# Patient Record
Sex: Male | Born: 1940 | Race: White | Hispanic: No | Marital: Married | Smoking: Former smoker
Health system: Southern US, Community
[De-identification: ages and names within clinical notes are randomized; demographics above are authoritative.]

## PROBLEM LIST (undated history)

## (undated) DIAGNOSIS — I499 Cardiac arrhythmia, unspecified: Secondary | ICD-10-CM

## (undated) DIAGNOSIS — M779 Enthesopathy, unspecified: Secondary | ICD-10-CM

## (undated) DIAGNOSIS — E785 Hyperlipidemia, unspecified: Secondary | ICD-10-CM

## (undated) DIAGNOSIS — Z87442 Personal history of urinary calculi: Secondary | ICD-10-CM

## (undated) DIAGNOSIS — I498 Other specified cardiac arrhythmias: Secondary | ICD-10-CM

## (undated) DIAGNOSIS — N500 Atrophy of testis: Secondary | ICD-10-CM

## (undated) DIAGNOSIS — T8859XA Other complications of anesthesia, initial encounter: Secondary | ICD-10-CM

## (undated) DIAGNOSIS — N529 Male erectile dysfunction, unspecified: Secondary | ICD-10-CM

## (undated) DIAGNOSIS — Z860100 Personal history of colon polyps, unspecified: Secondary | ICD-10-CM

## (undated) DIAGNOSIS — U071 COVID-19: Secondary | ICD-10-CM

## (undated) DIAGNOSIS — T4145XA Adverse effect of unspecified anesthetic, initial encounter: Secondary | ICD-10-CM

## (undated) DIAGNOSIS — K635 Polyp of colon: Secondary | ICD-10-CM

## (undated) DIAGNOSIS — M1990 Unspecified osteoarthritis, unspecified site: Secondary | ICD-10-CM

## (undated) DIAGNOSIS — K5792 Diverticulitis of intestine, part unspecified, without perforation or abscess without bleeding: Secondary | ICD-10-CM

## (undated) DIAGNOSIS — D126 Benign neoplasm of colon, unspecified: Secondary | ICD-10-CM

## (undated) DIAGNOSIS — K37 Unspecified appendicitis: Secondary | ICD-10-CM

## (undated) DIAGNOSIS — K5720 Diverticulitis of large intestine with perforation and abscess without bleeding: Secondary | ICD-10-CM

## (undated) DIAGNOSIS — G7000 Myasthenia gravis without (acute) exacerbation: Secondary | ICD-10-CM

## (undated) DIAGNOSIS — R011 Cardiac murmur, unspecified: Secondary | ICD-10-CM

## (undated) DIAGNOSIS — K5790 Diverticulosis of intestine, part unspecified, without perforation or abscess without bleeding: Secondary | ICD-10-CM

## (undated) DIAGNOSIS — G709 Myoneural disorder, unspecified: Secondary | ICD-10-CM

## (undated) DIAGNOSIS — R002 Palpitations: Secondary | ICD-10-CM

## (undated) HISTORY — PX: HEMORRHOID SURGERY: SHX153

## (undated) HISTORY — DX: Unspecified osteoarthritis, unspecified site: M19.90

## (undated) HISTORY — DX: Myasthenia gravis without (acute) exacerbation: G70.00

## (undated) HISTORY — DX: Benign neoplasm of colon, unspecified: D12.6

## (undated) HISTORY — DX: Male erectile dysfunction, unspecified: N52.9

## (undated) HISTORY — DX: Diverticulosis of intestine, part unspecified, without perforation or abscess without bleeding: K57.90

## (undated) HISTORY — DX: Enthesopathy, unspecified: M77.9

## (undated) HISTORY — PX: FLEXIBLE SIGMOIDOSCOPY: SHX1649

## (undated) HISTORY — DX: COVID-19: U07.1

## (undated) HISTORY — DX: Palpitations: R00.2

## (undated) HISTORY — DX: Personal history of colonic polyps: Z86.010

## (undated) HISTORY — DX: Personal history of colon polyps, unspecified: Z86.0100

## (undated) HISTORY — DX: Polyp of colon: K63.5

## (undated) HISTORY — DX: Atrophy of testis: N50.0

---

## 2001-01-30 ENCOUNTER — Encounter (INDEPENDENT_AMBULATORY_CARE_PROVIDER_SITE_OTHER): Payer: Self-pay | Admitting: *Deleted

## 2001-01-30 ENCOUNTER — Ambulatory Visit (HOSPITAL_COMMUNITY): Admission: RE | Admit: 2001-01-30 | Discharge: 2001-01-30 | Payer: Self-pay | Admitting: Gastroenterology

## 2003-10-30 HISTORY — PX: INGUINAL HERNIA REPAIR: SHX194

## 2004-09-29 ENCOUNTER — Ambulatory Visit (HOSPITAL_COMMUNITY): Admission: RE | Admit: 2004-09-29 | Discharge: 2004-09-29 | Payer: Self-pay | Admitting: General Surgery

## 2006-10-29 HISTORY — PX: KNEE ARTHROSCOPY: SUR90

## 2008-10-29 HISTORY — PX: CARDIAC ELECTROPHYSIOLOGY MAPPING AND ABLATION: SHX1292

## 2010-08-17 ENCOUNTER — Ambulatory Visit (HOSPITAL_COMMUNITY): Admission: RE | Admit: 2010-08-17 | Discharge: 2010-08-17 | Payer: Self-pay | Admitting: Pediatrics

## 2010-12-06 ENCOUNTER — Encounter: Payer: Self-pay | Admitting: Internal Medicine

## 2010-12-19 ENCOUNTER — Encounter: Payer: Self-pay | Admitting: Internal Medicine

## 2011-01-11 ENCOUNTER — Encounter: Payer: Self-pay | Admitting: Internal Medicine

## 2011-01-11 ENCOUNTER — Ambulatory Visit (INDEPENDENT_AMBULATORY_CARE_PROVIDER_SITE_OTHER): Payer: Medicare Other | Admitting: Internal Medicine

## 2011-01-11 ENCOUNTER — Other Ambulatory Visit: Payer: Self-pay | Admitting: Internal Medicine

## 2011-01-11 DIAGNOSIS — I498 Other specified cardiac arrhythmias: Secondary | ICD-10-CM

## 2011-01-11 DIAGNOSIS — I471 Supraventricular tachycardia: Secondary | ICD-10-CM

## 2011-01-11 DIAGNOSIS — Z01810 Encounter for preprocedural cardiovascular examination: Secondary | ICD-10-CM

## 2011-01-11 HISTORY — DX: Other specified cardiac arrhythmias: I49.8

## 2011-01-11 LAB — BASIC METABOLIC PANEL
BUN: 15 mg/dL (ref 6–23)
CO2: 27 mEq/L (ref 19–32)
Calcium: 9.2 mg/dL (ref 8.4–10.5)
Chloride: 108 mEq/L (ref 96–112)
Creatinine, Ser: 1 mg/dL (ref 0.4–1.5)
GFR: 81.01 mL/min (ref >60.00)
Glucose, Bld: 81 mg/dL (ref 70–99)
Potassium: 4 mEq/L (ref 3.5–5.1)
Sodium: 142 mEq/L (ref 135–145)

## 2011-01-11 LAB — CBC WITH DIFFERENTIAL/PLATELET
Basophils Absolute: 0 10*3/uL (ref 0.0–0.1)
Basophils Relative: 0.2 % (ref 0.0–3.0)
Eosinophils Absolute: 0.2 10*3/uL (ref 0.0–0.7)
Eosinophils Relative: 2.9 % (ref 0.0–5.0)
HCT: 43.6 % (ref 39.0–52.0)
Hemoglobin: 14.9 g/dL (ref 13.0–17.0)
Lymphocytes Relative: 22.2 % (ref 12.0–46.0)
Lymphs Abs: 1.8 10*3/uL (ref 0.7–4.0)
MCHC: 34.2 g/dL (ref 30.0–36.0)
MCV: 89 fl (ref 78.0–100.0)
Monocytes Absolute: 0.6 10*3/uL (ref 0.1–1.0)
Monocytes Relative: 8.1 % (ref 3.0–12.0)
Neutro Abs: 5.4 10*3/uL (ref 1.4–7.7)
Neutrophils Relative %: 66.6 % (ref 43.0–77.0)
Platelets: 251 10*3/uL (ref 150.0–400.0)
RBC: 4.9 Mil/uL (ref 4.22–5.81)
RDW: 14.3 % (ref 11.5–14.6)
WBC: 8 10*3/uL (ref 4.5–10.5)

## 2011-01-11 LAB — PROTIME-INR
INR: 1.1 ratio — ABNORMAL HIGH (ref 0.8–1.0)
Prothrombin Time: 12.7 s — ABNORMAL HIGH (ref 10.2–12.4)

## 2011-01-11 LAB — APTT: aPTT: 29.2 s — ABNORMAL HIGH (ref 21.7–28.8)

## 2011-01-15 ENCOUNTER — Observation Stay (HOSPITAL_COMMUNITY)
Admission: RE | Admit: 2011-01-15 | Discharge: 2011-01-15 | Disposition: A | Discharge status: Home / Self Care | Payer: Medicare Other | Source: Ambulatory Visit | Attending: Internal Medicine | Admitting: Internal Medicine

## 2011-01-15 DIAGNOSIS — M1710 Unilateral primary osteoarthritis, unspecified knee: Secondary | ICD-10-CM | POA: Insufficient documentation

## 2011-01-15 DIAGNOSIS — Z7982 Long term (current) use of aspirin: Secondary | ICD-10-CM | POA: Insufficient documentation

## 2011-01-15 DIAGNOSIS — K5730 Diverticulosis of large intestine without perforation or abscess without bleeding: Secondary | ICD-10-CM | POA: Insufficient documentation

## 2011-01-15 DIAGNOSIS — N529 Male erectile dysfunction, unspecified: Secondary | ICD-10-CM | POA: Insufficient documentation

## 2011-01-15 DIAGNOSIS — I471 Supraventricular tachycardia: Secondary | ICD-10-CM

## 2011-01-15 DIAGNOSIS — E785 Hyperlipidemia, unspecified: Secondary | ICD-10-CM | POA: Insufficient documentation

## 2011-01-15 DIAGNOSIS — Z79899 Other long term (current) drug therapy: Secondary | ICD-10-CM | POA: Insufficient documentation

## 2011-01-15 DIAGNOSIS — I498 Other specified cardiac arrhythmias: Principal | ICD-10-CM | POA: Insufficient documentation

## 2011-01-16 ENCOUNTER — Telehealth: Payer: Self-pay | Admitting: Internal Medicine

## 2011-01-16 NOTE — Assessment & Plan Note (Signed)
Summary: np6/paroxysmal svt/medicare/dr.varanasi per linda [id number]/mj   Visit Type:  Initial Consult  CC:  Parosymal svt.  History of Present Illness: [name] is referred today by Dr. Eldridge Dace for evaluation and treatment of SVT. He has had a several year h/o palpitations and has had document episodes of SVT at nearly 200 beats a minute. The patient has never had frank syncope but does not dizziness just before his episodes occur. He has minimal chest pressure and sob. No edema. He has been treated with metoprolol with no improvement and he feels fatigue on metoprolol. No other complaints today.    Current Medications (verified): 1)  Metoprolol Succinate 50 Mg Xr24h-Tab (Metoprolol Succinate) .... Take One Tablet By Mouth Daily 2)  Omeprazole 20 Mg Tbec (Omeprazole) .... Take 1 Tablet By Mouth Once A Day 3)  Aspirin 81 Mg Tbec (Aspirin) .... Take One Tablet By Mouth Daily 4)  Multivitamins  Tabs (Multiple Vitamin) .... Take 1 Tablet By Mouth Once A Day 5)  Fish Oil 1000 Mg Caps (Omega-3 Fatty Acids) .... Take 1 Capsule By Mouth Once A Day 6)  Calcium 600+d Plus Minerals 600-400 Mg-Unit Tabs (Calcium Carbonate-Vit D-Min) .... Take 1 Tablet By Mouth Once A Day 7)  Glucosamine Chondroitin Complx  Caps (Glucosamine-Chondroit-Vit C-Mn) .... 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 Sikora Right testicular atrophy erectile dysfunction colon polyp history extensive diverticulosis 9/11 monitor for palpitation with SVT rate up to 150  Family History: Last updated: 01/11/2011 No premature CAD  Social History: Last updated: 01/10/2011 former smoker-quit 9 years ago alcohol yes caffeine yes no drugs excercise- walks occupation -Airline pilot retired  Marital  status- marrried Radiographer, therapeutic  Family History: No premature CAD  Review of Systems       All systems reviewed and negative except as noted in the HPI.  Vital Signs:  Patient profile:   70 year old male Height:      70 inches Weight:      205.50 pounds BMI:     29.59 Pulse rate:   69 / minute Pulse rhythm:   regular Resp:     18 per minute BP sitting:   110 / 72  (right arm) Cuff size:   large  Vitals Entered By: Vikki Ports (January 11, 2011 2:18 PM)  Physical Exam  General:  Well developed, well nourished man, in no acute distress.  HEENT: normal Neck: supple. No JVD. Carotids 2+ bilaterally no bruits Cor: RRR no rubs, gallops or murmur Lungs: CTA Ab: soft, nontender. nondistended. No HSM. Good bowel sounds Ext: warm. no cyanosis, clubbing or edema Neuro: alert and oriented. Grossly nonfocal. affect pleasant    EKG  Procedure date:  01/11/2011  Findings:      Normal sinus rhythm with rate of:  69.  Impression & Recommendations:  Problem # 1:  SUPRAVENTRICULAR TACHYCARDIA (ICD-427.89) I have discussed the treatment options and the risks/benefits/goals/expectations of EPS/RFA of SVT have been discussed with the patient and he wishes to proceed. His updated medication list for this problem includes:    Metoprolol Succinate 50 Mg Xr24h-tab (Metoprolol succinate) .Marland Kitchen... Take one tablet by mouth daily    Aspirin 81 Mg Tbec (Aspirin) .Marland Kitchen... Take one tablet by mouth daily  Orders: EKG w/ Interpretation (93000) TLB-BMP (Basic Metabolic Panel-BMET) (80048-METABOL) TLB-CBC  Platelet - w/Differential (85025-CBCD) TLB-PT (Protime) (85610-PTP) TLB-PTT (85730-PTTL)  Patient Instructions: 1)  Your physician recommends that you return for lab work in: TODAY BMET CBC PT PTT V72.81 2)  Your physician recommends that you continue on your current medications as directed. Please refer to the Current Medication list given to you today. 3)  Your physician has recommended that you  have an ablation.  Catheter ablation is a medical procedure used to treat some cardiac arrhythmias (irregular heartbeats). During catheter ablation, a long, thin, flexible tube is put into a blood vessel in your groin (upper thigh), or neck. This tube is called an ablation catheter. It is then guided to your heart through the blood vessel. Radiofrequency waves destroy small areas of heart tissue where abnormal heartbeats may cause an arrhythmia to start.  Please see the instruction sheet given to you today. 4)  You have been diagnosed with SVT (supraventricular tachycardia).  SVT is a rapid heartbeat that begins in the upper chambers of the heart. This is usually not a life threatening condition. Please see the handout/brochure given to you today for more information.

## 2011-01-16 NOTE — Telephone Encounter (Signed)
Called and spoke with patient.  He is having some rapid heart rates at night.  I explained to him that this is normal after an ablation to have some episodes during healing.  He has a follow up appointment 02/15/11.  He will call me back if it starts to happen during the day and we can restart the Metoprolol during the healing phase

## 2011-01-16 NOTE — Consult Note (Signed)
Summary: Lendon Colonel   Imported By: Marylou Mccoy 01/10/2011 16:30:00  _____________________________________________________________________  External Attachment:    Type:   Image     Comment:   External Document

## 2011-01-16 NOTE — Letter (Signed)
Summary: ELectrophysiology/Ablation Procedure Instructions  [facility], Main Office  [address]   [place], [place] [address]   Phone: [phone]  Fax: [phone]     Electrophysiology/Ablation Procedure Instructions    You are scheduled for a(n) ______SVT ABLATION _________ on ________3/19/12___ at ______7:30 AM______ with Dr. ___TAYLOR___________.  1.  Please come to the Short Stay Center at [facility] at ___5:30 AM______ on the day of your procedure.  2.  Come prepared to stay overnight.   Please bring your insurance cards and a list of your medications.  3.  Come to the [place] office on ______TODAY______ for lab work.  The lab at [facility] is open from 8:30 AM to 1:30 PM and 2:30 PM to 5:00 PM.  The lab at [facility] is open from 7:30 AM to 5:30 PM.  You do not have to be fasting.  4.  Do not have anything to eat or drink after midnight the night before your procedure.  5.  Do NOT take METOPROLOL 50 MG 2  days before your procedure unless otherwise instructed. All of your remaining medications may be taken with a small amount of water.  6.  Educational material received:  _____ EP   ____X_ Ablation   * Occasionally, EP studies and ablations can become lengthy.  Please make your family aware of this before your procedure starts.  Average time ranges from 2-8 hours for EP studies/ablations.  Your physician will locate your family after the procedure with the results.  * If you have any questions after you get home, please call the office at [phone].

## 2011-01-22 ENCOUNTER — Encounter: Payer: Self-pay | Admitting: Internal Medicine

## 2011-02-02 NOTE — Op Note (Signed)
[name], [name]              ACCOUNT NO.:  [id number]  MEDICAL RECORD NO.:  [id number]           PATIENT TYPE:  I  LOCATION:  3742                         FACILITY:  [name]  PHYSICIAN:  Doylene Canning. Ladona Ridgel, MD    DATE OF BIRTH:  January 11, 1941  DATE OF PROCEDURE:  01/15/2011 DATE OF DISCHARGE:  01/15/2011                              OPERATIVE REPORT   PROCEDURE PERFORMED:  Electrophysiologic study and radiofrequency catheter ablation of atrioventricular node reentrant tachycardia.  INTRODUCTION:  The patient is a 70 year old man who has a history of longstanding tachy palpitations refractory to medical therapy with beta- blockers.  The patient has had ventricular rates of over 150 beats per minute.  He is now referred for catheter ablation.  PROCEDURE:  After informed consent was obtained, the patient was taken to the diagnostic EP lab in fasting state.  After usual preparation and draping, intravenous fentanyl and midazolam were given for sedation.  A 6-French hexapolar catheter was inserted percutaneously into the right jugular vein and advanced to the coronary sinus.  A 6-French quadripolar catheter was inserted percutaneously into the right femoral vein and advanced to the right ventricle.  A 6-French quadripolar catheter was inserted percutaneously in the right femoral vein and advanced to His bundle region.  After measurement of basic intervals, rapid ventricular pacing was carried out from the right ventricle at a pacing cycle length of 600 milliseconds and stepwise decreased down to 350 milliseconds where VA Wenckebach was observed.  During rapid ventricular pacing, the atrial activation was midline and decremental.  Next, programmed ventricular stimulation was carried out from the right ventricle at base drive cycle length of 284 milliseconds.  The S1-S2 interval was stepwise decreased down to 330 milliseconds where ventricular refractoriness was observed.  During  programmed ventricular dilation, the activation sequence in the atrium was midline and decremental.  There were multiple VA jumps and VA echo beats.  Next, programmed atrial stimulation was carried out from the coronary sinus at a base drive cycle length of 132 milliseconds and stepwise decreased down to 330 milliseconds resulting in the initiation of SVT.  During SVT, the VA interval was very short and the atrial activation was midline.  PVCs placed at the time of His bundle refractoriness did not preexcite the atrium and during ventricular pacing, there was VAAV conduction sequence.  Next, rapid atrial pacing was carried out from the coronary sinus and stepwise decreased down to 330 milliseconds resulting in the initiation of SVT. During rapid atrial pacing, the PR interval was initially greater than the RR interval.  The patient's SVT could be readily terminated by rapid atrial pacing at 300-320 milliseconds.  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  The 7-French quadripolar ablation catheter was then inserted percutaneously into the right femoral vein and advanced under fluoroscopic guidance into the right atrium.  Mapping of [name] triangle was carried out.  [name] triangle was unusually small.  In addition, the coronary sinus was superiorly displaced.  This made Koch triangle even smaller.  A total of 5 RF energy applications were delivered to [name] triangle sites 7  and 8 and during RF energy application, there was prolonged accelerated junctional rhythm.  After the fifth RF energy application, rapid atrial pacing failed to demonstrate any residual SVT and there were no residual slow pathway conduction.  After an observation of 1 hour where there was no recurrent SVT and no inducible SVT, the catheters were removed, hemostasis was assured, and the patient was returned to his room in satisfactory condition.  COMPLICATIONS:  There were no immediate  procedure complications.  RESULTS:  a.  Baseline ECG:  The baseline ECG demonstrates sinus rhythm with normal axis and intervals. b.  Baseline intervals:  The sinus node cycle length was 945 milliseconds, the QRS duration was 90 milliseconds, the HV interval was 55 milliseconds, and the AH interval was 81 milliseconds. c.  Rapid ventricular pacing:  Rapid ventricular pacing was carried out from the right ventricle and stepwise decreased down to 350 milliseconds where VA Wenckebach was observed.  During rapid ventricular pacing, the atrial activation sequence was midline and decremental. d.  Deep programmed ventricular stimulation:  Programmed ventricular stimulation was carried out from the right ventricle at base drive cycle length of 563 milliseconds.  The S1 and S2 interval stepwise decreased down to 260 milliseconds where ventricular refractoriness was observed. During programmed ventricular stimulation, the atrial activation sequence was midline and decremental. e.  Rapid atrial pacing:  Rapid atrial pacing was carried out from the coronary sinus and high right atrium pace at base drive cycle length of 875 milliseconds and stepwise decreased down to 360 milliseconds resulting in the initiation of SVT.  Following ablation, rapid atrial pacing demonstrated an AV Wenckebach cycle length varying between 390 and 420 milliseconds.  Prior to ablation, the PR interval was greater than the RR interval.  After ablation, the PR interval was less than the RR interval. f.  Programmed atrial stimulation:  Programmed atrial stimulation was carried out from the coronary sinus and high right atrium at base drive cycle length of 643 milliseconds and 500 milliseconds.  The S1 and S2 interval stepwise decreased down to 330 milliseconds where SVT was initiated.  Following ablation, the S1 and S2 interval was stepwise decreased down to 330 milliseconds where the AV node ERP was observed. Prior to  ablation, there were multiple AH jumps and echo beats along with inducible SVT.  During programmed atrial stimulation and following ablation, there were no residual echo beats and the PR interval was less than the RR interval and there was no inducible SVT. g.  Arrhythmias observed:  AV node reentry tachycardia initiation was with programmed atrial stimulation and rapid atrial pacing.  The duration was sustained.  Cycle length was 380 milliseconds.  Method of initiation with rapid atrial pacing and programmed atrial stimulation, duration was sustained.  Termination was with rapid atrial pacing. h.  Mapping.  Mapping of [name] triangle demonstrated an usually small and anteriorly displaced [employer]. 1. RF energy application:  A total of 5 RF energy applications were     delivered to sites 7 and 8 in [place] triangle resulting in     accelerated junctional rhythm and rendering the tachycardia     noninducible.  There was no residual slow pathway conduction.  CONCLUSION:  The study demonstrates successful electrophysiologic study and RF catheter ablation of AV node reentrant tachycardia with a total of 5 RF energy applications delivered to sites 7 and 8 in [place] triangle. Following catheter ablation, there was no inducible SVT nor was there any residual evidence of any  slow pathway conduction.     Doylene Canning. Ladona Ridgel, MD     GWT/MEDQ  D:  01/15/2011  T:  01/16/2011  Job:  [id number]  cc:   Corky Crafts, MD  Electronically Signed by Lewayne Bunting MD on 02/02/2011 06:47:17 AM

## 2011-02-14 ENCOUNTER — Encounter: Payer: Self-pay | Admitting: Internal Medicine

## 2011-02-14 ENCOUNTER — Encounter: Payer: Self-pay | Admitting: *Deleted

## 2011-02-15 ENCOUNTER — Encounter: Payer: Self-pay | Admitting: Internal Medicine

## 2011-02-15 ENCOUNTER — Ambulatory Visit (INDEPENDENT_AMBULATORY_CARE_PROVIDER_SITE_OTHER): Payer: Medicare Other | Admitting: Internal Medicine

## 2011-02-15 DIAGNOSIS — I498 Other specified cardiac arrhythmias: Secondary | ICD-10-CM

## 2011-02-15 DIAGNOSIS — R002 Palpitations: Secondary | ICD-10-CM

## 2011-02-15 NOTE — Progress Notes (Signed)
HPI [name] today for followup. He is a very pleasant 70 year old man with a history of SVT who underwent catheter ablation several weeks ago. Today he complains that every night he'll wake up and feel like his heart is racing. It stops within a few deep breaths and has not returned. He has had no daytime symptoms. He denies chest pain or shortness of breath. No syncope. He states that during the day it will feel like his heart is about to start racing but never does. Allergies not on file   Current Outpatient Prescriptions  Medication Sig Dispense Refill  . aspirin 81 MG EC tablet Take 81 mg by mouth daily.        . Calcium Carbonate-Vitamin D (CALCIUM 600+D) 600-400 MG-UNIT per tablet Take 1 tablet by mouth daily.        . fish oil-omega-3 fatty acids 1000 MG capsule Take 1 g by mouth daily.        Marland Kitchen GLUCOSAMINE-CHONDROITIN-VIT C PO Take 1 tablet by mouth daily.        . Multiple Vitamin (MULTIVITAMIN) tablet Take 1 tablet by mouth daily.        Marland Kitchen omeprazole (PRILOSEC) 20 MG capsule Take 20 mg by mouth daily.        Marland Kitchen DISCONTD: metoprolol (TOPROL-XL) 50 MG 24 hr tablet Take 50 mg by mouth daily.           Past Medical History  Diagnosis Date  . Osteoarthritis   . Tendinitis     achiles heel spur  . Testicular atrophy   . ED (erectile dysfunction)   . History of colon polyps   . Diverticulosis     extensive  . Palpitation     with svt rate 150    ROS:   All systems reviewed and negative except as noted in the HPI.   No past surgical history on file.   No family history on file.   History   Social History  . Marital Status: Married    Spouse Name: N/A    Number of Children: N/A  . 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 Smoking status: Former Smoker    Quit date: 02/13/2002  . Smokeless tobacco: Former Neurosurgeon  . Alcohol Use: Yes  . Drug Use: No  . Sexually Active: Not on file   Other Topics  Concern  . Not on file   Social History Narrative  . No narrative on file     BP 116/80  Pulse 83  Ht 6' (1.829 m)  Wt 203 lb (92.08 kg)  BMI 27.53 kg/m2  Physical Exam:  Well appearing NAD HEENT: Unremarkable Neck:  No JVD, no thyromegally Lymphatics:  No adenopathy Back:  No CVA tenderness Lungs:  Clear. HEART:  Regular rate rhythm, no murmurs, no rubs, no clicks Abd:  Flat, positive bowel sounds, no organomegally, no rebound, no guarding Ext:  2 plus pulses, no edema, no cyanosis, no clubbing Skin:  No rashes no nodules Neuro:  CN II through XII intact, motor grossly intact  EKG Normal sinus rhythm with incomplete right bundle branch block. Nonspecific T wave abnormality.  Assess/Plan:

## 2011-02-15 NOTE — Assessment & Plan Note (Signed)
No demonstrated recurrent SVT. I recommended a period of watchful waiting. He is no longer on a beta blocker. We'll follow

## 2011-02-15 NOTE — Patient Instructions (Signed)
Your physician recommends that you schedule a follow-up appointment as needed  

## 2011-02-15 NOTE — Assessment & Plan Note (Signed)
The patient continues to have nocturnal palpitations. It is unclear to me whether this represents recurrent tachycardia or something else. I discussed the possibility of wearing a cardiac monitor at the present time he feels like this would not be necessary. If he has any daytime palpitations of significance I have asked him to call us and we would recommend him wearing a monitor.

## 2011-02-26 NOTE — Discharge Summary (Signed)
  NAMEZAMEER, BORMAN              ACCOUNT NO.:  [id number]  MEDICAL RECORD NO.:  [id number]           PATIENT TYPE:  I  LOCATION:  3742                         FACILITY:  [facility]  PHYSICIAN:  Doylene Canning. Ladona Ridgel, MD    DATE OF BIRTH:  1941-05-25  DATE OF ADMISSION:  01/15/2011 DATE OF DISCHARGE:  01/15/2011                              DISCHARGE SUMMARY   PRIMARY CARDIOLOGIST:  Corky Crafts, MD  ELECTROPHYSIOLOGIST:  Doylene Canning. 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  SECONDARY DIAGNOSES: 1. Hyperlipidemia. 2. Erectile dysfunction. 3. Osteoarthritis of the knees. 4. History of Achilles tendonitis. 5. Right testicular atrophy. 6. Diverticulosis. 7. Remote tobacco abuse.  ALLERGIES:  No known drug allergies.  PROCEDURES:  Electrophysiologic study notable for inducible supraventricular tachycardia/AV nodal reentrant tachycardia and subsequent successful slow pathway ablation.  HISTORY OF PRESENT ILLNESS:  A 70 year old male with history of palpitations followed by Dr. Eldridge Dace who has recently undergone monitoring showing SVT.  Subsequently referred to see Dr. Ladona Ridgel on January 11, 2011, and decision was made to pursue SVT ablation.  HOSPITAL COURSE:  The patient presented to the [facility] EP lab on January 15, 2011, where he underwent electrophysiologic study with successful identification of supraventricular tachycardia/AV nodal reentrant tachycardia.  The patient then underwent successful slow pathway ablation with 5 radiofrequency delivered energies.  The patient has maintained sinus rhythm and overall has tolerated procedure well. We are discontinuing his home beta-blocker and he will be discharged home this evening in good condition.  DISCHARGE LABS:  Hemoglobin 14.9, hematocrit 43.6, WBC 8.0, platelets 251, INR 1.1.  Sodium 142, potassium 4.0, chloride 108, CO2 of 27, BUN 15, creatinine 1.0,  glucose 81, calcium 9.2.  DISPOSITION:  The patient will be discharged home today in good condition.  FOLLOWUP PLANS AND APPOINTMENTS:  We will arrange followup with Dr. Ladona Ridgel in approximately 4 weeks.  He will follow up with Dr. Eldridge Dace as scheduled.  DISCHARGE MEDICATIONS: 1. Aspirin 81 mg daily. 2. Calcium plus D 1 tablet daily. 3. Fish oil over the counter 1 capsule daily. 4. Glucosamine/chondroitin 1 capsule daily. 5. Multivitamin daily. 6. Omeprazole 20 mg daily.  OUTSTANDING LABS AND STUDIES:  None.  Duration of discharge encounter 35 minutes including physician time.     Nicolasa Ducking, ANP   ______________________________ Doylene Canning. Ladona Ridgel, MD    CB/MEDQ  D:  01/15/2011  T:  01/16/2011  Job:  [id number]  cc:   Corky Crafts, MD  Electronically Signed by Nicolasa Ducking ANP on 02/06/2011 04:03:38 PM Electronically Signed by Lewayne Bunting MD on 02/26/2011 07:57:29 AM

## 2011-03-16 NOTE — Op Note (Signed)
[name], [name] NO.:  [id number]   MEDICAL RECORD NO.:  [id number]          PATIENT TYPE:  OIB   LOCATION:  2899                         FACILITY:  [facility]   PHYSICIAN:  Leonie Man, M.D.   DATE OF BIRTH:  [birth year]   DATE OF PROCEDURE:  09/29/2004  DATE OF DISCHARGE:  09/29/2004                                 OPERATIVE REPORT   PREOPERATIVE DIAGNOSIS:  Bilateral inguinal hernias.   POSTOPERATIVE DIAGNOSIS:  Bilateral inguinal hernias.   PROCEDURES:  Repair of bilateral inguinal hernias with mesh.   SURGEON:  Leonie Man, M.D.   ASSISTANT:  Nurse.   ANESTHESIA:  General.   INDICATIONS:  [name] is a 70 year old gentleman presenting with a right-  sided inguinal hernia and on evaluation noted to have bilateral inguinal  hernias.  He comes to the operating room after the risks and potential  benefits of surgery have been fully discussed.  All questions have been  answered and consent for surgery obtained.   DESCRIPTION OF PROCEDURE:  Following the induction of satisfactory general  anesthesia with the patient positioned supinely, the abdomen was prepped and  draped to be included in the sterile operative field.  I outlined incisions  in both the right and left groin and infiltrated the right groin with 0.5%  Marcaine with epinephrine.  A transverse incision in the lower abdominal  crease was carried down through the skin and subcutaneous tissues,  dissecting down to the external oblique aponeurosis.  This was opened up  through the external inguinal ring with protection of the ilioinguinal  nerve.  The spermatic cord was elevated and held with a Penrose drain.  A  large direct hernia sac and cord lipoma was dissected free from the cord and  carried up to the internal ring.  The sac, as well as the lipoma are clamped  and suture ligated with 2-0 silk sutures.  The floor of the inguinal canal  was then repaired with an onlay patch of  polypropylene mesh sewn in at the  pubic tubercle and carried up along the conjoined tendon with a 2-0 Novofil  and again from the pubic tubercle along the shelving edge of Poupart's  ligament up to the internal ring.  The mesh was split so as to allow normal  protrusion of the cord through the newly formed internal ring.  The mesh was  then sutured down in behind the cord and the internal oblique muscles.  The  area of dissection was checked for hemostasis and noted to be dry.  Sponge  and instrument counts were verified.  The external oblique aponeurosis was  closed over the cord so as to reapproximate the external ring.  This was  accomplished with a running 2-0 Vicryl suture.  Scarpa's fascia was closed  with a running 3-0 Vicryl suture and the skin was closed with a running 4-0  Monocryl suture.   Attention was then turned to the left side where a symmetrically placed  incision was made and deepened through the skin and subcutaneous tissue,  dissecting down to the external oblique aponeurosis.  Again this was opened  up through the external inguinal ring with protection of the ilioinguinal  nerve.  The spermatic cord was elevated.  A similar hernia was noted on the  left side which was dissected away from the cord and carried up to the  internal ring.  The lipoma and the sac were dissected free, cross clamped  and suture ligated with 2-0 silk sutures.  The floor of the inguinal canal  was then repaired with a patch of polypropylene mesh which was sewn in at  the pubic tubercle with 2-0 Novofil carrying the running suture up along the  conjoined tendon to the internal ring and again from the pubic tubercle up  along the shelving edge of Poupart's ligament to the internal ring.  The  mesh was split, therefore allowing the cord to protrude through the newly  formed internal ring.  Tails of the mesh were sutured down behind the cord.  The hernia repair was noted to be intact.  Sponge and  instrument counts were  verified.  The external oblique aponeurosis was closed over the cord with a  running 2-0 Vicryl suture.  The Scarpa's fascia was closed with a running 3-  0 Vicryl suture.  The skin was closed with a running 4-0 Monocryl suture.  Both wounds were then reinforced with Steri-Strips.  Sterile dressings were  applied.  The anesthetic was reversed.  The patient was removed from the  operating room to the recovery room in stable condition.  He tolerated the  procedure well.      Patr   PB/MEDQ  D:  09/29/2004  T:  10/01/2004  Job:  [id number]   cc:   Maryla Morrow. Modesto Charon, M.D.  [address]  [place]  [place] [address]  Fax: [phone]

## 2011-03-16 NOTE — Procedures (Signed)
[place]  Patient:    [name], [name]                       MRN: [id number] Proc. Date: 01/30/01 Attending:  Verlin Grills, M.D. CC:         Redmond Baseman, M.D.   Procedure Report  DATE OF BIRTH:  05/05/1941  REFERRING PHYSICIAN:  Redmond Baseman, M.D.  PROCEDURE PERFORMED:  Colonoscopy.  ENDOSCOPIST:  Verlin Grills, M.D.  INDICATIONS FOR PROCEDURE:  The patient is a 70 year old male.  On July 02, 1996, [name] underwent a diagnostic colonoscopy at [facility] to evaluate painless hematochezia.  From the midrectum, a 4 mm tubular adenomatous polyp was removed.  From the sigmoid colon, three 1 mm hyperplastic polyps were removed.  [name] is due for surveillance colonoscopy and polypectomy to prevent colon cancer.  I discussed with the patient the complications associated with colonoscopy and polypectomy including intestinal bleeding and intestinal perforation.  The patient has signed the operative permit.  PREMEDICATION:  Fentanyl 50 mcg, Versed 6 mg.  ENDOSCOPE:  Olympus pediatric video colonoscope.  DESCRIPTION OF PROCEDURE:  After obtaining informed consent, the patient was placed in the left lateral decubitus position.  I administered intravenous Demerol and intravenous Versed to achieve conscious sedation for the procedure.  The patients blood pressure, oxygen saturation and cardiac rhythm were monitored throughout the procedure and documented in the medical record.  Anal inspection was normal.  Digital rectal exam revealed an enlarged but non-nodular prostate.  The Olympus pediatric video colonoscope was then introduced into the rectum and under direct vision, easily advanced to the cecum as identified by a normal-appearing ileocecal valve.  Colonic preparation for the exam today was excellent.  Rectum and sigmoid colon:  From the midrectum, a 0.5 mm sessile  polyp was removed with the hot biopsy forceps.  From the distal sigmoid colon, at 25 cm from the anal verge, a 1 mm sessile polyp was removed with the hot biopsy forceps.  Both polyps were submitted in one bottle for pathological evaluation.  Sigmoid colonic diverticulosis present.  Descending colon:  Colonic diverticulosis.  Splenic flexure:  Normal.  Transverse colon:  Normal.  Hepatic flexure:  Normal.  Ascending colon:  Normal.  Cecum and ileocecal valve:  Normal.  ASSESSMENT: 1. Extensive left colonic diverticulosis. 2. From the distal sigmoid colon, at 25 cm from the anal verge, a 1 mm    sessile polyp was removed with the hot biopsy forceps; from the midrectum    a 0.5 mm sessile polyp was removed with the hot biopsy forceps.  RECOMMENDATIONS:  Repeat colonoscopy in April 2007. DD:  01/30/01 TD:  01/30/01 Job: [id number]

## 2011-03-23 ENCOUNTER — Encounter: Payer: Self-pay | Admitting: Internal Medicine

## 2011-10-18 ENCOUNTER — Other Ambulatory Visit: Payer: Self-pay | Admitting: Family Medicine

## 2011-10-18 DIAGNOSIS — R222 Localized swelling, mass and lump, trunk: Secondary | ICD-10-CM

## 2011-10-19 ENCOUNTER — Ambulatory Visit
Admission: RE | Admit: 2011-10-19 | Discharge: 2011-10-19 | Disposition: A | Discharge status: Home / Self Care | Payer: Medicare Other | Source: Ambulatory Visit | Attending: Family Medicine | Admitting: Family Medicine

## 2011-10-19 DIAGNOSIS — R222 Localized swelling, mass and lump, trunk: Secondary | ICD-10-CM

## 2011-10-24 ENCOUNTER — Other Ambulatory Visit: Payer: Self-pay | Admitting: Family Medicine

## 2011-10-24 DIAGNOSIS — N6310 Unspecified lump in the right breast, unspecified quadrant: Secondary | ICD-10-CM

## 2011-11-06 ENCOUNTER — Ambulatory Visit
Admission: RE | Admit: 2011-11-06 | Discharge: 2011-11-06 | Disposition: A | Discharge status: Home / Self Care | Payer: Medicare Other | Source: Ambulatory Visit | Attending: Family Medicine | Admitting: Family Medicine

## 2011-11-06 DIAGNOSIS — N6310 Unspecified lump in the right breast, unspecified quadrant: Secondary | ICD-10-CM

## 2011-11-06 DIAGNOSIS — N63 Unspecified lump in unspecified breast: Secondary | ICD-10-CM | POA: Diagnosis not present

## 2012-06-02 DIAGNOSIS — R002 Palpitations: Secondary | ICD-10-CM | POA: Diagnosis not present

## 2012-06-02 DIAGNOSIS — E782 Mixed hyperlipidemia: Secondary | ICD-10-CM | POA: Diagnosis not present

## 2012-06-10 DIAGNOSIS — L821 Other seborrheic keratosis: Secondary | ICD-10-CM | POA: Diagnosis not present

## 2012-06-10 DIAGNOSIS — L570 Actinic keratosis: Secondary | ICD-10-CM | POA: Diagnosis not present

## 2012-06-15 DIAGNOSIS — R350 Frequency of micturition: Secondary | ICD-10-CM | POA: Diagnosis not present

## 2012-06-15 DIAGNOSIS — N400 Enlarged prostate without lower urinary tract symptoms: Secondary | ICD-10-CM | POA: Diagnosis not present

## 2012-07-07 DIAGNOSIS — Z86010 Personal history of colonic polyps: Secondary | ICD-10-CM | POA: Diagnosis not present

## 2012-07-07 DIAGNOSIS — Z125 Encounter for screening for malignant neoplasm of prostate: Secondary | ICD-10-CM | POA: Diagnosis not present

## 2012-07-07 DIAGNOSIS — I498 Other specified cardiac arrhythmias: Secondary | ICD-10-CM | POA: Diagnosis not present

## 2012-07-07 DIAGNOSIS — N400 Enlarged prostate without lower urinary tract symptoms: Secondary | ICD-10-CM | POA: Diagnosis not present

## 2012-07-07 DIAGNOSIS — E785 Hyperlipidemia, unspecified: Secondary | ICD-10-CM | POA: Diagnosis not present

## 2012-07-07 DIAGNOSIS — Z23 Encounter for immunization: Secondary | ICD-10-CM | POA: Diagnosis not present

## 2012-07-07 DIAGNOSIS — Z79899 Other long term (current) drug therapy: Secondary | ICD-10-CM | POA: Diagnosis not present

## 2012-09-26 DIAGNOSIS — J069 Acute upper respiratory infection, unspecified: Secondary | ICD-10-CM | POA: Diagnosis not present

## 2012-09-29 DIAGNOSIS — L299 Pruritus, unspecified: Secondary | ICD-10-CM | POA: Diagnosis not present

## 2012-10-09 DIAGNOSIS — H04129 Dry eye syndrome of unspecified lacrimal gland: Secondary | ICD-10-CM | POA: Diagnosis not present

## 2012-10-09 DIAGNOSIS — H2510 Age-related nuclear cataract, unspecified eye: Secondary | ICD-10-CM | POA: Diagnosis not present

## 2012-11-03 DIAGNOSIS — Z125 Encounter for screening for malignant neoplasm of prostate: Secondary | ICD-10-CM | POA: Diagnosis not present

## 2012-11-03 DIAGNOSIS — Z1331 Encounter for screening for depression: Secondary | ICD-10-CM | POA: Diagnosis not present

## 2012-11-03 DIAGNOSIS — E785 Hyperlipidemia, unspecified: Secondary | ICD-10-CM | POA: Diagnosis not present

## 2012-11-03 DIAGNOSIS — R030 Elevated blood-pressure reading, without diagnosis of hypertension: Secondary | ICD-10-CM | POA: Diagnosis not present

## 2012-11-03 DIAGNOSIS — Z23 Encounter for immunization: Secondary | ICD-10-CM | POA: Diagnosis not present

## 2013-02-09 DIAGNOSIS — E785 Hyperlipidemia, unspecified: Secondary | ICD-10-CM | POA: Diagnosis not present

## 2013-02-09 DIAGNOSIS — Z79899 Other long term (current) drug therapy: Secondary | ICD-10-CM | POA: Diagnosis not present

## 2013-07-02 DIAGNOSIS — Z13820 Encounter for screening for osteoporosis: Secondary | ICD-10-CM | POA: Diagnosis not present

## 2013-07-02 DIAGNOSIS — Z86010 Personal history of colonic polyps: Secondary | ICD-10-CM | POA: Diagnosis not present

## 2013-07-02 DIAGNOSIS — E785 Hyperlipidemia, unspecified: Secondary | ICD-10-CM | POA: Diagnosis not present

## 2013-07-02 DIAGNOSIS — Z79899 Other long term (current) drug therapy: Secondary | ICD-10-CM | POA: Diagnosis not present

## 2013-07-02 DIAGNOSIS — K219 Gastro-esophageal reflux disease without esophagitis: Secondary | ICD-10-CM | POA: Diagnosis not present

## 2013-07-02 DIAGNOSIS — I498 Other specified cardiac arrhythmias: Secondary | ICD-10-CM | POA: Diagnosis not present

## 2013-07-02 DIAGNOSIS — N400 Enlarged prostate without lower urinary tract symptoms: Secondary | ICD-10-CM | POA: Diagnosis not present

## 2013-07-07 DIAGNOSIS — L821 Other seborrheic keratosis: Secondary | ICD-10-CM | POA: Diagnosis not present

## 2013-07-07 DIAGNOSIS — L570 Actinic keratosis: Secondary | ICD-10-CM | POA: Diagnosis not present

## 2013-07-07 DIAGNOSIS — D239 Other benign neoplasm of skin, unspecified: Secondary | ICD-10-CM | POA: Diagnosis not present

## 2013-08-03 DIAGNOSIS — Z23 Encounter for immunization: Secondary | ICD-10-CM | POA: Diagnosis not present

## 2013-10-06 DIAGNOSIS — M25569 Pain in unspecified knee: Secondary | ICD-10-CM | POA: Diagnosis not present

## 2013-10-06 DIAGNOSIS — M23359 Other meniscus derangements, posterior horn of lateral meniscus, unspecified knee: Secondary | ICD-10-CM | POA: Diagnosis not present

## 2013-10-15 DIAGNOSIS — H04129 Dry eye syndrome of unspecified lacrimal gland: Secondary | ICD-10-CM | POA: Diagnosis not present

## 2013-10-15 DIAGNOSIS — H0230 Blepharochalasis unspecified eye, unspecified eyelid: Secondary | ICD-10-CM | POA: Diagnosis not present

## 2013-10-15 DIAGNOSIS — H2510 Age-related nuclear cataract, unspecified eye: Secondary | ICD-10-CM | POA: Diagnosis not present

## 2013-10-15 DIAGNOSIS — H1045 Other chronic allergic conjunctivitis: Secondary | ICD-10-CM | POA: Diagnosis not present

## 2013-10-20 DIAGNOSIS — M1710 Unilateral primary osteoarthritis, unspecified knee: Secondary | ICD-10-CM | POA: Diagnosis not present

## 2013-10-20 DIAGNOSIS — M7750 Other enthesopathy of unspecified foot: Secondary | ICD-10-CM | POA: Diagnosis not present

## 2013-12-29 DIAGNOSIS — R1032 Left lower quadrant pain: Secondary | ICD-10-CM | POA: Diagnosis not present

## 2013-12-29 DIAGNOSIS — L0390 Cellulitis, unspecified: Secondary | ICD-10-CM | POA: Diagnosis not present

## 2013-12-29 DIAGNOSIS — E785 Hyperlipidemia, unspecified: Secondary | ICD-10-CM | POA: Diagnosis not present

## 2013-12-29 DIAGNOSIS — N200 Calculus of kidney: Secondary | ICD-10-CM | POA: Diagnosis not present

## 2013-12-29 DIAGNOSIS — K5732 Diverticulitis of large intestine without perforation or abscess without bleeding: Secondary | ICD-10-CM | POA: Diagnosis not present

## 2013-12-29 DIAGNOSIS — L0291 Cutaneous abscess, unspecified: Secondary | ICD-10-CM | POA: Diagnosis not present

## 2013-12-29 DIAGNOSIS — Z87891 Personal history of nicotine dependence: Secondary | ICD-10-CM | POA: Diagnosis not present

## 2013-12-29 DIAGNOSIS — Z7982 Long term (current) use of aspirin: Secondary | ICD-10-CM | POA: Diagnosis not present

## 2013-12-29 DIAGNOSIS — K630 Abscess of intestine: Secondary | ICD-10-CM | POA: Diagnosis not present

## 2013-12-29 DIAGNOSIS — N400 Enlarged prostate without lower urinary tract symptoms: Secondary | ICD-10-CM | POA: Diagnosis not present

## 2013-12-29 DIAGNOSIS — R52 Pain, unspecified: Secondary | ICD-10-CM | POA: Diagnosis not present

## 2014-01-14 DIAGNOSIS — Z23 Encounter for immunization: Secondary | ICD-10-CM | POA: Diagnosis not present

## 2014-01-14 DIAGNOSIS — K5732 Diverticulitis of large intestine without perforation or abscess without bleeding: Secondary | ICD-10-CM | POA: Diagnosis not present

## 2014-01-15 ENCOUNTER — Other Ambulatory Visit: Payer: Self-pay | Admitting: Family Medicine

## 2014-01-15 DIAGNOSIS — K5732 Diverticulitis of large intestine without perforation or abscess without bleeding: Secondary | ICD-10-CM

## 2014-01-24 ENCOUNTER — Encounter: Payer: Self-pay | Admitting: *Deleted

## 2014-02-02 ENCOUNTER — Ambulatory Visit
Admission: RE | Admit: 2014-02-02 | Discharge: 2014-02-02 | Disposition: A | Discharge status: Home / Self Care | Payer: Medicare Other | Source: Ambulatory Visit | Attending: Family Medicine | Admitting: Family Medicine

## 2014-02-02 DIAGNOSIS — K5732 Diverticulitis of large intestine without perforation or abscess without bleeding: Secondary | ICD-10-CM

## 2014-02-02 DIAGNOSIS — N281 Cyst of kidney, acquired: Secondary | ICD-10-CM | POA: Diagnosis not present

## 2014-02-02 DIAGNOSIS — N200 Calculus of kidney: Secondary | ICD-10-CM | POA: Diagnosis not present

## 2014-02-02 DIAGNOSIS — K5730 Diverticulosis of large intestine without perforation or abscess without bleeding: Secondary | ICD-10-CM | POA: Diagnosis not present

## 2014-02-02 MED ORDER — IOHEXOL 300 MG/ML  SOLN
100.0000 mL | Freq: Once | INTRAMUSCULAR | Status: AC | PRN
  Administered 2014-02-02: 100 mL via INTRAVENOUS

## 2014-02-04 DIAGNOSIS — K5732 Diverticulitis of large intestine without perforation or abscess without bleeding: Secondary | ICD-10-CM | POA: Diagnosis not present

## 2014-02-19 DIAGNOSIS — K625 Hemorrhage of anus and rectum: Secondary | ICD-10-CM | POA: Diagnosis not present

## 2014-02-24 DIAGNOSIS — G619 Inflammatory polyneuropathy, unspecified: Secondary | ICD-10-CM | POA: Diagnosis not present

## 2014-02-24 DIAGNOSIS — G622 Polyneuropathy due to other toxic agents: Secondary | ICD-10-CM | POA: Diagnosis not present

## 2014-04-05 DIAGNOSIS — G622 Polyneuropathy due to other toxic agents: Secondary | ICD-10-CM | POA: Diagnosis not present

## 2014-04-05 DIAGNOSIS — G619 Inflammatory polyneuropathy, unspecified: Secondary | ICD-10-CM | POA: Diagnosis not present

## 2014-04-22 DIAGNOSIS — N401 Enlarged prostate with lower urinary tract symptoms: Secondary | ICD-10-CM | POA: Diagnosis not present

## 2014-04-22 DIAGNOSIS — N139 Obstructive and reflux uropathy, unspecified: Secondary | ICD-10-CM | POA: Diagnosis not present

## 2014-04-22 DIAGNOSIS — R350 Frequency of micturition: Secondary | ICD-10-CM | POA: Diagnosis not present

## 2014-04-22 DIAGNOSIS — N138 Other obstructive and reflux uropathy: Secondary | ICD-10-CM | POA: Diagnosis not present

## 2014-06-10 DIAGNOSIS — R972 Elevated prostate specific antigen [PSA]: Secondary | ICD-10-CM | POA: Diagnosis not present

## 2014-06-10 DIAGNOSIS — R350 Frequency of micturition: Secondary | ICD-10-CM | POA: Diagnosis not present

## 2014-06-10 DIAGNOSIS — N401 Enlarged prostate with lower urinary tract symptoms: Secondary | ICD-10-CM | POA: Diagnosis not present

## 2014-06-10 DIAGNOSIS — N139 Obstructive and reflux uropathy, unspecified: Secondary | ICD-10-CM | POA: Diagnosis not present

## 2014-07-01 DIAGNOSIS — G609 Hereditary and idiopathic neuropathy, unspecified: Secondary | ICD-10-CM | POA: Diagnosis not present

## 2014-07-01 DIAGNOSIS — K649 Unspecified hemorrhoids: Secondary | ICD-10-CM | POA: Diagnosis not present

## 2014-07-01 DIAGNOSIS — Z23 Encounter for immunization: Secondary | ICD-10-CM | POA: Diagnosis not present

## 2014-07-01 DIAGNOSIS — E782 Mixed hyperlipidemia: Secondary | ICD-10-CM | POA: Diagnosis not present

## 2014-07-01 DIAGNOSIS — Z79899 Other long term (current) drug therapy: Secondary | ICD-10-CM | POA: Diagnosis not present

## 2014-07-01 DIAGNOSIS — I498 Other specified cardiac arrhythmias: Secondary | ICD-10-CM | POA: Diagnosis not present

## 2014-07-01 DIAGNOSIS — D126 Benign neoplasm of colon, unspecified: Secondary | ICD-10-CM | POA: Diagnosis not present

## 2014-07-02 DIAGNOSIS — G619 Inflammatory polyneuropathy, unspecified: Secondary | ICD-10-CM | POA: Diagnosis not present

## 2014-07-25 DIAGNOSIS — M542 Cervicalgia: Secondary | ICD-10-CM | POA: Diagnosis not present

## 2014-07-26 DIAGNOSIS — N139 Obstructive and reflux uropathy, unspecified: Secondary | ICD-10-CM | POA: Diagnosis not present

## 2014-07-26 DIAGNOSIS — R972 Elevated prostate specific antigen [PSA]: Secondary | ICD-10-CM | POA: Diagnosis not present

## 2014-07-26 DIAGNOSIS — N401 Enlarged prostate with lower urinary tract symptoms: Secondary | ICD-10-CM | POA: Diagnosis not present

## 2014-07-26 DIAGNOSIS — N138 Other obstructive and reflux uropathy: Secondary | ICD-10-CM | POA: Diagnosis not present

## 2014-08-02 DIAGNOSIS — M542 Cervicalgia: Secondary | ICD-10-CM | POA: Diagnosis not present

## 2014-08-10 ENCOUNTER — Ambulatory Visit: Payer: Medicare Other | Attending: Family Medicine

## 2014-08-10 DIAGNOSIS — Z5189 Encounter for other specified aftercare: Secondary | ICD-10-CM | POA: Insufficient documentation

## 2014-08-10 DIAGNOSIS — M542 Cervicalgia: Secondary | ICD-10-CM | POA: Diagnosis not present

## 2014-08-10 DIAGNOSIS — R293 Abnormal posture: Secondary | ICD-10-CM | POA: Diagnosis not present

## 2014-08-10 DIAGNOSIS — M2560 Stiffness of unspecified joint, not elsewhere classified: Secondary | ICD-10-CM | POA: Insufficient documentation

## 2014-08-10 DIAGNOSIS — M25519 Pain in unspecified shoulder: Secondary | ICD-10-CM | POA: Insufficient documentation

## 2014-08-11 ENCOUNTER — Ambulatory Visit: Payer: Medicare Other | Admitting: Physical Therapy

## 2014-08-11 DIAGNOSIS — Z5189 Encounter for other specified aftercare: Secondary | ICD-10-CM | POA: Diagnosis not present

## 2014-08-30 DIAGNOSIS — K648 Other hemorrhoids: Secondary | ICD-10-CM | POA: Diagnosis not present

## 2014-08-31 ENCOUNTER — Ambulatory Visit: Payer: Medicare Other | Attending: Family Medicine | Admitting: Physical Therapy

## 2014-08-31 DIAGNOSIS — M542 Cervicalgia: Secondary | ICD-10-CM | POA: Diagnosis not present

## 2014-08-31 DIAGNOSIS — M2560 Stiffness of unspecified joint, not elsewhere classified: Secondary | ICD-10-CM | POA: Diagnosis not present

## 2014-08-31 DIAGNOSIS — M25519 Pain in unspecified shoulder: Secondary | ICD-10-CM | POA: Diagnosis not present

## 2014-08-31 DIAGNOSIS — Z5189 Encounter for other specified aftercare: Secondary | ICD-10-CM | POA: Diagnosis not present

## 2014-08-31 DIAGNOSIS — R293 Abnormal posture: Secondary | ICD-10-CM | POA: Diagnosis not present

## 2014-09-02 ENCOUNTER — Ambulatory Visit: Payer: Medicare Other | Admitting: Physical Therapy

## 2014-09-02 DIAGNOSIS — Z5189 Encounter for other specified aftercare: Secondary | ICD-10-CM | POA: Diagnosis not present

## 2014-09-05 ENCOUNTER — Observation Stay (HOSPITAL_COMMUNITY): Payer: Medicare Other | Admitting: Anesthesiology

## 2014-09-05 ENCOUNTER — Encounter (HOSPITAL_COMMUNITY): Payer: Self-pay | Admitting: Emergency Medicine

## 2014-09-05 ENCOUNTER — Observation Stay (HOSPITAL_COMMUNITY)
Admission: EM | Admit: 2014-09-05 | Discharge: 2014-09-06 | Disposition: A | Discharge status: Home / Self Care | Payer: Medicare Other | Attending: General Surgery | Admitting: General Surgery

## 2014-09-05 ENCOUNTER — Emergency Department (HOSPITAL_COMMUNITY): Payer: Medicare Other

## 2014-09-05 ENCOUNTER — Encounter (HOSPITAL_COMMUNITY)
Admission: EM | Disposition: A | Discharge status: Home / Self Care | Payer: Self-pay | Source: Home / Self Care | Attending: Emergency Medicine

## 2014-09-05 DIAGNOSIS — Z87891 Personal history of nicotine dependence: Secondary | ICD-10-CM | POA: Diagnosis not present

## 2014-09-05 DIAGNOSIS — Z7982 Long term (current) use of aspirin: Secondary | ICD-10-CM | POA: Insufficient documentation

## 2014-09-05 DIAGNOSIS — K353 Acute appendicitis with localized peritonitis, without perforation or gangrene: Secondary | ICD-10-CM

## 2014-09-05 DIAGNOSIS — E278 Other specified disorders of adrenal gland: Secondary | ICD-10-CM | POA: Diagnosis not present

## 2014-09-05 DIAGNOSIS — K3580 Unspecified acute appendicitis: Principal | ICD-10-CM | POA: Insufficient documentation

## 2014-09-05 DIAGNOSIS — E785 Hyperlipidemia, unspecified: Secondary | ICD-10-CM | POA: Insufficient documentation

## 2014-09-05 DIAGNOSIS — Z79899 Other long term (current) drug therapy: Secondary | ICD-10-CM | POA: Insufficient documentation

## 2014-09-05 DIAGNOSIS — K37 Unspecified appendicitis: Secondary | ICD-10-CM

## 2014-09-05 DIAGNOSIS — R1031 Right lower quadrant pain: Secondary | ICD-10-CM | POA: Diagnosis not present

## 2014-09-05 HISTORY — DX: Unspecified appendicitis: K37

## 2014-09-05 HISTORY — DX: Hyperlipidemia, unspecified: E78.5

## 2014-09-05 HISTORY — PX: LAPAROSCOPIC APPENDECTOMY: SHX408

## 2014-09-05 LAB — COMPREHENSIVE METABOLIC PANEL
ALT: 24 U/L (ref 0–53)
AST: 20 U/L (ref 0–37)
Albumin: 3.6 g/dL (ref 3.5–5.2)
Alkaline Phosphatase: 81 U/L (ref 39–117)
Anion gap: 9 (ref 5–15)
BUN: 11 mg/dL (ref 6–23)
CO2: 29 mEq/L (ref 19–32)
Calcium: 8.9 mg/dL (ref 8.4–10.5)
Chloride: 104 mEq/L (ref 96–112)
Creatinine, Ser: 0.85 mg/dL (ref 0.50–1.35)
GFR calc Af Amer: >90 mL/min (ref >90)
GFR calc non Af Amer: 84 mL/min — ABNORMAL LOW (ref >90)
Glucose, Bld: 114 mg/dL — ABNORMAL HIGH (ref 70–99)
Potassium: 4.2 mEq/L (ref 3.7–5.3)
Sodium: 142 mEq/L (ref 137–147)
Total Bilirubin: 0.6 mg/dL (ref 0.3–1.2)
Total Protein: 6.8 g/dL (ref 6.0–8.3)

## 2014-09-05 LAB — CBC WITH DIFFERENTIAL/PLATELET
Basophils Absolute: 0 10*3/uL (ref 0.0–0.1)
Basophils Relative: 0 % (ref 0–1)
Eosinophils Absolute: 0.2 10*3/uL (ref 0.0–0.7)
Eosinophils Relative: 3 % (ref 0–5)
HCT: 43.6 % (ref 39.0–52.0)
Hemoglobin: 14.7 g/dL (ref 13.0–17.0)
Lymphocytes Relative: 17 % (ref 12–46)
Lymphs Abs: 1.3 10*3/uL (ref 0.7–4.0)
MCH: 29.8 pg (ref 26.0–34.0)
MCHC: 33.7 g/dL (ref 30.0–36.0)
MCV: 88.3 fL (ref 78.0–100.0)
Monocytes Absolute: 0.4 10*3/uL (ref 0.1–1.0)
Monocytes Relative: 5 % (ref 3–12)
Neutro Abs: 5.8 10*3/uL (ref 1.7–7.7)
Neutrophils Relative %: 75 % (ref 43–77)
Platelets: 203 10*3/uL (ref 150–400)
RBC: 4.94 MIL/uL (ref 4.22–5.81)
RDW: 13.9 % (ref 11.5–15.5)
WBC: 7.7 10*3/uL (ref 4.0–10.5)

## 2014-09-05 LAB — URINALYSIS, ROUTINE W REFLEX MICROSCOPIC
Bilirubin Urine: NEGATIVE
Glucose, UA: NEGATIVE mg/dL
Hgb urine dipstick: NEGATIVE
Ketones, ur: NEGATIVE mg/dL
Leukocytes, UA: NEGATIVE
Nitrite: NEGATIVE
Protein, ur: NEGATIVE mg/dL
Specific Gravity, Urine: 1.007 (ref 1.005–1.030)
Urobilinogen, UA: 0.2 mg/dL (ref 0.0–1.0)
pH: 7.5 (ref 5.0–8.0)

## 2014-09-05 LAB — LIPASE, BLOOD: Lipase: 22 U/L (ref 11–59)

## 2014-09-05 SURGERY — APPENDECTOMY, LAPAROSCOPIC
Anesthesia: General

## 2014-09-05 MED ORDER — IBUPROFEN 600 MG PO TABS: 600.0000 mg | ORAL_TABLET | Freq: Four times a day (QID) | ORAL | Status: DC | PRN

## 2014-09-05 MED ORDER — MIDAZOLAM HCL 2 MG/2ML IJ SOLN
INTRAMUSCULAR | Status: DC | PRN
  Administered 2014-09-05: 2 mg via INTRAVENOUS

## 2014-09-05 MED ORDER — PROPOFOL 10 MG/ML IV BOLUS
INTRAVENOUS | Status: DC | PRN
  Administered 2014-09-05: 20 mg via INTRAVENOUS
  Administered 2014-09-05: 180 mg via INTRAVENOUS

## 2014-09-05 MED ORDER — SUCCINYLCHOLINE CHLORIDE 20 MG/ML IJ SOLN
INTRAMUSCULAR | Status: AC
  Filled 2014-09-05: qty 1

## 2014-09-05 MED ORDER — MIDAZOLAM HCL 2 MG/2ML IJ SOLN
INTRAMUSCULAR | Status: AC
  Filled 2014-09-05: qty 2

## 2014-09-05 MED ORDER — LIDOCAINE HCL (CARDIAC) 20 MG/ML IV SOLN
INTRAVENOUS | Status: AC
  Filled 2014-09-05: qty 5

## 2014-09-05 MED ORDER — GABAPENTIN 400 MG PO CAPS
400.0000 mg | ORAL_CAPSULE | Freq: Three times a day (TID) | ORAL | Status: DC
  Administered 2014-09-05 – 2014-09-06 (×2): 400 mg via ORAL
  Filled 2014-09-05 (×4): qty 1

## 2014-09-05 MED ORDER — PROPOFOL 10 MG/ML IV BOLUS
INTRAVENOUS | Status: AC
  Filled 2014-09-05: qty 20

## 2014-09-05 MED ORDER — GLYCOPYRROLATE 0.2 MG/ML IJ SOLN
INTRAMUSCULAR | Status: AC
  Filled 2014-09-05: qty 2

## 2014-09-05 MED ORDER — HYDROCODONE-ACETAMINOPHEN 5-325 MG PO TABS
1.0000 | ORAL_TABLET | ORAL | Status: DC | PRN
  Administered 2014-09-06: 2 via ORAL
  Filled 2014-09-05: qty 2

## 2014-09-05 MED ORDER — MORPHINE SULFATE 2 MG/ML IJ SOLN: 1.0000 mg | INTRAMUSCULAR | Status: DC | PRN

## 2014-09-05 MED ORDER — NEOSTIGMINE METHYLSULFATE 10 MG/10ML IV SOLN
INTRAVENOUS | Status: AC
  Filled 2014-09-05: qty 1

## 2014-09-05 MED ORDER — SCOPOLAMINE 1 MG/3DAYS TD PT72
MEDICATED_PATCH | TRANSDERMAL | Status: DC | PRN
  Administered 2014-09-05: 1 via TRANSDERMAL

## 2014-09-05 MED ORDER — 0.9 % SODIUM CHLORIDE (POUR BTL) OPTIME
TOPICAL | Status: DC | PRN
  Administered 2014-09-05: 1000 mL

## 2014-09-05 MED ORDER — BUPIVACAINE-EPINEPHRINE (PF) 0.5% -1:200000 IJ SOLN
INTRAMUSCULAR | Status: AC
  Filled 2014-09-05: qty 30

## 2014-09-05 MED ORDER — ENOXAPARIN SODIUM 40 MG/0.4ML ~~LOC~~ SOLN
40.0000 mg | SUBCUTANEOUS | Status: DC
Start: 2014-09-06 — End: 2014-09-06
  Administered 2014-09-06: 40 mg via SUBCUTANEOUS
  Filled 2014-09-05 (×2): qty 0.4

## 2014-09-05 MED ORDER — NEOSTIGMINE METHYLSULFATE 10 MG/10ML IV SOLN
INTRAVENOUS | Status: DC | PRN
  Administered 2014-09-05: 2 mg via INTRAVENOUS

## 2014-09-05 MED ORDER — FENTANYL CITRATE 0.05 MG/ML IJ SOLN
INTRAMUSCULAR | Status: DC | PRN
  Administered 2014-09-05: 150 ug via INTRAVENOUS
  Administered 2014-09-05: 100 ug via INTRAVENOUS

## 2014-09-05 MED ORDER — FENTANYL CITRATE 0.05 MG/ML IJ SOLN
INTRAMUSCULAR | Status: AC
  Filled 2014-09-05: qty 5

## 2014-09-05 MED ORDER — ONDANSETRON HCL 4 MG PO TABS: 4.0000 mg | ORAL_TABLET | Freq: Four times a day (QID) | ORAL | Status: DC | PRN

## 2014-09-05 MED ORDER — ONDANSETRON HCL 4 MG/2ML IJ SOLN: 4.0000 mg | Freq: Four times a day (QID) | INTRAMUSCULAR | Status: DC | PRN

## 2014-09-05 MED ORDER — BUPIVACAINE-EPINEPHRINE 0.5% -1:200000 IJ SOLN
INTRAMUSCULAR | Status: DC | PRN
  Administered 2014-09-05: 10 mL

## 2014-09-05 MED ORDER — SUCCINYLCHOLINE CHLORIDE 20 MG/ML IJ SOLN
INTRAMUSCULAR | Status: DC | PRN
  Administered 2014-09-05: 100 mg via INTRAVENOUS

## 2014-09-05 MED ORDER — HYDROCODONE-ACETAMINOPHEN 5-325 MG PO TABS: 1.0000 | ORAL_TABLET | ORAL | Status: DC | PRN

## 2014-09-05 MED ORDER — DIPHENHYDRAMINE HCL 12.5 MG/5ML PO ELIX: 12.5000 mg | ORAL_SOLUTION | Freq: Four times a day (QID) | ORAL | Status: DC | PRN

## 2014-09-05 MED ORDER — GLYCOPYRROLATE 0.2 MG/ML IJ SOLN
INTRAMUSCULAR | Status: DC | PRN
  Administered 2014-09-05: 0.4 mg via INTRAVENOUS

## 2014-09-05 MED ORDER — FENTANYL CITRATE 0.05 MG/ML IJ SOLN: 25.0000 ug | INTRAMUSCULAR | Status: DC | PRN

## 2014-09-05 MED ORDER — DIPHENHYDRAMINE HCL 50 MG/ML IJ SOLN: 12.5000 mg | Freq: Four times a day (QID) | INTRAMUSCULAR | Status: DC | PRN

## 2014-09-05 MED ORDER — DEXAMETHASONE SODIUM PHOSPHATE 4 MG/ML IJ SOLN
INTRAMUSCULAR | Status: AC
  Filled 2014-09-05: qty 1

## 2014-09-05 MED ORDER — DEXTROSE 5 % IV SOLN
2.0000 g | INTRAVENOUS | Status: DC
  Filled 2014-09-05: qty 2

## 2014-09-05 MED ORDER — LIDOCAINE HCL (CARDIAC) 20 MG/ML IV SOLN
INTRAVENOUS | Status: DC | PRN
  Administered 2014-09-05: 50 mg via INTRAVENOUS

## 2014-09-05 MED ORDER — FINASTERIDE 5 MG PO TABS
5.0000 mg | ORAL_TABLET | Freq: Every day | ORAL | Status: DC
  Administered 2014-09-06: 5 mg via ORAL
  Filled 2014-09-05: qty 1

## 2014-09-05 MED ORDER — LACTATED RINGERS IV SOLN
INTRAVENOUS | Status: DC | PRN
  Administered 2014-09-05 (×2): via INTRAVENOUS

## 2014-09-05 MED ORDER — POTASSIUM CHLORIDE IN NACL 20-0.9 MEQ/L-% IV SOLN
INTRAVENOUS | Status: DC
  Filled 2014-09-05 (×2): qty 1000

## 2014-09-05 MED ORDER — IOHEXOL 300 MG/ML  SOLN
25.0000 mL | Freq: Once | INTRAMUSCULAR | Status: AC | PRN
  Administered 2014-09-05: 25 mL via ORAL

## 2014-09-05 MED ORDER — ONDANSETRON HCL 4 MG/2ML IJ SOLN
INTRAMUSCULAR | Status: DC | PRN
  Administered 2014-09-05: 4 mg via INTRAVENOUS

## 2014-09-05 MED ORDER — VECURONIUM BROMIDE 10 MG IV SOLR
INTRAVENOUS | Status: AC
  Filled 2014-09-05: qty 10

## 2014-09-05 MED ORDER — DEXTROSE 5 % IV SOLN
1.0000 g | INTRAVENOUS | Status: DC | PRN
  Administered 2014-09-05 (×2): 1 g via INTRAVENOUS

## 2014-09-05 MED ORDER — DEXAMETHASONE SODIUM PHOSPHATE 4 MG/ML IJ SOLN
INTRAMUSCULAR | Status: DC | PRN
  Administered 2014-09-05: 4 mg via INTRAVENOUS

## 2014-09-05 MED ORDER — SODIUM CHLORIDE 0.9 % IJ SOLN
INTRAMUSCULAR | Status: AC
  Filled 2014-09-05: qty 10

## 2014-09-05 MED ORDER — IOHEXOL 300 MG/ML  SOLN
100.0000 mL | Freq: Once | INTRAMUSCULAR | Status: AC | PRN
  Administered 2014-09-05: 100 mL via INTRAVENOUS

## 2014-09-05 MED ORDER — SCOPOLAMINE 1 MG/3DAYS TD PT72
MEDICATED_PATCH | TRANSDERMAL | Status: AC
  Filled 2014-09-05: qty 1

## 2014-09-05 MED ORDER — VECURONIUM BROMIDE 10 MG IV SOLR
INTRAVENOUS | Status: DC | PRN
  Administered 2014-09-05: 3 mg via INTRAVENOUS

## 2014-09-05 MED ORDER — PHENYLEPHRINE HCL 10 MG/ML IJ SOLN
INTRAMUSCULAR | Status: DC | PRN
  Administered 2014-09-05: 80 ug via INTRAVENOUS

## 2014-09-05 SURGICAL SUPPLY — 44 items
APL SKNCLS STERI-STRIP NONHPOA (GAUZE/BANDAGES/DRESSINGS) ×1
APPLIER CLIP 5 13 M/L LIGAMAX5 (MISCELLANEOUS)
APPLIER CLIP ROT 10 11.4 M/L (STAPLE)
APR CLP MED LRG 11.4X10 (STAPLE)
APR CLP MED LRG 5 ANG JAW (MISCELLANEOUS)
BAG SPEC RTRVL LRG 6X4 10 (ENDOMECHANICALS) ×1
BANDAGE ADH SHEER 1  50/CT (GAUZE/BANDAGES/DRESSINGS) ×6 IMPLANT
BENZOIN TINCTURE PRP APPL 2/3 (GAUZE/BANDAGES/DRESSINGS) ×2 IMPLANT
CANISTER SUCTION 2500CC (MISCELLANEOUS) ×2 IMPLANT
CHLORAPREP W/TINT 26ML (MISCELLANEOUS) ×2 IMPLANT
CLIP APPLIE 5 13 M/L LIGAMAX5 (MISCELLANEOUS) IMPLANT
CLIP APPLIE ROT 10 11.4 M/L (STAPLE) IMPLANT
COVER SURGICAL LIGHT HANDLE (MISCELLANEOUS) ×2 IMPLANT
CUTTER LINEAR ENDO 35 ETS (STAPLE) ×1 IMPLANT
CUTTER LINEAR ENDO 35 ETS TH (STAPLE) IMPLANT
DRAPE LAPAROSCOPIC ABDOMINAL (DRAPES) ×2 IMPLANT
DRAPE UTILITY 15X26 W/TAPE STR (DRAPE) ×4 IMPLANT
ELECT REM PT RETURN 9FT ADLT (ELECTROSURGICAL) ×2
ELECTRODE REM PT RTRN 9FT ADLT (ELECTROSURGICAL) ×1 IMPLANT
ENDOLOOP SUT PDS II  0 18 (SUTURE)
ENDOLOOP SUT PDS II 0 18 (SUTURE) IMPLANT
GLOVE SURG SIGNA 7.5 PF LTX (GLOVE) ×2 IMPLANT
GOWN STRL REUS W/ TWL LRG LVL3 (GOWN DISPOSABLE) ×2 IMPLANT
GOWN STRL REUS W/ TWL XL LVL3 (GOWN DISPOSABLE) ×1 IMPLANT
GOWN STRL REUS W/TWL LRG LVL3 (GOWN DISPOSABLE) ×4
GOWN STRL REUS W/TWL XL LVL3 (GOWN DISPOSABLE) ×2
KIT BASIN OR (CUSTOM PROCEDURE TRAY) ×2 IMPLANT
KIT ROOM TURNOVER OR (KITS) ×2 IMPLANT
NS IRRIG 1000ML POUR BTL (IV SOLUTION) ×2 IMPLANT
PAD ARMBOARD 7.5X6 YLW CONV (MISCELLANEOUS) ×4 IMPLANT
POUCH SPECIMEN RETRIEVAL 10MM (ENDOMECHANICALS) ×2 IMPLANT
RELOAD /EVU35 (ENDOMECHANICALS) IMPLANT
RELOAD CUTTER ETS 35MM STAND (ENDOMECHANICALS) IMPLANT
SCALPEL HARMONIC ACE (MISCELLANEOUS) ×2 IMPLANT
SET IRRIG TUBING LAPAROSCOPIC (IRRIGATION / IRRIGATOR) ×2 IMPLANT
SLEEVE ENDOPATH XCEL 5M (ENDOMECHANICALS) ×2 IMPLANT
SPECIMEN JAR SMALL (MISCELLANEOUS) ×2 IMPLANT
SUT MON AB 4-0 PC3 18 (SUTURE) ×2 IMPLANT
TOWEL OR 17X24 6PK STRL BLUE (TOWEL DISPOSABLE) ×2 IMPLANT
TOWEL OR 17X26 10 PK STRL BLUE (TOWEL DISPOSABLE) ×2 IMPLANT
TRAY LAPAROSCOPIC (CUSTOM PROCEDURE TRAY) ×2 IMPLANT
TROCAR XCEL BLUNT TIP 100MML (ENDOMECHANICALS) ×2 IMPLANT
TROCAR XCEL NON-BLD 5MMX100MML (ENDOMECHANICALS) ×2 IMPLANT
TUBING INSUFFLATION (TUBING) ×2 IMPLANT

## 2014-09-05 NOTE — Transfer of Care (Signed)
Immediate Anesthesia Transfer of Care Note  Patient: [name]  Procedure(s) Performed: Procedure(s): APPENDECTOMY LAPAROSCOPIC (N/A)  Patient Location: PACU  Anesthesia Type:General  Level of Consciousness: awake  Airway & Oxygen Therapy: Patient Spontanous Breathing and Patient connected to nasal cannula oxygen  Post-op Assessment: Report given to PACU RN and Post -op Vital signs reviewed and stable  Post vital signs: Reviewed and stable  Complications: No apparent anesthesia complications

## 2014-09-05 NOTE — Anesthesia Preprocedure Evaluation (Addendum)
Anesthesia Evaluation  Patient identified by MRN, date of birth, ID band Patient awake    Reviewed: Allergy & Precautions, H&P , NPO status , Patient's Chart, lab work & pertinent test results  Airway Mallampati: II  TM Distance: >3 FB     Dental  (+) Dental Advisory Given, Teeth Intact, Missing   Pulmonary former smoker,          Cardiovascular + dysrhythmias     Neuro/Psych    GI/Hepatic Neg liver ROS, GI history noted. CE   Endo/Other  negative endocrine ROS  Renal/GU negative Renal ROS     Musculoskeletal   Abdominal   Peds  Hematology   Anesthesia Other Findings S/p ablation  Reproductive/Obstetrics                            Anesthesia Physical Anesthesia Plan  ASA: III and emergent  Anesthesia Plan:    Post-op Pain Management:    Induction: Intravenous, Rapid sequence and Cricoid pressure planned  Airway Management Planned: Oral ETT  Additional Equipment:   Intra-op Plan:   Post-operative Plan: Extubation in OR  Informed Consent: I have reviewed the patients History and Physical, chart, labs and discussed the procedure including the risks, benefits and alternatives for the proposed anesthesia with the patient or authorized representative who has indicated his/her understanding and acceptance.   Dental advisory given  Plan Discussed with: CRNA and Anesthesiologist  Anesthesia Plan Comments:         Anesthesia Quick Evaluation

## 2014-09-05 NOTE — Progress Notes (Signed)
Patient ID: [name], male   DOB: 02/28/1941, 73 y.o.   MRN: [id number]  Agree with PA's note  Acute appendicitis  Plan: Will proceed to the OR for a laparoscopic appendectomy.  I discussed the risks which include but are not limited to bleeding, infection, injury to surrounding structures, need for further surgery, need to convert to an open procedure, cardiopulmonary issues, DVT, etc.  He agrees to proceed.

## 2014-09-05 NOTE — ED Notes (Signed)
Pt c/o right lower abdomina pain onset 1 hour PTA. Pt reports in March he had left lower abdominal pain and was seen in hospital in [place]. At that time pt was told he had an abscess on his colon.

## 2014-09-05 NOTE — Op Note (Signed)
Appendectomy, Lap, Procedure Note  Indications: The patient presented with a history of right-sided abdominal pain. A CT revealed findings consistent with acute appendicitis.  Pre-operative Diagnosis: Acute appendicitis without mention of peritonitis  Post-operative Diagnosis: Same  Surgeon: [name] A   Assistants: 0  Anesthesia: General endotracheal anesthesia  ASA Class: 2  Procedure Details  The patient was seen again in the Holding Room. The risks, benefits, complications, treatment options, and expected outcomes were discussed with the patient and/or family. The possibilities of reaction to medication, perforation of viscus, bleeding, recurrent infection, finding a normal appendix, the need for additional procedures, failure to diagnose a condition, and creating a complication requiring transfusion or operation were discussed. There was concurrence with the proposed plan and informed consent was obtained. The site of surgery was properly noted. The patient was taken to Operating Room, identified as [name] and the procedure verified as Appendectomy. A Time Out was held and the above information confirmed.  The patient was placed in the supine position and general anesthesia was induced, along with placement of orogastric tube, Venodyne boots, and a Foley catheter. The abdomen was prepped and draped in a sterile fashion. A one centimeter infraumbilical incision was made.  The umbilical stalk was elevated, and the midline fascia was incised with a #11 blade.  A Kelly clamp was used to confirm entrance into the peritoneal cavity.  A pursestring suture was passed around the incision with a 0 Vicryl.  The Hasson was introduced into the abdomen and the tails of the suture were used to hold the Hasson in place.   The pneumoperitoneum was then established to steady pressure of 15 mmHg.  Additional 5 mm cannulas then placed in the left lower quadrant of the abdomen and the suprapubic  region under direct visualization. A careful evaluation of the entire abdomen was carried out. The patient was placed in Trendelenburg and left lateral decubitus position. The small intestines were retracted in the cephalad and left lateral direction away from the pelvis and right lower quadrant. The patient was found to have an enlarged and inflamed appendix that was extending into the pelvis. There was no evidence of perforation.  The appendix was carefully dissected. The appendix was was skeletonized with the harmonic scalpel.   The appendix was divided at its base using an endo-GIA stapler. Minimal appendiceal stump was left in place. There was no evidence of bleeding, leakage, or complication after division of the appendix. Irrigation was also performed and irrigate suctioned from the abdomen as well.  The umbilical port site was closed with the purse string suture. There was no residual palpable fascial defect.  The trocar site skin wounds were closed with 4-0 Monocryl.  Instrument, sponge, and needle counts were correct at the conclusion of the case.   Findings: The appendix was found to be inflamed. There were not signs of necrosis.  There was not perforation. There was not abscess formation.  Estimated Blood Loss:  Minimal         Drains:none         Complications:  None; patient tolerated the procedure well.         Disposition: PACU - hemodynamically stable.         Condition: stable

## 2014-09-05 NOTE — ED Provider Notes (Signed)
CSN: [id number]     Arrival date & time 09/05/14  1143 History   First MD Initiated Contact with Patient 09/05/14 1219     Chief Complaint  Patient presents with  . Abdominal Pain     (Consider location/radiation/quality/duration/timing/severity/associated sxs/prior Treatment) Patient is a 73 y.o. male presenting with abdominal pain.  Abdominal Pain Pain location:  RLQ Pain quality: cramping   Pain radiates to:  Does not radiate Pain severity:  Severe Onset quality:  Sudden Duration:  1 hour Timing:  Constant Progression:  Unchanged Chronicity:  New Context comment:  Spontaneous Relieved by:  Nothing Worsened by:  Palpation Ineffective treatments:  None tried Associated symptoms: anorexia   Associated symptoms: no chest pain, no chills, no constipation, no cough, no diarrhea, no dysuria, no fever, no hematuria, no nausea, no shortness of breath, no sore throat and no vomiting     Past Medical History  Diagnosis Date  . Osteoarthritis   . Tendinitis     achiles heel spur  . Testicular atrophy   . ED (erectile dysfunction)   . History of colon polyps   . Diverticulosis     extensive  . Palpitation     with svt rate 150  . Hyperlipidemia    History reviewed. No pertinent past surgical history. Family History  Problem Relation Age of Onset  . Family history unknown: Yes   History  Substance Use Topics  . Smoking status: Former Smoker    Quit date: 02/13/2002  . Smokeless tobacco: Former Systems developer  . Alcohol Use: Yes    Review of Systems  Constitutional: Negative for fever and chills.  HENT: Negative for congestion, rhinorrhea and sore throat.   Eyes: Negative for photophobia and visual disturbance.  Respiratory: Negative for cough and shortness of breath.   Cardiovascular: Negative for chest pain and leg swelling.  Gastrointestinal: Positive for abdominal pain and anorexia. Negative for nausea, vomiting, diarrhea and constipation.  Endocrine: Negative for  polydipsia and polyuria.  Genitourinary: Negative for dysuria and hematuria.  Musculoskeletal: Negative for back pain and arthralgias.  Skin: Negative for color change and rash.  Neurological: Negative for dizziness, syncope, light-headedness and headaches.  Hematological: Negative for adenopathy. Does not bruise/bleed easily.  All other systems reviewed and are negative.     Allergies  Review of patient's allergies indicates no known allergies.  Home Medications   Prior to Admission medications   Medication Sig Start Date End Date Taking? Authorizing Provider  Ascorbic Acid (VITAMIN C) 1000 MG tablet Take 1,000 mg by mouth daily.   Yes Historical Provider, MD  aspirin 81 MG EC tablet Take 81 mg by mouth daily.     Yes Historical Provider, MD  atorvastatin (LIPITOR) 10 MG tablet Take 10 mg by mouth daily.  09/03/14  Yes Historical Provider, MD  b complex vitamins tablet Take 1 tablet by mouth daily.   Yes Historical Provider, MD  Calcium Carbonate-Vitamin D (CALCIUM 600+D) 600-400 MG-UNIT per tablet Take 1 tablet by mouth daily.     Yes Historical Provider, MD  finasteride (PROSCAR) 5 MG tablet Take 5 mg by mouth daily.  08/20/14  Yes Historical Provider, MD  gabapentin (NEURONTIN) 400 MG capsule Take 400 mg by mouth 3 (three) times daily.   Yes Historical Provider, MD  GLUCOSAMINE-CHONDROITIN-VIT C PO Take 1 tablet by mouth daily.     Yes Historical Provider, MD  Multiple Vitamin (MULTIVITAMIN) tablet Take 1 tablet by mouth daily.     Yes Historical Provider, MD  psyllium (METAMUCIL SMOOTH TEXTURE) 28 % packet Take 1 packet by mouth 2 (two) times daily.   Yes Historical Provider, MD  RAPAFLO 8 MG CAPS capsule Take 8 mg by mouth daily with breakfast.  09/05/14  Yes Historical Provider, MD   BP 142/86 mmHg  Pulse 79  Temp(Src) 97.6 F (36.4 C) (Oral)  Resp 16  Ht 6' (1.829 m)  Wt 200 lb (90.719 kg)  BMI 27.12 kg/m2  SpO2 100% Physical Exam  Constitutional: He is oriented to  person, place, and time. He appears well-developed and well-nourished.  HENT:  Head: Normocephalic and atraumatic.  Eyes: Conjunctivae and EOM are normal.  Neck: Normal range of motion. Neck supple.  Cardiovascular: Normal rate, regular rhythm and normal heart sounds.   Pulmonary/Chest: Effort normal and breath sounds normal. No respiratory distress.  Abdominal: He exhibits no distension. There is tenderness in the right lower quadrant. There is no rebound and no guarding.  Musculoskeletal: Normal range of motion.  Neurological: He is alert and oriented to person, place, and time.  Skin: Skin is warm and dry.  Vitals reviewed.   ED Course  Procedures (including critical care time) Labs Review Labs Reviewed  COMPREHENSIVE METABOLIC PANEL - Abnormal; Notable for the following:    Glucose, Bld 114 (*)    GFR calc non Af Amer 84 (*)    All other components within normal limits  CBC WITH DIFFERENTIAL  LIPASE, BLOOD  URINALYSIS, ROUTINE W REFLEX MICROSCOPIC    Imaging Review Ct Abdomen Pelvis W Contrast  09/05/2014   CLINICAL DATA:  Right lower quadrant abdominal pain  EXAM: CT ABDOMEN AND PELVIS WITH CONTRAST  TECHNIQUE: Multidetector CT imaging of the abdomen and pelvis was performed using the standard protocol following bolus administration of intravenous contrast.  CONTRAST:  172mL OMNIPAQUE IOHEXOL 300 MG/ML SOLN, 63mL OMNIPAQUE IOHEXOL 300 MG/ML SOLN  COMPARISON:  02/02/2014  FINDINGS: Dependent bibasilar atelectasis and right lower lobe scarring noted.  Numerous low-density hepatic lesions are reidentified, most of which are compatible with cysts or potentially biliary hamartomas. A wedge-shaped hypodense lesion measuring 1.6 cm in the posterior segment right hepatic lobe image 31 is reidentified which does not appear typical for a cyst but is not further characterized and appear stable. Gallbladder, right adrenal gland, spleen, pancreas appear unremarkable. 1.3 cm left adrenal nodule  is larger, image 30. Numerous low-density bilateral renal cortical hypodense lesions are reidentified with large 7.6 cm left upper pole parapelvic cyst noted. The ureter is seen inferior to this parapelvic cyst image 51. There is mild prominence of the left intrarenal collecting system which could reflect mass effect upon the ureter from the left upper pole parapelvic cyst. Nonobstructing 1 mm right upper renal pole calculus image 35.  No lymphadenopathy.  No free air.  No ascites.  Colonic diverticuli noted without evidence for diverticulitis. The appendix is dilated with mural hyper enhancement and surrounding stranding, maximal diameter 1.3 cm image 66. No surrounding fluid collection.  Prostatomegaly measuring 5.7 cm image 86. Bladder is normal. Moderate atheromatous aortic calcification without aneurysm. Stranding within the anterior abdominal wall subcutaneous fat may indicate injection sites.  Degenerative change noted in the spine.  IMPRESSION: Findings compatible with acute appendicitis without surrounding fluid collection or free air.  Left adrenal nodule is increased in size. Further outpatient nonemergent characterization with abdominal MRI with contrast is recommended. This would also be helpful to definitively characterize the right hepatic lobe mass which is not definitively a cyst based on the current  or prior exam.  These results were called by telephone at the time of interpretation on 09/05/2014 at 2:08 pm to Dr. Debby Freiberg , who verbally acknowledged these results.   Electronically Signed   By: Conchita Paris M.D.   On: 09/05/2014 14:10     EKG Interpretation None      MDM   Final diagnoses:  RLQ abdominal pain    73 y.o. male with pertinent PMH of prior diverticulitis with abscess (responded to abx) presents with right lower quadrant pain as described above.  Physical exam concerning for appendicitis versus other emergent pathology. CT scan of abdomen ordered  This was  evident for appendicitis.  Admitted to surgery.    1. RLQ abdominal pain    2.  Appendicitis, acute    Debby Freiberg, MD 09/05/14 1529

## 2014-09-05 NOTE — ED Notes (Signed)
Patient returned from CT

## 2014-09-05 NOTE — ED Notes (Signed)
CT notified patient finished with contrast

## 2014-09-05 NOTE — Anesthesia Postprocedure Evaluation (Signed)
  Anesthesia Post-op Note  Patient: [name]  Procedure(s) Performed: Procedure(s): APPENDECTOMY LAPAROSCOPIC (N/A)  Patient Location: PACU  Anesthesia Type:General  Level of Consciousness: awake  Airway and Oxygen Therapy: Patient Spontanous Breathing  Post-op Pain: mild  Post-op Assessment: Post-op Vital signs reviewed  Post-op Vital Signs: Reviewed  Last Vitals:  Filed Vitals:   09/05/14 1655  BP: 146/80  Pulse: 78  Temp:   Resp: 18    Complications: No apparent anesthesia complications

## 2014-09-05 NOTE — Anesthesia Procedure Notes (Signed)
Procedure Name: Intubation Date/Time: 09/05/2014 3:36 PM Performed by: Marinda Elk A Pre-anesthesia Checklist: Patient identified, Timeout performed, Emergency Drugs available, Suction available and Patient being monitored Patient Re-evaluated:Patient Re-evaluated prior to inductionOxygen Delivery Method: Circle system utilized Preoxygenation: Pre-oxygenation with 100% oxygen Intubation Type: IV induction, Rapid sequence and Cricoid Pressure applied Laryngoscope Size: Mac and 3 Grade View: Grade I Tube type: Oral Tube size: 7.5 mm Number of attempts: 1 Airway Equipment and Method: Stylet Placement Confirmation: ETT inserted through vocal cords under direct vision,  breath sounds checked- equal and bilateral and positive ETCO2 Secured at: 22 cm Tube secured with: Tape Dental Injury: Teeth and Oropharynx as per pre-operative assessment

## 2014-09-05 NOTE — H&P (Signed)
[facility] Admission Note  [name] Oct 14, 1941  [id number].    Requesting MD: Dr. Colin Rhein Chief Complaint/Reason for Consult:   HPI:  73 y/o white male with h/o b/l inguinal hernia repairs, HLD, palpitations treated with an ablation, hyperh/o diverticulitis treated conservatively presents to [facility] with acute onset of severe sharp RLQ abdominal pain.  He denies N/V/D, no CP/SOB, fever/chills.  Last time he felt this pain was during a diverticulitis episode.  No precipitating or alleviating factors.  Pain was 10/10, but much less now.  No radiation pain.     ROS: All systems reviewed and otherwise negative except for as above  Family History  Problem Relation Age of Onset  . Family history unknown: Yes    Past Medical History  Diagnosis Date  . Osteoarthritis   . Tendinitis     achiles heel spur  . Testicular atrophy   . ED (erectile dysfunction)   . History of colon polyps   . Diverticulosis     extensive  . Palpitation     with svt rate 150    History reviewed. No pertinent past surgical history.  Social History:  reports that he quit smoking about 12 years ago. He has quit using smokeless tobacco. He reports that he drinks alcohol. He reports that he does not use illicit drugs.  Allergies: No Known Allergies   (Not in a hospital admission)  Blood pressure 122/71, pulse 78, temperature 97.6 F (36.4 C), temperature source Oral, resp. rate 20, height 6' (1.829 m), weight 200 lb (90.719 kg), SpO2 99 %. Physical Exam: General: pleasant, WD/WN white male who is laying in bed in NAD HEENT: head is normocephalic, atraumatic.  Sclera are noninjected.  PERRL.  Ears and nose without any masses or lesions.  Mouth is pink and moist Heart: regular, rate, and rhythm.  No obvious murmurs, gallops, or rubs noted.  Palpable pedal pulses bilaterally Lungs: CTAB, no wheezes, rhonchi, or rales noted.  Respiratory effort nonlabored Abd: soft, exquisitely tender in the  RLQ at [facility] point, ND, +BS, no masses, hernias, or organomegaly, well healed groin scars MS: all 4 extremities are symmetrical with no cyanosis, clubbing, or edema. Skin: warm and dry with no masses, lesions, or rashes Psych: A&Ox3 with an appropriate affect.   Results for orders placed or performed during the hospital encounter of 09/05/14 (from the past 48 hour(s))  CBC with Differential     Status: None   Collection Time: 09/05/14 11:54 AM  Result Value Ref Range   WBC 7.7 4.0 - 10.5 K/uL   RBC 4.94 4.22 - 5.81 MIL/uL   Hemoglobin 14.7 13.0 - 17.0 g/dL   HCT 43.6 39.0 - 52.0 %   MCV 88.3 78.0 - 100.0 fL   MCH 29.8 26.0 - 34.0 pg   MCHC 33.7 30.0 - 36.0 g/dL   RDW 13.9 11.5 - 15.5 %   Platelets 203 150 - 400 K/uL   Neutrophils Relative % 75 43 - 77 %   Neutro Abs 5.8 1.7 - 7.7 K/uL   Lymphocytes Relative 17 12 - 46 %   Lymphs Abs 1.3 0.7 - 4.0 K/uL   Monocytes Relative 5 3 - 12 %   Monocytes Absolute 0.4 0.1 - 1.0 K/uL   Eosinophils Relative 3 0 - 5 %   Eosinophils Absolute 0.2 0.0 - 0.7 K/uL   Basophils Relative 0 0 - 1 %   Basophils Absolute 0.0 0.0 - 0.1 K/uL  Comprehensive metabolic panel  Status: Abnormal   Collection Time: 09/05/14 11:54 AM  Result Value Ref Range   Sodium 142 137 - 147 mEq/L   Potassium 4.2 3.7 - 5.3 mEq/L   Chloride 104 96 - 112 mEq/L   CO2 29 19 - 32 mEq/L   Glucose, Bld 114 (H) 70 - 99 mg/dL   BUN 11 6 - 23 mg/dL   Creatinine, Ser 0.85 0.50 - 1.35 mg/dL   Calcium 8.9 8.4 - 10.5 mg/dL   Total Protein 6.8 6.0 - 8.3 g/dL   Albumin 3.6 3.5 - 5.2 g/dL   AST 20 0 - 37 U/L   ALT 24 0 - 53 U/L   Alkaline Phosphatase 81 39 - 117 U/L   Total Bilirubin 0.6 0.3 - 1.2 mg/dL   GFR calc non Af Amer 84 (L) >90 mL/min   GFR calc Af Amer >90 >90 mL/min    Comment: (NOTE) The eGFR has been calculated using the CKD EPI equation. This calculation has not been validated in all clinical situations. eGFR's persistently <90 mL/min signify possible Chronic  Kidney Disease.    Anion gap 9 5 - 15  Lipase, blood     Status: None   Collection Time: 09/05/14 11:54 AM  Result Value Ref Range   Lipase 22 11 - 59 U/L  Urinalysis, Routine w reflex microscopic     Status: None   Collection Time: 09/05/14 12:18 PM  Result Value Ref Range   Color, Urine YELLOW YELLOW   APPearance CLEAR CLEAR   Specific Gravity, Urine 1.007 1.005 - 1.030   pH 7.5 5.0 - 8.0   Glucose, UA NEGATIVE NEGATIVE mg/dL   Hgb urine dipstick NEGATIVE NEGATIVE   Bilirubin Urine NEGATIVE NEGATIVE   Ketones, ur NEGATIVE NEGATIVE mg/dL   Protein, ur NEGATIVE NEGATIVE mg/dL   Urobilinogen, UA 0.2 0.0 - 1.0 mg/dL   Nitrite NEGATIVE NEGATIVE   Leukocytes, UA NEGATIVE NEGATIVE    Comment: MICROSCOPIC NOT DONE ON URINES WITH NEGATIVE PROTEIN, BLOOD, LEUKOCYTES, NITRITE, OR GLUCOSE <1000 mg/dL.   Ct Abdomen Pelvis W Contrast  09/05/2014   CLINICAL DATA:  Right lower quadrant abdominal pain  EXAM: CT ABDOMEN AND PELVIS WITH CONTRAST  TECHNIQUE: Multidetector CT imaging of the abdomen and pelvis was performed using the standard protocol following bolus administration of intravenous contrast.  CONTRAST:  136m OMNIPAQUE IOHEXOL 300 MG/ML SOLN, 260mOMNIPAQUE IOHEXOL 300 MG/ML SOLN  COMPARISON:  02/02/2014  FINDINGS: Dependent bibasilar atelectasis and right lower lobe scarring noted.  Numerous low-density hepatic lesions are reidentified, most of which are compatible with cysts or potentially biliary hamartomas. A wedge-shaped hypodense lesion measuring 1.6 cm in the posterior segment right hepatic lobe image 31 is reidentified which does not appear typical for a cyst but is not further characterized and appear stable. Gallbladder, right adrenal gland, spleen, pancreas appear unremarkable. 1.3 cm left adrenal nodule is larger, image 30. Numerous low-density bilateral renal cortical hypodense lesions are reidentified with large 7.6 cm left upper pole parapelvic cyst noted. The ureter is seen  inferior to this parapelvic cyst image 51. There is mild prominence of the left intrarenal collecting system which could reflect mass effect upon the ureter from the left upper pole parapelvic cyst. Nonobstructing 1 mm right upper renal pole calculus image 35.  No lymphadenopathy.  No free air.  No ascites.  Colonic diverticuli noted without evidence for diverticulitis. The appendix is dilated with mural hyper enhancement and surrounding stranding, maximal diameter 1.3 cm image 66. No surrounding  fluid collection.  Prostatomegaly measuring 5.7 cm image 86. Bladder is normal. Moderate atheromatous aortic calcification without aneurysm. Stranding within the anterior abdominal wall subcutaneous fat may indicate injection sites.  Degenerative change noted in the spine.  IMPRESSION: Findings compatible with acute appendicitis without surrounding fluid collection or free air.  Left adrenal nodule is increased in size. Further outpatient nonemergent characterization with abdominal MRI with contrast is recommended. This would also be helpful to definitively characterize the right hepatic lobe mass which is not definitively a cyst based on the current or prior exam.  These results were called by telephone at the time of interpretation on 09/05/2014 at 2:08 pm to Dr. Debby Freiberg , who verbally acknowledged these results.   Electronically Signed   By: Conchita Paris M.D.   On: 09/05/2014 14:10      Assessment/Plan Acute Appendicitis  Plan: 1.  Admit to [facility], urgent lap appy 2.  NPO, bowel rest, IVF, pain control, antiemetics, antibiotics (Cefoxitin) 3.  Pt will go directly to OR today, discussed risks/benefits of surgical interventions.  The patient wishes to proceed.   [name], [facility] 09/05/2014, 2:40 PM Pager: [phone]

## 2014-09-06 ENCOUNTER — Encounter (HOSPITAL_COMMUNITY): Payer: Self-pay | Admitting: Student

## 2014-09-06 DIAGNOSIS — K3580 Unspecified acute appendicitis: Secondary | ICD-10-CM | POA: Diagnosis not present

## 2014-09-06 MED ORDER — HYDROCODONE-ACETAMINOPHEN 5-325 MG PO TABS: 1.0000 | ORAL_TABLET | ORAL | Status: DC | PRN

## 2014-09-06 NOTE — Progress Notes (Signed)
Discharge instructions and prescription for vicodin explained and given to pt and pt's wife.  Both verbalize understanding of all orders/instructions and deny any questions at this time.  IV removed by nursing student and sight is CDI.  VSS, pt in no s/s of distress. Pt discharged to home with wife with all belongings via volunteer w/c services. Syliva Overman

## 2014-09-06 NOTE — Discharge Summary (Signed)
Physician Discharge Summary  [name] XWR:[id number] DOB: [birth year] DOA: 09/05/2014  PCP: Tawanna Solo, MD  Admit date: 09/05/2014 Discharge date: 09/06/2014  Recommendations for Outpatient Follow-up:  1.   Follow-up Information    Follow up with [facility] On 09/28/2014.   Why:  2:30pm, arrive no later than 2:00pm for paperwork   Contact information:   [address] [place]   [place] [address] [phone]      Discharge Diagnoses:  1. Acute appendicitis  Surgical Procedure: Lap Appendectomy Dr Ninfa Linden 09/05/14  Discharge Condition: good Disposition: home  Diet recommendation: regular  Filed Weights   09/05/14 1151 09/05/14 1738  Weight: 200 lb (90.719 kg) 200 lb (90.719 kg)    History of present illness:  73 y/o white male with h/o b/l inguinal hernia repairs, HLD, palpitations treated with an ablation, hyperh/o diverticulitis treated conservatively presents to [facility] with acute onset of severe sharp RLQ abdominal pain. He denies N/V/D, no CP/SOB, fever/chills. Last time he felt this pain was during a diverticulitis episode. No precipitating or alleviating factors. Pain was 10/10, but much less now. No radiation pain.  Hospital Course:  Pt was admitted, started on IV abx and taken to OR for lap appy by Dr Ninfa Linden on 11/8. He did well. Please see op note for further details. On pod 1 he was ambulating without difficulty. He was tolerating a diet - ate entire meal. His pain was controlled. He had no nausea/vomiting/lightheadedness/dizziness. He had voided. He just had some abdominal soreness.   BP 90/50 mmHg  Pulse 66  Temp(Src) 97.9 F (36.6 C) (Oral)  Resp 16  Ht 6' (1.829 m)  Wt 200 lb (90.719 kg)  BMI 27.12 kg/m2  SpO2 95%  Gen: alert, NAD, non-toxic appearing Pupils: equal, no scleral icterus Pulm: Lungs clear to auscultation, symmetric chest rise CV: regular rate and rhythm Abd: soft,  Mild tenderness, nondistended.  No  cellulitis. No incisional hernia Ext: no edema, no calf tenderness Skin: no rash, no jaundice     Discharge Instructions      Discharge Instructions    Diet general    Complete by:  As directed      Discharge instructions    Complete by:  As directed   See [facility] discharge instructions     Increase activity slowly    Complete by:  As directed             Medication List    TAKE these medications        aspirin 81 MG EC tablet  Take 81 mg by mouth daily.     atorvastatin 10 MG tablet  Commonly known as:  LIPITOR  Take 10 mg by mouth daily.     b complex vitamins tablet  Take 1 tablet by mouth daily.     CALCIUM 600+D 600-400 MG-UNIT per tablet  Generic drug:  Calcium Carbonate-Vitamin D  Take 1 tablet by mouth daily.     finasteride 5 MG tablet  Commonly known as:  PROSCAR  Take 5 mg by mouth daily.     gabapentin 400 MG capsule  Commonly known as:  NEURONTIN  Take 400 mg by mouth 3 (three) times daily.     GLUCOSAMINE-CHONDROITIN-VIT C PO  Take 1 tablet by mouth daily.     HYDROcodone-acetaminophen 5-325 MG per tablet  Commonly known as:  NORCO/VICODIN  Take 1-2 tablets by mouth every 4 (four) hours as needed for moderate pain.  multivitamin tablet  Take 1 tablet by mouth daily.     psyllium 28 % packet  Commonly known as:  METAMUCIL SMOOTH TEXTURE  Take 1 packet by mouth 2 (two) times daily.     RAPAFLO 8 MG Caps capsule  Generic drug:  silodosin  Take 8 mg by mouth daily with breakfast.     vitamin C 1000 MG tablet  Take 1,000 mg by mouth daily.       Follow-up Information    Follow up with [facility] On 09/28/2014.   Why:  2:30pm, arrive no later than 2:00pm for paperwork   Contact information:   [address] [place]   [place] [address] [phone]        The results of significant diagnostics from this hospitalization (including imaging, microbiology, ancillary and laboratory) are listed below for reference.     Significant Diagnostic Studies: Ct Abdomen Pelvis W Contrast  09/05/2014   CLINICAL DATA:  Right lower quadrant abdominal pain  EXAM: CT ABDOMEN AND PELVIS WITH CONTRAST  TECHNIQUE: Multidetector CT imaging of the abdomen and pelvis was performed using the standard protocol following bolus administration of intravenous contrast.  CONTRAST:  124mL OMNIPAQUE IOHEXOL 300 MG/ML SOLN, 8mL OMNIPAQUE IOHEXOL 300 MG/ML SOLN  COMPARISON:  02/02/2014  FINDINGS: Dependent bibasilar atelectasis and right lower lobe scarring noted.  Numerous low-density hepatic lesions are reidentified, most of which are compatible with cysts or potentially biliary hamartomas. A wedge-shaped hypodense lesion measuring 1.6 cm in the posterior segment right hepatic lobe image 31 is reidentified which does not appear typical for a cyst but is not further characterized and appear stable. Gallbladder, right adrenal gland, spleen, pancreas appear unremarkable. 1.3 cm left adrenal nodule is larger, image 30. Numerous low-density bilateral renal cortical hypodense lesions are reidentified with large 7.6 cm left upper pole parapelvic cyst noted. The ureter is seen inferior to this parapelvic cyst image 51. There is mild prominence of the left intrarenal collecting system which could reflect mass effect upon the ureter from the left upper pole parapelvic cyst. Nonobstructing 1 mm right upper renal pole calculus image 35.  No lymphadenopathy.  No free air.  No ascites.  Colonic diverticuli noted without evidence for diverticulitis. The appendix is dilated with mural hyper enhancement and surrounding stranding, maximal diameter 1.3 cm image 66. No surrounding fluid collection.  Prostatomegaly measuring 5.7 cm image 86. Bladder is normal. Moderate atheromatous aortic calcification without aneurysm. Stranding within the anterior abdominal wall subcutaneous fat may indicate injection sites.  Degenerative change noted in the spine.  IMPRESSION: Findings  compatible with acute appendicitis without surrounding fluid collection or free air.  Left adrenal nodule is increased in size. Further outpatient nonemergent characterization with abdominal MRI with contrast is recommended. This would also be helpful to definitively characterize the right hepatic lobe mass which is not definitively a cyst based on the current or prior exam.  These results were called by telephone at the time of interpretation on 09/05/2014 at 2:08 pm to Dr. Debby Freiberg , who verbally acknowledged these results.   Electronically Signed   By: Conchita Paris M.D.   On: 09/05/2014 14:10    Microbiology: No results found for this or any previous visit (from the past 240 hour(s)).   Labs: Basic Metabolic Panel:  Recent Labs Lab 09/05/14 1154  NA 142  K 4.2  CL 104  CO2 29  GLUCOSE 114*  BUN 11  CREATININE 0.85  CALCIUM  8.9   Liver Function Tests:  Recent Labs Lab 09/05/14 1154  AST 20  ALT 24  ALKPHOS 81  BILITOT 0.6  PROT 6.8  ALBUMIN 3.6    Recent Labs Lab 09/05/14 1154  LIPASE 22   No results for input(s): AMMONIA in the last 168 hours. CBC:  Recent Labs Lab 09/05/14 1154  WBC 7.7  NEUTROABS 5.8  HGB 14.7  HCT 43.6  MCV 88.3  PLT 203   Cardiac Enzymes: No results for input(s): CKTOTAL, CKMB, CKMBINDEX, TROPONINI in the last 168 hours. BNP: BNP (last 3 results) No results for input(s): PROBNP in the last 8760 hours. CBG: No results for input(s): GLUCAP in the last 168 hours.  Active Problems:   Appendicitis   Time coordinating discharge: 15 minutes  Signed:  Gayland Curry, MD [facility], [place] 09/06/2014, 11:27 PM

## 2014-09-06 NOTE — Discharge Instructions (Signed)
[facility]: POST OP INSTRUCTIONS Always review your discharge instruction sheet given to you by the facility where your surgery was performed. IF YOU HAVE DISABILITY OR FAMILY LEAVE FORMS, YOU MUST BRING THEM TO THE OFFICE FOR PROCESSING.   DO NOT GIVE THEM TO YOUR DOCTOR.  1. A prescription for pain medication may be given to you upon discharge.  Take your pain medication as prescribed, if needed.  If narcotic pain medicine is not needed, then you may take acetaminophen (Tylenol) or ibuprofen (Advil) as needed. 2. Take your usually prescribed medications unless otherwise directed. 3. If you need a refill on your pain medication, please contact your pharmacy.  They will contact our office to request authorization. Prescriptions will not be filled after 5pm or on week-ends. 4. You should follow a light diet the first few days after arrival home, such as soup and crackers, etc.  Be sure to include lots of fluids daily. 5. Most patients will experience some swelling and bruising in the area of the incisions.  Ice packs will help.  Swelling and bruising can take several days to resolve.  6. It is common to experience some constipation if taking pain medication after surgery.  Increasing fluid intake and taking a stool softener (such as Colace) will usually help or prevent this problem from occurring.  A mild laxative (Milk of Magnesia or Miralax) should be taken according to package instructions if there are no bowel movements after 48 hours. 7. Unless discharge instructions indicate otherwise, you may remove your bandages 24-48 hours after surgery, and you may shower at that time.  You may have steri-strips (small skin tapes) in place directly over the incision.  These strips should be left on the skin for 7-10 days.  If your surgeon used skin glue on the incision, you may shower in 24 hours.  The glue will flake off over the next 2-3 weeks.  Any sutures or  staples will be removed at the office during your follow-up visit. 8. ACTIVITIES:  You may resume regular (light) daily activities beginning the next day--such as daily self-care, walking, climbing stairs--gradually increasing activities as tolerated.  You may have sexual intercourse when it is comfortable.  Refrain from any heavy lifting or straining until approved by your doctor. a. You may drive when you are no longer taking prescription pain medication, you can comfortably wear a seatbelt, and you can safely maneuver your car and apply brakes. 9. You should see your doctor in the office for a follow-up appointment approximately 2-3 weeks after your surgery.  Make sure that you call for this appointment within a day or two after you arrive home to insure a convenient appointment time. 10. OTHER INSTRUCTIONS:  WHEN TO CALL YOUR DOCTOR: 1. Fever over 101.0 2. Inability to urinate 3. Continued bleeding from incision. 4. Increased pain, redness, or drainage from the incision. 5. Increasing abdominal pain  The clinic staff is available to answer your questions during regular business hours.  Please dont hesitate to call and ask to speak to one of the nurses for clinical concerns.  If you have a medical emergency, go to the nearest emergency room or call 911.  A surgeon from [facility] is always on call at the hospital. [address], [place], [place], [place]  [address] ? [address] [place], [place], [place]   [address] [phone] ? [phone] ? FAX [phone] Web site: [email or web address]

## 2014-09-07 ENCOUNTER — Encounter (HOSPITAL_COMMUNITY): Payer: Self-pay | Admitting: Surgery

## 2014-09-07 ENCOUNTER — Encounter: Payer: Medicare Other | Admitting: Physical Therapy

## 2014-09-09 ENCOUNTER — Encounter: Payer: Medicare Other | Admitting: Physical Therapy

## 2014-09-13 ENCOUNTER — Other Ambulatory Visit: Payer: Self-pay | Admitting: Dermatology

## 2014-09-13 DIAGNOSIS — B079 Viral wart, unspecified: Secondary | ICD-10-CM | POA: Diagnosis not present

## 2014-09-13 DIAGNOSIS — L570 Actinic keratosis: Secondary | ICD-10-CM | POA: Diagnosis not present

## 2014-09-13 DIAGNOSIS — D485 Neoplasm of uncertain behavior of skin: Secondary | ICD-10-CM | POA: Diagnosis not present

## 2014-09-14 DIAGNOSIS — Z9049 Acquired absence of other specified parts of digestive tract: Secondary | ICD-10-CM | POA: Diagnosis not present

## 2014-09-22 NOTE — ED Provider Notes (Signed)
I was notified of change of CT read involving LUQ mass.  I called the pt and informed him.  He has fu 12/1.  He will fu.    Debby Freiberg, MD 09/22/14 [phone]

## 2014-09-27 DIAGNOSIS — M2042 Other hammer toe(s) (acquired), left foot: Secondary | ICD-10-CM | POA: Diagnosis not present

## 2014-09-27 DIAGNOSIS — M2041 Other hammer toe(s) (acquired), right foot: Secondary | ICD-10-CM | POA: Diagnosis not present

## 2014-09-27 DIAGNOSIS — G629 Polyneuropathy, unspecified: Secondary | ICD-10-CM | POA: Diagnosis not present

## 2014-09-28 ENCOUNTER — Other Ambulatory Visit (HOSPITAL_COMMUNITY): Payer: Self-pay | Admitting: Family Medicine

## 2014-09-28 DIAGNOSIS — R1902 Left upper quadrant abdominal swelling, mass and lump: Secondary | ICD-10-CM

## 2014-10-04 DIAGNOSIS — M2042 Other hammer toe(s) (acquired), left foot: Secondary | ICD-10-CM | POA: Diagnosis not present

## 2014-10-04 DIAGNOSIS — M2041 Other hammer toe(s) (acquired), right foot: Secondary | ICD-10-CM | POA: Diagnosis not present

## 2014-10-06 ENCOUNTER — Ambulatory Visit (HOSPITAL_COMMUNITY)
Admission: RE | Admit: 2014-10-06 | Discharge: 2014-10-06 | Disposition: A | Discharge status: Home / Self Care | Payer: Medicare Other | Source: Ambulatory Visit | Attending: Family Medicine | Admitting: Family Medicine

## 2014-10-06 DIAGNOSIS — R911 Solitary pulmonary nodule: Secondary | ICD-10-CM | POA: Insufficient documentation

## 2014-10-06 DIAGNOSIS — R1902 Left upper quadrant abdominal swelling, mass and lump: Secondary | ICD-10-CM | POA: Diagnosis not present

## 2014-10-06 DIAGNOSIS — E279 Disorder of adrenal gland, unspecified: Secondary | ICD-10-CM | POA: Diagnosis not present

## 2014-10-06 DIAGNOSIS — K5730 Diverticulosis of large intestine without perforation or abscess without bleeding: Secondary | ICD-10-CM | POA: Insufficient documentation

## 2014-10-06 DIAGNOSIS — C3411 Malignant neoplasm of upper lobe, right bronchus or lung: Secondary | ICD-10-CM | POA: Diagnosis not present

## 2014-10-06 LAB — GLUCOSE, CAPILLARY: Glucose-Capillary: 100 mg/dL — ABNORMAL HIGH (ref 70–99)

## 2014-10-06 MED ORDER — FLUDEOXYGLUCOSE F - 18 (FDG) INJECTION
9.9000 | Freq: Once | INTRAVENOUS | Status: AC | PRN
  Administered 2014-10-06: 9.9 via INTRAVENOUS

## 2014-10-14 DIAGNOSIS — R350 Frequency of micturition: Secondary | ICD-10-CM | POA: Diagnosis not present

## 2014-10-14 DIAGNOSIS — N401 Enlarged prostate with lower urinary tract symptoms: Secondary | ICD-10-CM | POA: Diagnosis not present

## 2014-10-18 ENCOUNTER — Encounter: Payer: Self-pay | Admitting: *Deleted

## 2014-10-18 NOTE — Progress Notes (Signed)
I received referral on the above patient today.  I spoke with Dr. Julien Nordmann about patient.  He asked that the patient be set up with pulmonary.  I will refer.  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

## 2014-10-20 ENCOUNTER — Telehealth: Payer: Self-pay | Admitting: Internal Medicine

## 2014-10-20 NOTE — Telephone Encounter (Signed)
S/W PATIENT AND GAVE NP APPT FOR 1/05 @ 11 W/DR. MOHAMED REFERRING DR. Lennette Bihari LITTLE DX-LUNG MASS AND ABD MASS ON PET

## 2014-10-26 ENCOUNTER — Ambulatory Visit (INDEPENDENT_AMBULATORY_CARE_PROVIDER_SITE_OTHER): Payer: Medicare Other | Admitting: Internal Medicine

## 2014-10-26 ENCOUNTER — Other Ambulatory Visit (INDEPENDENT_AMBULATORY_CARE_PROVIDER_SITE_OTHER): Payer: Medicare Other

## 2014-10-26 ENCOUNTER — Encounter: Payer: Self-pay | Admitting: Internal Medicine

## 2014-10-26 VITALS — BP 116/70 | HR 78 | Temp 97.6°F | Ht 72.0 in | Wt 207.8 lb

## 2014-10-26 DIAGNOSIS — R911 Solitary pulmonary nodule: Secondary | ICD-10-CM | POA: Diagnosis not present

## 2014-10-26 DIAGNOSIS — J449 Chronic obstructive pulmonary disease, unspecified: Secondary | ICD-10-CM | POA: Diagnosis not present

## 2014-10-26 LAB — BASIC METABOLIC PANEL
BUN: 12 mg/dL (ref 6–23)
CO2: 32 mEq/L (ref 19–32)
Calcium: 9 mg/dL (ref 8.4–10.5)
Chloride: 106 mEq/L (ref 96–112)
Creatinine, Ser: 0.8 mg/dL (ref 0.4–1.5)
GFR: 95.97 mL/min (ref >60.00)
Glucose, Bld: 119 mg/dL — ABNORMAL HIGH (ref 70–99)
Potassium: 4 mEq/L (ref 3.5–5.1)
Sodium: 143 mEq/L (ref 135–145)

## 2014-10-26 NOTE — Progress Notes (Signed)
Subjective:     Patient ID: [name], male   DOB: Mar 19, 1941    MRN: [id number]  HPI  65 yowm quit smoking 2003 from near [place] in [place] in 1996 with  so appendectomy Sep 05 2014 referred 10/26/2014 by Dr Lawernce Pitts to pulmonary clinic for  ? SPN by PET scan done to eval abd mass.    10/26/2014 1st [facility] office visit/ [name]   Chief Complaint  Patient presents with  . Advice Only    Referred by Dr. Rex Kras; abnormal PET scan results.    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 GIST tumor?.   He has no hx to suggest pna or flare of bronchitis over the last sev months although is s/p appy 09/05/14   No obvious day to day or daytime variabilty or assoc chronic cough or cp or chest tightness, subjective wheeze overt sinus or hb symptoms. No unusual exp hx or h/o childhood pna/ asthma or knowledge of premature birth.  Sleeping ok without nocturnal  or early am exacerbation  of respiratory  c/o's or need for noct saba. Also denies any obvious fluctuation of symptoms with weather or environmental changes or other aggravating or alleviating factors except as outlined above   Current Medications, Allergies, Complete Past Medical History, Past Surgical History, Family History, and Social History were reviewed in [employer] record.  ROS  The following are not active complaints unless bolded sore throat, dysphagia, dental problems, itching, sneezing,  nasal congestion or excess/ purulent secretions, ear ache,   fever, chills, sweats, unintended wt loss, pleuritic or exertional cp, hemoptysis,  orthopnea pnd or leg swelling, presyncope, palpitations, heartburn, abdominal pain, anorexia, nausea, vomiting, diarrhea  or  change in bowel or urinary habits, change in stools or urine, dysuria,hematuria,  rash, arthralgias, visual complaints, headache, numbness weakness or ataxia or problems with walking or coordination,  change in mood/affect or memory.          Review of Systems     Objective:   Physical Exam    pleasant amb wm nad  Wt Readings from Last 3 Encounters:  10/26/14 207 lb 12.8 oz (94.257 kg)  09/05/14 200 lb (90.719 kg)  02/15/11 203 lb (92.08 kg)    Vital signs reviewed  HEENT: nl dentition, turbinates, and orophanx. Nl external ear canals without cough reflex   NECK :  without JVD/Nodes/TM/ nl carotid upstrokes bilaterally   LUNGS: no acc muscle use, clear to A and P bilaterally without cough on insp or exp maneuvers   CV:  RRR  no s3 or murmur or increase in P2, no edema   ABD:  soft and nontender with nl excursion in the supine position. No bruits or organomegaly, bowel sounds nl  MS:  warm without deformities, calf tenderness, cyanosis or clubbing  SKIN: warm and dry without lesions    NEURO:  alert, approp, no deficits     Assessment:

## 2014-10-26 NOTE — Patient Instructions (Signed)
Please see patient coordinator before you leave today  to schedule a Chest CT

## 2014-10-27 ENCOUNTER — Ambulatory Visit (INDEPENDENT_AMBULATORY_CARE_PROVIDER_SITE_OTHER)
Admission: RE | Admit: 2014-10-27 | Discharge: 2014-10-27 | Disposition: A | Discharge status: Home / Self Care | Payer: Medicare Other | Source: Ambulatory Visit | Attending: Internal Medicine | Admitting: Internal Medicine

## 2014-10-27 DIAGNOSIS — R911 Solitary pulmonary nodule: Secondary | ICD-10-CM

## 2014-10-27 DIAGNOSIS — J439 Emphysema, unspecified: Secondary | ICD-10-CM | POA: Diagnosis not present

## 2014-10-27 DIAGNOSIS — J449 Chronic obstructive pulmonary disease, unspecified: Secondary | ICD-10-CM | POA: Insufficient documentation

## 2014-10-27 DIAGNOSIS — K7689 Other specified diseases of liver: Secondary | ICD-10-CM | POA: Diagnosis not present

## 2014-10-27 DIAGNOSIS — R1902 Left upper quadrant abdominal swelling, mass and lump: Secondary | ICD-10-CM | POA: Diagnosis not present

## 2014-10-27 MED ORDER — IOHEXOL 300 MG/ML  SOLN
80.0000 mL | Freq: Once | INTRAMUSCULAR | Status: AC | PRN
  Administered 2014-10-27: 80 mL via INTRAVENOUS

## 2014-10-27 NOTE — Assessment & Plan Note (Addendum)
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 > CT chest ordered 10/26/14 >>> - spirometry 10/26/14 FEV1  2.49(70%) raqtio 59 off all rx    I had an extended discussion with the patient and wife  reviewing all relevant studies completed to date    Discussed in detail all the  indications, usual  risks and alternatives  relative to the benefits with patient who agrees to proceed with proceeding first with ct chest and then consider excisional bx/ lobectomy which he would tolerate well  I have major concerns about false positives in pts recently acutely ill as was the case for his abd issues 6 weeks ago but he has had nothing to suggest any pulmonary complications from the surgery and he is a former smoker with copd so this is a neoplasm resectable for cure until proven otherwise

## 2014-10-27 NOTE — Assessment & Plan Note (Addendum)
-    Spirometry 10/26/14  FEV1  2.49   (70%)  ratio 59   He is completely asymptomatic and could easily tolerated RULobectomy if need be so if ct confirms this is a SPN will refer directly to T surgery    reviewed study with pt and explained >     the Flethcher curve with patient that basically indicates  if you quit smoking when your best day FEV1 is still well preserved (as is the case here)  it is highly unlikely you will progress to severe disease and informed the patient there was no medication on the market that has proven to change the curve or the likelihood of progression.  Therefore stopping smoking and maintaining abstinence is the most important aspect of care, not choice of inhalers or for that matter, doctors >>>    No medications needed at this point, might benefit from duoneb periop

## 2014-10-27 NOTE — Progress Notes (Signed)
Quick Note:  Spoke with pt and notified of results per Dr. Wert. Pt verbalized understanding and denied any questions.  ______ 

## 2014-10-29 HISTORY — PX: ROTATOR CUFF REPAIR: SHX139

## 2014-11-02 ENCOUNTER — Encounter: Payer: Self-pay | Admitting: Internal Medicine

## 2014-11-02 ENCOUNTER — Ambulatory Visit: Payer: Medicare Other

## 2014-11-02 ENCOUNTER — Other Ambulatory Visit: Payer: Self-pay | Admitting: Internal Medicine

## 2014-11-02 ENCOUNTER — Telehealth: Payer: Self-pay | Admitting: Internal Medicine

## 2014-11-02 ENCOUNTER — Other Ambulatory Visit (HOSPITAL_BASED_OUTPATIENT_CLINIC_OR_DEPARTMENT_OTHER): Payer: Medicare Other

## 2014-11-02 ENCOUNTER — Ambulatory Visit (HOSPITAL_BASED_OUTPATIENT_CLINIC_OR_DEPARTMENT_OTHER): Payer: Medicare Other | Admitting: Internal Medicine

## 2014-11-02 VITALS — BP 126/87 | HR 78 | Temp 97.7°F | Resp 18 | Ht 72.0 in | Wt 207.3 lb

## 2014-11-02 DIAGNOSIS — K3189 Other diseases of stomach and duodenum: Secondary | ICD-10-CM | POA: Insufficient documentation

## 2014-11-02 DIAGNOSIS — R911 Solitary pulmonary nodule: Secondary | ICD-10-CM

## 2014-11-02 DIAGNOSIS — R1902 Left upper quadrant abdominal swelling, mass and lump: Secondary | ICD-10-CM | POA: Diagnosis not present

## 2014-11-02 LAB — CBC WITH DIFFERENTIAL/PLATELET
BASO%: 0.4 % (ref 0.0–2.0)
Basophils Absolute: 0 10*3/uL (ref 0.0–0.1)
EOS%: 3.4 % (ref 0.0–7.0)
Eosinophils Absolute: 0.2 10*3/uL (ref 0.0–0.5)
HCT: 45.6 % (ref 38.4–49.9)
HGB: 15 g/dL (ref 13.0–17.1)
LYMPH%: 24.2 % (ref 14.0–49.0)
MCH: 29.3 pg (ref 27.2–33.4)
MCHC: 32.9 g/dL (ref 32.0–36.0)
MCV: 89.1 fL (ref 79.3–98.0)
MONO#: 0.5 10*3/uL (ref 0.1–0.9)
MONO%: 9.8 % (ref 0.0–14.0)
NEUT#: 3.1 10*3/uL (ref 1.5–6.5)
NEUT%: 62.2 % (ref 39.0–75.0)
Platelets: 201 10*3/uL (ref 140–400)
RBC: 5.12 10*6/uL (ref 4.20–5.82)
RDW: 13.9 % (ref 11.0–14.6)
WBC: 5 10*3/uL (ref 4.0–10.3)
lymph#: 1.2 10*3/uL (ref 0.9–3.3)

## 2014-11-02 LAB — LACTATE DEHYDROGENASE (CC13): LDH: 190 U/L (ref 125–245)

## 2014-11-02 LAB — COMPREHENSIVE METABOLIC PANEL (CC13)
ALT: 24 U/L (ref 0–55)
AST: 21 U/L (ref 5–34)
Albumin: 3.9 g/dL (ref 3.5–5.0)
Alkaline Phosphatase: 79 U/L (ref 40–150)
Anion Gap: 9 mEq/L (ref 3–11)
BUN: 10 mg/dL (ref 7.0–26.0)
CO2: 31 mEq/L — ABNORMAL HIGH (ref 22–29)
Calcium: 9.3 mg/dL (ref 8.4–10.4)
Chloride: 105 mEq/L (ref 98–109)
Creatinine: 0.8 mg/dL (ref 0.7–1.3)
EGFR: 87 mL/min/{1.73_m2} — ABNORMAL LOW (ref >90)
Glucose: 83 mg/dl (ref 70–140)
Potassium: 3.6 mEq/L (ref 3.5–5.1)
Sodium: 145 mEq/L (ref 136–145)
Total Bilirubin: 0.85 mg/dL (ref 0.20–1.20)
Total Protein: 7.1 g/dL (ref 6.4–8.3)

## 2014-11-02 NOTE — Telephone Encounter (Addendum)
Pt confirmed labs/ov per 01/05 POF, gave pt AVS.... KJ, sch pt with [facility] Dr. [name] on 01/12 at 11:00 sent msg to fax information over to MR, lft msg for pt confirming apt with [facility] for his consultation...Marland KitchenMarland Kitchen

## 2014-11-02 NOTE — Progress Notes (Signed)
[place] Telephone:([phone]   Fax:([phone]) [phone]  CONSULT NOTE  REFERRING PHYSICIAN: Dr. Hulan Fess  REASON FOR CONSULTATION:  74 years old white male with lung and abdominal mass.  HPI [name] is a 74 y.o. male with past medical history significant for osteoarthritis, testicular atrophy, erectile dysfunction, diverticulosis, hernia repair as well as cardiac ablation. The patient mentions that he was in [place] for a vacation in March 2015 when he started having abdominal pain and was diagnosed at that time was abdominal abscesses that was treated with a course of IV antibiotics. CT of the abdomen at that time showed abdominal mass. He was advised to check with his primary care physician for repeat CT scan of the abdomen and pelvis after returning to [facility]. CT scan of the abdomen and pelvis performed on 02/02/2014 showed multiple diverticula throughout the descending and rectosigmoid colon with no evidence of diverticulitis or abscess. There was suspected patchy right lower lobe pneumonia. On 09/05/2014 the patient presented to the emergency Department at [facility] complaining of acute onset abdominal pain. He was diagnosed with acute appendicitis. The patient underwent laparoscopic appendectomy under the care of Dr. Ninfa Linden. The final pathology showed acute suppurative appendicitis.  CT of the abdomen and pelvis at that time showed the findings compatible with acute appendicitis without surrounding fluid collection or free air. There was left adrenal nodule increased in size. There was also an irregular soft tissue mass high in the left upper quadrant abutting the left hemidiaphragm and near the fundus of the stomach. 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 A PET scan was recommended and this was performed on 10/06/2014. It showed low level FDG uptake associated with the  irregular soft tissue attenuating structure in the left upper quadrant. The activity to slightly above splenic activity and this is a nonspecific finding and could represent a benign splenule or a malignancy like GIST tumor. There was also sub-solid nodule within the right upper lobe exhibit malignant change FDG uptake. No significant FDG uptake associated with a small left adrenal nodule. The patient was referred to Dr. Melvyn Novas for evaluation of the pulmonary nodule and repeat CT scan of the chest on 10/27/2014 showed a stable appearance of the soft tissue attenuating mass within the left upper quadrant of the abdomen adjacent to the left hemidiaphragm. There was no acute findings within the chest. The patient was referred to me today for evaluation and recommendation regarding these abnormality on his scan. When seen today he has no complaints he is feeling fine. He denied having any significant chest pain, shortness breath, cough or hemoptysis. The patient denied having any significant weight loss or night sweats. He denied having any headache or visual changes. His family history significant for mother with a stroke and father had pancreatic cancer at age 82. The patient is married and has 3 daughters. He was accompanied today by his wife [name]. 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 He has a history of smoking but quit 12 years ago. He drinks alcohol occasionally and no history of drug abuse. HPI  Past Medical History  Diagnosis Date  . Osteoarthritis   . Tendinitis     achiles heel spur  . Testicular atrophy   . ED (erectile dysfunction)   . History of colon polyps   . Diverticulosis     extensive  . Palpitation  with svt rate 150  . Hyperlipidemia     Past Surgical History  Procedure Laterality Date  . Laparoscopic appendectomy N/A 09/05/2014    Procedure: APPENDECTOMY LAPAROSCOPIC;  Surgeon: [name], MD;  Location: [facility] OR;  Service: General;  Laterality: N/A;     Family History  Problem Relation Age of Onset  . Pancreatic cancer Father   . Aneurysm Brother     Social History History  Substance Use Topics  . Smoking status: Former Smoker -- 1.00 packs/day for 47 years    Types: Cigarettes    Quit date: 10/29/2000  . Smokeless tobacco: Former Systems developer  . Alcohol Use: 0.0 oz/week    0 Not specified per week     Comment: socially    No Known Allergies  Current Outpatient Prescriptions  Medication Sig Dispense Refill  . Ascorbic Acid (VITAMIN C) 1000 MG tablet Take 1,000 mg by mouth daily.    Marland Kitchen aspirin 81 MG EC tablet Take 81 mg by mouth daily.      Marland Kitchen atorvastatin (LIPITOR) 10 MG tablet Take 10 mg by mouth daily.     Marland Kitchen b complex vitamins tablet Take 1 tablet by mouth daily.    . Calcium Carbonate-Vitamin D (CALCIUM 600+D) 600-400 MG-UNIT per tablet Take 1 tablet by mouth daily.      . finasteride (PROSCAR) 5 MG tablet Take 5 mg by mouth daily.   2  . gabapentin (NEURONTIN) 400 MG capsule Take 600 mg by mouth 3 (three) times daily.     Marland Kitchen GLUCOSAMINE-CHONDROITIN-VIT C PO Take 1 tablet by mouth daily.      . Multiple Vitamin (MULTIVITAMIN) tablet Take 1 tablet by mouth daily.      . psyllium (METAMUCIL SMOOTH TEXTURE) 28 % packet Take 1 packet by mouth daily.     Marland Kitchen RAPAFLO 8 MG CAPS capsule Take 8 mg by mouth daily with breakfast.      No current facility-administered medications for this visit.    Review of Systems  Constitutional: negative Eyes: negative Ears, nose, mouth, throat, and face: negative Respiratory: negative Cardiovascular: negative Gastrointestinal: negative Genitourinary:negative Integument/breast: negative Hematologic/lymphatic: negative Musculoskeletal:negative Neurological: negative Behavioral/Psych: negative Endocrine: negative Allergic/Immunologic: negative  Physical Exam  [id number], healthy, no distress, well nourished and well developed SKIN: skin color, texture, turgor are normal, no rashes or  significant lesions HEAD: Normocephalic, No masses, lesions, tenderness or abnormalities EYES: normal, PERRLA EARS: External ears normal, Canals clear OROPHARYNX:no exudate, no erythema and lips, buccal mucosa, and tongue normal  NECK: supple, no adenopathy, no JVD LYMPH:  no palpable lymphadenopathy, no hepatosplenomegaly LUNGS: clear to auscultation , and palpation HEART: regular rate & rhythm and no murmurs ABDOMEN:abdomen soft, non-tender, normal bowel sounds and no masses or organomegaly BACK: Back symmetric, no curvature., No CVA tenderness EXTREMITIES:no joint deformities, effusion, or inflammation, no edema, no skin discoloration  NEURO: alert & oriented x 3 with fluent speech, no focal motor/sensory deficits  PERFORMANCE STATUS: ECOG 0  LABORATORY DATA: Lab Results  Component Value Date   WBC 5.0 11/02/2014   HGB 15.0 11/02/2014   HCT 45.6 11/02/2014   MCV 89.1 11/02/2014   PLT 201 11/02/2014      Chemistry      Component Value Date/Time   NA 145 11/02/2014 1104   NA 143 10/26/2014 1521   K 3.6 11/02/2014 1104   K 4.0 10/26/2014 1521   CL 106 10/26/2014 1521   CO2 31* 11/02/2014 1104   CO2 32 10/26/2014 1521  BUN 10.0 11/02/2014 1104   BUN 12 10/26/2014 1521   CREATININE 0.8 11/02/2014 1104   CREATININE 0.8 10/26/2014 1521      Component Value Date/Time   CALCIUM 9.3 11/02/2014 1104   CALCIUM 9.0 10/26/2014 1521   ALKPHOS 79 11/02/2014 1104   ALKPHOS 81 09/05/2014 1154   AST 21 11/02/2014 1104   AST 20 09/05/2014 1154   ALT 24 11/02/2014 1104   ALT 24 09/05/2014 1154   BILITOT 0.85 11/02/2014 1104   BILITOT 0.6 09/05/2014 1154       RADIOGRAPHIC STUDIES: Ct Chest W Contrast  10/27/2014   CLINICAL DATA:  Evaluate mass within left upper quadrant.  EXAM: CT CHEST WITH CONTRAST  TECHNIQUE: Multidetector CT imaging of the chest was performed during intravenous contrast administration.  CONTRAST:  92mL OMNIPAQUE IOHEXOL 300 MG/ML  SOLN  COMPARISON:   02/02/2014.  FINDINGS: Mediastinum: The heart size appears normal. No pericardial effusion identified. The trachea appears patent and appears midline. The esophagus is unremarkable. No mediastinal or hilar adenopathy.  Lungs/Pleura: There is no pleural effusion identified. Mild changes of centrilobular emphysema. Previous sub solid nodule within the right upper lobe that has resolved in the interval compatible with a benign abnormality. Scar like densities are noted in both lower lobes. No suspicious pulmonary nodule or mass identified.  Upper Abdomen: There are numerous fluid attenuation structures within the liver which likely represent cysts. The largest is in the posterior right hepatic lobe measuring 2.1 cm, image 69/series 2. The visualized portions of the gallbladder are unremarkable. The visualized portions of the pancreas and spleen are also normal.  The soft tissue attenuating mass along the undersurface of the left hemidiaphragm within the left upper quadrant is again noted. This measures 3.8 x 3.2 cm, image 44 of series 602. On the previous exam this measured 4.0 x 3.1 cm. No new mass is identified.  Musculoskeletal: Review of the visualized bony structure is unremarkable.  IMPRESSION: 1. No acute findings within the chest. 2. Stable appearance of soft tissue attenuating mass within the left upper quadrant of the abdomen adjacent to the left hemidiaphragm. Eight months of stability is a reassuring feature. Continued followup is recommended to ensure benignity. The next followup exam should be obtained at 12 months. 3. Liver cysts.   Electronically Signed   By: Kerby Moors M.D.   On: 10/27/2014 15:46   Nm Pet Image Initial (pi) Skull Base To Thigh  10/06/2014   CLINICAL DATA:  Initial treatment strategy for abdominal mass.  EXAM: NUCLEAR MEDICINE PET SKULL BASE TO THIGH  TECHNIQUE: 9.9 mCi F-18 FDG was injected intravenously. Full-ring PET imaging was performed from the skull base to thigh after the  radiotracer. CT data was obtained and used for attenuation correction and anatomic localization.  FASTING BLOOD GLUCOSE:  Value: 100 mg/dl  COMPARISON:  CT from 09/05/2014.  FINDINGS: NECK  No hypermetabolic lymph nodes in the neck.  CHEST  No hypermetabolic mediastinal or hilar nodes. In the right upper lobe there is a medium size focus of mild to moderate increased uptake within SUV max equal to 3.3. On the corresponding respiratory motion limited CT images there is a 1.8 cm sub solid nodule, image 22/series 6. No additional hypermetabolic pulmonary nodules identified.  ABDOMEN/PELVIS  No abnormal hypermetabolic activity within the liver, pancreas, adrenal glands, or spleen. No significant FDG uptake is associated with the small left adrenal nodule. Left upper quadrant solid nodule is again noted measuring 2.9 cm. SUV max within  this nodule is equal to 2.9 which is similar to activity within the spleen. No hypermetabolic lymph nodes in the abdomen or pelvis. There is increased FDG uptake associated with the sigmoid colon and rectum. Extensive diverticular changes identified within this area. No discrete mass noted.  SKELETON  No focal hypermetabolic activity to suggest skeletal metastasis.  IMPRESSION: 1. There is low level FDG uptake associated with the irregular soft tissue attenuating structure in the left upper quadrant. The activity is slightly above splenic activity. This is a nonspecific finding. If there is a history of trauma to the left upper quadrant it is conceivable that this could represent a benign splenule. Alternatively a malignancy could have a similar appearance including the previously mentioned GIST tumor. Consider further evaluation with endoscopic ultrasound and possible biopsy. 2. Sub solid nodule within the right upper lobe exhibits malignant range FDG uptake. The corresponding CT images are somewhat limited by respiratory motion artifact. Next step in workup would be a diagnostic CT of  the chest and possible biopsy or surgical resection. 3. No significant FDG uptake associated with the small left adrenal nodule. 4. Increased FDG uptake associated with sigmoid colon and rectum. This is a nonspecific finding and likely physiologic. The patient is noted at have extensive diverticular disease within this area.   Electronically Signed   By: Kerby Moors M.D.   On: 10/06/2014 08:57    ASSESSMENT: This is a very pleasant 74 years old white male with persistent abnormal soft tissue mass in the left upper quadrant of the abdomen and questionable groundglass opacities in the lungs that completely resolved on the recent scan. The etiology of the soft tissue mass in the abdomen is unclear and again it could represent a benign finding like splenule but GIST is a still a consideration.  PLAN: I had a lengthy discussion with the patient and his wife today about his current condition. I reviewed the imaging studies with the patient and his wife and showed them the questionable abnormalities. I strongly recommend for the patient to see his gastroenterologist Dr. [name] for evaluation and consideration of upper endoscopy and biopsy of the left upper quadrant soft tissue mass lesion. I would see him back for follow-up visit in one month's for reevaluation and discussion of his treatment options based on the final pathology from the biopsy. The patient is currently asymptomatic. He was advised to call immediately if he has any concerning symptoms in the interval.  The patient voices understanding of current disease status and treatment options and is in agreement with the current care plan.  All questions were answered. The patient knows to call the clinic with any problems, questions or concerns. We can certainly see the patient much sooner if necessary.  Thank you so much for allowing me to participate in the care of [name]. I will continue to follow up the patient with you and assist in  his care.  I spent 40 minutes counseling the patient face to face. The total time spent in the appointment was 60 minutes.  Disclaimer: This note was dictated with voice recognition software. Similar sounding words can inadvertently be transcribed and may not be corrected upon review.   Amit Leece K. 11/02/2014, 12:35 PM

## 2014-11-02 NOTE — Progress Notes (Signed)
Checked in new patient with no issues prior to seeing the dr. He has appt card and has not traveled. He had medicare/bcbs

## 2014-11-02 NOTE — Telephone Encounter (Signed)
Pt confirmed labs/ov per 01/05 POF, gave pt AVS..... KJ

## 2014-11-05 ENCOUNTER — Telehealth: Payer: Self-pay | Admitting: Medical Oncology

## 2014-11-05 DIAGNOSIS — M25519 Pain in unspecified shoulder: Secondary | ICD-10-CM | POA: Diagnosis not present

## 2014-11-05 NOTE — Telephone Encounter (Signed)
Pt asking about his "biopsy appointment".Pt called back and said he has an appointment Jan 12 at 1115 with dr [name].[name]

## 2014-11-09 DIAGNOSIS — R933 Abnormal findings on diagnostic imaging of other parts of digestive tract: Secondary | ICD-10-CM | POA: Diagnosis not present

## 2014-11-09 DIAGNOSIS — D490 Neoplasm of unspecified behavior of digestive system: Secondary | ICD-10-CM | POA: Diagnosis not present

## 2014-11-10 ENCOUNTER — Encounter (HOSPITAL_COMMUNITY): Payer: Self-pay | Admitting: *Deleted

## 2014-11-15 DIAGNOSIS — K648 Other hemorrhoids: Secondary | ICD-10-CM | POA: Diagnosis not present

## 2014-11-15 DIAGNOSIS — K319 Disease of stomach and duodenum, unspecified: Secondary | ICD-10-CM | POA: Diagnosis not present

## 2014-11-17 ENCOUNTER — Ambulatory Visit (HOSPITAL_COMMUNITY): Payer: Medicare Other | Admitting: Anesthesiology

## 2014-11-17 ENCOUNTER — Encounter (HOSPITAL_COMMUNITY): Payer: Self-pay

## 2014-11-17 ENCOUNTER — Encounter (HOSPITAL_COMMUNITY)
Admission: RE | Disposition: A | Discharge status: Home / Self Care | Payer: Self-pay | Source: Ambulatory Visit | Attending: Gastroenterology

## 2014-11-17 ENCOUNTER — Ambulatory Visit (HOSPITAL_COMMUNITY)
Admission: RE | Admit: 2014-11-17 | Discharge: 2014-11-17 | Disposition: A | Discharge status: Home / Self Care | Payer: Medicare Other | Source: Ambulatory Visit | Attending: Gastroenterology | Admitting: Gastroenterology

## 2014-11-17 ENCOUNTER — Other Ambulatory Visit: Payer: Self-pay | Admitting: Gastroenterology

## 2014-11-17 DIAGNOSIS — J449 Chronic obstructive pulmonary disease, unspecified: Secondary | ICD-10-CM | POA: Insufficient documentation

## 2014-11-17 DIAGNOSIS — K3189 Other diseases of stomach and duodenum: Secondary | ICD-10-CM | POA: Insufficient documentation

## 2014-11-17 DIAGNOSIS — R6889 Other general symptoms and signs: Secondary | ICD-10-CM | POA: Diagnosis present

## 2014-11-17 DIAGNOSIS — M1990 Unspecified osteoarthritis, unspecified site: Secondary | ICD-10-CM | POA: Insufficient documentation

## 2014-11-17 DIAGNOSIS — D490 Neoplasm of unspecified behavior of digestive system: Secondary | ICD-10-CM | POA: Diagnosis not present

## 2014-11-17 DIAGNOSIS — Z79899 Other long term (current) drug therapy: Secondary | ICD-10-CM | POA: Insufficient documentation

## 2014-11-17 DIAGNOSIS — Z7982 Long term (current) use of aspirin: Secondary | ICD-10-CM | POA: Diagnosis not present

## 2014-11-17 DIAGNOSIS — R933 Abnormal findings on diagnostic imaging of other parts of digestive tract: Secondary | ICD-10-CM | POA: Diagnosis not present

## 2014-11-17 DIAGNOSIS — E785 Hyperlipidemia, unspecified: Secondary | ICD-10-CM | POA: Insufficient documentation

## 2014-11-17 DIAGNOSIS — Z87891 Personal history of nicotine dependence: Secondary | ICD-10-CM | POA: Diagnosis not present

## 2014-11-17 DIAGNOSIS — K921 Melena: Secondary | ICD-10-CM | POA: Diagnosis not present

## 2014-11-17 HISTORY — PX: FINE NEEDLE ASPIRATION: SHX5430

## 2014-11-17 HISTORY — DX: Personal history of urinary calculi: Z87.442

## 2014-11-17 HISTORY — DX: Myoneural disorder, unspecified: G70.9

## 2014-11-17 HISTORY — DX: Cardiac murmur, unspecified: R01.1

## 2014-11-17 HISTORY — PX: EUS: SHX5427

## 2014-11-17 HISTORY — DX: Cardiac arrhythmia, unspecified: I49.9

## 2014-11-17 SURGERY — ESOPHAGEAL ENDOSCOPIC ULTRASOUND (EUS) RADIAL
Anesthesia: Monitor Anesthesia Care

## 2014-11-17 MED ORDER — ONDANSETRON HCL 4 MG/2ML IJ SOLN
INTRAMUSCULAR | Status: DC | PRN
  Administered 2014-11-17: 4 mg via INTRAVENOUS

## 2014-11-17 MED ORDER — PROPOFOL INFUSION 10 MG/ML OPTIME
INTRAVENOUS | Status: DC | PRN
  Administered 2014-11-17: 150 ug/kg/min via INTRAVENOUS

## 2014-11-17 MED ORDER — LIDOCAINE HCL (CARDIAC) 20 MG/ML IV SOLN
INTRAVENOUS | Status: DC | PRN
  Administered 2014-11-17: 100 mg via INTRAVENOUS

## 2014-11-17 MED ORDER — LIDOCAINE HCL (CARDIAC) 20 MG/ML IV SOLN
INTRAVENOUS | Status: AC
  Filled 2014-11-17: qty 5

## 2014-11-17 MED ORDER — BUTAMBEN-TETRACAINE-BENZOCAINE 2-2-14 % EX AERO
INHALATION_SPRAY | CUTANEOUS | Status: DC | PRN
  Administered 2014-11-17: 1 via TOPICAL

## 2014-11-17 MED ORDER — PROPOFOL 10 MG/ML IV BOLUS
INTRAVENOUS | Status: AC
  Filled 2014-11-17: qty 20

## 2014-11-17 MED ORDER — LACTATED RINGERS IV SOLN
INTRAVENOUS | Status: DC | PRN
  Administered 2014-11-17: 09:00:00 via INTRAVENOUS

## 2014-11-17 MED ORDER — KETAMINE HCL 10 MG/ML IJ SOLN
INTRAMUSCULAR | Status: DC | PRN
  Administered 2014-11-17: 20 mg via INTRAVENOUS

## 2014-11-17 MED ORDER — ONDANSETRON HCL 4 MG/2ML IJ SOLN
INTRAMUSCULAR | Status: AC
  Filled 2014-11-17: qty 2

## 2014-11-17 NOTE — Anesthesia Postprocedure Evaluation (Signed)
  Anesthesia Post-op Note  Patient: [name]  Procedure(s) Performed: Procedure(s) (LRB): ESOPHAGEAL ENDOSCOPIC ULTRASOUND (EUS) RADIAL (N/A) FINE NEEDLE ASPIRATION (FNA) LINEAR (N/A)  Patient Location: PACU  Anesthesia Type: MAC  Level of Consciousness: awake and alert   Airway and Oxygen Therapy: Patient Spontanous Breathing  Post-op Pain: mild  Post-op Assessment: Post-op Vital signs reviewed, Patient's Cardiovascular Status Stable, Respiratory Function Stable, Patent Airway and No signs of Nausea or vomiting  Last Vitals:  Filed Vitals:   11/17/14 0950  BP: 137/93  Pulse: 84  Temp:   Resp: 16    Post-op Vital Signs: stable   Complications: No apparent anesthesia complications

## 2014-11-17 NOTE — Transfer of Care (Signed)
Immediate Anesthesia Transfer of Care Note  Patient: [name]  Procedure(s) Performed: Procedure(s) with comments: ESOPHAGEAL ENDOSCOPIC ULTRASOUND (EUS) RADIAL (N/A) FINE NEEDLE ASPIRATION (FNA) LINEAR (N/A) - + or- fna  Patient Location: Endoscopy Unit  Anesthesia Type:MAC  Level of Consciousness: awake, alert  and oriented  Airway & Oxygen Therapy: Patient Spontanous Breathing and Patient connected to nasal cannula oxygen  Post-op Assessment: Report given to PACU RN  Post vital signs: Reviewed and stable  Complications: No apparent anesthesia complications

## 2014-11-17 NOTE — H&P (View-Only) (Signed)
[place] [phone]   [phone]  CONSULT NOTE  REFERRING PHYSICIAN: Dr. Hulan Fess  REASON FOR CONSULTATION:  74 years old white male with lung and abdominal mass.  HPI [name] is a 74 y.o. male with past medical history significant for osteoarthritis, testicular atrophy, erectile dysfunction, diverticulosis, hernia repair as well as cardiac ablation. The patient mentions that he was in [place] for a vacation in March 2015 when he started having abdominal pain and was diagnosed at that time was abdominal abscesses that was treated with a course of IV antibiotics. CT of the abdomen at that time showed abdominal mass. He was advised to check with his primary care physician for repeat CT scan of the abdomen and pelvis after returning to [facility]. CT scan of the abdomen and pelvis performed on 02/02/2014 showed multiple diverticula throughout the descending and rectosigmoid colon with no evidence of diverticulitis or abscess. There was suspected patchy right lower lobe pneumonia. On 09/05/2014 the patient presented to the emergency Department at [facility] complaining of acute onset abdominal pain. He was diagnosed with acute appendicitis. The patient underwent laparoscopic appendectomy under the care of Dr. Ninfa Linden. The final pathology showed acute suppurative appendicitis.  CT of the abdomen and pelvis at that time showed the findings compatible with acute appendicitis without surrounding fluid collection or free air. There was left adrenal nodule increased in size. There was also an irregular soft tissue mass high in the left upper quadrant abutting the left hemidiaphragm and near the fundus of the stomach. 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 A PET scan was recommended and this was performed on 10/06/2014. It showed low level FDG uptake associated with the  irregular soft tissue attenuating structure in the left upper quadrant. The activity to slightly above splenic activity and this is a nonspecific finding and could represent a benign splenule or a malignancy like GIST tumor. There was also sub-solid nodule within the right upper lobe exhibit malignant change FDG uptake. No significant FDG uptake associated with a small left adrenal nodule. The patient was referred to Dr. Melvyn Novas for evaluation of the pulmonary nodule and repeat CT scan of the chest on 10/27/2014 showed a stable appearance of the soft tissue attenuating mass within the left upper quadrant of the abdomen adjacent to the left hemidiaphragm. There was no acute findings within the chest. The patient was referred to me today for evaluation and recommendation regarding these abnormality on his scan. When seen today he has no complaints he is feeling fine. He denied having any significant chest pain, shortness breath, cough or hemoptysis. The patient denied having any significant weight loss or night sweats. He denied having any headache or visual changes. His family history significant for mother with a stroke and father had pancreatic cancer at age 71. The patient is married and has 3 daughters. He was accompanied today by his wife [name]. 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 He has a history of smoking but quit 12 years ago. He drinks alcohol occasionally and no history of drug abuse. HPI  Past Medical History  Diagnosis Date  . Osteoarthritis   . Tendinitis     achiles heel spur  . Testicular atrophy   . ED (erectile dysfunction)   . History of colon polyps   . Diverticulosis     extensive  . Palpitation  with svt rate 150  . Hyperlipidemia     Past Surgical History  Procedure Laterality Date  . Laparoscopic appendectomy N/A 09/05/2014    Procedure: APPENDECTOMY LAPAROSCOPIC;  Surgeon: [name], MD;  Location: [facility] OR;  Service: General;  Laterality: N/A;     Family History  Problem Relation Age of Onset  . Pancreatic cancer Father   . Aneurysm Brother     Social History History  Substance Use Topics  . Smoking status: Former Smoker -- 1.00 packs/day for 47 years    Types: Cigarettes    Quit date: 10/29/2000  . Smokeless tobacco: Former Systems developer  . Alcohol Use: 0.0 oz/week    0 Not specified per week     Comment: socially    No Known Allergies  Current Outpatient Prescriptions  Medication Sig Dispense Refill  . Ascorbic Acid (VITAMIN C) 1000 MG tablet Take 1,000 mg by mouth daily.    Marland Kitchen aspirin 81 MG EC tablet Take 81 mg by mouth daily.      Marland Kitchen atorvastatin (LIPITOR) 10 MG tablet Take 10 mg by mouth daily.     Marland Kitchen b complex vitamins tablet Take 1 tablet by mouth daily.    . Calcium Carbonate-Vitamin D (CALCIUM 600+D) 600-400 MG-UNIT per tablet Take 1 tablet by mouth daily.      . finasteride (PROSCAR) 5 MG tablet Take 5 mg by mouth daily.   2  . gabapentin (NEURONTIN) 400 MG capsule Take 600 mg by mouth 3 (three) times daily.     Marland Kitchen GLUCOSAMINE-CHONDROITIN-VIT C PO Take 1 tablet by mouth daily.      . Multiple Vitamin (MULTIVITAMIN) tablet Take 1 tablet by mouth daily.      . psyllium (METAMUCIL SMOOTH TEXTURE) 28 % packet Take 1 packet by mouth daily.     Marland Kitchen RAPAFLO 8 MG CAPS capsule Take 8 mg by mouth daily with breakfast.      No current facility-administered medications for this visit.    Review of Systems  Constitutional: negative Eyes: negative Ears, nose, mouth, throat, and face: negative Respiratory: negative Cardiovascular: negative Gastrointestinal: negative Genitourinary:negative Integument/breast: negative Hematologic/lymphatic: negative Musculoskeletal:negative Neurological: negative Behavioral/Psych: negative Endocrine: negative Allergic/Immunologic: negative  Physical Exam  DVV:OHYWV, healthy, no distress, well nourished and well developed SKIN: skin color, texture, turgor are normal, no rashes or  significant lesions HEAD: Normocephalic, No masses, lesions, tenderness or abnormalities EYES: normal, PERRLA EARS: External ears normal, Canals clear OROPHARYNX:no exudate, no erythema and lips, buccal mucosa, and tongue normal  NECK: supple, no adenopathy, no JVD LYMPH:  no palpable lymphadenopathy, no hepatosplenomegaly LUNGS: clear to auscultation , and palpation HEART: regular rate & rhythm and no murmurs ABDOMEN:abdomen soft, non-tender, normal bowel sounds and no masses or organomegaly BACK: Back symmetric, no curvature., No CVA tenderness EXTREMITIES:no joint deformities, effusion, or inflammation, no edema, no skin discoloration  NEURO: alert & oriented x 3 with fluent speech, no focal motor/sensory deficits  PERFORMANCE STATUS: ECOG 0  LABORATORY DATA: Lab Results  Component Value Date   WBC 5.0 11/02/2014   HGB 15.0 11/02/2014   HCT 45.6 11/02/2014   MCV 89.1 11/02/2014   PLT 201 11/02/2014      Chemistry      Component Value Date/Time   NA 145 11/02/2014 1104   NA 143 10/26/2014 1521   K 3.6 11/02/2014 1104   K 4.0 10/26/2014 1521   CL 106 10/26/2014 1521   CO2 31* 11/02/2014 1104   CO2 32 10/26/2014 1521  BUN 10.0 11/02/2014 1104   BUN 12 10/26/2014 1521   CREATININE 0.8 11/02/2014 1104   CREATININE 0.8 10/26/2014 1521      Component Value Date/Time   CALCIUM 9.3 11/02/2014 1104   CALCIUM 9.0 10/26/2014 1521   ALKPHOS 79 11/02/2014 1104   ALKPHOS 81 09/05/2014 1154   AST 21 11/02/2014 1104   AST 20 09/05/2014 1154   ALT 24 11/02/2014 1104   ALT 24 09/05/2014 1154   BILITOT 0.85 11/02/2014 1104   BILITOT 0.6 09/05/2014 1154       RADIOGRAPHIC STUDIES: Ct Chest W Contrast  10/27/2014   CLINICAL DATA:  Evaluate mass within left upper quadrant.  EXAM: CT CHEST WITH CONTRAST  TECHNIQUE: Multidetector CT imaging of the chest was performed during intravenous contrast administration.  CONTRAST:  25mL OMNIPAQUE IOHEXOL 300 MG/ML  SOLN  COMPARISON:   02/02/2014.  FINDINGS: Mediastinum: The heart size appears normal. No pericardial effusion identified. The trachea appears patent and appears midline. The esophagus is unremarkable. No mediastinal or hilar adenopathy.  Lungs/Pleura: There is no pleural effusion identified. Mild changes of centrilobular emphysema. Previous sub solid nodule within the right upper lobe that has resolved in the interval compatible with a benign abnormality. Scar like densities are noted in both lower lobes. No suspicious pulmonary nodule or mass identified.  Upper Abdomen: There are numerous fluid attenuation structures within the liver which likely represent cysts. The largest is in the posterior right hepatic lobe measuring 2.1 cm, image 69/series 2. The visualized portions of the gallbladder are unremarkable. The visualized portions of the pancreas and spleen are also normal.  The soft tissue attenuating mass along the undersurface of the left hemidiaphragm within the left upper quadrant is again noted. This measures 3.8 x 3.2 cm, image 44 of series 602. On the previous exam this measured 4.0 x 3.1 cm. No new mass is identified.  Musculoskeletal: Review of the visualized bony structure is unremarkable.  IMPRESSION: 1. No acute findings within the chest. 2. Stable appearance of soft tissue attenuating mass within the left upper quadrant of the abdomen adjacent to the left hemidiaphragm. Eight months of stability is a reassuring feature. Continued followup is recommended to ensure benignity. The next followup exam should be obtained at 12 months. 3. Liver cysts.   Electronically Signed   By: Kerby Moors M.D.   On: 10/27/2014 15:46   Nm Pet Image Initial (pi) Skull Base To Thigh  10/06/2014   CLINICAL DATA:  Initial treatment strategy for abdominal mass.  EXAM: NUCLEAR MEDICINE PET SKULL BASE TO THIGH  TECHNIQUE: 9.9 mCi F-18 FDG was injected intravenously. Full-ring PET imaging was performed from the skull base to thigh after the  radiotracer. CT data was obtained and used for attenuation correction and anatomic localization.  FASTING BLOOD GLUCOSE:  Value: 100 mg/dl  COMPARISON:  CT from 09/05/2014.  FINDINGS: NECK  No hypermetabolic lymph nodes in the neck.  CHEST  No hypermetabolic mediastinal or hilar nodes. In the right upper lobe there is a medium size focus of mild to moderate increased uptake within SUV max equal to 3.3. On the corresponding respiratory motion limited CT images there is a 1.8 cm sub solid nodule, image 22/series 6. No additional hypermetabolic pulmonary nodules identified.  ABDOMEN/PELVIS  No abnormal hypermetabolic activity within the liver, pancreas, adrenal glands, or spleen. No significant FDG uptake is associated with the small left adrenal nodule. Left upper quadrant solid nodule is again noted measuring 2.9 cm. SUV max within  this nodule is equal to 2.9 which is similar to activity within the spleen. No hypermetabolic lymph nodes in the abdomen or pelvis. There is increased FDG uptake associated with the sigmoid colon and rectum. Extensive diverticular changes identified within this area. No discrete mass noted.  SKELETON  No focal hypermetabolic activity to suggest skeletal metastasis.  IMPRESSION: 1. There is low level FDG uptake associated with the irregular soft tissue attenuating structure in the left upper quadrant. The activity is slightly above splenic activity. This is a nonspecific finding. If there is a history of trauma to the left upper quadrant it is conceivable that this could represent a benign splenule. Alternatively a malignancy could have a similar appearance including the previously mentioned GIST tumor. Consider further evaluation with endoscopic ultrasound and possible biopsy. 2. Sub solid nodule within the right upper lobe exhibits malignant range FDG uptake. The corresponding CT images are somewhat limited by respiratory motion artifact. Next step in workup would be a diagnostic CT of  the chest and possible biopsy or surgical resection. 3. No significant FDG uptake associated with the small left adrenal nodule. 4. Increased FDG uptake associated with sigmoid colon and rectum. This is a nonspecific finding and likely physiologic. The patient is noted at have extensive diverticular disease within this area.   Electronically Signed   By: Kerby Moors M.D.   On: 10/06/2014 08:57    ASSESSMENT: This is a very pleasant 75 years old white male with persistent abnormal soft tissue mass in the left upper quadrant of the abdomen and questionable groundglass opacities in the lungs that completely resolved on the recent scan. The etiology of the soft tissue mass in the abdomen is unclear and again it could represent a benign finding like splenule but GIST is a still a consideration.  PLAN: I had a lengthy discussion with the patient and his wife today about his current condition. I reviewed the imaging studies with the patient and his wife and showed them the questionable abnormalities. I strongly recommend for the patient to see his gastroenterologist Dr. [name] for evaluation and consideration of upper endoscopy and biopsy of the left upper quadrant soft tissue mass lesion. I would see him back for follow-up visit in one month's for reevaluation and discussion of his treatment options based on the final pathology from the biopsy. The patient is currently asymptomatic. He was advised to call immediately if he has any concerning symptoms in the interval.  The patient voices understanding of current disease status and treatment options and is in agreement with the current care plan.  All questions were answered. The patient knows to call the clinic with any problems, questions or concerns. We can certainly see the patient much sooner if necessary.  Thank you so much for allowing me to participate in the care of [name]. I will continue to follow up the patient with you and assist in  his care.  I spent 40 minutes counseling the patient face to face. The total time spent in the appointment was 60 minutes.  Disclaimer: This note was dictated with voice recognition software. Similar sounding words can inadvertently be transcribed and may not be corrected upon review.   Jaelan Rasheed K. 11/02/2014, 12:35 PM

## 2014-11-17 NOTE — Interval H&P Note (Signed)
History and Physical Interval Note:  11/17/2014 8:51 AM  [name]  has presented today for surgery, with the diagnosis of abnormal findings on radiology/blood instool  The various methods of treatment have been discussed with the patient and family. After consideration of risks, benefits and other options for treatment, the patient has consented to  Procedure(s) with comments: ESOPHAGEAL ENDOSCOPIC ULTRASOUND (EUS) RADIAL (N/A) FINE NEEDLE ASPIRATION (FNA) LINEAR (N/A) - + or- fna as a surgical intervention .  The patient's history has been reviewed, patient examined, no change in status, stable for surgery.  I have reviewed the patient's chart and labs.  Questions were answered to the patient's satisfaction.     [name] M  Assessment:  1.  Gastric nodule.  Plan:  1.  Upper endoscopic ultrasound with possible fine needle aspiration versus mucosal biopsies. 2.  Risks (bleeding, infection, bowel perforation that could require surgery, sedation-related changes in cardiopulmonary systems), benefits (identification and possible treatment of source of symptoms, exclusion of certain causes of symptoms), and alternatives (watchful waiting, radiographic imaging studies, empiric medical treatment) of upper endoscopy with ultrasound and possible biopsies (EUS +/- biopsies +/- FNA) were explained to patient/family in detail and patient wishes to proceed.

## 2014-11-17 NOTE — Anesthesia Preprocedure Evaluation (Signed)
Anesthesia Evaluation  Patient identified by MRN, date of birth, ID band Patient awake    Reviewed: Allergy & Precautions, NPO status , Patient's Chart, lab work & pertinent test results  Airway Mallampati: II  TM Distance: >3 FB Neck ROM: Full    Dental no notable dental hx.    Pulmonary COPDformer smoker,  breath sounds clear to auscultation  Pulmonary exam normal       Cardiovascular Exercise Tolerance: Good + dysrhythmias Supra Ventricular Tachycardia + Valvular Problems/Murmurs Rhythm:Regular Rate:Normal     Neuro/Psych  Neuromuscular disease negative psych ROS   GI/Hepatic negative GI ROS, Neg liver ROS,   Endo/Other  negative endocrine ROS  Renal/GU negative Renal ROS  negative genitourinary   Musculoskeletal  (+) Arthritis -,   Abdominal   Peds negative pediatric ROS (+)  Hematology negative hematology ROS (+)   Anesthesia Other Findings   Reproductive/Obstetrics negative OB ROS                             Anesthesia Physical Anesthesia Plan  ASA: III  Anesthesia Plan: MAC   Post-op Pain Management:    Induction: Intravenous  Airway Management Planned:   Additional Equipment:   Intra-op Plan:   Post-operative Plan:   Informed Consent: I have reviewed the patients History and Physical, chart, labs and discussed the procedure including the risks, benefits and alternatives for the proposed anesthesia with the patient or authorized representative who has indicated his/her understanding and acceptance.   Dental advisory given  Plan Discussed with: CRNA  Anesthesia Plan Comments:         Anesthesia Quick Evaluation

## 2014-11-17 NOTE — Op Note (Signed)
[place], [id number]   ENDOSCOPIC ULTRASOUND PROCEDURE REPORT  PATIENT: [name], [name]  MR#: [id number] BIRTHDATE: [birth year]  GENDER: male ENDOSCOPIST: [name], MD REFERRED BY:  Wilford Corner, M.D. PROCEDURE DATE:  11/17/2014 PROCEDURE:   Upper EUS ASA CLASS:      Class III INDICATIONS:   1.  Left upper quadrant lesion on CT scan. MEDICATIONS: Monitored anesthesia care  DESCRIPTION OF PROCEDURE:   After the risks benefits and alternatives of the procedure were  explained, informed consent was obtained. The patient was then placed in the left, lateral, decubitus postion and IV sedation was administered. Throughout the procedure, the patients blood pressure, pulse and oxygen saturations were monitored continuously.  Under direct visualization, the diagnostic forward-viewing endoscope and forward-viewing radial  echoendoscopes were introduced through the mouth  and advanced to the second portion of the duodenum .  Water was used as necessary to provide an acoustic interface.  Upon completion of the imaging, water was removed and the patient was sent to the recovery room in satisfactory condition.    FINDINGS:      EGD:  Normal to second portion of duodenum.  No intramural lesion seen within the stomach, no evidence of extrinsic compression.   EUS:  Gastric wall layers normal.  No perigastric lesion was seen.  The region of the left upper quadrant, especially the subdiaphragmatic space (which is in close contiguity to the diaphragm and the base of the heart, can be difficult to visualize via EUS.  The spleen was identified and the soft tissues in this general region were a bit confluent; no focal lesion was identified despite extensive evaluation.  Pancreas appeared grossly normal. No pancreatic or biliary ductal dilatation was seen.  IMPRESSION:     As above.  Unable to visualize left upper quadrant lesion.  Unclear if this  is due to anatomic constraints from endoscopic ultrasound in this region (see above) or whether the lesion may be contiguous with the spleen and therefore the density of the lesion would be hard to distinguish from routine splenic tissue.  RECOMMENDATIONS:     1.  Watch for potential complications of procedure. 2.  Patient will need colonoscopy for further investigation into his hematochezia; Dr. [name], patient's primary gastroenterologist, can coordinate this. 3.  Options for further investigation into his left upper quadrant lesion would include radiology assessment for image-guided biopsy versus intraoperative evaluation.   _______________________________ Lorrin MaisArta Silence, MD 11/17/2014 9:50 AM   CC:

## 2014-11-18 ENCOUNTER — Emergency Department (HOSPITAL_COMMUNITY)
Admission: EM | Admit: 2014-11-18 | Discharge: 2014-11-18 | Disposition: A | Discharge status: Home / Self Care | Payer: Medicare Other | Attending: Emergency Medicine | Admitting: Emergency Medicine

## 2014-11-18 ENCOUNTER — Encounter (HOSPITAL_COMMUNITY): Payer: Self-pay | Admitting: Gastroenterology

## 2014-11-18 ENCOUNTER — Emergency Department (HOSPITAL_COMMUNITY): Payer: Medicare Other

## 2014-11-18 DIAGNOSIS — M1990 Unspecified osteoarthritis, unspecified site: Secondary | ICD-10-CM | POA: Diagnosis not present

## 2014-11-18 DIAGNOSIS — Z87442 Personal history of urinary calculi: Secondary | ICD-10-CM | POA: Insufficient documentation

## 2014-11-18 DIAGNOSIS — M47816 Spondylosis without myelopathy or radiculopathy, lumbar region: Secondary | ICD-10-CM | POA: Diagnosis not present

## 2014-11-18 DIAGNOSIS — M545 Low back pain, unspecified: Secondary | ICD-10-CM

## 2014-11-18 DIAGNOSIS — M4316 Spondylolisthesis, lumbar region: Secondary | ICD-10-CM | POA: Diagnosis not present

## 2014-11-18 DIAGNOSIS — Z79899 Other long term (current) drug therapy: Secondary | ICD-10-CM | POA: Diagnosis not present

## 2014-11-18 DIAGNOSIS — Z8719 Personal history of other diseases of the digestive system: Secondary | ICD-10-CM | POA: Diagnosis not present

## 2014-11-18 DIAGNOSIS — R011 Cardiac murmur, unspecified: Secondary | ICD-10-CM | POA: Insufficient documentation

## 2014-11-18 DIAGNOSIS — Z7982 Long term (current) use of aspirin: Secondary | ICD-10-CM | POA: Diagnosis not present

## 2014-11-18 DIAGNOSIS — Z86010 Personal history of colonic polyps: Secondary | ICD-10-CM | POA: Diagnosis not present

## 2014-11-18 DIAGNOSIS — Z87891 Personal history of nicotine dependence: Secondary | ICD-10-CM | POA: Diagnosis not present

## 2014-11-18 DIAGNOSIS — E785 Hyperlipidemia, unspecified: Secondary | ICD-10-CM | POA: Diagnosis not present

## 2014-11-18 DIAGNOSIS — Z87438 Personal history of other diseases of male genital organs: Secondary | ICD-10-CM | POA: Diagnosis not present

## 2014-11-18 DIAGNOSIS — Z8669 Personal history of other diseases of the nervous system and sense organs: Secondary | ICD-10-CM | POA: Diagnosis not present

## 2014-11-18 DIAGNOSIS — Z8679 Personal history of other diseases of the circulatory system: Secondary | ICD-10-CM | POA: Insufficient documentation

## 2014-11-18 MED ORDER — OXYCODONE-ACETAMINOPHEN 5-325 MG PO TABS: 1.0000 | ORAL_TABLET | ORAL | Status: DC | PRN

## 2014-11-18 MED ORDER — OXYCODONE-ACETAMINOPHEN 5-325 MG PO TABS
1.0000 | ORAL_TABLET | Freq: Once | ORAL | Status: AC
  Administered 2014-11-18: 1 via ORAL
  Filled 2014-11-18: qty 1

## 2014-11-18 MED ORDER — METHOCARBAMOL 750 MG PO TABS: 750.0000 mg | ORAL_TABLET | Freq: Four times a day (QID) | ORAL | Status: DC | PRN

## 2014-11-18 MED ORDER — IBUPROFEN 600 MG PO TABS: 600.0000 mg | ORAL_TABLET | Freq: Three times a day (TID) | ORAL | Status: DC | PRN

## 2014-11-18 NOTE — ED Notes (Signed)
MD at bedside. 

## 2014-11-18 NOTE — Discharge Instructions (Signed)
Read the information below.  Use the prescribed medication as directed.  Please discuss all new medications with your pharmacist.  Do not take additional tylenol while taking the prescribed pain medication to avoid overdose.  You may return to the Emergency Department at any time for worsening condition or any new symptoms that concern you.   If you develop fevers, loss of control of bowel or bladder, weakness or numbness in your legs, or are unable to walk, return to the ER for a recheck.    Back Pain, Adult Low back pain is very common. About 1 in 5 people have back pain.The cause of low back pain is rarely dangerous. The pain often gets better over time.About half of people with a sudden onset of back pain feel better in just 2 weeks. About 8 in 10 people feel better by 6 weeks.  CAUSES Some common causes of back pain include:  Strain of the muscles or ligaments supporting the spine.  Wear and tear (degeneration) of the spinal discs.  Arthritis.  Direct injury to the back. DIAGNOSIS Most of the time, the direct cause of low back pain is not known.However, back pain can be treated effectively even when the exact cause of the pain is unknown.Answering your caregiver's questions about your overall health and symptoms is one of the most accurate ways to make sure the cause of your pain is not dangerous. If your caregiver needs more information, he or she may order lab work or imaging tests (X-rays or MRIs).However, even if imaging tests show changes in your back, this usually does not require surgery. HOME CARE INSTRUCTIONS For many people, back pain returns.Since low back pain is rarely dangerous, it is often a condition that people can learn to [facility] their own.   Remain active. It is stressful on the back to sit or stand in one place. Do not sit, drive, or stand in one place for more than 30 minutes at a time. Take short walks on level surfaces as soon as pain allows.Try to increase  the length of time you walk each day.  Do not stay in bed.Resting more than 1 or 2 days can delay your recovery.  Do not avoid exercise or work.Your body is made to move.It is not dangerous to be active, even though your back may hurt.Your back will likely heal faster if you return to being active before your pain is gone.  Pay attention to your body when you bend and lift. Many people have less discomfortwhen lifting if they bend their knees, keep the load close to their bodies,and avoid twisting. Often, the most comfortable positions are those that put less stress on your recovering back.  Find a comfortable position to sleep. Use a firm mattress and lie on your side with your knees slightly bent. If you lie on your back, put a pillow under your knees.  Only take over-the-counter or prescription medicines as directed by your caregiver. Over-the-counter medicines to reduce pain and inflammation are often the most helpful.Your caregiver may prescribe muscle relaxant drugs.These medicines help dull your pain so you can more quickly return to your normal activities and healthy exercise.  Put ice on the injured area.  Put ice in a plastic bag.  Place a towel between your skin and the bag.  Leave the ice on for 15-20 minutes, 03-04 times a day for the first 2 to 3 days. After that, ice and heat may be alternated to reduce pain and spasms.  Ask your caregiver about trying back exercises and gentle massage. This may be of some benefit.  Avoid feeling anxious or stressed.Stress increases muscle tension and can worsen back pain.It is important to recognize when you are anxious or stressed and learn ways to manage it.Exercise is a great option. SEEK MEDICAL CARE IF:  You have pain that is not relieved with rest or medicine.  You have pain that does not improve in 1 week.  You have new symptoms.  You are generally not feeling well. SEEK IMMEDIATE MEDICAL CARE IF:   You have pain  that radiates from your back into your legs.  You develop new bowel or bladder control problems.  You have unusual weakness or numbness in your arms or legs.  You develop nausea or vomiting.  You develop abdominal pain.  You feel faint. Document Released: 10/15/2005 Document Revised: 04/15/2012 Document Reviewed: 02/16/2014 [facility] Patient Information 2015 [place], [place]. This information is not intended to replace advice given to you by your health care provider. Make sure you discuss any questions you have with your health care provider.  Back Exercises Back exercises help treat and prevent back injuries. The goal of back exercises is to increase the strength of your abdominal and back muscles and the flexibility of your back. These exercises should be started when you no longer have back pain. Back exercises include:  Pelvic Tilt. Lie on your back with your knees bent. Tilt your pelvis until the lower part of your back is against the floor. Hold this position 5 to 10 sec and repeat 5 to 10 times.  Knee to Chest. Pull first 1 knee up against your chest and hold for 20 to 30 seconds, repeat this with the other knee, and then both knees. This may be done with the other leg straight or bent, whichever feels better.  Sit-Ups or Curl-Ups. Bend your knees 90 degrees. Start with tilting your pelvis, and do a partial, slow sit-up, lifting your trunk only 30 to 45 degrees off the floor. Take at least 2 to 3 seconds for each sit-up. Do not do sit-ups with your knees out straight. If partial sit-ups are difficult, simply do the above but with only tightening your abdominal muscles and holding it as directed.  Hip-Lift. Lie on your back with your knees flexed 90 degrees. Push down with your feet and shoulders as you raise your hips a couple inches off the floor; hold for 10 seconds, repeat 5 to 10 times.  Back arches. Lie on your stomach, propping yourself up on bent elbows. Slowly press on your  hands, causing an arch in your low back. Repeat 3 to 5 times. Any initial stiffness and discomfort should lessen with repetition over time.  Shoulder-Lifts. Lie face down with arms beside your body. Keep hips and torso pressed to floor as you slowly lift your head and shoulders off the floor. Do not overdo your exercises, especially in the beginning. Exercises may cause you some mild back discomfort which lasts for a few minutes; however, if the pain is more severe, or lasts for more than 15 minutes, do not continue exercises until you see your caregiver. Improvement with exercise therapy for back problems is slow.  See your caregivers for assistance with developing a proper back exercise program. Document Released: 11/22/2004 Document Revised: 01/07/2012 Document Reviewed: 08/16/2011 [facility] Patient Information 2015 [place], [place]. This information is not intended to replace advice given to you by your health care provider. Make sure you discuss  any questions you have with your health care provider. ° °

## 2014-11-18 NOTE — ED Provider Notes (Signed)
CSN: [id number]     Arrival date & time 11/18/14  0827 History   First MD Initiated Contact with Patient 11/18/14 [phone]     Chief Complaint  Patient presents with  . Back Pain     (Consider location/radiation/quality/duration/timing/severity/associated sxs/prior Treatment) HPI   Patient presents with left lower back pain that began when he was lying down yesterday for endoscopy.  Pain is described as sharp, located in left lower back, constant since yesterday, radiation down his entire leg.  Pain is exacerbated with movement and with lying flat.  Has tried topical ointment, ibuprofen, tylenol, and an unknown muscle relaxer without relief.  Denies fevers, chills, abdominal pain, loss of control of bowel or bladder, weakness of numbness of the extremities, saddle anesthesia, bowel or urinary complaints.  Specifically denies hematuria, dysuria, urinary frequency or urgency.  Pt has hx kidney stones states this feels nothing like this.  Past Medical History  Diagnosis Date  . Osteoarthritis   . Tendinitis     achiles heel spur  . Testicular atrophy   . ED (erectile dysfunction)   . History of colon polyps   . Diverticulosis     extensive 3'15.  . Palpitation     with svt rate 150  . Hyperlipidemia   . Dysrhythmia     hx. Ablation for tachycardia, no routine cardiology visits now  . Heart murmur     teenager  . History of kidney stones     x1 passed  . Neuromuscular disorder     "neuroma"    Past Surgical History  Procedure Laterality Date  . Laparoscopic appendectomy N/A 09/05/2014    Procedure: APPENDECTOMY LAPAROSCOPIC;  Surgeon: [name], MD;  Location: [place];  Service: General;  Laterality: N/A;  . Hernia repair Bilateral     '05 bilateral hernia  . Knee arthroscopy Left     scope '08  . Hemorrhoid surgery      banding   . Eus N/A 11/17/2014    Procedure: ESOPHAGEAL ENDOSCOPIC ULTRASOUND (EUS) RADIAL;  Surgeon: [name], MD;  Location: [facility];  Service:  Endoscopy;  Laterality: N/A;  . Fine needle aspiration N/A 11/17/2014    Procedure: FINE NEEDLE ASPIRATION (FNA) LINEAR;  Surgeon: [name], MD;  Location: [facility];  Service: Endoscopy;  Laterality: N/A;  + or- fna   Family History  Problem Relation Age of Onset  . Pancreatic cancer Father   . Aneurysm Brother    History  Substance Use Topics  . Smoking status: Former Smoker -- 1.00 packs/day for 47 years    Types: Cigarettes    Quit date: 10/29/2000  . Smokeless tobacco: Former Systems developer  . Alcohol Use: 0.0 oz/week    0 Not specified per week     Comment: socially- nightly 1 coctail    Review of Systems  All other systems reviewed and are negative.     Allergies  Review of patient's allergies indicates no known allergies.  Home Medications   Prior to Admission medications   Medication Sig Start Date End Date Taking? Authorizing Provider  Ascorbic Acid (VITAMIN C) 1000 MG tablet Take 1,000 mg by mouth daily.   Yes Historical Provider, MD  aspirin 81 MG EC tablet Take 81 mg by mouth daily.     Yes Historical Provider, MD  atorvastatin (LIPITOR) 10 MG tablet Take 10 mg by mouth daily.  09/03/14  Yes Historical Provider, MD  b complex vitamins tablet Take 1 tablet by mouth daily.  Yes Historical Provider, MD  Calcium Carbonate-Vitamin D (CALCIUM 600+D) 600-400 MG-UNIT per tablet Take 1 tablet by mouth daily.     Yes Historical Provider, MD  finasteride (PROSCAR) 5 MG tablet Take 5 mg by mouth daily.  08/20/14  Yes Historical Provider, MD  gabapentin (NEURONTIN) 600 MG tablet Take 600 mg by mouth 3 (three) times daily.   Yes Historical Provider, MD  GLUCOSAMINE-CHONDROITIN-VIT C PO Take 1 tablet by mouth daily.     Yes Historical Provider, MD  Multiple Vitamin (MULTIVITAMIN) tablet Take 1 tablet by mouth daily.     Yes Historical Provider, MD  psyllium (METAMUCIL SMOOTH TEXTURE) 28 % packet Take 1 packet by mouth daily.    Yes Historical Provider, MD  RAPAFLO 8 MG CAPS  capsule Take 8 mg by mouth daily with breakfast.  09/05/14  Yes Historical Provider, MD   BP 115/79 mmHg  Pulse 91  Temp(Src) 97.6 F (36.4 C) (Oral)  Resp 22  Ht 6' (1.829 m)  Wt 200 lb (90.719 kg)  BMI 27.12 kg/m2  SpO2 95% Physical Exam  Constitutional: He appears well-developed and well-nourished. No distress.  HENT:  Head: Normocephalic and atraumatic.  Neck: Neck supple.  Cardiovascular: Normal rate and regular rhythm.   Pulmonary/Chest: Effort normal and breath sounds normal. No respiratory distress. He has no wheezes. He has no rales.  Abdominal: Soft. He exhibits no distension and no mass. There is no tenderness. There is no rebound and no guarding.  Musculoskeletal:       Back:  Spine nontender, no crepitus, or stepoffs. Lower extremities:  Strength 5/5, sensation intact, distal pulses intact.     Neurological: He is alert. He exhibits normal muscle tone.  Skin: He is not diaphoretic.  Nursing note and vitals reviewed.   ED Course  Procedures (including critical care time) Labs Review Labs Reviewed - No data to display  Imaging Review Dg Lumbar Spine Complete  11/18/2014   CLINICAL DATA:  Low back pain radiating into the left like  EXAM: LUMBAR SPINE - COMPLETE 4+ VIEW  COMPARISON:  09/05/2014  FINDINGS: Five lumbar type vertebral bodies are well visualized. Degenerative changes are noted at L3-4, L4-5 and L5-S1. Grade 1 anterolisthesis of a degenerative nature is noted at L4-5. This has increased slightly in the interval from the prior exam. No other focal abnormality is noted.  IMPRESSION: Degenerative changes.  No definitive acute abnormality is noted.   Electronically Signed   By: Inez Catalina M.D.   On: 11/18/2014 10:51     EKG Interpretation None       Discussed pt with Dr Jeanell Sparrow who will also see patient.   MDM   Final diagnoses:  Left low back pain    Afebrile, nontoxic patient with mechanical low back pain. No red flags.  Lumbar xray significant for  degenerative changes only.  Also seen and examined by Dr Jeanell Sparrow who agrees with workup and plan.  D/C home with NSAID, muscle relaxer, percocet, PCP follow up.  Discussed result, findings, treatment, and follow up  with patient.  Pt given return precautions.  Pt verbalizes understanding and agrees with plan.        Clayton Bibles, PA-C 11/18/14 Douglas Ray, MD 11/19/14 [phone]

## 2014-11-18 NOTE — ED Notes (Signed)
Pt was at [facility] yesterday and states his back into his left hip started to hurt while laying in hospital bed states pain got worse last night. Worse while laying down. Denies any fall or injury. C/o pain in lower back that radiates down left leg.

## 2014-11-22 ENCOUNTER — Encounter (HOSPITAL_COMMUNITY): Payer: Self-pay | Admitting: Emergency Medicine

## 2014-11-22 ENCOUNTER — Emergency Department (HOSPITAL_COMMUNITY)
Admission: EM | Admit: 2014-11-22 | Discharge: 2014-11-22 | Disposition: A | Discharge status: Home / Self Care | Payer: Medicare Other | Attending: Emergency Medicine | Admitting: Emergency Medicine

## 2014-11-22 DIAGNOSIS — Z8719 Personal history of other diseases of the digestive system: Secondary | ICD-10-CM | POA: Diagnosis not present

## 2014-11-22 DIAGNOSIS — M62830 Muscle spasm of back: Secondary | ICD-10-CM | POA: Diagnosis not present

## 2014-11-22 DIAGNOSIS — R011 Cardiac murmur, unspecified: Secondary | ICD-10-CM | POA: Insufficient documentation

## 2014-11-22 DIAGNOSIS — M1990 Unspecified osteoarthritis, unspecified site: Secondary | ICD-10-CM | POA: Insufficient documentation

## 2014-11-22 DIAGNOSIS — Z87438 Personal history of other diseases of male genital organs: Secondary | ICD-10-CM | POA: Diagnosis not present

## 2014-11-22 DIAGNOSIS — Z8679 Personal history of other diseases of the circulatory system: Secondary | ICD-10-CM | POA: Diagnosis not present

## 2014-11-22 DIAGNOSIS — Z79899 Other long term (current) drug therapy: Secondary | ICD-10-CM | POA: Insufficient documentation

## 2014-11-22 DIAGNOSIS — Z7982 Long term (current) use of aspirin: Secondary | ICD-10-CM | POA: Insufficient documentation

## 2014-11-22 DIAGNOSIS — Z8669 Personal history of other diseases of the nervous system and sense organs: Secondary | ICD-10-CM | POA: Insufficient documentation

## 2014-11-22 DIAGNOSIS — E785 Hyperlipidemia, unspecified: Secondary | ICD-10-CM | POA: Diagnosis not present

## 2014-11-22 DIAGNOSIS — Z87442 Personal history of urinary calculi: Secondary | ICD-10-CM | POA: Diagnosis not present

## 2014-11-22 DIAGNOSIS — Z87891 Personal history of nicotine dependence: Secondary | ICD-10-CM | POA: Insufficient documentation

## 2014-11-22 DIAGNOSIS — Z86010 Personal history of colonic polyps: Secondary | ICD-10-CM | POA: Insufficient documentation

## 2014-11-22 DIAGNOSIS — M5442 Lumbago with sciatica, left side: Secondary | ICD-10-CM | POA: Diagnosis not present

## 2014-11-22 DIAGNOSIS — M545 Low back pain: Secondary | ICD-10-CM | POA: Diagnosis present

## 2014-11-22 MED ORDER — CYCLOBENZAPRINE HCL 10 MG PO TABS
5.0000 mg | ORAL_TABLET | Freq: Once | ORAL | Status: AC
  Administered 2014-11-22: 5 mg via ORAL
  Filled 2014-11-22: qty 1

## 2014-11-22 MED ORDER — CYCLOBENZAPRINE HCL 5 MG PO TABS: 5.0000 mg | ORAL_TABLET | Freq: Once | ORAL | Status: DC

## 2014-11-22 MED ORDER — HYDROMORPHONE HCL 1 MG/ML IJ SOLN
1.0000 mg | Freq: Once | INTRAMUSCULAR | Status: AC
  Administered 2014-11-22: 1 mg via INTRAMUSCULAR
  Filled 2014-11-22: qty 1

## 2014-11-22 MED ORDER — OXYCODONE-ACETAMINOPHEN 5-325 MG PO TABS: 1.0000 | ORAL_TABLET | ORAL | Status: DC | PRN

## 2014-11-22 NOTE — ED Notes (Signed)
Pt c/o left sided back and leg pain; pt seen here for same; pt sts some swelling in bilateral lower extremity

## 2014-11-22 NOTE — ED Provider Notes (Addendum)
CSN: [id number]     Arrival date & time 11/22/14  1416 History   First MD Initiated Contact with Patient 11/22/14 1750     Chief Complaint  Patient presents with  . Back Pain  . Leg Pain     (Consider location/radiation/quality/duration/timing/severity/associated sxs/prior Treatment) Patient is a 74 y.o. male presenting with back pain.  Back Pain Location:  Gluteal region Quality:  Shooting Radiates to:  L knee Pain severity:  Severe Onset quality:  Sudden Duration:  5 days Timing:  Constant Progression:  Worsening Chronicity:  New Context comment:  Began when he hurt his back while trying to get onto a stretcher for a GI procedure. Relieved by:  Nothing (bending forward) Worsened by:  Movement, standing, touching and twisting Associated symptoms: leg pain   Associated symptoms: no bladder incontinence, no bowel incontinence, no chest pain, no dysuria, no perianal numbness, no tingling and no weakness     Past Medical History  Diagnosis Date  . Osteoarthritis   . Tendinitis     achiles heel spur  . Testicular atrophy   . ED (erectile dysfunction)   . History of colon polyps   . Diverticulosis     extensive 3'15.  . Palpitation     with svt rate 150  . Hyperlipidemia   . Dysrhythmia     hx. Ablation for tachycardia, no routine cardiology visits now  . Heart murmur     teenager  . History of kidney stones     x1 passed  . Neuromuscular disorder     "neuroma"    Past Surgical History  Procedure Laterality Date  . Laparoscopic appendectomy N/A 09/05/2014    Procedure: APPENDECTOMY LAPAROSCOPIC;  Surgeon: [name], MD;  Location: [place];  Service: General;  Laterality: N/A;  . Hernia repair Bilateral     '05 bilateral hernia  . Knee arthroscopy Left     scope '08  . Hemorrhoid surgery      banding   . Eus N/A 11/17/2014    Procedure: ESOPHAGEAL ENDOSCOPIC ULTRASOUND (EUS) RADIAL;  Surgeon: [name], MD;  Location: [facility];  Service: Endoscopy;   Laterality: N/A;  . Fine needle aspiration N/A 11/17/2014    Procedure: FINE NEEDLE ASPIRATION (FNA) LINEAR;  Surgeon: [name], MD;  Location: [facility];  Service: Endoscopy;  Laterality: N/A;  + or- fna   Family History  Problem Relation Age of Onset  . Pancreatic cancer Father   . Aneurysm Brother    History  Substance Use Topics  . Smoking status: Former Smoker -- 1.00 packs/day for 47 years    Types: Cigarettes    Quit date: 10/29/2000  . Smokeless tobacco: Former Systems developer  . Alcohol Use: 0.0 oz/week    0 Not specified per week     Comment: socially- nightly 1 coctail    Review of Systems  Respiratory: Negative for shortness of breath.   Cardiovascular: Positive for leg swelling. Negative for chest pain.  Gastrointestinal: Negative for bowel incontinence.  Genitourinary: Negative for bladder incontinence and dysuria.  Musculoskeletal: Positive for back pain.  Neurological: Negative for tingling and weakness.  All other systems reviewed and are negative.     Allergies  Review of patient's allergies indicates no known allergies.  Home Medications   Prior to Admission medications   Medication Sig Start Date End Date Taking? Authorizing Provider  Ascorbic Acid (VITAMIN C) 1000 MG tablet Take 1,000 mg by mouth daily.   Yes Historical Provider, MD  aspirin  81 MG EC tablet Take 81 mg by mouth daily.     Yes Historical Provider, MD  atorvastatin (LIPITOR) 10 MG tablet Take 10 mg by mouth daily.  09/03/14  Yes Historical Provider, MD  b complex vitamins tablet Take 1 tablet by mouth daily.   Yes Historical Provider, MD  Calcium Carbonate-Vitamin D (CALCIUM 600+D) 600-400 MG-UNIT per tablet Take 1 tablet by mouth daily.     Yes Historical Provider, MD  finasteride (PROSCAR) 5 MG tablet Take 5 mg by mouth daily.  08/20/14  Yes Historical Provider, MD  gabapentin (NEURONTIN) 600 MG tablet Take 600 mg by mouth 3 (three) times daily.   Yes Historical Provider, MD    GLUCOSAMINE-CHONDROITIN-VIT C PO Take 1 tablet by mouth daily.     Yes Historical Provider, MD  ibuprofen (ADVIL,MOTRIN) 600 MG tablet Take 1 tablet (600 mg total) by mouth every 8 (eight) hours as needed for mild pain or moderate pain. 11/18/14  Yes Clayton Bibles, PA-C  methocarbamol (ROBAXIN) 750 MG tablet Take 1 tablet (750 mg total) by mouth every 6 (six) hours as needed for muscle spasms (and pain). 11/18/14  Yes Clayton Bibles, PA-C  Multiple Vitamin (MULTIVITAMIN) tablet Take 1 tablet by mouth daily.     Yes Historical Provider, MD  oxyCODONE-acetaminophen (PERCOCET/ROXICET) 5-325 MG per tablet Take 1-2 tablets by mouth every 4 (four) hours as needed for severe pain. 11/18/14  Yes Clayton Bibles, PA-C  psyllium (METAMUCIL SMOOTH TEXTURE) 28 % packet Take 1 packet by mouth daily.    Yes Historical Provider, MD  RAPAFLO 8 MG CAPS capsule Take 8 mg by mouth daily with breakfast.  09/05/14  Yes Historical Provider, MD   BP 135/82 mmHg  Pulse 90  Temp(Src) 97.8 F (36.6 C) (Oral)  Resp 18  SpO2 98% Physical Exam  Constitutional: He is oriented to person, place, and time. He appears well-developed and well-nourished. No distress.  HENT:  Head: Normocephalic and atraumatic.  Eyes: Conjunctivae are normal. No scleral icterus.  Neck: Neck supple.  Cardiovascular: Normal rate and intact distal pulses.   Pulmonary/Chest: Effort normal. No stridor. No respiratory distress.  Abdominal: Normal appearance. He exhibits no distension.  Musculoskeletal: He exhibits edema (1+ BLE).       Lumbar back: He exhibits spasm. He exhibits no bony tenderness and no swelling.       Back:  2+ DP pulses  Neurological: He is alert and oriented to person, place, and time.  Skin: Skin is warm and dry. No rash noted.  Psychiatric: He has a normal mood and affect. His behavior is normal.  Nursing note and vitals reviewed.   ED Course  Procedures (including critical care time) Labs Review Labs Reviewed - No data to  display  Imaging Review No results found.   EKG Interpretation None      MDM   Final diagnoses:  Left-sided low back pain with left-sided sciatica    History and exam are consistent with MSK low back pain, beginning after a minor back injuring while getting onto a stretcher about 5 days ago.  He has tried percocet, robaxin, and ibuprofen without improvement.  Will give dose of IM dilaudid here.  DC with flexeril and percocet.  Advised PCP, ortho, and PT follow up.  No red flag signs or symptoms to suggest spinal pathology.    Of note, pt complained also of bilateral leg swelling.  I believe this is secondary to patient sleeping in a sitting position.  He has  no other symptoms to suggest CHF, cellulitis, or DVTs.    Arbie Cookey, MD 11/22/14 Elias-Fela Solis III, MD 11/22/14 [phone]

## 2014-11-22 NOTE — Discharge Instructions (Signed)
Sciatica Sciatica is pain, weakness, numbness, or tingling along the path of the sciatic nerve. The nerve starts in the lower back and runs down the back of each leg. The nerve controls the muscles in the lower leg and in the back of the knee, while also providing sensation to the back of the thigh, lower leg, and the sole of your foot. Sciatica is a symptom of another medical condition. For instance, nerve damage or certain conditions, such as a herniated disk or bone spur on the spine, pinch or put pressure on the sciatic nerve. This causes the pain, weakness, or other sensations normally associated with sciatica. Generally, sciatica only affects one side of the body. CAUSES   Herniated or slipped disc.  Degenerative disk disease.  A pain disorder involving the narrow muscle in the buttocks (piriformis syndrome).  Pelvic injury or fracture.  Pregnancy.  Tumor (rare). SYMPTOMS  Symptoms can vary from mild to very severe. The symptoms usually travel from the low back to the buttocks and down the back of the leg. Symptoms can include:  Mild tingling or dull aches in the lower back, leg, or hip.  Numbness in the back of the calf or sole of the foot.  Burning sensations in the lower back, leg, or hip.  Sharp pains in the lower back, leg, or hip.  Leg weakness.  Severe back pain inhibiting movement. These symptoms may get worse with coughing, sneezing, laughing, or prolonged sitting or standing. Also, being overweight may worsen symptoms. DIAGNOSIS  Your caregiver will perform a physical exam to look for common symptoms of sciatica. He or she may ask you to do certain movements or activities that would trigger sciatic nerve pain. Other tests may be performed to find the cause of the sciatica. These may include:  Blood tests.  X-rays.  Imaging tests, such as an MRI or CT scan. TREATMENT  Treatment is directed at the cause of the sciatic pain. Sometimes, treatment is not necessary  and the pain and discomfort goes away on its own. If treatment is needed, your caregiver may suggest:  Over-the-counter medicines to relieve pain.  Prescription medicines, such as anti-inflammatory medicine, muscle relaxants, or narcotics.  Applying heat or ice to the painful area.  Steroid injections to lessen pain, irritation, and inflammation around the nerve.  Reducing activity during periods of pain.  Exercising and stretching to strengthen your abdomen and improve flexibility of your spine. Your caregiver may suggest losing weight if the extra weight makes the back pain worse.  Physical therapy.  Surgery to eliminate what is pressing or pinching the nerve, such as a bone spur or part of a herniated disk. HOME CARE INSTRUCTIONS   Only take over-the-counter or prescription medicines for pain or discomfort as directed by your caregiver.  Apply ice to the affected area for 20 minutes, 3-4 times a day for the first 48-72 hours. Then try heat in the same way.  Exercise, stretch, or perform your usual activities if these do not aggravate your pain.  Attend physical therapy sessions as directed by your caregiver.  Keep all follow-up appointments as directed by your caregiver.  Do not wear high heels or shoes that do not provide proper support.  Check your mattress to see if it is too soft. A firm mattress may lessen your pain and discomfort. SEEK IMMEDIATE MEDICAL CARE IF:   You lose control of your bowel or bladder (incontinence).  You have increasing weakness in the lower back, pelvis, buttocks,   or legs.  You have redness or swelling of your back.  You have a burning sensation when you urinate.  You have pain that gets worse when you lie down or awakens you at night.  Your pain is worse than you have experienced in the past.  Your pain is lasting longer than 4 weeks.  You are suddenly losing weight without reason. MAKE SURE YOU:  Understand these  instructions.  Will watch your condition.  Will get help right away if you are not doing well or get worse. Document Released: 10/09/2001 Document Revised: 04/15/2012 Document Reviewed: 02/24/2012 [employer] Patient Information 2015 [employer]. This information is not intended to replace advice given to you by your health care provider. Make sure you discuss any questions you have with your health care provider.  

## 2014-11-26 DIAGNOSIS — M67911 Unspecified disorder of synovium and tendon, right shoulder: Secondary | ICD-10-CM | POA: Diagnosis not present

## 2014-11-26 DIAGNOSIS — M5432 Sciatica, left side: Secondary | ICD-10-CM | POA: Diagnosis not present

## 2014-11-29 ENCOUNTER — Other Ambulatory Visit (HOSPITAL_COMMUNITY): Payer: Self-pay | Admitting: Gastroenterology

## 2014-11-29 DIAGNOSIS — IMO0002 Reserved for concepts with insufficient information to code with codable children: Secondary | ICD-10-CM

## 2014-11-29 DIAGNOSIS — M5442 Lumbago with sciatica, left side: Secondary | ICD-10-CM | POA: Diagnosis not present

## 2014-11-29 DIAGNOSIS — R229 Localized swelling, mass and lump, unspecified: Principal | ICD-10-CM

## 2014-11-30 ENCOUNTER — Telehealth: Payer: Self-pay | Admitting: *Deleted

## 2014-11-30 NOTE — Telephone Encounter (Signed)
Onc tx request sent to r/s appt to after biopsy

## 2014-11-30 NOTE — Telephone Encounter (Signed)
Call in to [facility] from pt inquiring " do I need to have my appointment rescheduled with Dr Julien Nordmann for next Monday 2/8 "  Pt states he is not having his biopsy until 2/9 " and I think he did not want to see me until after the biopsy- it was delayed because of my surgeon Dr Paulita Fujita "  Return call number for pt given as [phone].  THIS NOTE WILL BE SENT TO MD AND RN AT DESK FOR FOLLOW UP AND CONTACT WITH PT.

## 2014-12-03 ENCOUNTER — Other Ambulatory Visit: Payer: Self-pay | Admitting: Radiology

## 2014-12-06 ENCOUNTER — Ambulatory Visit: Payer: Medicare Other | Admitting: Internal Medicine

## 2014-12-06 ENCOUNTER — Other Ambulatory Visit: Payer: Self-pay | Admitting: Radiology

## 2014-12-06 ENCOUNTER — Other Ambulatory Visit: Payer: Medicare Other

## 2014-12-07 ENCOUNTER — Ambulatory Visit (HOSPITAL_COMMUNITY)
Admission: RE | Admit: 2014-12-07 | Discharge: 2014-12-07 | Disposition: A | Discharge status: Home / Self Care | Payer: Medicare Other | Source: Ambulatory Visit | Attending: Gastroenterology | Admitting: Gastroenterology

## 2014-12-07 ENCOUNTER — Encounter (HOSPITAL_COMMUNITY): Payer: Self-pay

## 2014-12-07 DIAGNOSIS — D1803 Hemangioma of intra-abdominal structures: Secondary | ICD-10-CM | POA: Diagnosis not present

## 2014-12-07 DIAGNOSIS — E785 Hyperlipidemia, unspecified: Secondary | ICD-10-CM | POA: Diagnosis not present

## 2014-12-07 DIAGNOSIS — Z7982 Long term (current) use of aspirin: Secondary | ICD-10-CM | POA: Insufficient documentation

## 2014-12-07 DIAGNOSIS — Z800 Family history of malignant neoplasm of digestive organs: Secondary | ICD-10-CM | POA: Diagnosis not present

## 2014-12-07 DIAGNOSIS — D235 Other benign neoplasm of skin of trunk: Secondary | ICD-10-CM | POA: Diagnosis not present

## 2014-12-07 DIAGNOSIS — R229 Localized swelling, mass and lump, unspecified: Secondary | ICD-10-CM

## 2014-12-07 DIAGNOSIS — IMO0002 Reserved for concepts with insufficient information to code with codable children: Secondary | ICD-10-CM

## 2014-12-07 DIAGNOSIS — Z87891 Personal history of nicotine dependence: Secondary | ICD-10-CM | POA: Diagnosis not present

## 2014-12-07 DIAGNOSIS — Z79899 Other long term (current) drug therapy: Secondary | ICD-10-CM | POA: Insufficient documentation

## 2014-12-07 DIAGNOSIS — R1902 Left upper quadrant abdominal swelling, mass and lump: Secondary | ICD-10-CM | POA: Diagnosis present

## 2014-12-07 DIAGNOSIS — R1900 Intra-abdominal and pelvic swelling, mass and lump, unspecified site: Secondary | ICD-10-CM | POA: Diagnosis not present

## 2014-12-07 LAB — CBC
HCT: 45.4 % (ref 39.0–52.0)
Hemoglobin: 15.4 g/dL (ref 13.0–17.0)
MCH: 30 pg (ref 26.0–34.0)
MCHC: 33.9 g/dL (ref 30.0–36.0)
MCV: 88.3 fL (ref 78.0–100.0)
Platelets: 268 10*3/uL (ref 150–400)
RBC: 5.14 MIL/uL (ref 4.22–5.81)
RDW: 13.6 % (ref 11.5–15.5)
WBC: 9.9 10*3/uL (ref 4.0–10.5)

## 2014-12-07 LAB — PROTIME-INR
INR: 1.06 (ref 0.00–1.49)
Prothrombin Time: 13.9 seconds (ref 11.6–15.2)

## 2014-12-07 LAB — APTT: aPTT: 27 seconds (ref 24–37)

## 2014-12-07 MED ORDER — FENTANYL CITRATE 0.05 MG/ML IJ SOLN
INTRAMUSCULAR | Status: AC | PRN
  Administered 2014-12-07: 50 ug via INTRAVENOUS

## 2014-12-07 MED ORDER — FENTANYL CITRATE 0.05 MG/ML IJ SOLN
INTRAMUSCULAR | Status: AC
  Filled 2014-12-07: qty 2

## 2014-12-07 MED ORDER — MIDAZOLAM HCL 2 MG/2ML IJ SOLN
INTRAMUSCULAR | Status: AC
  Filled 2014-12-07: qty 2

## 2014-12-07 MED ORDER — MIDAZOLAM HCL 2 MG/2ML IJ SOLN
INTRAMUSCULAR | Status: AC | PRN
  Administered 2014-12-07: 1 mg via INTRAVENOUS

## 2014-12-07 MED ORDER — LIDOCAINE HCL 1 % IJ SOLN
INTRAMUSCULAR | Status: AC
  Filled 2014-12-07: qty 20

## 2014-12-07 MED ORDER — HYDROCODONE-ACETAMINOPHEN 5-325 MG PO TABS: 1.0000 | ORAL_TABLET | ORAL | Status: DC | PRN

## 2014-12-07 MED ORDER — SODIUM CHLORIDE 0.9 % IV SOLN
Freq: Once | INTRAVENOUS | Status: AC
  Administered 2014-12-07: 10:00:00 via INTRAVENOUS

## 2014-12-07 NOTE — Discharge Instructions (Signed)
Biopsy, Care After Refer to this sheet in the next few weeks. These instructions provide you with information on caring for yourself after your procedure. Your health care provider may also give you more specific instructions. Your treatment has been planned according to current medical practices, but problems sometimes occur. Call your health care provider if you have any problems or questions after your procedure.  WHAT TO EXPECT AFTER THE PROCEDURE   You may feel some pain at the biopsy site for 1-2 weeks after the biopsy. HOME CARE INSTRUCTIONS  Do not lift anything heavier than 10 lb (4.5 kg) for 2 weeks.  Do not take any non-steroidal anti-inflammatory drugs (NSAIDs) or any blood thinners for a week after the biopsy unless instructed to do so by your health care provider.  Only take medicines for pain, fever, or discomfort as directed by your health care provider. SEEK MEDICAL CARE IF:  You develop a fever.   You cannot urinate.   You have increasing pain at the biopsy site.  SEEK IMMEDIATE MEDICAL CARE IF: You feel faint or dizzy.  Document Released: 06/17/2013 Document Reviewed: 06/17/2013 [facility] Patient Information 2015 [employer]. This information is not intended to replace advice given to you by your health care provider. Make sure you discuss any questions you have with your health care provider.

## 2014-12-07 NOTE — Procedures (Signed)
Procedure:  CT guided core biopsy of LUQ abdominal/peritoneal mass Findings:  3.5 cm left subphrenic mass localized.  17 G needle advanced under CT fluoro guidance. 18 G core biopsy x 3 performed.  No complications.

## 2014-12-07 NOTE — Sedation Documentation (Signed)
Patient denies pain and is resting comfortably.  

## 2014-12-07 NOTE — H&P (Signed)
Chief Complaint: "I am here for a biopsy."  Referring Physician(s): Schooler,Vincent C.  History of Present Illness: [name] is a 74 y.o. male who has seen Dr. [name] and is here today for a LUQ mass biopsy that has been seen on CT and has low uptake on PET. He denies any chest pain, shortness of breath or palpitations. He denies any active signs of bleeding or excessive bruising. He denies any recent fever or chills. The patient denies any history of sleep apnea or chronic oxygen use. He has previously tolerated sedation without complications.   Past Medical History  Diagnosis Date  . Osteoarthritis   . Tendinitis     achiles heel spur  . Testicular atrophy   . ED (erectile dysfunction)   . History of colon polyps   . Diverticulosis     extensive 3'15.  . Palpitation     with svt rate 150  . Hyperlipidemia   . Dysrhythmia     hx. Ablation for tachycardia, no routine cardiology visits now  . Heart murmur     teenager  . History of kidney stones     x1 passed  . Neuromuscular disorder     "neuroma"     Past Surgical History  Procedure Laterality Date  . Laparoscopic appendectomy N/A 09/05/2014    Procedure: APPENDECTOMY LAPAROSCOPIC;  Surgeon: [name], MD;  Location: [place];  Service: General;  Laterality: N/A;  . Hernia repair Bilateral     '05 bilateral hernia  . Knee arthroscopy Left     scope '08  . Hemorrhoid surgery      banding   . Eus N/A 11/17/2014    Procedure: ESOPHAGEAL ENDOSCOPIC ULTRASOUND (EUS) RADIAL;  Surgeon: [name], MD;  Location: [facility];  Service: Endoscopy;  Laterality: N/A;  . Fine needle aspiration N/A 11/17/2014    Procedure: FINE NEEDLE ASPIRATION (FNA) LINEAR;  Surgeon: [name], MD;  Location: [facility];  Service: Endoscopy;  Laterality: N/A;  + or- fna    Allergies: Review of patient's allergies indicates no known allergies.  Medications: Prior to Admission medications   Medication Sig Start  Date End Date Taking? Authorizing Provider  Ascorbic Acid (VITAMIN C) 1000 MG tablet Take 1,000 mg by mouth daily.   Yes Historical Provider, MD  aspirin 81 MG EC tablet Take 81 mg by mouth daily.     Yes Historical Provider, MD  atorvastatin (LIPITOR) 10 MG tablet Take 10 mg by mouth daily.  09/03/14  Yes Historical Provider, MD  b complex vitamins tablet Take 1 tablet by mouth daily.   Yes Historical Provider, MD  Calcium Carbonate-Vitamin D (CALCIUM 600+D) 600-400 MG-UNIT per tablet Take 1 tablet by mouth daily.     Yes Historical Provider, MD  cyclobenzaprine (FLEXERIL) 5 MG tablet Take 1 tablet (5 mg total) by mouth once. 11/22/14  Yes Houston Siren III, MD  finasteride (PROSCAR) 5 MG tablet Take 5 mg by mouth daily.  08/20/14  Yes Historical Provider, MD  gabapentin (NEURONTIN) 600 MG tablet Take 600 mg by mouth 3 (three) times daily.   Yes Historical Provider, MD  GLUCOSAMINE-CHONDROITIN-VIT C PO Take 1 tablet by mouth daily.     Yes Historical Provider, MD  ibuprofen (ADVIL,MOTRIN) 600 MG tablet Take 1 tablet (600 mg total) by mouth every 8 (eight) hours as needed for mild pain or moderate pain. 11/18/14  Yes Clayton Bibles, PA-C  methocarbamol (ROBAXIN) 750 MG tablet Take 1 tablet (750 mg total)  by mouth every 6 (six) hours as needed for muscle spasms (and pain). 11/18/14  Yes Clayton Bibles, PA-C  methylPREDNISolone (MEDROL DOSEPAK) 4 MG tablet Take 4 mg by mouth. follow package directions 11/29/14  Yes Historical Provider, MD  Multiple Vitamin (MULTIVITAMIN) tablet Take 1 tablet by mouth daily.     Yes Historical Provider, MD  oxyCODONE-acetaminophen (PERCOCET/ROXICET) 5-325 MG per tablet Take 1-2 tablets by mouth every 4 (four) hours as needed for severe pain. 11/22/14  Yes Houston Siren III, MD  psyllium (METAMUCIL SMOOTH TEXTURE) 28 % packet Take 1 packet by mouth daily.    Yes Historical Provider, MD  RAPAFLO 8 MG CAPS capsule Take 8 mg by mouth daily with breakfast.  09/05/14  Yes  Historical Provider, MD    Family History  Problem Relation Age of Onset  . Pancreatic cancer Father   . Aneurysm Brother     History   Social History  . Marital Status: Married    Spouse Name: N/A    Number of Children: 3  . 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 Smoking status: Former Smoker -- 1.00 packs/day for 47 years    Types: Cigarettes    Quit date: 10/29/2000  . Smokeless tobacco: Former Systems developer  . Alcohol Use: 0.0 oz/week    0 Not specified per week     Comment: socially- nightly 1 coctail  . Drug Use: No  . Sexual Activity: None   Other Topics Concern  . None   Social History Narrative    Review of Systems: A 12 point ROS discussed and pertinent positives are indicated in the HPI above.  All other systems are negative.  Review of Systems  Vital Signs: BP 135/80 mmHg  Pulse 85  Temp(Src) 97.9 F (36.6 C) (Oral)  Resp 20  Ht 6' (1.829 m)  Wt 190 lb (86.183 kg)  BMI 25.76 kg/m2  SpO2 99%  Physical Exam  Constitutional: He is oriented to person, place, and time. No distress.  HENT:  Head: Normocephalic and atraumatic.  Neck: No tracheal deviation present.  Cardiovascular: Normal rate and regular rhythm.  Exam reveals no gallop and no friction rub.   No murmur heard. Pulmonary/Chest: Effort normal and breath sounds normal. No respiratory distress. He has no wheezes. He has no rales.  Abdominal: Soft. Bowel sounds are normal. He exhibits no distension. There is no tenderness.  Neurological: He is alert and oriented to person, place, and time.  Skin: He is not diaphoretic.    Imaging: Dg Lumbar Spine Complete  11/18/2014   CLINICAL DATA:  Low back pain radiating into the left like  EXAM: LUMBAR SPINE - COMPLETE 4+ VIEW  COMPARISON:  09/05/2014  FINDINGS: Five lumbar type vertebral bodies are well visualized. Degenerative changes are noted at L3-4, L4-5 and L5-S1. Grade 1  anterolisthesis of a degenerative nature is noted at L4-5. This has increased slightly in the interval from the prior exam. No other focal abnormality is noted.  IMPRESSION: Degenerative changes.  No definitive acute abnormality is noted.   Electronically Signed   By: Inez Catalina M.D.   On: 11/18/2014 10:51    Labs:  CBC:  Recent Labs  09/05/14 1154 11/02/14 1104 12/07/14 0903  WBC 7.7 5.0 9.9  HGB 14.7 15.0 15.4  HCT 43.6 45.6 45.4  PLT 203 201 268    COAGS:  Recent Labs  12/07/14 0903  INR 1.06  APTT 27    BMP:  Recent Labs  09/05/14 1154 10/26/14 1521 11/02/14 1104  NA 142 143 145  K 4.2 4.0 3.6  CL 104 106  --   CO2 29 32 31*  GLUCOSE 114* 119* 83  BUN 11 12 10.0  CALCIUM 8.9 9.0 9.3  CREATININE 0.85 0.8 0.8  GFRNONAA 84*  --   --   GFRAA >90  --   --     LIVER FUNCTION TESTS:  Recent Labs  09/05/14 1154 11/02/14 1104  BILITOT 0.6 0.85  AST 20 21  ALT 24 24  ALKPHOS 81 79  PROT 6.8 7.1  ALBUMIN 3.6 3.9    Assessment and Plan: Left upper quadrant mass seen on CT with low uptake PET Scheduled today for CT guided biopsy with moderate sedation Patient has been NPO, no blood thinners taken, labs reviewed Risks and Benefits discussed with the patient including but not limited to bleeding, infection, pneumothorax or diaphragmatic pain. All of the patient's questions were answered, patient is agreeable to proceed. Consent signed and in chart.   SignedHedy Jacob 12/07/2014, 10:16 AM

## 2014-12-09 DIAGNOSIS — M47816 Spondylosis without myelopathy or radiculopathy, lumbar region: Secondary | ICD-10-CM | POA: Diagnosis not present

## 2014-12-10 DIAGNOSIS — M67911 Unspecified disorder of synovium and tendon, right shoulder: Secondary | ICD-10-CM | POA: Diagnosis not present

## 2014-12-10 DIAGNOSIS — M5416 Radiculopathy, lumbar region: Secondary | ICD-10-CM | POA: Diagnosis not present

## 2014-12-15 DIAGNOSIS — M5416 Radiculopathy, lumbar region: Secondary | ICD-10-CM | POA: Diagnosis not present

## 2014-12-16 DIAGNOSIS — M5416 Radiculopathy, lumbar region: Secondary | ICD-10-CM | POA: Diagnosis not present

## 2014-12-21 DIAGNOSIS — M5416 Radiculopathy, lumbar region: Secondary | ICD-10-CM | POA: Diagnosis not present

## 2014-12-24 DIAGNOSIS — M67911 Unspecified disorder of synovium and tendon, right shoulder: Secondary | ICD-10-CM | POA: Diagnosis not present

## 2015-01-18 DIAGNOSIS — G5762 Lesion of plantar nerve, left lower limb: Secondary | ICD-10-CM | POA: Diagnosis not present

## 2015-01-18 DIAGNOSIS — G5761 Lesion of plantar nerve, right lower limb: Secondary | ICD-10-CM | POA: Diagnosis not present

## 2015-01-18 DIAGNOSIS — M7752 Other enthesopathy of left foot: Secondary | ICD-10-CM | POA: Diagnosis not present

## 2015-01-18 DIAGNOSIS — M7751 Other enthesopathy of right foot: Secondary | ICD-10-CM | POA: Diagnosis not present

## 2015-01-25 DIAGNOSIS — M7751 Other enthesopathy of right foot: Secondary | ICD-10-CM | POA: Diagnosis not present

## 2015-01-25 DIAGNOSIS — M7752 Other enthesopathy of left foot: Secondary | ICD-10-CM | POA: Diagnosis not present

## 2015-01-25 DIAGNOSIS — G5761 Lesion of plantar nerve, right lower limb: Secondary | ICD-10-CM | POA: Diagnosis not present

## 2015-01-25 DIAGNOSIS — G5762 Lesion of plantar nerve, left lower limb: Secondary | ICD-10-CM | POA: Diagnosis not present

## 2015-01-26 DIAGNOSIS — H02834 Dermatochalasis of left upper eyelid: Secondary | ICD-10-CM | POA: Diagnosis not present

## 2015-01-26 DIAGNOSIS — H2513 Age-related nuclear cataract, bilateral: Secondary | ICD-10-CM | POA: Diagnosis not present

## 2015-01-26 DIAGNOSIS — H02831 Dermatochalasis of right upper eyelid: Secondary | ICD-10-CM | POA: Diagnosis not present

## 2015-01-26 DIAGNOSIS — H04123 Dry eye syndrome of bilateral lacrimal glands: Secondary | ICD-10-CM | POA: Diagnosis not present

## 2015-02-08 ENCOUNTER — Other Ambulatory Visit: Payer: Self-pay | Admitting: Gastroenterology

## 2015-02-08 DIAGNOSIS — D127 Benign neoplasm of rectosigmoid junction: Secondary | ICD-10-CM | POA: Diagnosis not present

## 2015-02-08 DIAGNOSIS — D124 Benign neoplasm of descending colon: Secondary | ICD-10-CM | POA: Diagnosis not present

## 2015-02-08 DIAGNOSIS — Z09 Encounter for follow-up examination after completed treatment for conditions other than malignant neoplasm: Secondary | ICD-10-CM | POA: Diagnosis not present

## 2015-02-08 DIAGNOSIS — K621 Rectal polyp: Secondary | ICD-10-CM | POA: Diagnosis not present

## 2015-02-08 DIAGNOSIS — D125 Benign neoplasm of sigmoid colon: Secondary | ICD-10-CM | POA: Diagnosis not present

## 2015-02-08 DIAGNOSIS — K640 First degree hemorrhoids: Secondary | ICD-10-CM | POA: Diagnosis not present

## 2015-02-08 DIAGNOSIS — D122 Benign neoplasm of ascending colon: Secondary | ICD-10-CM | POA: Diagnosis not present

## 2015-02-08 DIAGNOSIS — K5730 Diverticulosis of large intestine without perforation or abscess without bleeding: Secondary | ICD-10-CM | POA: Diagnosis not present

## 2015-02-08 DIAGNOSIS — K635 Polyp of colon: Secondary | ICD-10-CM | POA: Diagnosis not present

## 2015-02-08 DIAGNOSIS — Z86010 Personal history of colonic polyps: Secondary | ICD-10-CM | POA: Diagnosis not present

## 2015-02-10 DIAGNOSIS — M5442 Lumbago with sciatica, left side: Secondary | ICD-10-CM | POA: Diagnosis not present

## 2015-02-10 DIAGNOSIS — R202 Paresthesia of skin: Secondary | ICD-10-CM | POA: Diagnosis not present

## 2015-02-10 DIAGNOSIS — M5417 Radiculopathy, lumbosacral region: Secondary | ICD-10-CM | POA: Diagnosis not present

## 2015-02-10 DIAGNOSIS — G5602 Carpal tunnel syndrome, left upper limb: Secondary | ICD-10-CM | POA: Diagnosis not present

## 2015-02-10 DIAGNOSIS — G603 Idiopathic progressive neuropathy: Secondary | ICD-10-CM | POA: Diagnosis not present

## 2015-02-10 DIAGNOSIS — G609 Hereditary and idiopathic neuropathy, unspecified: Secondary | ICD-10-CM | POA: Diagnosis not present

## 2015-02-10 DIAGNOSIS — R634 Abnormal weight loss: Secondary | ICD-10-CM | POA: Diagnosis not present

## 2015-02-10 DIAGNOSIS — M79671 Pain in right foot: Secondary | ICD-10-CM | POA: Diagnosis not present

## 2015-02-10 DIAGNOSIS — E559 Vitamin D deficiency, unspecified: Secondary | ICD-10-CM | POA: Diagnosis not present

## 2015-04-07 DIAGNOSIS — R1030 Lower abdominal pain, unspecified: Secondary | ICD-10-CM | POA: Diagnosis not present

## 2015-04-13 DIAGNOSIS — L259 Unspecified contact dermatitis, unspecified cause: Secondary | ICD-10-CM | POA: Diagnosis not present

## 2015-04-13 DIAGNOSIS — D239 Other benign neoplasm of skin, unspecified: Secondary | ICD-10-CM | POA: Diagnosis not present

## 2015-04-14 DIAGNOSIS — R972 Elevated prostate specific antigen [PSA]: Secondary | ICD-10-CM | POA: Diagnosis not present

## 2015-04-14 DIAGNOSIS — N401 Enlarged prostate with lower urinary tract symptoms: Secondary | ICD-10-CM | POA: Diagnosis not present

## 2015-04-20 DIAGNOSIS — R1030 Lower abdominal pain, unspecified: Secondary | ICD-10-CM | POA: Diagnosis not present

## 2015-04-21 DIAGNOSIS — R972 Elevated prostate specific antigen [PSA]: Secondary | ICD-10-CM | POA: Diagnosis not present

## 2015-04-21 DIAGNOSIS — N138 Other obstructive and reflux uropathy: Secondary | ICD-10-CM | POA: Diagnosis not present

## 2015-04-21 DIAGNOSIS — N401 Enlarged prostate with lower urinary tract symptoms: Secondary | ICD-10-CM | POA: Diagnosis not present

## 2015-04-21 DIAGNOSIS — R350 Frequency of micturition: Secondary | ICD-10-CM | POA: Diagnosis not present

## 2015-04-22 DIAGNOSIS — M67911 Unspecified disorder of synovium and tendon, right shoulder: Secondary | ICD-10-CM | POA: Diagnosis not present

## 2015-04-28 DIAGNOSIS — M25511 Pain in right shoulder: Secondary | ICD-10-CM | POA: Diagnosis not present

## 2015-05-16 DIAGNOSIS — M75121 Complete rotator cuff tear or rupture of right shoulder, not specified as traumatic: Secondary | ICD-10-CM | POA: Diagnosis not present

## 2015-05-17 DIAGNOSIS — M75121 Complete rotator cuff tear or rupture of right shoulder, not specified as traumatic: Secondary | ICD-10-CM | POA: Diagnosis not present

## 2015-05-17 DIAGNOSIS — G8918 Other acute postprocedural pain: Secondary | ICD-10-CM | POA: Diagnosis not present

## 2015-05-17 DIAGNOSIS — M7581 Other shoulder lesions, right shoulder: Secondary | ICD-10-CM | POA: Diagnosis not present

## 2015-05-17 DIAGNOSIS — M7541 Impingement syndrome of right shoulder: Secondary | ICD-10-CM | POA: Diagnosis not present

## 2015-05-17 DIAGNOSIS — M75111 Incomplete rotator cuff tear or rupture of right shoulder, not specified as traumatic: Secondary | ICD-10-CM | POA: Diagnosis not present

## 2015-05-17 DIAGNOSIS — M19011 Primary osteoarthritis, right shoulder: Secondary | ICD-10-CM | POA: Diagnosis not present

## 2015-05-17 DIAGNOSIS — M66321 Spontaneous rupture of flexor tendons, right upper arm: Secondary | ICD-10-CM | POA: Diagnosis not present

## 2015-05-23 DIAGNOSIS — Z9889 Other specified postprocedural states: Secondary | ICD-10-CM | POA: Diagnosis not present

## 2015-05-26 DIAGNOSIS — M25611 Stiffness of right shoulder, not elsewhere classified: Secondary | ICD-10-CM | POA: Diagnosis not present

## 2015-05-26 DIAGNOSIS — M25511 Pain in right shoulder: Secondary | ICD-10-CM | POA: Diagnosis not present

## 2015-05-26 DIAGNOSIS — M75121 Complete rotator cuff tear or rupture of right shoulder, not specified as traumatic: Secondary | ICD-10-CM | POA: Diagnosis not present

## 2015-05-26 DIAGNOSIS — Z9889 Other specified postprocedural states: Secondary | ICD-10-CM | POA: Diagnosis not present

## 2015-06-02 DIAGNOSIS — M25511 Pain in right shoulder: Secondary | ICD-10-CM | POA: Diagnosis not present

## 2015-06-02 DIAGNOSIS — Z9889 Other specified postprocedural states: Secondary | ICD-10-CM | POA: Diagnosis not present

## 2015-06-02 DIAGNOSIS — M75121 Complete rotator cuff tear or rupture of right shoulder, not specified as traumatic: Secondary | ICD-10-CM | POA: Diagnosis not present

## 2015-06-02 DIAGNOSIS — M25611 Stiffness of right shoulder, not elsewhere classified: Secondary | ICD-10-CM | POA: Diagnosis not present

## 2015-06-08 DIAGNOSIS — Z9889 Other specified postprocedural states: Secondary | ICD-10-CM | POA: Diagnosis not present

## 2015-06-08 DIAGNOSIS — M25611 Stiffness of right shoulder, not elsewhere classified: Secondary | ICD-10-CM | POA: Diagnosis not present

## 2015-06-08 DIAGNOSIS — M25511 Pain in right shoulder: Secondary | ICD-10-CM | POA: Diagnosis not present

## 2015-06-08 DIAGNOSIS — M75121 Complete rotator cuff tear or rupture of right shoulder, not specified as traumatic: Secondary | ICD-10-CM | POA: Diagnosis not present

## 2015-06-10 DIAGNOSIS — Z9889 Other specified postprocedural states: Secondary | ICD-10-CM | POA: Diagnosis not present

## 2015-06-10 DIAGNOSIS — M75121 Complete rotator cuff tear or rupture of right shoulder, not specified as traumatic: Secondary | ICD-10-CM | POA: Diagnosis not present

## 2015-06-10 DIAGNOSIS — M25511 Pain in right shoulder: Secondary | ICD-10-CM | POA: Diagnosis not present

## 2015-06-10 DIAGNOSIS — M25611 Stiffness of right shoulder, not elsewhere classified: Secondary | ICD-10-CM | POA: Diagnosis not present

## 2015-06-13 DIAGNOSIS — M25511 Pain in right shoulder: Secondary | ICD-10-CM | POA: Diagnosis not present

## 2015-06-13 DIAGNOSIS — Z9889 Other specified postprocedural states: Secondary | ICD-10-CM | POA: Diagnosis not present

## 2015-06-13 DIAGNOSIS — M25611 Stiffness of right shoulder, not elsewhere classified: Secondary | ICD-10-CM | POA: Diagnosis not present

## 2015-06-13 DIAGNOSIS — M75121 Complete rotator cuff tear or rupture of right shoulder, not specified as traumatic: Secondary | ICD-10-CM | POA: Diagnosis not present

## 2015-06-16 DIAGNOSIS — Z9889 Other specified postprocedural states: Secondary | ICD-10-CM | POA: Diagnosis not present

## 2015-06-16 DIAGNOSIS — M25611 Stiffness of right shoulder, not elsewhere classified: Secondary | ICD-10-CM | POA: Diagnosis not present

## 2015-06-16 DIAGNOSIS — M75121 Complete rotator cuff tear or rupture of right shoulder, not specified as traumatic: Secondary | ICD-10-CM | POA: Diagnosis not present

## 2015-06-16 DIAGNOSIS — M25511 Pain in right shoulder: Secondary | ICD-10-CM | POA: Diagnosis not present

## 2015-06-22 DIAGNOSIS — M25511 Pain in right shoulder: Secondary | ICD-10-CM | POA: Diagnosis not present

## 2015-06-22 DIAGNOSIS — Z9889 Other specified postprocedural states: Secondary | ICD-10-CM | POA: Diagnosis not present

## 2015-06-22 DIAGNOSIS — M75121 Complete rotator cuff tear or rupture of right shoulder, not specified as traumatic: Secondary | ICD-10-CM | POA: Diagnosis not present

## 2015-06-22 DIAGNOSIS — M25611 Stiffness of right shoulder, not elsewhere classified: Secondary | ICD-10-CM | POA: Diagnosis not present

## 2015-06-24 DIAGNOSIS — M75121 Complete rotator cuff tear or rupture of right shoulder, not specified as traumatic: Secondary | ICD-10-CM | POA: Diagnosis not present

## 2015-06-24 DIAGNOSIS — Z9889 Other specified postprocedural states: Secondary | ICD-10-CM | POA: Diagnosis not present

## 2015-06-24 DIAGNOSIS — M25611 Stiffness of right shoulder, not elsewhere classified: Secondary | ICD-10-CM | POA: Diagnosis not present

## 2015-06-24 DIAGNOSIS — M25511 Pain in right shoulder: Secondary | ICD-10-CM | POA: Diagnosis not present

## 2015-06-27 DIAGNOSIS — M25611 Stiffness of right shoulder, not elsewhere classified: Secondary | ICD-10-CM | POA: Diagnosis not present

## 2015-06-27 DIAGNOSIS — Z9889 Other specified postprocedural states: Secondary | ICD-10-CM | POA: Diagnosis not present

## 2015-06-27 DIAGNOSIS — M75121 Complete rotator cuff tear or rupture of right shoulder, not specified as traumatic: Secondary | ICD-10-CM | POA: Diagnosis not present

## 2015-06-27 DIAGNOSIS — M25511 Pain in right shoulder: Secondary | ICD-10-CM | POA: Diagnosis not present

## 2015-06-30 DIAGNOSIS — M75121 Complete rotator cuff tear or rupture of right shoulder, not specified as traumatic: Secondary | ICD-10-CM | POA: Diagnosis not present

## 2015-06-30 DIAGNOSIS — Z9889 Other specified postprocedural states: Secondary | ICD-10-CM | POA: Diagnosis not present

## 2015-06-30 DIAGNOSIS — M25611 Stiffness of right shoulder, not elsewhere classified: Secondary | ICD-10-CM | POA: Diagnosis not present

## 2015-06-30 DIAGNOSIS — M25511 Pain in right shoulder: Secondary | ICD-10-CM | POA: Diagnosis not present

## 2015-07-05 DIAGNOSIS — M75121 Complete rotator cuff tear or rupture of right shoulder, not specified as traumatic: Secondary | ICD-10-CM | POA: Diagnosis not present

## 2015-07-05 DIAGNOSIS — M25511 Pain in right shoulder: Secondary | ICD-10-CM | POA: Diagnosis not present

## 2015-07-05 DIAGNOSIS — M25611 Stiffness of right shoulder, not elsewhere classified: Secondary | ICD-10-CM | POA: Diagnosis not present

## 2015-07-05 DIAGNOSIS — Z9889 Other specified postprocedural states: Secondary | ICD-10-CM | POA: Diagnosis not present

## 2015-07-07 DIAGNOSIS — Z86010 Personal history of colonic polyps: Secondary | ICD-10-CM | POA: Diagnosis not present

## 2015-07-07 DIAGNOSIS — Z79899 Other long term (current) drug therapy: Secondary | ICD-10-CM | POA: Diagnosis not present

## 2015-07-07 DIAGNOSIS — G609 Hereditary and idiopathic neuropathy, unspecified: Secondary | ICD-10-CM | POA: Diagnosis not present

## 2015-07-07 DIAGNOSIS — N400 Enlarged prostate without lower urinary tract symptoms: Secondary | ICD-10-CM | POA: Diagnosis not present

## 2015-07-07 DIAGNOSIS — I471 Supraventricular tachycardia: Secondary | ICD-10-CM | POA: Diagnosis not present

## 2015-07-07 DIAGNOSIS — E785 Hyperlipidemia, unspecified: Secondary | ICD-10-CM | POA: Diagnosis not present

## 2015-07-07 DIAGNOSIS — Z23 Encounter for immunization: Secondary | ICD-10-CM | POA: Diagnosis not present

## 2015-07-08 DIAGNOSIS — Z9889 Other specified postprocedural states: Secondary | ICD-10-CM | POA: Diagnosis not present

## 2015-07-08 DIAGNOSIS — M25611 Stiffness of right shoulder, not elsewhere classified: Secondary | ICD-10-CM | POA: Diagnosis not present

## 2015-07-08 DIAGNOSIS — M75121 Complete rotator cuff tear or rupture of right shoulder, not specified as traumatic: Secondary | ICD-10-CM | POA: Diagnosis not present

## 2015-07-08 DIAGNOSIS — M25511 Pain in right shoulder: Secondary | ICD-10-CM | POA: Diagnosis not present

## 2015-07-15 DIAGNOSIS — M75121 Complete rotator cuff tear or rupture of right shoulder, not specified as traumatic: Secondary | ICD-10-CM | POA: Diagnosis not present

## 2015-07-15 DIAGNOSIS — M25611 Stiffness of right shoulder, not elsewhere classified: Secondary | ICD-10-CM | POA: Diagnosis not present

## 2015-07-15 DIAGNOSIS — Z9889 Other specified postprocedural states: Secondary | ICD-10-CM | POA: Diagnosis not present

## 2015-07-15 DIAGNOSIS — M25551 Pain in right hip: Secondary | ICD-10-CM | POA: Diagnosis not present

## 2015-07-19 DIAGNOSIS — M25511 Pain in right shoulder: Secondary | ICD-10-CM | POA: Diagnosis not present

## 2015-07-19 DIAGNOSIS — M75121 Complete rotator cuff tear or rupture of right shoulder, not specified as traumatic: Secondary | ICD-10-CM | POA: Diagnosis not present

## 2015-07-19 DIAGNOSIS — M25611 Stiffness of right shoulder, not elsewhere classified: Secondary | ICD-10-CM | POA: Diagnosis not present

## 2015-07-19 DIAGNOSIS — Z9889 Other specified postprocedural states: Secondary | ICD-10-CM | POA: Diagnosis not present

## 2015-07-22 DIAGNOSIS — M75121 Complete rotator cuff tear or rupture of right shoulder, not specified as traumatic: Secondary | ICD-10-CM | POA: Diagnosis not present

## 2015-07-22 DIAGNOSIS — M25611 Stiffness of right shoulder, not elsewhere classified: Secondary | ICD-10-CM | POA: Diagnosis not present

## 2015-07-22 DIAGNOSIS — Z9889 Other specified postprocedural states: Secondary | ICD-10-CM | POA: Diagnosis not present

## 2015-07-22 DIAGNOSIS — M25511 Pain in right shoulder: Secondary | ICD-10-CM | POA: Diagnosis not present

## 2015-07-25 DIAGNOSIS — Z9889 Other specified postprocedural states: Secondary | ICD-10-CM | POA: Diagnosis not present

## 2015-07-26 DIAGNOSIS — M25511 Pain in right shoulder: Secondary | ICD-10-CM | POA: Diagnosis not present

## 2015-07-26 DIAGNOSIS — Z9889 Other specified postprocedural states: Secondary | ICD-10-CM | POA: Diagnosis not present

## 2015-07-26 DIAGNOSIS — M25611 Stiffness of right shoulder, not elsewhere classified: Secondary | ICD-10-CM | POA: Diagnosis not present

## 2015-07-26 DIAGNOSIS — M75121 Complete rotator cuff tear or rupture of right shoulder, not specified as traumatic: Secondary | ICD-10-CM | POA: Diagnosis not present

## 2015-07-29 DIAGNOSIS — M25611 Stiffness of right shoulder, not elsewhere classified: Secondary | ICD-10-CM | POA: Diagnosis not present

## 2015-07-29 DIAGNOSIS — Z9889 Other specified postprocedural states: Secondary | ICD-10-CM | POA: Diagnosis not present

## 2015-07-29 DIAGNOSIS — M75121 Complete rotator cuff tear or rupture of right shoulder, not specified as traumatic: Secondary | ICD-10-CM | POA: Diagnosis not present

## 2015-07-29 DIAGNOSIS — M25511 Pain in right shoulder: Secondary | ICD-10-CM | POA: Diagnosis not present

## 2015-08-02 DIAGNOSIS — M25611 Stiffness of right shoulder, not elsewhere classified: Secondary | ICD-10-CM | POA: Diagnosis not present

## 2015-08-02 DIAGNOSIS — M25511 Pain in right shoulder: Secondary | ICD-10-CM | POA: Diagnosis not present

## 2015-08-02 DIAGNOSIS — M75121 Complete rotator cuff tear or rupture of right shoulder, not specified as traumatic: Secondary | ICD-10-CM | POA: Diagnosis not present

## 2015-08-05 DIAGNOSIS — M25611 Stiffness of right shoulder, not elsewhere classified: Secondary | ICD-10-CM | POA: Diagnosis not present

## 2015-08-05 DIAGNOSIS — M75121 Complete rotator cuff tear or rupture of right shoulder, not specified as traumatic: Secondary | ICD-10-CM | POA: Diagnosis not present

## 2015-08-05 DIAGNOSIS — M25511 Pain in right shoulder: Secondary | ICD-10-CM | POA: Diagnosis not present

## 2015-08-08 DIAGNOSIS — M25611 Stiffness of right shoulder, not elsewhere classified: Secondary | ICD-10-CM | POA: Diagnosis not present

## 2015-08-08 DIAGNOSIS — M75121 Complete rotator cuff tear or rupture of right shoulder, not specified as traumatic: Secondary | ICD-10-CM | POA: Diagnosis not present

## 2015-08-08 DIAGNOSIS — M25511 Pain in right shoulder: Secondary | ICD-10-CM | POA: Diagnosis not present

## 2015-08-11 DIAGNOSIS — M5417 Radiculopathy, lumbosacral region: Secondary | ICD-10-CM | POA: Diagnosis not present

## 2015-08-11 DIAGNOSIS — G5602 Carpal tunnel syndrome, left upper limb: Secondary | ICD-10-CM | POA: Diagnosis not present

## 2015-08-11 DIAGNOSIS — R202 Paresthesia of skin: Secondary | ICD-10-CM | POA: Diagnosis not present

## 2015-08-11 DIAGNOSIS — G603 Idiopathic progressive neuropathy: Secondary | ICD-10-CM | POA: Diagnosis not present

## 2015-08-12 DIAGNOSIS — M25611 Stiffness of right shoulder, not elsewhere classified: Secondary | ICD-10-CM | POA: Diagnosis not present

## 2015-08-12 DIAGNOSIS — M75121 Complete rotator cuff tear or rupture of right shoulder, not specified as traumatic: Secondary | ICD-10-CM | POA: Diagnosis not present

## 2015-08-12 DIAGNOSIS — M25511 Pain in right shoulder: Secondary | ICD-10-CM | POA: Diagnosis not present

## 2015-08-16 DIAGNOSIS — M25611 Stiffness of right shoulder, not elsewhere classified: Secondary | ICD-10-CM | POA: Diagnosis not present

## 2015-08-16 DIAGNOSIS — M25511 Pain in right shoulder: Secondary | ICD-10-CM | POA: Diagnosis not present

## 2015-08-16 DIAGNOSIS — M75121 Complete rotator cuff tear or rupture of right shoulder, not specified as traumatic: Secondary | ICD-10-CM | POA: Diagnosis not present

## 2015-08-19 DIAGNOSIS — M25511 Pain in right shoulder: Secondary | ICD-10-CM | POA: Diagnosis not present

## 2015-08-19 DIAGNOSIS — M75121 Complete rotator cuff tear or rupture of right shoulder, not specified as traumatic: Secondary | ICD-10-CM | POA: Diagnosis not present

## 2015-08-19 DIAGNOSIS — M25611 Stiffness of right shoulder, not elsewhere classified: Secondary | ICD-10-CM | POA: Diagnosis not present

## 2015-09-05 DIAGNOSIS — Z09 Encounter for follow-up examination after completed treatment for conditions other than malignant neoplasm: Secondary | ICD-10-CM | POA: Diagnosis not present

## 2015-09-08 DIAGNOSIS — M25611 Stiffness of right shoulder, not elsewhere classified: Secondary | ICD-10-CM | POA: Diagnosis not present

## 2015-09-08 DIAGNOSIS — M75121 Complete rotator cuff tear or rupture of right shoulder, not specified as traumatic: Secondary | ICD-10-CM | POA: Diagnosis not present

## 2015-09-08 DIAGNOSIS — M25511 Pain in right shoulder: Secondary | ICD-10-CM | POA: Diagnosis not present

## 2015-10-04 ENCOUNTER — Other Ambulatory Visit: Payer: Self-pay | Admitting: Dermatology

## 2015-10-04 DIAGNOSIS — C4491 Basal cell carcinoma of skin, unspecified: Secondary | ICD-10-CM

## 2015-10-04 DIAGNOSIS — D485 Neoplasm of uncertain behavior of skin: Secondary | ICD-10-CM | POA: Diagnosis not present

## 2015-10-04 DIAGNOSIS — D0439 Carcinoma in situ of skin of other parts of face: Secondary | ICD-10-CM | POA: Diagnosis not present

## 2015-10-04 DIAGNOSIS — C4492 Squamous cell carcinoma of skin, unspecified: Secondary | ICD-10-CM

## 2015-10-04 DIAGNOSIS — L570 Actinic keratosis: Secondary | ICD-10-CM | POA: Diagnosis not present

## 2015-10-04 DIAGNOSIS — D0430 Carcinoma in situ of skin of unspecified part of face: Secondary | ICD-10-CM | POA: Diagnosis not present

## 2015-10-04 DIAGNOSIS — C44619 Basal cell carcinoma of skin of left upper limb, including shoulder: Secondary | ICD-10-CM | POA: Diagnosis not present

## 2015-10-04 HISTORY — DX: Basal cell carcinoma of skin, unspecified: C44.91

## 2015-10-04 HISTORY — DX: Squamous cell carcinoma of skin, unspecified: C44.92

## 2015-10-13 DIAGNOSIS — N644 Mastodynia: Secondary | ICD-10-CM | POA: Diagnosis not present

## 2015-10-27 ENCOUNTER — Other Ambulatory Visit: Payer: Self-pay | Admitting: Dermatology

## 2015-10-27 DIAGNOSIS — L570 Actinic keratosis: Secondary | ICD-10-CM | POA: Diagnosis not present

## 2015-10-27 DIAGNOSIS — N401 Enlarged prostate with lower urinary tract symptoms: Secondary | ICD-10-CM | POA: Diagnosis not present

## 2015-10-27 DIAGNOSIS — D0430 Carcinoma in situ of skin of unspecified part of face: Secondary | ICD-10-CM | POA: Diagnosis not present

## 2015-10-27 DIAGNOSIS — R350 Frequency of micturition: Secondary | ICD-10-CM | POA: Diagnosis not present

## 2015-10-27 DIAGNOSIS — C44519 Basal cell carcinoma of skin of other part of trunk: Secondary | ICD-10-CM | POA: Diagnosis not present

## 2015-10-27 DIAGNOSIS — N138 Other obstructive and reflux uropathy: Secondary | ICD-10-CM | POA: Diagnosis not present

## 2015-10-27 DIAGNOSIS — D485 Neoplasm of uncertain behavior of skin: Secondary | ICD-10-CM | POA: Diagnosis not present

## 2015-11-04 DIAGNOSIS — Z09 Encounter for follow-up examination after completed treatment for conditions other than malignant neoplasm: Secondary | ICD-10-CM | POA: Diagnosis not present

## 2015-11-18 ENCOUNTER — Other Ambulatory Visit: Payer: Self-pay | Admitting: Surgery

## 2015-11-18 DIAGNOSIS — N63 Unspecified lump in breast: Secondary | ICD-10-CM | POA: Diagnosis not present

## 2015-11-18 DIAGNOSIS — N6320 Unspecified lump in the left breast, unspecified quadrant: Secondary | ICD-10-CM

## 2015-11-28 ENCOUNTER — Ambulatory Visit
Admission: RE | Admit: 2015-11-28 | Discharge: 2015-11-28 | Disposition: A | Discharge status: Home / Self Care | Payer: Medicare Other | Source: Ambulatory Visit | Attending: Surgery | Admitting: Surgery

## 2015-11-28 ENCOUNTER — Other Ambulatory Visit: Payer: Self-pay | Admitting: Surgery

## 2015-11-28 DIAGNOSIS — N6320 Unspecified lump in the left breast, unspecified quadrant: Secondary | ICD-10-CM

## 2015-11-28 DIAGNOSIS — R928 Other abnormal and inconclusive findings on diagnostic imaging of breast: Secondary | ICD-10-CM | POA: Diagnosis not present

## 2015-12-07 ENCOUNTER — Other Ambulatory Visit: Payer: Self-pay | Admitting: Dermatology

## 2015-12-07 DIAGNOSIS — L089 Local infection of the skin and subcutaneous tissue, unspecified: Secondary | ICD-10-CM | POA: Diagnosis not present

## 2015-12-07 DIAGNOSIS — D485 Neoplasm of uncertain behavior of skin: Secondary | ICD-10-CM | POA: Diagnosis not present

## 2016-01-31 DIAGNOSIS — H10413 Chronic giant papillary conjunctivitis, bilateral: Secondary | ICD-10-CM | POA: Diagnosis not present

## 2016-01-31 DIAGNOSIS — H02831 Dermatochalasis of right upper eyelid: Secondary | ICD-10-CM | POA: Diagnosis not present

## 2016-01-31 DIAGNOSIS — H04123 Dry eye syndrome of bilateral lacrimal glands: Secondary | ICD-10-CM | POA: Diagnosis not present

## 2016-01-31 DIAGNOSIS — H2513 Age-related nuclear cataract, bilateral: Secondary | ICD-10-CM | POA: Diagnosis not present

## 2016-01-31 DIAGNOSIS — H02834 Dermatochalasis of left upper eyelid: Secondary | ICD-10-CM | POA: Diagnosis not present

## 2016-02-23 DIAGNOSIS — G629 Polyneuropathy, unspecified: Secondary | ICD-10-CM | POA: Diagnosis not present

## 2016-03-08 ENCOUNTER — Encounter: Payer: Self-pay | Admitting: Neurology

## 2016-03-08 ENCOUNTER — Ambulatory Visit (INDEPENDENT_AMBULATORY_CARE_PROVIDER_SITE_OTHER): Payer: Medicare Other | Admitting: Neurology

## 2016-03-08 VITALS — BP 126/79 | HR 94 | Ht 72.0 in | Wt 204.4 lb

## 2016-03-08 DIAGNOSIS — G629 Polyneuropathy, unspecified: Secondary | ICD-10-CM | POA: Diagnosis not present

## 2016-03-08 DIAGNOSIS — R251 Tremor, unspecified: Secondary | ICD-10-CM | POA: Diagnosis not present

## 2016-03-08 DIAGNOSIS — R7302 Impaired glucose tolerance (oral): Secondary | ICD-10-CM

## 2016-03-08 DIAGNOSIS — E538 Deficiency of other specified B group vitamins: Secondary | ICD-10-CM

## 2016-03-08 DIAGNOSIS — G709 Myoneural disorder, unspecified: Secondary | ICD-10-CM

## 2016-03-08 DIAGNOSIS — E519 Thiamine deficiency, unspecified: Secondary | ICD-10-CM

## 2016-03-08 DIAGNOSIS — G6181 Chronic inflammatory demyelinating polyneuritis: Secondary | ICD-10-CM

## 2016-03-08 DIAGNOSIS — I776 Arteritis, unspecified: Secondary | ICD-10-CM | POA: Diagnosis not present

## 2016-03-08 DIAGNOSIS — E531 Pyridoxine deficiency: Secondary | ICD-10-CM | POA: Diagnosis not present

## 2016-03-08 DIAGNOSIS — G587 Mononeuritis multiplex: Secondary | ICD-10-CM

## 2016-03-08 NOTE — Patient Instructions (Signed)
Remember to drink plenty of fluid, eat healthy meals and do not skip any meals. Try to eat protein with a every meal and eat a healthy snack such as fruit or nuts in between meals. Try to keep a regular sleep-wake schedule and try to exercise daily, particularly in the form of walking, 20-30 minutes a day, if you can.   As far as diagnostic testing: labs today, emg/ncs. Discussed possible lumbar puncture.  I would like to see you back for emg/ncs, sooner if we need to. Please call us with any interim questions, concerns, problems, updates or refill requests.   Our phone number is [phone]. We also have an after hours call service for urgent matters and there is a physician on-call for urgent questions. For any emergencies you know to call 911 or go to the nearest emergency room

## 2016-03-08 NOTE — Progress Notes (Signed)
[facility]    Provider:  Dr Jaynee Eagles Referring Provider: Hulan Fess, MD Primary Care Physician:  Gennette Pac, MD  CC:  Pain in the lower legs  HPI:  [name] is a 75 y.o. male here as a referral from Dr. Rex Kras for peripheral neuropathy. PMHx HLD, neuroma, tendinitis, erectile dysfunction, hyperlipidemia, COPD. he has already been seen by neurology. He has had a neuroma for 3-4 years. He has stiffness in the feet. He has more of a feeling of stiffness and discomfort. No tingling, no numbness, no burning, not feeling cold. The sensations start in the feet, no cramping in the feet, he has tightness in the muscles. Massaging the feet doesn't help. They don't feel tight right now. Positional. More tightness with dorsiflexion of the foot and movements of the foot. But when laying in bed or sitting it is fine. Doesn't hurt in bed with the sheets touching the toes. No significant cramping. He walks a mile a day. He has tried cymbalta, lidocaine, neurontin without relief. No sensory changes in the hands. No weakness. He has not tried stretching, he went to physical therapy but it didn;t do anything. Symptoms started 3-4 years ago and felt like socks under his feet which has now resolved. He has been to three podiatrists. One gave him shots between the toes. Inserts in the shoes helped. No other focal neurologic deficits. No inciting events to the symptoms.  Reviewed notes, labs and imaging from outside physicians, which showed:   [place] 4+ VIEW. Personally reviewed images and agree with the following:  COMPARISON: 09/05/2014  FINDINGS: Five lumbar type vertebral bodies are well visualized. Degenerative changes are noted at L3-4, L4-5 and L5-S1. Grade 1 anterolisthesis of a degenerative nature is noted at L4-5. This has increased slightly in the interval from the prior exam. No other focal abnormality is noted.  IMPRESSION: Degenerative changes.  No definitive acute abnormality is noted.  Reviewed records from [facility]. Patient reported pain in both knees downwards the entire foot bilaterally consonant present for years but worse recently. He is seen by neurologist in some notes present on the chart. Apparently he had an EMG and nerve conduction study showing moderate peripheral neuropathy, apparently blood work was negative for metabolic causes but the details of those labs are not in the notes I am reviewing.. Patient tried topical lidocaine, nortriptyline, gabapentin 600 mg 3 times a day without relief. He also had an L4-L5 disc herniation treated with epidural shots. He has not tried Cymbalta. He did not want to go back to his previous neurology doctor and was referred to [facility]. Exam showed normal foot exam without swelling or erythema. Arterial pulses 2+ symmetric. Subjective pain in the knees downward to the feet. Preserve sharp and dull discrimination. 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 He is taking B12.   Reviewed MRI of the lumbar spine report: Images are not available for me to review. MRI of the lumbar spine was significant for mild disc and facet degeneration without stenosis at L2-L3 and L3-L4, L4-L5 he had diffuse disc bulging compressing the left L4 nerve root under the pedicle with moderate to severe facet hypertrophy and moderate spinal stenosis. At L5-S1 he had moderate disc degeneration with disc space narrowing and mild spurring no significant spinal stenosis.  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  Review of Systems: Patient complains of symptoms per HPI as well as the following symptoms: Hearing loss, ringing  in ears, snoring, joint pain, allergies, impotence. Pertinent negatives per HPI. All others negative.   Social History   Social History  . Marital Status: Married    Spouse Name: N/A  . Number of Children: 3  . Years of Education: 14   Occupational History  . sales-Retired   . 3 children    Social History Main Topics  . Smoking status: Former Smoker -- 1.00 packs/day for 47 years    Types: Cigarettes    Quit date: 10/29/2000  . Smokeless tobacco: Former Systems developer  . Alcohol Use: 0.0 oz/week    0 Standard drinks or equivalent per week     Comment: socially- nightly 1 coctail  . Drug Use: No  . Sexual Activity: Not on file   Other Topics Concern  . Not on file   Social History Narrative   Lives with wife   Caffeine use: decaf    Family History  Problem Relation Age of Onset  . Pancreatic cancer Father   . Aneurysm Brother   . Neuropathy Neg Hx     Past Medical History  Diagnosis Date  . Osteoarthritis   . Tendinitis     achiles heel spur  . Testicular atrophy   . ED (erectile dysfunction)   . History of colon polyps   . Diverticulosis     extensive 3'15.  . Palpitation     with svt rate 150  . Hyperlipidemia   . Dysrhythmia     hx. Ablation for tachycardia, no routine cardiology visits now  . Heart murmur     teenager  . History of kidney stones     x1 passed  . Neuromuscular disorder [facility])     "neuroma"     Past Surgical History  Procedure Laterality Date  . Laparoscopic appendectomy N/A 09/05/2014    Procedure: APPENDECTOMY LAPAROSCOPIC;  Surgeon: [name], MD;  Location: [facility];  Service: General;  Laterality: N/A;  . Hernia repair Bilateral     '05 bilateral hernia  . Knee arthroscopy Left     scope '08  . Hemorrhoid surgery      banding   . Eus N/A 11/17/2014    Procedure: ESOPHAGEAL ENDOSCOPIC ULTRASOUND (EUS) RADIAL;  Surgeon: [name], MD;  Location: [facility];  Service: Endoscopy;  Laterality: N/A;  . Fine needle aspiration  N/A 11/17/2014    Procedure: FINE NEEDLE ASPIRATION (FNA) LINEAR;  Surgeon: [name], MD;  Location: [facility];  Service: Endoscopy;  Laterality: N/A;  + or- fna    Current Outpatient Prescriptions  Medication Sig Dispense Refill  . aspirin 81 MG EC tablet Take 81 mg by mouth daily.      Marland Kitchen atorvastatin (LIPITOR) 10 MG tablet Take 10 mg by mouth daily.     Marland Kitchen b complex vitamins tablet Take 1 tablet by mouth daily.    . Calcium Carbonate-Vitamin D (CALCIUM 600+D) 600-400 MG-UNIT per tablet Take 1 tablet by mouth daily.      . DULoxetine (CYMBALTA) 30 MG capsule Take 30 mg by mouth daily.  12  . finasteride (PROSCAR) 5 MG tablet Take 5 mg by mouth daily.   2  . GLUCOSAMINE-CHONDROITIN-VIT C PO Take 1 tablet by mouth daily.      . Multiple Vitamin (MULTIVITAMIN) tablet Take 1 tablet by mouth daily.      Marland Kitchen  psyllium (METAMUCIL SMOOTH TEXTURE) 28 % packet Take 1 packet by mouth daily.     Marland Kitchen RAPAFLO 8 MG CAPS capsule Take 8 mg by mouth daily with breakfast.      No current facility-administered medications for this visit.    Allergies as of 03/08/2016 - Review Complete 03/08/2016  Allergen Reaction Noted  . Other  03/08/2016    Vitals: BP 126/79 mmHg  Pulse 94  Ht 6' (1.829 m)  Wt 204 lb 6.4 oz (92.715 kg)  BMI 27.72 kg/m2 Last Weight:  Wt Readings from Last 1 Encounters:  03/08/16 204 lb 6.4 oz (92.715 kg)   Last Height:   Ht Readings from Last 1 Encounters:  03/08/16 6' (1.829 m)   Physical exam: Exam: Gen: NAD, conversant, well nourised, obese, well groomed                     CV: RRR, no MRG. No Carotid Bruits. No peripheral edema, warm, nontender Eyes: Conjunctivae clear without exudates or hemorrhage  Neuro: Detailed Neurologic Exam  Speech:    Speech is normal; fluent and spontaneous with normal comprehension.  Cognition:    The patient is oriented to person, place, and time;     recent and remote memory intact;     language fluent;     normal attention,  concentration,     fund of knowledge Cranial Nerves:    The pupils are equal, round, and reactive to light. The fundi are normal and spontaneous venous pulsations are present. Visual fields are full to finger confrontation. Extraocular movements are intact. Trigeminal sensation is intact and the muscles of mastication are normal. The face is symmetric. The palate elevates in the midline. Hearing intact. Voice is normal. Shoulder shrug is normal. The tongue has normal motion without fasciculations.   Coordination:    Normal finger to nose and heel to shin. Normal rapid alternating movements.   Gait:    Heel-toe gait are normal. Significant imbalance on heel to toe. Romberg negative.   Motor Observation:    No asymmetry, no atrophy, and no involuntary movements noted. Tone:    Normal muscle tone.    Posture:    Posture is normal. normal erect    Strength:    Strength is V/V in the upper and lower limbs.      Sensation: Decreased sensation right lateral leg otherwise intact pinprick distally in the feet and hands, he denies any loss of pin prick distally.  Distal decrease in temperature in the lower extremities to the mid calf.     Loss of vibration at thegreat toes, mcp and medial malleoli. Can feel it in the knees.  Reflex Exam:  DTR's: Absent lowers, trace uppers.  Toes:    The toes are downgoing bilaterally.   Clonus:    Clonus is absent.       Assessment/Plan:  75 year old male here for second opinion of peripheral polyneuropathy. He was diagnosed by an outside neurologist. Neurologic exam significant for decreased temperature distally and absent vibration distally in the lower extremities. Absent lower extremity deep tendon reflexes.  Patient's symptoms do not sound like they are from peripheral polyneuropathy, he denies any sensory changes, the symptoms are more muscle tightness which are positional. However he does have more loss of temperature but more so absence of  vibration distally in the feet with absent tendon reflexes. We'll perform an extensive neuropathy serum evaluation, will repeat EMG nerve conduction studies, given his absent DTRs and loss  of vibration may consider lumbar puncture for evaluation of CIDP.    cc: Dr. Simmie Davies, MD  [facility] [address] [place], [place] [address]  Phone [phone] Fax [phone]

## 2016-03-09 ENCOUNTER — Encounter: Payer: Self-pay | Admitting: Neurology

## 2016-03-13 LAB — VITAMIN B6: Vitamin B6: 26 ug/L (ref 5.3–46.7)

## 2016-03-13 LAB — ANA COMPREHENSIVE PANEL
Anti JO-1: <0.2 AI (ref 0.0–0.9)
Centromere Ab Screen: <0.2 AI (ref 0.0–0.9)
Chromatin Ab SerPl-aCnc: <0.2 AI (ref 0.0–0.9)
ENA RNP Ab: 0.2 AI (ref 0.0–0.9)
ENA SM Ab Ser-aCnc: <0.2 AI (ref 0.0–0.9)
ENA SSA (RO) Ab: <0.2 AI (ref 0.0–0.9)
ENA SSB (LA) Ab: <0.2 AI (ref 0.0–0.9)
Scleroderma SCL-70: <0.2 AI (ref 0.0–0.9)
dsDNA Ab: <1 IU/mL (ref 0–9)

## 2016-03-13 LAB — COMPREHENSIVE METABOLIC PANEL
ALT: 15 IU/L (ref 0–44)
AST: 16 IU/L (ref 0–40)
Albumin/Globulin Ratio: 1.6 (ref 1.2–2.2)
Albumin: 4.2 g/dL (ref 3.5–4.8)
Alkaline Phosphatase: 78 IU/L (ref 39–117)
BUN/Creatinine Ratio: 14 (ref 10–24)
BUN: 11 mg/dL (ref 8–27)
Bilirubin Total: 0.9 mg/dL (ref 0.0–1.2)
CO2: 25 mmol/L (ref 18–29)
Calcium: 9.5 mg/dL (ref 8.6–10.2)
Chloride: 102 mmol/L (ref 96–106)
Creatinine, Ser: 0.76 mg/dL (ref 0.76–1.27)
GFR calc Af Amer: 102 mL/min/{1.73_m2} (ref >59)
GFR calc non Af Amer: 89 mL/min/{1.73_m2} (ref >59)
Globulin, Total: 2.6 g/dL (ref 1.5–4.5)
Glucose: 83 mg/dL (ref 65–99)
Potassium: 4 mmol/L (ref 3.5–5.2)
Sodium: 143 mmol/L (ref 134–144)
Total Protein: 6.8 g/dL (ref 6.0–8.5)

## 2016-03-13 LAB — B12 AND FOLATE PANEL
Folate: >20 ng/mL (ref >3.0)
Vitamin B-12: >2000 pg/mL — ABNORMAL HIGH (ref 211–946)

## 2016-03-13 LAB — RHEUMATOID FACTOR: Rhuematoid fact SerPl-aCnc: <10 IU/mL (ref 0.0–13.9)

## 2016-03-13 LAB — MULTIPLE MYELOMA PANEL, SERUM
Albumin SerPl Elph-Mcnc: 3.8 g/dL (ref 2.9–4.4)
Albumin/Glob SerPl: 1.3 (ref 0.7–1.7)
Alpha 1: 0.2 g/dL (ref 0.0–0.4)
Alpha2 Glob SerPl Elph-Mcnc: 0.8 g/dL (ref 0.4–1.0)
B-Globulin SerPl Elph-Mcnc: 1.1 g/dL (ref 0.7–1.3)
Gamma Glob SerPl Elph-Mcnc: 0.8 g/dL (ref 0.4–1.8)
Globulin, Total: 3 g/dL (ref 2.2–3.9)
IgA/Immunoglobulin A, Serum: 235 mg/dL (ref 61–437)
IgG (Immunoglobin G), Serum: 755 mg/dL (ref 700–1600)
IgM (Immunoglobulin M), Srm: 95 mg/dL (ref 15–143)

## 2016-03-13 LAB — PAN-ANCA
ANCA Proteinase 3: <3.5 U/mL (ref 0.0–3.5)
Atypical pANCA: <1:20 {titer}
C-ANCA: <1:20 {titer}
Myeloperoxidase Ab: <9 U/mL (ref 0.0–9.0)
P-ANCA: <1:20 {titer}

## 2016-03-13 LAB — RPR: RPR Ser Ql: NONREACTIVE

## 2016-03-13 LAB — GLIADIN ANTIBODIES, SERUM
Antigliadin Abs, IgA: 3 units (ref 0–19)
Gliadin IgG: 3 units (ref 0–19)

## 2016-03-13 LAB — CBC
Hematocrit: 44.9 % (ref 37.5–51.0)
Hemoglobin: 15.2 g/dL (ref 12.6–17.7)
MCH: 29.6 pg (ref 26.6–33.0)
MCHC: 33.9 g/dL (ref 31.5–35.7)
MCV: 88 fL (ref 79–97)
Platelets: 249 10*3/uL (ref 150–379)
RBC: 5.13 x10E6/uL (ref 4.14–5.80)
RDW: 14 % (ref 12.3–15.4)
WBC: 6 10*3/uL (ref 3.4–10.8)

## 2016-03-13 LAB — VITAMIN B1: Thiamine: 176.5 nmol/L (ref 66.5–200.0)

## 2016-03-13 LAB — ANGIOTENSIN CONVERTING ENZYME: Angio Convert Enzyme: 32 U/L (ref 14–82)

## 2016-03-13 LAB — HEMOGLOBIN A1C
Est. average glucose Bld gHb Est-mCnc: 123 mg/dL
Hgb A1c MFr Bld: 5.9 % — ABNORMAL HIGH (ref 4.8–5.6)

## 2016-03-13 LAB — B. BURGDORFI ANTIBODIES: Lyme IgG/IgM Ab: <0.91 {ISR} (ref 0.00–0.90)

## 2016-03-13 LAB — HEAVY METALS, BLOOD
Arsenic: 7 ug/L (ref 2–23)
Lead, Blood: 2 ug/dL (ref 0–19)
Mercury: 2.1 ug/L (ref 0.0–14.9)

## 2016-03-13 LAB — TSH: TSH: 1.33 u[IU]/mL (ref 0.450–4.500)

## 2016-03-13 LAB — HEPATITIS C ANTIBODY: Hep C Virus Ab: <0.1 s/co ratio (ref 0.0–0.9)

## 2016-03-13 LAB — TISSUE TRANSGLUTAMINASE, IGA: Transglutaminase IgA: <2 U/mL (ref 0–3)

## 2016-03-13 LAB — METHYLMALONIC ACID, SERUM: Methylmalonic Acid: 132 nmol/L (ref 0–378)

## 2016-03-14 ENCOUNTER — Telehealth: Payer: Self-pay | Admitting: *Deleted

## 2016-03-14 NOTE — Telephone Encounter (Signed)
-----   Message from Melvenia Beam, MD sent at 03/13/2016  5:19 PM EDT ----- Thanks everything good except hgba1c 5.9

## 2016-03-14 NOTE — Telephone Encounter (Signed)
Called and spoke to pt about lab results per Dr Jaynee Eagles note. Advised him to f/u with PCP to make sure this is monitored. He should try and limit the amount of carbs/sugars he is consuming. He asked I forward results to Dr Rex Kras (PCP). Advised I will do this. He also wanted a copy for himself. I advised he can either sign up for mychart or he can stop by the office and request a hard copy. He will have to sign a release form for this. He verbalized understanding. I routed results to PCP via EPIC.   Pt also stated he has been trying to call since last Friday about whether his insurance will cover upcoming EMG/NCS he has. He asked to speak to [place] Friday but she was not available. He is not sure who else he spoke with. He states he was told they would f/u on it and call him back Monday but he never heard anything. I advised I will send a message to our billing department and have them f/u with him. He verbalized understanding and appreciation.

## 2016-03-14 NOTE — Telephone Encounter (Signed)
Called and spoke to pt again about results. Relayed what I spoke to him about earlier. He stated Angie did call and speak to him about upcoming EMG/NCS and his insurance. I told him to call back if he has further questions.concerns. He verbalized understanding and appreciation.

## 2016-03-14 NOTE — Telephone Encounter (Signed)
Patient is calling back. He was given lab results this morning but he was at work and did not fully understand. He can be reached at [phone].

## 2016-03-14 NOTE — Telephone Encounter (Signed)
I will give him a call. Thanks

## 2016-04-06 ENCOUNTER — Telehealth: Payer: Self-pay | Admitting: *Deleted

## 2016-04-06 ENCOUNTER — Encounter: Payer: Self-pay | Admitting: Neurology

## 2016-04-06 ENCOUNTER — Ambulatory Visit (INDEPENDENT_AMBULATORY_CARE_PROVIDER_SITE_OTHER): Payer: Self-pay | Admitting: Diagnostic Neuroimaging

## 2016-04-06 ENCOUNTER — Ambulatory Visit (INDEPENDENT_AMBULATORY_CARE_PROVIDER_SITE_OTHER): Payer: Medicare Other | Admitting: Diagnostic Neuroimaging

## 2016-04-06 DIAGNOSIS — G629 Polyneuropathy, unspecified: Secondary | ICD-10-CM

## 2016-04-06 DIAGNOSIS — Z0289 Encounter for other administrative examinations: Secondary | ICD-10-CM

## 2016-04-06 DIAGNOSIS — G6181 Chronic inflammatory demyelinating polyneuritis: Secondary | ICD-10-CM

## 2016-04-06 NOTE — Procedures (Signed)
   [facility]  NCS (NERVE CONDUCTION STUDY) WITH EMG (ELECTROMYOGRAPHY) REPORT   STUDY DATE: 04/06/16 PATIENT NAME: [name] DOB: October 07, 1941 MRN: [id number]  ORDERING CLINICIAN: Sarina Ill, MD   TECHNOLOGIST: Laretta Alstrom  ELECTROMYOGRAPHER: Earlean Polka. Penumalli, MD  CLINICAL INFORMATION: 75 year old male with lower extremity tightness sensation and discomfort (worsening). Also with history of lumbar spinal stenosis and low back pain radiating to the left leg (improving). Exam notable for high arches and hammertoes.   FINDINGS: NERVE CONDUCTION STUDY: Right peroneal motor response has normal distal latency, decreased amplitude, normal conduction velocity and normal F-wave latency.  Left peroneal and bilateral tibial motor responses have prolonged distal latencies, decreased amplitudes, normal conduction velocities and normal F-wave latencies.  Bilateral H reflex responses are normal (tibial nerve stimulation).  Bilateral peroneal sensory responses are normal.   NEEDLE ELECTROMYOGRAPHY: Needle examination of right lower extremity vastus medialis and gastrocnemius muscles is normal. Right tibialis anterior has increased insertional activity, rare positive sharp waves and fibrillation potentials, and decreased motor unit recruitment on exertion. Right L5-S1 paraspinal muscles are normal. Left L5-S1 paraspinal muscles demonstrate 2+ positive sharp waves and there was potentials.   IMPRESSION:  Abnormal study demonstrate: 1. Motor responses demonstrate decreased amplitudes throughout, with normal F-wave latencies and normal conduction velocities. H reflex responses are normal. Sensory responses are normal. 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 2. Active denervation changes in  left L5-S1 paraspinal muscles may be consistent with lumbar nerve root irritation.    INTERPRETING PHYSICIAN:  Penni Bombard, MD Certified in Neurology, Neurophysiology and Neuroimaging  [facility] [address] [place], [place] [address] [phone]

## 2016-04-06 NOTE — Telephone Encounter (Signed)
Pt pick up a copy of his labs on 04/06/16.

## 2016-04-07 ENCOUNTER — Telehealth: Payer: Self-pay | Admitting: Neurology

## 2016-04-07 NOTE — Telephone Encounter (Signed)
[name], the emg/ncs results suggested that the symptoms in his feet may be coming from his low back, from pinched nerve or spinal stenosis. Why doesn't patient make a follow up with me in the office so we can discuss the work up to date. Would you also ask him where and what year he had his last MRI of the lumbar spine completed? I have the report but never had access to the actual images. I may requested a CD with the images so I can take a look myself.  thanks

## 2016-04-07 NOTE — Telephone Encounter (Signed)
Emg/ncs discussed with patient:  FINDINGS: NERVE CONDUCTION STUDY: Right peroneal motor response has normal distal latency, decreased amplitude, normal conduction velocity and normal F-wave latency.  Left peroneal and bilateral tibial motor responses have prolonged distal latencies, decreased amplitudes, normal conduction velocities and normal F-wave latencies.  Bilateral H reflex responses are normal (tibial nerve stimulation).  Bilateral peroneal sensory responses are normal.   NEEDLE ELECTROMYOGRAPHY: Needle examination of right lower extremity vastus medialis and gastrocnemius muscles is normal. Right tibialis anterior has increased insertional activity, rare positive sharp waves and fibrillation potentials, and decreased motor unit recruitment on exertion. Right L5-S1 paraspinal muscles are normal. Left L5-S1 paraspinal muscles demonstrate 2+ positive sharp waves and there was potentials.   IMPRESSION:  Abnormal study demonstrate: 1. Motor responses demonstrate decreased amplitudes throughout, with normal F-wave latencies and normal conduction velocities. H reflex responses are normal. Sensory responses are normal. 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 2. Active denervation changes in left L5-S1 paraspinal muscles may be consistent with lumbar nerve root irritation.

## 2016-04-09 ENCOUNTER — Telehealth: Payer: Self-pay | Admitting: Neurology

## 2016-04-09 NOTE — Telephone Encounter (Signed)
I called pt to discuss. No answer, left a message on home phone asking him to call me back.

## 2016-04-09 NOTE — Telephone Encounter (Signed)
A call patient to discuss results of EMG nerve conduction study. Left a message try back tomorrow.  NERVE CONDUCTION STUDY: Right peroneal motor response has normal distal latency, decreased amplitude, normal conduction velocity and normal F-wave latency.  Left peroneal and bilateral tibial motor responses have prolonged distal latencies, decreased amplitudes, normal conduction velocities and normal F-wave latencies.  Bilateral H reflex responses are normal (tibial nerve stimulation).  Bilateral peroneal sensory responses are normal.   NEEDLE ELECTROMYOGRAPHY: Needle examination of right lower extremity vastus medialis and gastrocnemius muscles is normal. Right tibialis anterior has increased insertional activity, rare positive sharp waves and fibrillation potentials, and decreased motor unit recruitment on exertion. Right L5-S1 paraspinal muscles are normal. Left L5-S1 paraspinal muscles demonstrate 2+ positive sharp waves and there was potentials.   IMPRESSION:  Abnormal study demonstrate: 1. Motor responses demonstrate decreased amplitudes throughout, with normal F-wave latencies and normal conduction velocities. H reflex responses are normal. Sensory responses are normal. 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 2. Active denervation changes in left L5-S1 paraspinal muscles may be consistent with lumbar nerve root irritation.

## 2016-04-09 NOTE — Progress Notes (Signed)
error 

## 2016-04-11 NOTE — Telephone Encounter (Addendum)
Pt returned Dr Cathren Laine call. Dr Jaynee Eagles was skypd but was in with a pt. Said she would call him back. Message relayed.

## 2016-04-11 NOTE — Telephone Encounter (Signed)
Dr Jaynee Eagles- please call pt, thank you Called and spoke to pt. Relayed information below. He verbalized understanding. Spoke to Dr Jaynee Eagles while pt on phone and she wanted to speak to pt. She will call pt back. Advised me not to make f/u. He states he had MRI lumbar about a year ago via [facility]. He is going to call and see if he can get a copy of CD for Dr Jaynee Eagles.

## 2016-04-18 ENCOUNTER — Other Ambulatory Visit: Payer: Self-pay | Admitting: Neurology

## 2016-04-18 DIAGNOSIS — G6181 Chronic inflammatory demyelinating polyneuritis: Secondary | ICD-10-CM

## 2016-04-26 ENCOUNTER — Ambulatory Visit
Admission: RE | Admit: 2016-04-26 | Discharge: 2016-04-26 | Disposition: A | Discharge status: Home / Self Care | Payer: Medicare Other | Source: Ambulatory Visit | Attending: Neurology | Admitting: Neurology

## 2016-04-26 ENCOUNTER — Other Ambulatory Visit: Payer: Self-pay | Admitting: Neurology

## 2016-04-26 DIAGNOSIS — G6181 Chronic inflammatory demyelinating polyneuritis: Secondary | ICD-10-CM | POA: Diagnosis not present

## 2016-04-26 LAB — CSF CELL COUNT WITH DIFFERENTIAL
Basophils, %: 0 %
Eosinophils, CSF: 0 %
Lymphs, CSF: 57 % (ref 40–80)
Monocyte/Macrophage: 10 % — ABNORMAL LOW (ref 15–45)
RBC Count, CSF: 0 cells/uL (ref 0–10)
Segmented Neutrophils-CSF: 3 % (ref 0–6)
WBC, CSF: 1 cells/uL (ref 0–5)

## 2016-04-26 LAB — PROTEIN, CSF: Total Protein, CSF: 47 mg/dL (ref 15–60)

## 2016-04-26 LAB — GLUCOSE, CSF: Glucose, CSF: 61 mg/dL (ref 43–76)

## 2016-04-26 NOTE — Discharge Instructions (Signed)

## 2016-04-26 NOTE — Progress Notes (Addendum)
One SST tube of blood drawn from right [facility] space for LP labs; site unremarkable.  Discharge instructions explained to patient and his wife.  Cristal Ford, RN

## 2016-04-27 ENCOUNTER — Telehealth: Payer: Self-pay

## 2016-04-27 NOTE — Telephone Encounter (Signed)
RN call patient that his csf looks good, his protein is normal, his glucose is normal. Still pending some labs but so far looks normal thanks. Pt verbalized understanding.

## 2016-04-27 NOTE — Telephone Encounter (Signed)
-----   Message from Melvenia Beam, MD sent at 04/26/2016  5:46 PM EDT ----- Patient's csf looks good, his protein is normal, his glucose is normal. Still pending some labs but so far looks normal thanks

## 2016-05-01 LAB — OLIGOCLONAL BANDS, CSF + SERM

## 2016-05-02 ENCOUNTER — Encounter: Payer: Self-pay | Admitting: *Deleted

## 2016-05-02 NOTE — Progress Notes (Signed)
Dr Jaynee Eagles- received LP lab results via fax from [facility]. I placed with your notes from today. I can call pt once reviewed if you want, thank you!

## 2016-05-03 ENCOUNTER — Telehealth: Payer: Self-pay | Admitting: *Deleted

## 2016-05-03 NOTE — Telephone Encounter (Signed)
Per Dr Jaynee Eagles spoke with patient's wife, on DPR and informed her that patient's csf lab results look normal. She requested results be faxed to Dr Hulan Fess, PCP, referring physician.  Spoke with Dr Eddie Dibbles office; faxed lab results.

## 2016-05-11 DIAGNOSIS — N401 Enlarged prostate with lower urinary tract symptoms: Secondary | ICD-10-CM | POA: Diagnosis not present

## 2016-05-11 DIAGNOSIS — N403 Nodular prostate with lower urinary tract symptoms: Secondary | ICD-10-CM | POA: Diagnosis not present

## 2016-05-21 DIAGNOSIS — R351 Nocturia: Secondary | ICD-10-CM | POA: Diagnosis not present

## 2016-05-21 DIAGNOSIS — N401 Enlarged prostate with lower urinary tract symptoms: Secondary | ICD-10-CM | POA: Diagnosis not present

## 2016-05-21 DIAGNOSIS — R972 Elevated prostate specific antigen [PSA]: Secondary | ICD-10-CM | POA: Diagnosis not present

## 2016-05-29 DIAGNOSIS — H2513 Age-related nuclear cataract, bilateral: Secondary | ICD-10-CM | POA: Diagnosis not present

## 2016-05-29 DIAGNOSIS — H35411 Lattice degeneration of retina, right eye: Secondary | ICD-10-CM | POA: Diagnosis not present

## 2016-05-29 DIAGNOSIS — H43811 Vitreous degeneration, right eye: Secondary | ICD-10-CM | POA: Diagnosis not present

## 2016-07-05 DIAGNOSIS — G629 Polyneuropathy, unspecified: Secondary | ICD-10-CM | POA: Diagnosis not present

## 2016-07-05 DIAGNOSIS — Z79899 Other long term (current) drug therapy: Secondary | ICD-10-CM | POA: Diagnosis not present

## 2016-07-05 DIAGNOSIS — N401 Enlarged prostate with lower urinary tract symptoms: Secondary | ICD-10-CM | POA: Diagnosis not present

## 2016-07-05 DIAGNOSIS — Z23 Encounter for immunization: Secondary | ICD-10-CM | POA: Diagnosis not present

## 2016-07-05 DIAGNOSIS — E785 Hyperlipidemia, unspecified: Secondary | ICD-10-CM | POA: Diagnosis not present

## 2016-07-10 DIAGNOSIS — H35413 Lattice degeneration of retina, bilateral: Secondary | ICD-10-CM | POA: Diagnosis not present

## 2016-07-10 DIAGNOSIS — H25813 Combined forms of age-related cataract, bilateral: Secondary | ICD-10-CM | POA: Diagnosis not present

## 2016-07-10 DIAGNOSIS — H43811 Vitreous degeneration, right eye: Secondary | ICD-10-CM | POA: Diagnosis not present

## 2016-07-10 DIAGNOSIS — H04123 Dry eye syndrome of bilateral lacrimal glands: Secondary | ICD-10-CM | POA: Diagnosis not present

## 2016-10-16 DIAGNOSIS — L570 Actinic keratosis: Secondary | ICD-10-CM | POA: Diagnosis not present

## 2016-10-16 DIAGNOSIS — L821 Other seborrheic keratosis: Secondary | ICD-10-CM | POA: Diagnosis not present

## 2016-10-16 DIAGNOSIS — D0462 Carcinoma in situ of skin of left upper limb, including shoulder: Secondary | ICD-10-CM | POA: Diagnosis not present

## 2017-02-07 DIAGNOSIS — H2513 Age-related nuclear cataract, bilateral: Secondary | ICD-10-CM | POA: Diagnosis not present

## 2017-02-07 DIAGNOSIS — H35413 Lattice degeneration of retina, bilateral: Secondary | ICD-10-CM | POA: Diagnosis not present

## 2017-05-23 DIAGNOSIS — R972 Elevated prostate specific antigen [PSA]: Secondary | ICD-10-CM | POA: Diagnosis not present

## 2017-06-19 DIAGNOSIS — R972 Elevated prostate specific antigen [PSA]: Secondary | ICD-10-CM | POA: Diagnosis not present

## 2017-06-19 DIAGNOSIS — N5201 Erectile dysfunction due to arterial insufficiency: Secondary | ICD-10-CM | POA: Diagnosis not present

## 2017-06-19 DIAGNOSIS — N401 Enlarged prostate with lower urinary tract symptoms: Secondary | ICD-10-CM | POA: Diagnosis not present

## 2017-06-19 DIAGNOSIS — R351 Nocturia: Secondary | ICD-10-CM | POA: Diagnosis not present

## 2017-07-02 DIAGNOSIS — D1801 Hemangioma of skin and subcutaneous tissue: Secondary | ICD-10-CM | POA: Diagnosis not present

## 2017-07-02 DIAGNOSIS — L821 Other seborrheic keratosis: Secondary | ICD-10-CM | POA: Diagnosis not present

## 2017-07-02 DIAGNOSIS — D229 Melanocytic nevi, unspecified: Secondary | ICD-10-CM | POA: Diagnosis not present

## 2017-07-25 DIAGNOSIS — Z79899 Other long term (current) drug therapy: Secondary | ICD-10-CM | POA: Diagnosis not present

## 2017-07-25 DIAGNOSIS — E663 Overweight: Secondary | ICD-10-CM | POA: Diagnosis not present

## 2017-07-25 DIAGNOSIS — Z23 Encounter for immunization: Secondary | ICD-10-CM | POA: Diagnosis not present

## 2017-07-25 DIAGNOSIS — Z6827 Body mass index (BMI) 27.0-27.9, adult: Secondary | ICD-10-CM | POA: Diagnosis not present

## 2017-07-25 DIAGNOSIS — E785 Hyperlipidemia, unspecified: Secondary | ICD-10-CM | POA: Diagnosis not present

## 2017-07-25 DIAGNOSIS — N401 Enlarged prostate with lower urinary tract symptoms: Secondary | ICD-10-CM | POA: Diagnosis not present

## 2017-07-25 DIAGNOSIS — G629 Polyneuropathy, unspecified: Secondary | ICD-10-CM | POA: Diagnosis not present

## 2017-12-16 DIAGNOSIS — D229 Melanocytic nevi, unspecified: Secondary | ICD-10-CM | POA: Diagnosis not present

## 2017-12-16 DIAGNOSIS — I872 Venous insufficiency (chronic) (peripheral): Secondary | ICD-10-CM | POA: Diagnosis not present

## 2018-01-23 DIAGNOSIS — M5416 Radiculopathy, lumbar region: Secondary | ICD-10-CM | POA: Diagnosis not present

## 2018-01-23 DIAGNOSIS — G629 Polyneuropathy, unspecified: Secondary | ICD-10-CM | POA: Diagnosis not present

## 2018-01-30 ENCOUNTER — Ambulatory Visit (INDEPENDENT_AMBULATORY_CARE_PROVIDER_SITE_OTHER): Payer: Medicare Other | Admitting: Neurology

## 2018-01-30 ENCOUNTER — Encounter: Payer: Self-pay | Admitting: Neurology

## 2018-01-30 ENCOUNTER — Telehealth: Payer: Self-pay | Admitting: *Deleted

## 2018-01-30 VITALS — BP 116/71 | HR 69 | Ht 72.0 in | Wt 209.8 lb

## 2018-01-30 DIAGNOSIS — G629 Polyneuropathy, unspecified: Secondary | ICD-10-CM

## 2018-01-30 NOTE — Patient Instructions (Signed)
Charcot-Marie-Tooth Disease, Adult Charcot-Marie-Tooth disease (CMT) is a group of genetic nervous system diseases that cause gradual loss of strength and feeling in the arms and legs. The condition usually develops in childhood or early adolescence, but it may not be diagnosed until adulthood. CMT affects the nerves outside the brain and spinal cord (peripheral nerves). There are several types of CMT, depending on the type of gene defect (gene mutation) that you have. Over time, the arm and leg muscles may shrink (atrophy). Symptoms of CMT can range from mild to severe. What are the causes? This condition is a genetic condition that is usually passed down through families (inherited). CMT is caused by mutations in the genes that affect the insulation around peripheral nerves (myelin). Over time, these mutations cause the myelin to break down, leading to numbness and weakness in the feet and hands. Rarely, a gene mutation can occur without a family history (spontaneous mutation). What increases the risk? You may be at higher risk of CMT if one or both of your parents carry the gene for CMT. Some forms of CMT can develop from inheriting the gene from one parent, and other forms of CMT develop from inheriting the gene from both parents. What are the signs or symptoms? Symptoms of CMT may vary depending on the form of CMT you have. Symptoms start gradually and get worse over the course of years. Symptoms include:  Muscle weakness in the foot or lower leg.  Trouble walking.  An unusual pace and stride (gait).  Frequent falls.  Deformity of the lower legs and feet, such as high arches.  Mild to severe pain. This is not common.  As the disease progresses, it often affects other parts of the body. Later symptoms include:  Weakness in the hands.  Loss of fine motor skills (dexterity).  Loss of feelings in the hands.  Rare types of CMT can lead to:  Hearing loss.  Vision loss.  Shortness  of breath.  Vocal cord weakness.  How is this diagnosed? Your health care provider may suspect CMT based on:  Your symptoms and a physical exam.  Your medical and family history.  An exam done by a health care provider who specializes in diseases of the nervous system (neurologist).  You may also need to have tests, including:  Electromyography (EMG).  Nerve conduction studies.  Genetic testing.  A procedure to collect a sample of nerve tissue (biopsy) to examine under a microscope.  How is this treated? There is no cure for CMT. Treatment depends on the severity of your symptoms. Your team of health care providers will develop a treatment plan that is best for you. Treatment may include:  Physical therapy to: ? Strengthen muscles. ? Reduce pain through stretching. ? Increase stamina. ? Prevent falls.  Occupational therapy to help you do your normal activities.  Assistive devices, such as shoe inserts (orthotics), ankle braces, and thumb splints.  Surgery to repair deformities in your feet or joints.  Medicine to relieve pain.  Follow these instructions at home:   Learn as much as you can about your condition and work closely with your team of health care providers.  Take over-the-counter and prescription medicines only as told by your health care provider.  Keep all follow-up visits as told by your health care provider. This is important.  Do any physical or occupational therapy exercises at home as instructed.  Wear your assistive devices as needed to help with mobility and to prevent accidents and  injuries.  Consider joining a support group for CMT disease. Contact a health care provider if:  You develop new or worsening symptoms.  You develop new wounds on your feet or legs.  You need more support at home.  You are falling.  You have hearing loss. Get help right away if:  You have severe pain.  You have shortness of breath. This information is  not intended to replace advice given to you by your health care provider. Make sure you discuss any questions you have with your health care provider. Document Released: 10/05/2002 Document Revised: 05/04/2016 Document Reviewed: 02/24/2016 Elsevier Interactive Patient Education  [employer].

## 2018-01-30 NOTE — Progress Notes (Signed)
[facility]    Provider:  Dr Jaynee Eagles Referring Provider: Hulan Fess, MD Primary Care Physician:  Hulan Fess, MD  CC:  Pain in the lower legs  Interval history 01/30/2018: He feels stiff from the knees down, muscle tightness int he calfes, pain around the ankle, feels stiff, no numbness, no tingling, no burning. When laying down felt fine, no significant cramping. Dull ache in the feet. More in the ankles and the bottom of the foot, the whole length of it mostly on the inside part. Has a lot of back pain, no radicular symptoms. He saw podiatristsin the past, had shots in his toes.   HPI:  [name] is a 77 y.o. male here as a referral from Dr. Rex Kras for peripheral neuropathy. PMHx HLD, neuroma, tendinitis, erectile dysfunction, hyperlipidemia, COPD. he has already been seen by neurology. He has had a neuroma for 3-4 years. He has stiffness in the feet. He has more of a feeling of stiffness and discomfort. No tingling, no numbness, no burning, not feeling cold. The sensations start in the feet, no cramping in the feet, he has tightness in the muscles. Massaging the feet doesn't help. They don't feel tight right now. Positional. More tightness with dorsiflexion of the foot and movements of the foot. But when laying in bed or sitting it is fine. Doesn't hurt in bed with the sheets touching the toes. No significant cramping. He walks a mile a day. He has tried cymbalta, lidocaine, neurontin without relief. No sensory changes in the hands. No weakness. He has not tried stretching, he went to physical therapy but it didn;t do anything. Symptoms started 3-4 years ago and felt like socks under his feet which has now resolved. He has been to three podiatrists. One gave him shots between the toes. Inserts in the shoes helped. No other focal neurologic deficits. No inciting events to the symptoms.  Reviewed notes, labs and imaging from outside physicians, which showed:   Tyler 4+ VIEW. Personally reviewed images and agree with the following:  COMPARISON: 09/05/2014  FINDINGS: Five lumbar type vertebral bodies are well visualized. Degenerative changes are noted at L3-4, L4-5 and L5-S1. Grade 1 anterolisthesis of a degenerative nature is noted at L4-5. This has increased slightly in the interval from the prior exam. No other focal abnormality is noted.  IMPRESSION: Degenerative changes. No definitive acute abnormality is noted.  Reviewed records from [facility]. Patient reported pain in both knees downwards the entire foot bilaterally consonant present for years but worse recently. He is seen by neurologist in some notes present on the chart. Apparently he had an EMG and nerve conduction study showing moderate peripheral neuropathy, apparently blood work was negative for metabolic causes but the details of those labs are not in the notes I am reviewing.. Patient tried topical lidocaine, nortriptyline, gabapentin 600 mg 3 times a day without relief. He also had an L4-L5 disc herniation treated with epidural shots. He has not tried Cymbalta. He did not want to go back to his previous neurology doctor and was referred to [facility]. Exam showed normal foot exam without swelling or erythema. Arterial pulses 2+ symmetric. Subjective pain in the knees downward to the feet. Preserve sharp and dull discrimination. 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 He is taking B12.   Reviewed MRI of the lumbar spine report: Images are not available for me to review. MRI of the lumbar spine was significant for mild disc and  facet degeneration without stenosis at L2-L3 and L3-L4, L4-L5 he had diffuse disc bulging compressing the left L4 nerve root under the pedicle with moderate to severe facet hypertrophy and moderate spinal stenosis. At L5-S1 he had moderate disc degeneration with disc space narrowing and mild spurring no  significant spinal stenosis.  From [facility] neurological showed polyneuropathy, acute carpal tunnel syndrome of the left wrist, EMG showed peripheral polyneuropathy moderate (I do not have the report are dated to review), recommended conservative treatment for carpal tunnel syndrome, clinically followed lumbosacral radiculopathy.  Review of Systems: Patient complains of symptoms per HPI as well as the following symptoms: Hearing loss, ringing in ears, snoring, joint pain, allergies, impotence. Pertinent negatives per HPI. All others negative.   Social History   Socioeconomic History  . Marital status: Married    Spouse name: Not on file  . Number of children: 3  . Years of education: 33  . Highest education level: Some college, no degree  Occupational History  . Occupation: sales-Retired  . Occupation: 3 children  Social Needs  . Financial resource strain: Not on file  . Food insecurity:    Worry: Not on file    Inability: Not on file  . Transportation needs:    Medical: Not on file    Non-medical: Not on file  Tobacco Use  . Smoking status: Former Smoker    Packs/day: 1.00    Years: 47.00    Pack years: 47.00    Types: Cigarettes    Last attempt to quit: 10/29/2000    Years since quitting: 17.2  . Smokeless tobacco: Never Used  Substance and Sexual Activity  . Alcohol use: Yes    Alcohol/week: 0.6 oz    Types: 1 Standard drinks or equivalent per week    Comment: socially- nightly 1 cocktail  . Drug use: No  . Sexual activity: Not on file  Lifestyle  . Physical activity:    Days per week: Not on file    Minutes per session: Not on file  . Stress: Not on file  Relationships  . Social connections:    Talks on phone: Not on file    Gets together: Not on file    Attends religious service: Not on file    Active member of club or organization: Not on file    Attends meetings of clubs or organizations: Not on file    Relationship status: Not on file  . Intimate partner  violence:    Fear of current or ex partner: Not on file    Emotionally abused: Not on file    Physically abused: Not on file    Forced sexual activity: Not on file  Other Topics Concern  . Not on file  Social History Narrative   Lives at home with wife   Caffeine use: decaf   Right handed    Family History  Problem Relation Age of Onset  . Pancreatic cancer Father   . Stroke Mother   . Aneurysm Brother   . Neuropathy Neg Hx     Past Medical History:  Diagnosis Date  . Colon polyp   . Diverticulosis    extensive 3'15.  . Dysrhythmia    hx. Ablation for tachycardia, no routine cardiology visits now  . ED (erectile dysfunction)   . Heart murmur    teenager  . History of colon polyps   . History of kidney stones    x1 passed  . Hyperlipidemia   . 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 Osteoarthritis   . Palpitation    with svt rate 150  . Tendinitis    achiles heel spur  . Testicular atrophy   . Tubular adenoma of colon     Past Surgical History:  Procedure Laterality Date  . ABLATION  2010   cardiac   . COLONOSCOPY    . EUS N/A 11/17/2014   Procedure: ESOPHAGEAL ENDOSCOPIC ULTRASOUND (EUS) RADIAL;  Surgeon: [name], MD;  Location: [facility];  Service: Endoscopy;  Laterality: N/A;  . FINE NEEDLE ASPIRATION N/A 11/17/2014   Procedure: FINE NEEDLE ASPIRATION (FNA) LINEAR;  Surgeon: [name], MD;  Location: [facility];  Service: Endoscopy;  Laterality: N/A;  + or- fna  . FLEXIBLE SIGMOIDOSCOPY    . HEMORRHOID SURGERY     banding   . HERNIA REPAIR Bilateral    '05 bilateral hernia  . KNEE ARTHROSCOPY Left    scope '08  . KNEE ARTHROSCOPY    . LAPAROSCOPIC APPENDECTOMY N/A 09/05/2014   Procedure: APPENDECTOMY LAPAROSCOPIC;  Surgeon: [name], MD;  Location: [place];  Service: General;  Laterality: N/A;  . ROTATOR CUFF REPAIR Right 2016    Current Outpatient Medications  Medication Sig Dispense Refill  . aspirin 81 MG EC tablet  Take 81 mg by mouth daily.      Marland Kitchen atorvastatin (LIPITOR) 10 MG tablet Take 10 mg by mouth daily.     Marland Kitchen b complex vitamins tablet Take 1 tablet by mouth daily.    . Calcium Carbonate-Vitamin D (CALCIUM 600+D) 600-400 MG-UNIT per tablet Take 1 tablet by mouth daily.      Marland Kitchen doxazosin (CARDURA) 8 MG tablet Take 8 mg by mouth daily.    . DULoxetine (CYMBALTA) 30 MG capsule Take 30 mg by mouth daily.  12  . finasteride (PROSCAR) 5 MG tablet Take 5 mg by mouth daily.   2  . GLUCOSAMINE-CHONDROITIN-VIT C PO Take 1 tablet by mouth daily.      . Multiple Vitamin (MULTIVITAMIN) tablet Take 1 tablet by mouth daily.      . predniSONE (DELTASONE) 10 MG tablet Take 10 mg by mouth daily with breakfast. Taper pack, now taking 40 mg daily x 3 days followed by 20 mg x 4 days then STOP.    Marland Kitchen psyllium (METAMUCIL SMOOTH TEXTURE) 28 % packet Take 1 packet by mouth daily.      No current facility-administered medications for this visit.     Allergies as of 01/30/2018 - Review Complete 01/30/2018  Allergen Reaction Noted  . Pollen extract  01/30/2018    Vitals: BP 116/71 (BP Location: Right Arm, Patient Position: Sitting)   Pulse 69   Ht 6' (1.829 m)   Wt 209 lb 12.8 oz (95.2 kg)   BMI 28.45 kg/m  Last Weight:  Wt Readings from Last 1 Encounters:  01/30/18 209 lb 12.8 oz (95.2 kg)   Last Height:   Ht Readings from Last 1 Encounters:  01/30/18 6' (1.829 m)   Physical exam: Exam: Gen: NAD, conversant, well nourised, obese, well groomed                     CV: RRR, no MRG. No Carotid Bruits. No peripheral edema, warm, nontender Eyes: Conjunctivae clear without exudates or hemorrhage  Neuro: Detailed Neurologic Exam  Speech:    Speech is normal; fluent and spontaneous with normal comprehension.  Cognition:    The patient is oriented to person, place,  and time;     recent and remote memory intact;     language fluent;     normal attention, concentration,     fund of knowledge Cranial Nerves:     The pupils are equal, round, and reactive to light. The fundi are normal and spontaneous venous pulsations are present. Visual fields are full to finger confrontation. Extraocular movements are intact. Trigeminal sensation is intact and the muscles of mastication are normal. The face is symmetric. The palate elevates in the midline. Hearing intact. Voice is normal. Shoulder shrug is normal. The tongue has normal motion without fasciculations.   Coordination:    Normal finger to nose and heel to shin. Normal rapid alternating movements.   Gait:    Heel-toe gait are normal. Significant imbalance on heel to toe. Romberg negative.   Motor Observation:    No asymmetry, no atrophy, and no involuntary movements noted. Tone:    Normal muscle tone.    Posture:    Posture is normal. normal erect    Strength:    Strength is V/V in the upper and lower limbs.      Sensation: Decreased sensation right lateral leg otherwise intact pinprick distally in the feet and hands, he denies any loss of pin prick distally.  Distal decrease in temperature in the lower extremities to the mid calf.     Loss of vibration at thegreat toes, mcp and medial malleoli. Can feel it in the knees.  Reflex Exam:  DTR's: Absent lowers, trace uppers.  Toes:    The toes are downgoing bilaterally.   Clonus:    Clonus is absent.       Assessment/Plan:  77 year old male here for second opinion of peripheral polyneuropathy. He was diagnosed by an outside neurologist. Neurologic exam significant for decreased temperature distally and absent vibration distally in the lower extremities. Absent lower extremity deep tendon reflexes.  Patient's symptoms do not sound like they are from peripheral polyneuropathy, he denies any sensory changes, the symptoms are more muscle tightness which are positional.Extensive neuropathy serum evaluation and LP was normal,   Repeat Emg/ncs of the bilateral lowers + one arm and radial nerve and H  waves and F waves  Formal genetic testing   cc: Dr. Rex Kras  Orders Placed This Encounter  Procedures  . NCV with EMG(electromyography)     Sarina Ill, MD  [facility] [address] [place], [place] [address]  Phone [phone] Fax [phone]  A total of 45 minutes was spent face-to-face with this patient and his wife. Over half this time was spent on counseling patient on the neropathy diagnosis and different diagnostic and therapeutic options, risk factor reduction and education.

## 2018-01-30 NOTE — Telephone Encounter (Signed)
Alnylam Act blood drawn by lab, order form signed and completed and packaged per [facility] instructions in [employer] bag. Scheduled delivery via telephone with [employer]ex and received confirmation code of [id number] Tracking #: [id number]

## 2018-02-03 DIAGNOSIS — M533 Sacrococcygeal disorders, not elsewhere classified: Secondary | ICD-10-CM | POA: Diagnosis not present

## 2018-02-12 NOTE — Telephone Encounter (Signed)
[facility] Testing Results are complete. The result is NEGATIVE. No Pathogenic sequence variants or deletions/duplications identified.   Results ready for Dr. Cathren Laine review. Copy sent to medical records.

## 2018-02-13 DIAGNOSIS — H43811 Vitreous degeneration, right eye: Secondary | ICD-10-CM | POA: Diagnosis not present

## 2018-02-13 DIAGNOSIS — H2513 Age-related nuclear cataract, bilateral: Secondary | ICD-10-CM | POA: Diagnosis not present

## 2018-02-18 DIAGNOSIS — M533 Sacrococcygeal disorders, not elsewhere classified: Secondary | ICD-10-CM | POA: Diagnosis not present

## 2018-03-10 DIAGNOSIS — M533 Sacrococcygeal disorders, not elsewhere classified: Secondary | ICD-10-CM | POA: Diagnosis not present

## 2018-03-13 ENCOUNTER — Ambulatory Visit (INDEPENDENT_AMBULATORY_CARE_PROVIDER_SITE_OTHER): Payer: Medicare Other | Admitting: Neurology

## 2018-03-13 DIAGNOSIS — G609 Hereditary and idiopathic neuropathy, unspecified: Secondary | ICD-10-CM

## 2018-03-13 DIAGNOSIS — S86819S Strain of other muscle(s) and tendon(s) at lower leg level, unspecified leg, sequela: Secondary | ICD-10-CM

## 2018-03-13 DIAGNOSIS — G629 Polyneuropathy, unspecified: Secondary | ICD-10-CM | POA: Diagnosis not present

## 2018-03-13 DIAGNOSIS — Z0289 Encounter for other administrative examinations: Secondary | ICD-10-CM

## 2018-03-13 NOTE — Progress Notes (Signed)
77 year old male here for second opinion of peripheral polyneuropathy. He was diagnosed by an outside neurologist. Neurologic exam significant for decreased temperature distally and absent vibration distally in the lower extremities. Absent lower extremity deep tendon reflexes.  Patient's symptoms do not sound like they are from peripheral polyneuropathy -  even though he appears to have peripheral neuropathy it sounds asymptomatic; he denies any sensory changes, the symptoms are more muscle tightness which are positional. Extensive neuropathy serum evaluation and LP was normal. Reviewed current workup with patient, discussed possibilities, reviewed neuropathy and the causes of neuropathy and provided a book from one of my colleagues for him to read "Peripheral Neuropathy: What It Is and What You Can Do to Feel Better ([place])" and pointed out important sections.  Also completed an 80-gene panel for hereditary causes including Amyloidosis which was negative.  Recommend dry needling on the tight calf muscles to see if thsi will help with his muscular pain. Risk factor for peripheral neuropathy is prediabetes. Discussed with patient. May consider daily alpha lipoic acid which is an antioxidant that may reduce free radical oxidative stress associated with diabetic and pre-diabetic polyneuropathy, existing evidence suggests that alpha lipoic acid significantly reduces stabbing, lancinating and burning pain and diabetic neuropathy with its onset of action as early as 1-2 weeks. 600mg  daily to start  A total of 15 minutes was spent face-to-face with this patient. Over half this time was spent on counseling patient on the idiopathic neuropathy diagnosis and different diagnostic and therapeutic options, risk factor reduction and education.  This does not include any time spent on emg/ncs procedure.  Dry needling calf muscles: Margarita Grizzle at [address] [place]:  Orders Placed This Encounter   Procedures  . Ambulatory referral to Physical Therapy

## 2018-03-13 NOTE — Patient Instructions (Addendum)
-  May consider daily alpha lipoic acid which is an antioxidant that may reduce free radical oxidative stress associated with diabetic and pre-diabetic polyneuropathy, existing evidence suggests that alpha lipoic acid significantly reduces stabbing, lancinating and burning pain and diabetic neuropathy with its onset of action as early as 1-2 weeks. 600mg  daily to start

## 2018-03-17 ENCOUNTER — Encounter (INDEPENDENT_AMBULATORY_CARE_PROVIDER_SITE_OTHER): Payer: Self-pay | Admitting: Orthopaedic Surgery

## 2018-03-17 ENCOUNTER — Ambulatory Visit (INDEPENDENT_AMBULATORY_CARE_PROVIDER_SITE_OTHER): Payer: Medicare Other

## 2018-03-17 ENCOUNTER — Ambulatory Visit (INDEPENDENT_AMBULATORY_CARE_PROVIDER_SITE_OTHER): Payer: Medicare Other | Admitting: Orthopaedic Surgery

## 2018-03-17 DIAGNOSIS — G8929 Other chronic pain: Secondary | ICD-10-CM

## 2018-03-17 DIAGNOSIS — M1712 Unilateral primary osteoarthritis, left knee: Secondary | ICD-10-CM

## 2018-03-17 DIAGNOSIS — M25562 Pain in left knee: Secondary | ICD-10-CM

## 2018-03-17 MED ORDER — METHYLPREDNISOLONE ACETATE 40 MG/ML IJ SUSP
40.0000 mg | INTRAMUSCULAR | Status: AC | PRN
  Administered 2018-03-17: 40 mg via INTRA_ARTICULAR

## 2018-03-17 MED ORDER — LIDOCAINE HCL 1 % IJ SOLN
3.0000 mL | INTRAMUSCULAR | Status: AC | PRN
  Administered 2018-03-17: 3 mL

## 2018-03-17 NOTE — Progress Notes (Signed)
Full Name: [name] Gender: Male MRN #: [id number] Date of Birth: 1941/07/10    Visit Date: 03/13/2018 09:09 Age: 77 Years 14 Months Old Examining Physician: Sarina Ill, MD  Referring Physician: Dr. Rex Kras  History: 77 year old male here for second opinion of peripheral polyneuropathy. He was diagnosed by an outside neurologist. Neurologic exam significant for decreased temperature distally and absent vibration distally in the lower extremities. Absent lower extremity deep tendon reflexes.  Summary:   The left median APB motor nerve showed prolonged distal onset latency (4.5 ms, N<4.4) and reduced amplitude(2.6 mV, N<4) The left  Peroneal motor nerve showed prolonged distal onset latency (7.6 ms, N<6.5) and reduced amplitude(0.57mV, N>2) The right Peroneal motor nerve showed prolonged distal onset latency (6.44ms, N<6.5) and reduced amplitude(1.4mV, N>2) The left  Tibial motor nerve showed prolonged distal onset latency (5.1 ms, N<5.8) and reduced amplitude(3.17mV, N>4) The rightTibial motor nerve showed prolonged distal onset latency (4.5 ms, N<5.8) and reduced amplitude(2.4 mV, N>4) The left radial sensory nerve showed prolonged distal peak latency (3.72ms, N<2.9) and reduced amplitude(9 uV, N>15) The left Sural, right Sural, left Superficial Peroneal, right Superficial peroneal sensory nerves showed no response. The left Median 2nd Digit orthodromic sensory nerve showed prolonged distal peak latency (3.4 ms, N<3.4) and reduced amplitude(4 uV, N>10) The left  Ulnar 5th Digit orthodromic sensory nerve showed prolonged distal peak latency (3.2 ms, N<3.1) and reduced amplitude(3 uV, N>5)  F Wave studies indicate that the left Tibial F wave has delayed latency(62.7, N<77ms). F Wave studies indicate that the right Tibial F wave has delayed latency(62, N<8ms). H Reflex studies indicate that the left has delayed latency(47.2, N<66ms) and the right has delayed latency(47.4,  N<28ms).  All remaining nerves (as detailed in below tables) within normal limits All muscles (as detailed in below tables) within normal limits    Conclusion: This is an abnormal study. 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     Sarina Ill, MD  [facility] [address] [place], [place] [address]  Phone [phone] Fax [phone]        Conclusion:  Cc: Dr. Rex Kras    ------------------------------- Physician Name, M.D.  [facility] [place], [place] [address] Tel: [phone] Fax: [phone]        [facility]    Nerve / Sites Muscle Latency Ref. Amplitude Ref. Rel Amp Segments Distance Velocity Ref. Area    ms ms mV mV %  cm m/s m/s mVms  L Median - APB     Wrist APB 4.5 ?4.4 2.6 ?4.0 100 Wrist - APB 7   9.7     Upper arm APB 8.6  6.1  232 Upper arm - Wrist 24 58 ?49 25.0  L Ulnar - ADM     Wrist ADM 3.3 ?3.3 8.1 ?6.0 100 Wrist - ADM 7   30.5     B.Elbow ADM 8.0  6.9  84.6 B.Elbow - Wrist 23 49 ?49 27.8     A.Elbow ADM 10.0  6.1  88.6 A.Elbow - B.Elbow 10 51 ?49 26.1         A.Elbow - Wrist      L Peroneal - EDB     Ankle EDB 7.6 ?6.5 0.9 ?2.0 100 Ankle - EDB 9   1.9     Fib head EDB 15.4  0.9  93.7 Fib head - Ankle 31 40 ?44 2.6     Pop fossa  EDB 18.0  0.8  93.3 Pop fossa - Fib head 10 39 ?44 1.5         Pop fossa - Ankle      R Peroneal - EDB     Ankle EDB 6.5 ?6.5 1.5 ?2.0 100 Ankle - EDB 9   3.6     Fib head EDB 14.7  0.8  51.2 Fib head - Ankle 31 38 ?44 4.6     Pop fossa EDB 17.2  0.9  112 Pop fossa - Fib head 10 40 ?44 5.0         Pop fossa - Ankle      L Tibial - AH     Ankle AH 5.1 ?5.8 3.7 ?4.0 100 Ankle - AH 9   9.6     Pop fossa AH 15.1  2.0  54.6 Pop fossa - Ankle 39 39 ?41 8.6  R Tibial - AH     Ankle AH 4.5 ?5.8 2.4 ?4.0 100 Ankle - AH 9   8.4     Pop fossa AH 15.3  2.1  87.3 Pop fossa - Ankle 39 36  ?41 7.3  L Median, Ulnar - Martin-Gruber Anastomosis     Ulnar Wrist ADM 3.4  8.3  100     30.2     Ulnar Elbow ADM 8.2  6.8  82.9     28.7     Median Wrist ADM 4.1  0.3  4.11          Median Elbow ADM 9.3  0.3  92.9          Ulnar Wrist FDI 4.7  5.7  2195     15.1     Ulnar Elbow FDI 9.1  12.1  212     35.7     Median Wrist FDI 11.8              Median Elbow FDI 9.4  0.9       2.9  L Radial - EIP     Forearm EIP 2.9 ?2.9 4.9 ?2.0 100 Forearm - EIP 4  ?49 24.1     Elbow EIP 5.9  5.5  114 Elbow - Forearm 16 53  23.9     Spiral Gr EIP 8.1  4.6  83.2 Spiral Gr - Elbow 12 55  20.1                     SNC    Nerve / Sites Rec. Site Peak Lat Ref.  Amp Ref. Segments Distance    ms ms V V  cm  L Radial - Anatomical snuff box (Forearm)     Forearm Wrist 3.0 ?2.9 9 ?15 Forearm - Wrist 10  L Sural - Ankle (Calf)     Calf Ankle NR ?4.4 NR ?6 Calf - Ankle 14  R Sural - Ankle (Calf)     Calf Ankle NR ?4.4 NR ?6 Calf - Ankle 14  L Superficial peroneal - Ankle     Lat leg Ankle NR ?4.4 NR ?6 Lat leg - Ankle 14  R Superficial peroneal - Ankle     Lat leg Ankle NR ?4.4 NR ?6 Lat leg - Ankle 14  L Median - Orthodromic (Dig II, Mid palm)     Dig II Wrist 3.4 ?3.4 4 ?10 Dig II - Wrist 13  L Ulnar - Orthodromic, (Dig V, Mid palm)     Dig V Wrist 3.2 ?3.1 3 ?5 Dig  V - Wrist 11                   F  Wave    Nerve F Lat Ref.   ms ms  L Tibial - AH 62.7 ?56.0  R Tibial - AH 62.0 ?56.0         H Reflex    Nerve H Lat Lat Hmax   ms ms   Left Right Ref. Left Right Ref.  Tibial - Soleus 47.2 47.4 ?35.0 47.2  ?35.0         EMG full       EMG Summary Table    Spontaneous MUAP Recruitment  Muscle IA Fib PSW Fasc Other Amp Dur. Poly Pattern  L. Vastus medialis Normal None None None _______ Normal Normal Normal Normal  L. Tibialis anterior Normal None None None _______ Normal Normal Normal Normal  L. Gastrocnemius (Medial head) Normal None None None _______ Normal Normal Normal Normal  L.  Extensor hallucis longus Normal None None None _______ Normal Normal Normal Normal  L. Abductor digiti minimi (pedis) Normal None None None _______ Normal Normal Normal Normal

## 2018-03-17 NOTE — Progress Notes (Signed)
See procedure note.

## 2018-03-17 NOTE — Progress Notes (Signed)
Office Visit Note   Patient: [name]           Date of Birth: 10-26-1941           MRN: [id number] Visit Date: 03/17/2018              Requested by: Hulan Fess, MD [place], [place] [address] PCP: Hulan Fess, MD   Assessment & Plan: Visit Diagnoses:  1. Chronic pain of left knee   2. Unilateral primary osteoarthritis, left knee     Plan: He is a perfect candidate to try steroid injection today and then follow this up in a month with hyaluronic acid.  I told him about walking on flat surfaces and work on quad strengthening exercises.  He agrees with this treatment plan.  He is not a diabetic.  He tolerated steroid injection well.  We will see him back in a month for hyaluronic acid injection in his left knee to treat moderate arthritic pain.  Follow-Up Instructions: Return in about 1 month (around 04/14/2018).   Orders:  Orders Placed This Encounter  Procedures  . Large Joint Inj  . XR Knee 1-2 Views Left   No orders of the defined types were placed in this encounter.     Procedures: Large Joint Inj: L knee on 03/17/2018 10:16 AM Indications: diagnostic evaluation and pain Details: 22 G 1.5 in needle, superolateral approach  Arthrogram: No  Medications: 3 mL lidocaine 1 %; 40 mg methylPREDNISolone acetate 40 MG/ML Outcome: tolerated well, no immediate complications Procedure, treatment alternatives, risks and benefits explained, specific risks discussed. Consent was given by the patient. Immediately prior to procedure a time out was called to verify the correct patient, procedure, equipment, support staff and site/side marked as required. Patient was prepped and draped in the usual sterile fashion.       Clinical Data: No additional findings.   Subjective: Chief Complaint  Patient presents with  . Left Knee - Pain  The patient is coming to be seen today for his left knee.  It was actually 10 years ago we performed a left knee  arthroscopy.  He had a partial medial meniscectomy.  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  He still denies any locking catching it mainly hurts with weightbearing activities and is been on his feet all day long.  He says it does swell on occasion of both knees can get stiff.  He currently denies any acute medical issues.  HPI  Review of Systems   Objective: Vital Signs: There were no vitals taken for this visit.  Physical Exam He is alert and oriented no acute distress Ortho Exam Examination of both knees show slight varus malalignment there is just a slight effusion of the left knee compared to left and right knee.  Is mainly medial joint line tenderness.  Range of motion is full. Specialty Comments:  No specialty comments available.  Imaging: Xr Knee 1-2 Views Left  Result Date: 03/17/2018 2 views of left knee show moderate arthritic changes with medial joint space narrowing and slight varus malalignment.  Her significant    PMFS History: Patient Active Problem List   Diagnosis Date Noted  . Unilateral primary osteoarthritis, left knee 03/17/2018  . Mass of stomach 11/02/2014  . 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 Solitary pulmonary nodule 10/26/2014  . Appendicitis 09/05/2014  . Palpitations 02/15/2011  . SUPRAVENTRICULAR TACHYCARDIA 01/11/2011  Past Medical History:  Diagnosis Date  . Colon polyp   . Diverticulosis    extensive 3'15.  . Dysrhythmia    hx. Ablation for tachycardia, no routine cardiology visits now  . ED (erectile dysfunction)   . Heart murmur    teenager  . History of colon polyps   . History of kidney stones    x1 passed  . Hyperlipidemia   . Neuromuscular disorder ([place])    "neuroma"   . Osteoarthritis   . Palpitation    with svt rate 150  . Tendinitis    achiles heel spur  . Testicular atrophy   . Tubular adenoma of colon     Family History  Problem Relation Age of Onset  .  Pancreatic cancer Father   . Stroke Mother   . Aneurysm Brother   . Neuropathy Neg Hx     Past Surgical History:  Procedure Laterality Date  . ABLATION  2010   cardiac   . COLONOSCOPY    . EUS N/A 11/17/2014   Procedure: ESOPHAGEAL ENDOSCOPIC ULTRASOUND (EUS) RADIAL;  Surgeon: [name], MD;  Location: [facility];  Service: Endoscopy;  Laterality: N/A;  . FINE NEEDLE ASPIRATION N/A 11/17/2014   Procedure: FINE NEEDLE ASPIRATION (FNA) LINEAR;  Surgeon: [name], MD;  Location: [facility];  Service: Endoscopy;  Laterality: N/A;  + or- fna  . FLEXIBLE SIGMOIDOSCOPY    . HEMORRHOID SURGERY     banding   . HERNIA REPAIR Bilateral    '05 bilateral hernia  . KNEE ARTHROSCOPY Left    scope '08  . KNEE ARTHROSCOPY    . LAPAROSCOPIC APPENDECTOMY N/A 09/05/2014   Procedure: APPENDECTOMY LAPAROSCOPIC;  Surgeon: [name], MD;  Location: [facility];  Service: General;  Laterality: N/A;  . ROTATOR CUFF REPAIR Right 2016   Social History   Occupational History  . Occupation: sales-Retired  . Occupation: 3 children  Tobacco Use  . Smoking status: Former Smoker    Packs/day: 1.00    Years: 47.00    Pack years: 47.00    Types: Cigarettes    Last attempt to quit: 10/29/2000    Years since quitting: 17.3  . Smokeless tobacco: Never Used  Substance and Sexual Activity  . Alcohol use: Yes    Alcohol/week: 0.6 oz    Types: 1 Standard drinks or equivalent per week    Comment: socially- nightly 1 cocktail  . Drug use: No  . Sexual activity: Not on file

## 2018-03-18 DIAGNOSIS — G609 Hereditary and idiopathic neuropathy, unspecified: Secondary | ICD-10-CM | POA: Insufficient documentation

## 2018-03-18 NOTE — Procedures (Signed)
Full Name: [name] Gender: Male MRN #: [id number] Date of Birth: [birth year]    Visit Date: 03/13/2018 09:09 Age: 77 Years 59 Months Old Examining Physician: Sarina Ill, MD  Referring Physician: Dr. Rex Kras  History: 77 year old male here for second opinion of peripheral polyneuropathy. He was diagnosed by an outside neurologist. Neurologic exam significant for decreased temperature distally and absent vibration distally in the lower extremities. Absent lower extremity deep tendon reflexes.  Summary:   The left median APB motor nerve showed prolonged distal onset latency (4.5 ms, N<4.4) and reduced amplitude(2.6 mV, N<4) The left  Peroneal motor nerve showed prolonged distal onset latency (7.6 ms, N<6.5) and reduced amplitude(0.75mV, N>2) The right Peroneal motor nerve showed prolonged distal onset latency (6.67ms, N<6.5) and reduced amplitude(1.48mV, N>2) The left  Tibial motor nerve showed prolonged distal onset latency (5.1 ms, N<5.8) and reduced amplitude(3.15mV, N>4) The rightTibial motor nerve showed prolonged distal onset latency (4.5 ms, N<5.8) and reduced amplitude(2.4 mV, N>4) The left radial sensory nerve showed prolonged distal peak latency (3.57ms, N<2.9) and reduced amplitude(9 uV, N>15) The left Sural, right Sural, left Superficial Peroneal, right Superficial peroneal sensory nerves showed no response. The left Median 2nd Digit orthodromic sensory nerve showed prolonged distal peak latency (3.4 ms, N<3.4) and reduced amplitude(4 uV, N>10) The left  Ulnar 5th Digit orthodromic sensory nerve showed prolonged distal peak latency (3.2 ms, N<3.1) and reduced amplitude(3 uV, N>5)  F Wave studies indicate that the left Tibial F wave has delayed latency(62.7, N<51ms). F Wave studies indicate that the right Tibial F wave has delayed latency(62, N<33ms). H Reflex studies indicate that the left has delayed latency(47.2, N<50ms) and the right has delayed latency(47.4,  N<71ms).  All remaining nerves (as detailed in below tables) within normal limits All muscles (as detailed in below tables) within normal limits    Conclusion: This is an abnormal study. 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     Sarina Ill, MD  [facility] [address] [place],  [address]  Phone [phone] Fax [phone]        Conclusion:  Cc: Dr. Rex Kras    ------------------------------- Physician Name, M.D.  [facility] [place],  [address] Tel: [phone] Fax: [phone]        [facility]    Nerve / Sites Muscle Latency Ref. Amplitude Ref. Rel Amp Segments Distance Velocity Ref. Area    ms ms mV mV %  cm m/s m/s mVms  L Median - APB     Wrist APB 4.5 ?4.4 2.6 ?4.0 100 Wrist - APB 7   9.7     Upper arm APB 8.6  6.1  232 Upper arm - Wrist 24 58 ?49 25.0  L Ulnar - ADM     Wrist ADM 3.3 ?3.3 8.1 ?6.0 100 Wrist - ADM 7   30.5     B.Elbow ADM 8.0  6.9  84.6 B.Elbow - Wrist 23 49 ?49 27.8     A.Elbow ADM 10.0  6.1  88.6 A.Elbow - B.Elbow 10 51 ?49 26.1         A.Elbow - Wrist      L Peroneal - EDB     Ankle EDB 7.6 ?6.5 0.9 ?2.0 100 Ankle - EDB 9   1.9     Fib head EDB 15.4  0.9  93.7 Fib head - Ankle 31 40 ?44 2.6     Pop fossa  EDB 18.0  0.8  93.3 Pop fossa - Fib head 10 39 ?44 1.5         Pop fossa - Ankle      R Peroneal - EDB     Ankle EDB 6.5 ?6.5 1.5 ?2.0 100 Ankle - EDB 9   3.6     Fib head EDB 14.7  0.8  51.2 Fib head - Ankle 31 38 ?44 4.6     Pop fossa EDB 17.2  0.9  112 Pop fossa - Fib head 10 40 ?44 5.0         Pop fossa - Ankle      L Tibial - AH     Ankle AH 5.1 ?5.8 3.7 ?4.0 100 Ankle - AH 9   9.6     Pop fossa AH 15.1  2.0  54.6 Pop fossa - Ankle 39 39 ?41 8.6  R Tibial - AH     Ankle AH 4.5 ?5.8 2.4 ?4.0 100 Ankle - AH 9   8.4     Pop fossa AH 15.3  2.1  87.3 Pop fossa - Ankle 39 36  ?41 7.3  L Median, Ulnar - Martin-Gruber Anastomosis     Ulnar Wrist ADM 3.4  8.3  100     30.2     Ulnar Elbow ADM 8.2  6.8  82.9     28.7     Median Wrist ADM 4.1  0.3  4.11          Median Elbow ADM 9.3  0.3  92.9          Ulnar Wrist FDI 4.7  5.7  2195     15.1     Ulnar Elbow FDI 9.1  12.1  212     35.7     Median Wrist FDI 11.8              Median Elbow FDI 9.4  0.9       2.9  L Radial - EIP     Forearm EIP 2.9 ?2.9 4.9 ?2.0 100 Forearm - EIP 4  ?49 24.1     Elbow EIP 5.9  5.5  114 Elbow - Forearm 16 53  23.9     Spiral Gr EIP 8.1  4.6  83.2 Spiral Gr - Elbow 12 55  20.1                     SNC    Nerve / Sites Rec. Site Peak Lat Ref.  Amp Ref. Segments Distance    ms ms V V  cm  L Radial - Anatomical snuff box (Forearm)     Forearm Wrist 3.0 ?2.9 9 ?15 Forearm - Wrist 10  L Sural - Ankle (Calf)     Calf Ankle NR ?4.4 NR ?6 Calf - Ankle 14  R Sural - Ankle (Calf)     Calf Ankle NR ?4.4 NR ?6 Calf - Ankle 14  L Superficial peroneal - Ankle     Lat leg Ankle NR ?4.4 NR ?6 Lat leg - Ankle 14  R Superficial peroneal - Ankle     Lat leg Ankle NR ?4.4 NR ?6 Lat leg - Ankle 14  L Median - Orthodromic (Dig II, Mid palm)     Dig II Wrist 3.4 ?3.4 4 ?10 Dig II - Wrist 13  L Ulnar - Orthodromic, (Dig V, Mid palm)     Dig V Wrist 3.2 ?3.1 3 ?5 Dig  V - Wrist 11                   F  Wave    Nerve F Lat Ref.   ms ms  L Tibial - AH 62.7 ?56.0  R Tibial - AH 62.0 ?56.0         H Reflex    Nerve H Lat Lat Hmax   ms ms   Left Right Ref. Left Right Ref.  Tibial - Soleus 47.2 47.4 ?35.0 47.2  ?35.0         EMG full       EMG Summary Table    Spontaneous MUAP Recruitment  Muscle IA Fib PSW Fasc Other Amp Dur. Poly Pattern  L. Vastus medialis Normal None None None _______ Normal Normal Normal Normal  L. Tibialis anterior Normal None None None _______ Normal Normal Normal Normal  L. Gastrocnemius (Medial head) Normal None None None _______ Normal Normal Normal Normal  L.  Extensor hallucis longus Normal None None None _______ Normal Normal Normal Normal  L. Abductor digiti minimi (pedis) Normal None None None _______ Normal Normal Normal Normal

## 2018-03-19 ENCOUNTER — Telehealth (INDEPENDENT_AMBULATORY_CARE_PROVIDER_SITE_OTHER): Payer: Self-pay

## 2018-03-19 NOTE — Telephone Encounter (Signed)
Submitted application online for SynviscOne injection, left knee.

## 2018-03-20 NOTE — Telephone Encounter (Signed)
Spoke with patient and confirmed that he is ok with neuropathy genetic testing results being mailed to his home (per Dr. Jaynee Eagles instructions) which were negative. He verbalized understanding and confirmed his address.   Report placed in envelope and addressed to pt. Sent to medical records for outgoing mail.

## 2018-04-10 ENCOUNTER — Ambulatory Visit: Payer: Medicare Other | Attending: Neurology | Admitting: Physical Therapy

## 2018-04-10 DIAGNOSIS — M6281 Muscle weakness (generalized): Secondary | ICD-10-CM | POA: Diagnosis not present

## 2018-04-10 DIAGNOSIS — R252 Cramp and spasm: Secondary | ICD-10-CM

## 2018-04-10 DIAGNOSIS — M79661 Pain in right lower leg: Secondary | ICD-10-CM | POA: Diagnosis not present

## 2018-04-10 DIAGNOSIS — R262 Difficulty in walking, not elsewhere classified: Secondary | ICD-10-CM | POA: Insufficient documentation

## 2018-04-10 DIAGNOSIS — M25672 Stiffness of left ankle, not elsewhere classified: Secondary | ICD-10-CM | POA: Insufficient documentation

## 2018-04-10 DIAGNOSIS — M79662 Pain in left lower leg: Secondary | ICD-10-CM

## 2018-04-10 DIAGNOSIS — M25671 Stiffness of right ankle, not elsewhere classified: Secondary | ICD-10-CM | POA: Diagnosis not present

## 2018-04-10 NOTE — Therapy (Signed)
[place], [place], [address] Phone: [phone]   Fax:  [phone]  Physical Therapy Evaluation  Patient Details  Name: [name] MRN: [id number] Date of Birth: 06-May-1941 Referring Provider: Sarina Ill MD   Encounter Date: 04/10/2018  PT End of Session - 04/10/18 1234    Visit Number  1    Number of Visits  13    Date for PT Re-Evaluation  05/22/18    Authorization Type  Medicare    PT Start Time  0930    PT Stop Time  1025    PT Time Calculation (min)  55 min    Activity Tolerance  Patient tolerated treatment well    Behavior During Therapy  [facility] for tasks assessed/performed       Past Medical History:  Diagnosis Date  . Colon polyp   . Diverticulosis    extensive 3'15.  . Dysrhythmia    hx. Ablation for tachycardia, no routine cardiology visits now  . ED (erectile dysfunction)   . Heart murmur    teenager  . History of colon polyps   . History of kidney stones    x1 passed  . Hyperlipidemia   . Neuromuscular disorder ([place])    "neuroma"   . Osteoarthritis   . Palpitation    with svt rate 150  . Tendinitis    achiles heel spur  . Testicular atrophy   . Tubular adenoma of colon     Past Surgical History:  Procedure Laterality Date  . ABLATION  2010   cardiac   . COLONOSCOPY    . EUS N/A 11/17/2014   Procedure: ESOPHAGEAL ENDOSCOPIC ULTRASOUND (EUS) RADIAL;  Surgeon: [name], MD;  Location: [facility];  Service: Endoscopy;  Laterality: N/A;  . FINE NEEDLE ASPIRATION N/A 11/17/2014   Procedure: FINE NEEDLE ASPIRATION (FNA) LINEAR;  Surgeon: [name], MD;  Location: [facility];  Service: Endoscopy;  Laterality: N/A;  + or- fna  . FLEXIBLE SIGMOIDOSCOPY    . HEMORRHOID SURGERY     banding   . HERNIA REPAIR Bilateral    '05 bilateral hernia  . KNEE ARTHROSCOPY Left    scope '08  . KNEE ARTHROSCOPY    . LAPAROSCOPIC APPENDECTOMY N/A 09/05/2014   Procedure:  APPENDECTOMY LAPAROSCOPIC;  Surgeon: [name], MD;  Location: [facility];  Service: General;  Laterality: N/A;  . ROTATOR CUFF REPAIR Right 2016    There were no vitals filed for this visit.   Subjective Assessment - 04/10/18 0938    Subjective  I have had a lot of stiffness in both legs for the last 6 months or so.  I had an injection in left knee 2 weeks ago with Dr Ninfa Linden.  Dr Jaynee Eagles sent me here for ther ex and TPDN.  I used to walk a mile a day but now I cant because of knee pain and I get tired easily.  I am hoping to get better    Limitations  Standing;Walking;House hold activities    How long can you sit comfortably?  unlimited    How long can you stand comfortably?  15 minutes and low back bothers me    How long can you walk comfortably?  10 minutes tightness and tired in legs    Diagnostic tests  x ray of knee left  arthroscopy 10 years ago    Patient Stated Goals  Stop discomfort in knees and lower legs.  and walk normally again  Currently in Pain?  Yes    Pain Score  8     Pain Location  Leg    Pain Orientation  Right;Left    Pain Descriptors / Indicators  Sharp;Aching    Pain Type  Chronic pain;Neuropathic pain    Pain Onset  More than a month ago 6 months    Pain Frequency  Constant    Aggravating Factors   standing and walking for a long time, sometimes sleeping standing in one place    Pain Relieving Factors  nothing  aspercreme minimal         [facility] PT Assessment - 04/10/18 0944      Assessment   Medical Diagnosis  bil lower leg pain and stiffness    Referring Provider  Sarina Ill MD    Hand Dominance  Right ambidextrous    Prior Therapy  none      Precautions   Precautions  None      Restrictions   Weight Bearing Restrictions  No      Balance Screen   Has the patient fallen in the past 6 months  No    Has the patient had a decrease in activity level because of a fear of falling?   No    Is the patient reluctant to leave their home because of a  fear of falling?   No      Home Environment   Living Environment  Private residence    Living Arrangements  Spouse/significant other    Type of [place] to enter    Entrance Stairs-Number of Steps  13    Entrance Stairs-Rails  Right going up    [place]      Prior Function   Level of [place]   Overall Cognitive Status  Within Functional Limits for tasks assessed      Observation/Other Assessments   Focus on Therapeutic Outcomes (FOTO)   FOTO intake 32%, limitation 68% predicted 47%      Sensation   Light Touch  Appears Intact      Functional Tests   Functional tests  Squat;Sit to Stand;Single leg stance      Squat   Comments  wt shifts to right LE and hp flex 45 degrees only      Single Leg Stance   Comments  right 8 sec, left 6 sec      Sit to Stand   Comments  10 x in 30 seconds      Posture/Postural Control   Posture/Postural Control  Postural limitations    Postural Limitations  Forward head;Rounded Shoulders;Flexed trunk    Posture Comments  valgus knees      ROM / Strength   AROM / PROM / Strength  AROM;Strength      AROM   Overall AROM   Deficits    Right Hip Flexion  110    Right Hip External Rotation   42    Right Hip Internal Rotation   15    Left Hip Flexion  110    Left Hip External Rotation   20    Left Hip Internal Rotation   25    Right Knee Extension  0    Right Knee Flexion  130    Left Knee Extension  0    Left Knee Flexion  134    Right Ankle Dorsiflexion  5  Right Ankle Plantar Flexion  50    Left Ankle Dorsiflexion  2    Left Ankle Plantar Flexion  42      Strength   Overall Strength  Deficits    Overall Strength Comments  Pt can only heel raise 6 x without pain bil    Right Hip Flexion  4-/5    Right Hip Extension  4-/5    Right Hip ABduction  4-/5    Left Hip Flexion  4-/5    Left Hip Extension  4-/5    Left Hip ABduction  4-/5    Right Knee Flexion   4/5    Right Knee Extension  4/5    Left Knee Flexion  4/5    Left Knee Extension  4-/5    Right Ankle Dorsiflexion  3-/5    Right Ankle Plantar Flexion  4-/5    Left Ankle Dorsiflexion  3-/5    Left Ankle Plantar Flexion  4-/5      Palpation   Palpation comment  bil tenderness on gastrocs laterally more tight than medial gastroc.       Ambulation/Gait   Gait Pattern  Decreased stride length    Ambulation Surface  Level    Gait velocity  2.23 ft/sec                 Objective measurements completed on examination: See above findings.      [facility] Adult PT Treatment/Exercise - 04/10/18 0944      Manual Therapy   Manual Therapy  Soft tissue mobilization    Manual therapy comments  skilled palpation with TPDN      Ankle Exercises: Stretches   Gastroc Stretch  3 reps;30 seconds right and left    Gastroc Stretch Limitations  with towel       Trigger Point Dry Needling - 04/10/18 1012    Consent Given?  Yes    Education Handout Provided  Yes    Muscles Treated Lower Body  Gastrocnemius left lateral    Gastrocnemius Response  Twitch response elicited;Palpable increased muscle length           PT Education - 04/10/18 1233    Education Details  POC Explanation on findings Education on TPDN and towel DF HEP     Person(s) Educated  Patient    Methods  Explanation;Demonstration;Tactile cues;Verbal cues;Handout    Comprehension  Verbalized understanding;Returned demonstration       PT Short Term Goals - 04/10/18 1016      PT SHORT TERM GOAL #1   Title  STG=LTG        PT Long Term Goals - 04/10/18 1016      PT LONG TERM GOAL #1   Title  Pt will be independent with advanced HEP    Baseline  no knowledge of HEP    Time  6    Period  Weeks    Status  New    Target Date  05/22/18      PT LONG TERM GOAL #2   Title  Pt will be able to stand and walk for 30 minutes with pain level 3/10 or less in bil legs to incorporate walking for exercise in HEP     Baseline  can only stand for 10 minutes and 8/10 pain    Time  6    Period  Weeks    Status  New    Target Date  05/22/18      PT LONG  TERM GOAL #3   Title  Pt will be able to heel raise for 20 times to be able to negotiate steps with 75 % greater ease     Baseline  Pt can only tolerate 6 x and pain at 6-8/10    Time  6    Period  Weeks    Status  New    Target Date  05/22/18      PT LONG TERM GOAL #4   Title  "FOTO will improve from  68% limitaion    to 47% limitation    indicating improved functional mobility .     Baseline  FOTO limitaion at eval 68%    Time  6    Period  Weeks    Status  New    Target Date  05/22/18      PT LONG TERM GOAL #5   Title  Pt will increase DF bil to at least 10 degrees to improve gait and increase stride length for more normalized gait function    Baseline  Pt  right 5 degrees DF and 2 on left    Time  6    Period  Weeks    Status  New      PT LONG TERM GOAL #6   Title  "Pt will improve bil  knee extensor/flexor strength to >/= 4+/5 with </= 3/10 pain to promote safety with walking/standing activities    Baseline  knee and DF/PF at 3-/5 or 4-/5 at evaluation. SEe flow sheet    Time  6    Period  Weeks    Status  New    Target Date  05/22/18             Plan - 04/10/18 0947    Clinical Impression Statement  [name] has been having increased tightness and pain in bil LE/ankle pain and stiffness. PT with decreased ankle DF right 5 degrees and left 2 degrees. Pt is stiffness dominant and would improve with flexibility as well as strengthening ex.  Pt reports idiopathic neuropathy, and knee stiffness with an injection in left knee in the past 2 weeks. Pt feels like he has tried everything and would like to try TPDN for bil calf stiffness.  Pt has impairments in bil ankle AROM, muscle weakness, pain and difficulty walking and would benefit from skilled  PT to maximize function. Pt would benefit from skilled PT to address impairments 2 x  week  for 6 weeks.  Pt consented to TPDN today for left gastroc and was closely monitored throughout session.     History and Personal Factors relevant to plan of care:  appendix. idiopathic  Neuropathy, left arthroscopy left knee . osteoarthritis see history     Clinical Presentation  Evolving    Clinical Decision Making  Moderate    Rehab Potential  Excellent    PT Frequency  2x / week    PT Duration  6 weeks    PT Treatment/Interventions  ADLs/Self Care Home Management;Cryotherapy;Electrical Stimulation;Iontophoresis 4mg /ml Dexamethasone;Moist Heat;Ultrasound;Therapeutic exercise;Therapeutic activities;Functional mobility training;Stair training;Gait training;Neuromuscular re-education;Patient/family education;Manual techniques;Passive range of motion;Dry needling;Taping    PT Next Visit Plan  needs basic HEP for ankle, knee  ROM/strength assess TPDN    PT Home Exercise Plan  towel DF gastroc stretch    Consulted and Agree with Plan of Care  Patient       Patient will benefit from skilled therapeutic intervention in order to improve the following deficits and impairments:  Abnormal gait, Pain, Increased fascial restricitons, Increased muscle spasms, Impaired flexibility, Difficulty walking, Decreased strength, Decreased range of motion, Decreased mobility, Decreased activity tolerance  Visit Diagnosis: Pain in left lower leg  Pain in right lower leg  Difficulty in walking, not elsewhere classified  Muscle weakness (generalized)  Stiffness of right ankle, not elsewhere classified  Stiffness of left ankle, not elsewhere classified  Cramp and spasm     Problem List Patient Active Problem List   Diagnosis Date Noted  . 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 Unilateral primary osteoarthritis, left knee 03/17/2018  . Mass of stomach 11/02/2014  . 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 Solitary pulmonary nodule 10/26/2014  . Appendicitis 09/05/2014  . Palpitations 02/15/2011  .  SUPRAVENTRICULAR TACHYCARDIA 01/11/2011   Voncille Lo, PT Certified Exercise Expert for the Aging Adult  04/10/18 1:03 PM Phone: [phone] Fax: [place] [facility] [address] [place], [place], [address] Phone: [phone]   Fax:  [phone]  Name: [name] MRN: [id number] Date of Birth: 1941-01-06

## 2018-04-10 NOTE — Patient Instructions (Signed)
Trigger Point Dry Needling  . What is Trigger Point Dry Needling (DN)? o DN is a physical therapy technique used to treat muscle pain and dysfunction. Specifically, DN helps deactivate muscle trigger points (muscle knots).  o A thin filiform needle is used to penetrate the skin and stimulate the underlying trigger point. The goal is for a local twitch response (LTR) to occur and for the trigger point to relax. No medication of any kind is injected during the procedure.   . What Does Trigger Point Dry Needling Feel Like?  o The procedure feels different for each individual patient. Some patients report that they do not actually feel the needle enter the skin and overall the process is not painful. Very mild bleeding may occur. However, many patients feel a deep cramping in the muscle in which the needle was inserted. This is the local twitch response.   Marland Kitchen How Will I feel after the treatment? o Soreness is normal, and the onset of soreness may not occur for a few hours. Typically this soreness does not last longer than two days.  o Bruising is uncommon, however; ice can be used to decrease any possible bruising.  o In rare cases feeling tired or nauseous after the treatment is normal. In addition, your symptoms may get worse before they get better, this period will typically not last longer than 24 hours.   . What Can I do After My Treatment? o Increase your hydration by drinking more water for the next 24 hours. o You may place ice or heat on the areas treated that have become sore, however, do not use heat on inflamed or bruised areas. Heat often brings more relief post needling. o You can continue your regular activities, but vigorous activity is not recommended initially after the treatment for 24 hours. o DN is best combined with other physical therapy such as strengthening, stretching, and other therapies.   Gastroc / Heel Cord Stretch - Seated With Towel   Sit on floor, towel around ball  of foot. Gently pull foot in toward body, stretching heel cord and calf. Hold for _30__ seconds. Repeat on involved leg. Repeat _3__ times both legs. Do _3__ times per day.   Voncille Lo, PT Certified Exercise Expert for the Aging Adult  04/10/18 10:02 AM Phone: [phone] Fax: [phone]

## 2018-04-14 ENCOUNTER — Telehealth (INDEPENDENT_AMBULATORY_CARE_PROVIDER_SITE_OTHER): Payer: Self-pay

## 2018-04-14 NOTE — Telephone Encounter (Signed)
Patient approved for SynviscOne injection, left knee. Buy & Bill No PA required.

## 2018-04-17 ENCOUNTER — Encounter

## 2018-04-21 ENCOUNTER — Encounter (INDEPENDENT_AMBULATORY_CARE_PROVIDER_SITE_OTHER): Payer: Self-pay | Admitting: Orthopaedic Surgery

## 2018-04-21 ENCOUNTER — Ambulatory Visit (INDEPENDENT_AMBULATORY_CARE_PROVIDER_SITE_OTHER): Payer: Medicare Other | Admitting: Orthopaedic Surgery

## 2018-04-21 DIAGNOSIS — M1712 Unilateral primary osteoarthritis, left knee: Secondary | ICD-10-CM

## 2018-04-21 MED ORDER — HYLAN G-F 20 48 MG/6ML IX SOSY
48.0000 mg | PREFILLED_SYRINGE | INTRA_ARTICULAR | Status: AC | PRN
  Administered 2018-04-21: 48 mg via INTRA_ARTICULAR

## 2018-04-21 NOTE — Progress Notes (Signed)
   Procedure Note  Patient: [name]             Date of Birth: 1940-12-14           MRN: [id number]             Visit Date: 04/21/2018  Procedures: Visit Diagnoses: Unilateral primary osteoarthritis, left knee  Large Joint Inj: L knee on 04/21/2018 9:49 AM Indications: pain and diagnostic evaluation Details: 22 G 1.5 in needle, superolateral approach  Arthrogram: No  Medications: 48 mg Hylan 48 MG/6ML Outcome: tolerated well, no immediate complications Procedure, treatment alternatives, risks and benefits explained, specific risks discussed. Consent was given by the patient. Immediately prior to procedure a time out was called to verify the correct patient, procedure, equipment, support staff and site/side marked as required. Patient was prepped and draped in the usual sterile fashion.    The patient is here today scheduled for a hyaluronic acid injection in his left knee to treat osteoarthritis pain.  He is [name] had steroid injection as well.  Things to avoid knee replacement surgery.  He is 77 years old.  On exam he does have moderate varus deformity of his left knee with good range of motion but patellofemoral crepitation and joint line tenderness.  He tolerated the Synvisc injection well.  All questions were encouraged and concerns were answered and addressed.

## 2018-04-22 ENCOUNTER — Ambulatory Visit: Payer: Medicare Other | Admitting: Physical Therapy

## 2018-04-22 ENCOUNTER — Other Ambulatory Visit: Payer: Self-pay

## 2018-04-22 ENCOUNTER — Encounter: Payer: Self-pay | Admitting: Physical Therapy

## 2018-04-22 DIAGNOSIS — R252 Cramp and spasm: Secondary | ICD-10-CM

## 2018-04-22 DIAGNOSIS — M6281 Muscle weakness (generalized): Secondary | ICD-10-CM | POA: Diagnosis not present

## 2018-04-22 DIAGNOSIS — M25672 Stiffness of left ankle, not elsewhere classified: Secondary | ICD-10-CM | POA: Diagnosis not present

## 2018-04-22 DIAGNOSIS — R262 Difficulty in walking, not elsewhere classified: Secondary | ICD-10-CM | POA: Diagnosis not present

## 2018-04-22 DIAGNOSIS — M79661 Pain in right lower leg: Secondary | ICD-10-CM | POA: Diagnosis not present

## 2018-04-22 DIAGNOSIS — M79662 Pain in left lower leg: Secondary | ICD-10-CM

## 2018-04-22 DIAGNOSIS — M25671 Stiffness of right ankle, not elsewhere classified: Secondary | ICD-10-CM | POA: Diagnosis not present

## 2018-04-22 NOTE — Therapy (Signed)
[place], [place], [address] Phone: [phone]   Fax:  [phone]  Physical Therapy Treatment  Patient Details  Name: [name] MRN: [id number] Date of Birth: [birth year] Referring Provider: Sarina Ill MD   Encounter Date: 04/22/2018  PT End of Session - 04/22/18 0842    Visit Number  2    Number of Visits  13    Date for PT Re-Evaluation  05/22/18    Authorization Type  Medicare    PT Start Time  0842    PT Stop Time  0937    PT Time Calculation (min)  55 min    Activity Tolerance  Patient tolerated treatment well    Behavior During Therapy  [facility] for tasks assessed/performed       Past Medical History:  Diagnosis Date  . Colon polyp   . Diverticulosis    extensive 3'15.  . Dysrhythmia    hx. Ablation for tachycardia, no routine cardiology visits now  . ED (erectile dysfunction)   . Heart murmur    teenager  . History of colon polyps   . History of kidney stones    x1 passed  . Hyperlipidemia   . Neuromuscular disorder ([place])    "neuroma"   . Osteoarthritis   . Palpitation    with svt rate 150  . Tendinitis    achiles heel spur  . Testicular atrophy   . Tubular adenoma of colon     Past Surgical History:  Procedure Laterality Date  . ABLATION  2010   cardiac   . COLONOSCOPY    . EUS N/A 11/17/2014   Procedure: ESOPHAGEAL ENDOSCOPIC ULTRASOUND (EUS) RADIAL;  Surgeon: [name], MD;  Location: [facility];  Service: Endoscopy;  Laterality: N/A;  . FINE NEEDLE ASPIRATION N/A 11/17/2014   Procedure: FINE NEEDLE ASPIRATION (FNA) LINEAR;  Surgeon: [name], MD;  Location: [facility];  Service: Endoscopy;  Laterality: N/A;  + or- fna  . FLEXIBLE SIGMOIDOSCOPY    . HEMORRHOID SURGERY     banding   . HERNIA REPAIR Bilateral    '05 bilateral hernia  . KNEE ARTHROSCOPY Left    scope '08  . KNEE ARTHROSCOPY    . LAPAROSCOPIC APPENDECTOMY N/A 09/05/2014   Procedure:  APPENDECTOMY LAPAROSCOPIC;  Surgeon: [name], MD;  Location: [facility];  Service: General;  Laterality: N/A;  . ROTATOR CUFF REPAIR Right 2016    There were no vitals filed for this visit.  Subjective Assessment - 04/22/18 0846    Subjective  I got an injection by Dr. Ninfa Linden in left knee yesterday.  I am hoping to make it feel better.  I felt ok with the TPDN.     Limitations  Standing;Walking;House hold activities    Patient Stated Goals  Stop discomfort in knees and lower legs.  and walk normally again    Currently in Pain?  Yes    Pain Score  7     Pain Location  Leg    Pain Orientation  Right;Left    Pain Descriptors / Indicators  Sharp;Aching    Pain Type  Chronic pain;Neuropathic pain    Pain Onset  More than a month ago    Pain Frequency  Constant                       [facility] Adult PT Treatment/Exercise - 04/22/18 0001      Knee/Hip Exercises: Stretches   Active  Hamstring Stretch  3 reps;30 seconds    Active Hamstring Stretch Limitations  seated with strap       Knee/Hip Exercises: Aerobic   Nustep  level 3 -5 minutes      Knee/Hip Exercises: Standing   Lateral Step Up  2 sets;Hand Hold: 2;10 reps    Forward Step Up  2 sets;Hand Hold: 2;10 reps      Knee/Hip Exercises: Seated   Long Arc Quad  2 sets;15 reps;Right;Left    Sit to [employer]  15 reps;with UE support      Knee/Hip Exercises: Supine   Quad Sets  Strengthening;20 reps;2 sets    Quad Sets Limitations  with towel at ankle      Modalities   Modalities  Moist Heat      Moist Heat Therapy   Number Minutes Moist Heat  15 Minutes    Moist Heat Location  Knee gastroc hamstrings      Manual Therapy   Manual Therapy  Soft tissue mobilization    Manual therapy comments  skilled palpation with TPDN    Soft tissue mobilization  IASTYM bilaterally to bil gastrocs/soleus, hamstrings      Ankle Exercises: Standing   Heel Raises  10 reps;Both;3 seconds x 2      Ankle Exercises: Stretches    Soleus Stretch  2 reps;30 seconds    Gastroc Stretch  3 reps;30 seconds right and left    Gastroc Stretch Limitations  also gastroc stretch on step bil and single limb moving stretch x 10       Trigger Point Dry Needling - 04/22/18 0917    Consent Given?  Yes    Education Handout Provided  No    Muscles Treated Lower Body  Gastrocnemius;Soleus;Hamstring bil all TPDN    Hamstring Response  Twitch response elicited;Palpable increased muscle length    Gastrocnemius Response  Twitch response elicited;Palpable increased muscle length    Soleus Response  Twitch response elicited;Palpable increased muscle length           PT Education - 04/22/18 0916    Education Details  added HEP for flexibility     Person(s) Educated  Patient    Methods  Explanation;Demonstration;Tactile cues;Verbal cues;Handout    Comprehension  Verbalized understanding;Returned demonstration       PT Short Term Goals - 04/10/18 1016      PT SHORT TERM GOAL #1   Title  STG=LTG        PT Long Term Goals - 04/10/18 1016      PT LONG TERM GOAL #1   Title  Pt will be independent with advanced HEP    Baseline  no knowledge of HEP    Time  6    Period  Weeks    Status  New    Target Date  05/22/18      PT LONG TERM GOAL #2   Title  Pt will be able to stand and walk for 30 minutes with pain level 3/10 or less in bil legs to incorporate walking for exercise in HEP    Baseline  can only stand for 10 minutes and 8/10 pain    Time  6    Period  Weeks    Status  New    Target Date  05/22/18      PT LONG TERM GOAL #3   Title  Pt will be able to heel raise for 20 times to be able to negotiate steps with 75 %  greater ease     Baseline  Pt can only tolerate 6 x and pain at 6-8/10    Time  6    Period  Weeks    Status  New    Target Date  05/22/18      PT LONG TERM GOAL #4   Title  "FOTO will improve from  68% limitaion    to 47% limitation    indicating improved functional mobility .     Baseline  FOTO  limitaion at eval 68%    Time  6    Period  Weeks    Status  New    Target Date  05/22/18      PT LONG TERM GOAL #5   Title  Pt will increase DF bil to at least 10 degrees to improve gait and increase stride length for more normalized gait function    Baseline  Pt  right 5 degrees DF and 2 on left    Time  6    Period  Weeks    Status  New      PT LONG TERM GOAL #6   Title  "Pt will improve bil  knee extensor/flexor strength to >/= 4+/5 with </= 3/10 pain to promote safety with walking/standing activities    Baseline  knee and DF/PF at 3-/5 or 4-/5 at evaluation. SEe flow sheet    Time  6    Period  Weeks    Status  New    Target Date  05/22/18            Plan - 04/22/18 1732    Clinical Impression Statement  Pt entered clinic reporting good result in left knee injection but still with pain and neuropathy.  Pt is looking forward to more exercises and TPDN to make calf/hamstring muscles less tight.  Pt consented to TPDN and was closely monitored throughout session.. will continue to progress toward goal completion as able    Rehab Potential  Excellent    PT Frequency  2x / week    PT Duration  6 weeks    PT Treatment/Interventions  ADLs/Self Care Home Management;Cryotherapy;Electrical Stimulation;Iontophoresis 4mg /ml Dexamethasone;Moist Heat;Ultrasound;Therapeutic exercise;Therapeutic activities;Functional mobility training;Stair training;Gait training;Neuromuscular re-education;Patient/family education;Manual techniques;Passive range of motion;Dry needling;Taping    PT Next Visit Plan  needs basic HEP for ankle, knee  ROM/strength assess TPDN goals next visit quad strength and progressing gastroc stretch    PT Home Exercise Plan  towel DF gastroc stretch quad set, gastroc stretch on level and step and sitting hamstring stretch    Consulted and Agree with Plan of Care  Patient       Patient will benefit from skilled therapeutic intervention in order to improve the following  deficits and impairments:  Abnormal gait, Pain, Increased fascial restricitons, Increased muscle spasms, Impaired flexibility, Difficulty walking, Decreased strength, Decreased range of motion, Decreased mobility, Decreased activity tolerance  Visit Diagnosis: Pain in left lower leg  Pain in right lower leg  Difficulty in walking, not elsewhere classified  Muscle weakness (generalized)  Stiffness of right ankle, not elsewhere classified  Stiffness of left ankle, not elsewhere classified  Cramp and spasm     Problem List Patient Active Problem List   Diagnosis Date Noted  . 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 Unilateral primary osteoarthritis, left knee 03/17/2018  . Mass of stomach 11/02/2014  . 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 Solitary pulmonary nodule 10/26/2014  . Appendicitis 09/05/2014  . Palpitations 02/15/2011  . SUPRAVENTRICULAR TACHYCARDIA  01/11/2011   Voncille Lo, PT Certified Exercise Expert for the Aging Adult  04/22/18 5:44 PM Phone: [phone] Fax: [facility] [address] [place], [place], [address] Phone: [phone]   Fax:  [phone]  Name: [name] MRN: [id number] Date of Birth: [birth year]

## 2018-04-22 NOTE — Patient Instructions (Signed)
         Voncille Lo, PT Certified Exercise Expert for the Aging Adult  04/22/18 9:16 AM Phone: [phone] Fax: [phone]

## 2018-05-06 ENCOUNTER — Other Ambulatory Visit: Payer: Self-pay

## 2018-05-06 ENCOUNTER — Encounter: Payer: Self-pay | Admitting: Physical Therapy

## 2018-05-06 ENCOUNTER — Ambulatory Visit: Payer: Medicare Other | Attending: Neurology | Admitting: Physical Therapy

## 2018-05-06 DIAGNOSIS — M79662 Pain in left lower leg: Secondary | ICD-10-CM | POA: Diagnosis not present

## 2018-05-06 DIAGNOSIS — R262 Difficulty in walking, not elsewhere classified: Secondary | ICD-10-CM

## 2018-05-06 DIAGNOSIS — M25671 Stiffness of right ankle, not elsewhere classified: Secondary | ICD-10-CM | POA: Insufficient documentation

## 2018-05-06 DIAGNOSIS — M79661 Pain in right lower leg: Secondary | ICD-10-CM | POA: Insufficient documentation

## 2018-05-06 DIAGNOSIS — M25672 Stiffness of left ankle, not elsewhere classified: Secondary | ICD-10-CM | POA: Diagnosis not present

## 2018-05-06 DIAGNOSIS — M6281 Muscle weakness (generalized): Secondary | ICD-10-CM

## 2018-05-06 DIAGNOSIS — R252 Cramp and spasm: Secondary | ICD-10-CM | POA: Diagnosis not present

## 2018-05-06 NOTE — Patient Instructions (Signed)
Straight Leg Raise    Tighten stomach and slowly raise locked right leg __6__ inches from floor. Repeat __10__ times per set. Do __3__ sets per session. Do __1__ sessions per day.  Important to bend the knee to protect your back. Do not arch back      http://orth.exer.us/1102   Copyright  VHI. All rights reserved.  Voncille Lo, PT Certified Exercise Expert for the Aging Adult  05/06/18 10:04 AM Phone: [phone] Fax: [phone]

## 2018-05-06 NOTE — Therapy (Signed)
[place], [place], [address] Phone: [phone]   Fax:  [phone]  Physical Therapy Treatment  Patient Details  Name: [name] MRN: [id number] Date of Birth: 1940-11-19 Referring Provider: Sarina Ill MD   Encounter Date: 05/06/2018  PT End of Session - 05/06/18 1125    Visit Number  3    Number of Visits  13    Date for PT Re-Evaluation  05/22/18    Authorization Type  Medicare    PT Start Time  0931    PT Stop Time  1024    PT Time Calculation (min)  53 min    Activity Tolerance  Patient tolerated treatment well    Behavior During Therapy  [facility] for tasks assessed/performed       Past Medical History:  Diagnosis Date  . Colon polyp   . Diverticulosis    extensive 3'15.  . Dysrhythmia    hx. Ablation for tachycardia, no routine cardiology visits now  . ED (erectile dysfunction)   . Heart murmur    teenager  . History of colon polyps   . History of kidney stones    x1 passed  . Hyperlipidemia   . Neuromuscular disorder ([place])    "neuroma"   . Osteoarthritis   . Palpitation    with svt rate 150  . Tendinitis    achiles heel spur  . Testicular atrophy   . Tubular adenoma of colon     Past Surgical History:  Procedure Laterality Date  . ABLATION  2010   cardiac   . COLONOSCOPY    . EUS N/A 11/17/2014   Procedure: ESOPHAGEAL ENDOSCOPIC ULTRASOUND (EUS) RADIAL;  Surgeon: [name], MD;  Location: [facility];  Service: Endoscopy;  Laterality: N/A;  . FINE NEEDLE ASPIRATION N/A 11/17/2014   Procedure: FINE NEEDLE ASPIRATION (FNA) LINEAR;  Surgeon: [name], MD;  Location: [facility];  Service: Endoscopy;  Laterality: N/A;  + or- fna  . FLEXIBLE SIGMOIDOSCOPY    . HEMORRHOID SURGERY     banding   . HERNIA REPAIR Bilateral    '05 bilateral hernia  . KNEE ARTHROSCOPY Left    scope '08  . KNEE ARTHROSCOPY    . LAPAROSCOPIC APPENDECTOMY N/A 09/05/2014   Procedure:  APPENDECTOMY LAPAROSCOPIC;  Surgeon: [name], MD;  Location: [facility];  Service: General;  Laterality: N/A;  . ROTATOR CUFF REPAIR Right 2016    There were no vitals filed for this visit.  Subjective Assessment - 05/06/18 0940    Subjective  I stopped doing a lot of the exercise because they hurt my knee except the towel stretch   I lost 10 lb  but put it back on    Limitations  Standing;Walking;House hold activities    How long can you sit comfortably?  unlimited    How long can you stand comfortably?  10  minutes then back hurts    How long can you walk comfortably?  10 minutes tightness and tired in legs    Diagnostic tests  x ray of knee left  arthroscopy 10 years ago    Patient Stated Goals  Stop discomfort in knees and lower legs.  and walk normally again    Currently in Pain?  Yes    Pain Score  6     Pain Location  Leg    Pain Orientation  Right;Left    Pain Descriptors / Indicators  Aching;Dull    Pain Type  Chronic pain;Neuropathic pain    Pain Onset  More than a month ago    Pain Frequency  Constant                       [facility] Adult PT Treatment/Exercise - 05/06/18 0935      Self-Care   Self-Care  Other Self-Care Comments    Other Self-Care Comments   discussed importance of flexibility and strengthening for overall improvement of pain and function      Knee/Hip Exercises: Stretches   Active Hamstring Stretch  3 reps;30 seconds    Active Hamstring Stretch Limitations  seated with strap  constant verbal cuing adn TC    Piriformis Stretch  3 reps;30 seconds;Left    Other Knee/Hip Stretches  guided with hamstring stretch and IT band stretch on left with manual cuies to pt.        Knee/Hip Exercises: Aerobic   Nustep  level 4 -5 minutes      Knee/Hip Exercises: Seated   Sit to Sand  1 set;10 reps minimal UE support      Knee/Hip Exercises: Supine   Quad Sets  Strengthening;1 set;5 reps pt does not like pain sensationin left knee DCed exercise fo     Straight Leg Raises  3 sets;15 reps      Knee/Hip Exercises: Sidelying   Clams  10 x 2 right and left without Theraband VC for abdominal set before clamshel      Moist Heat Therapy   Number Minutes Moist Heat  15 Minutes    Moist Heat Location  Knee gastroc hamstrings  provided extra towel for hot  hot packts      Manual Therapy   Manual Therapy  Soft tissue mobilization    Soft tissue mobilization  IASTYM bilaterally to bil gastrocs/soleus, hamstrings             PT Education - 05/06/18 1016    Education Details  modified HEP  Added SLR and clams and reviewed sitting hamstring stretch.     Person(s) Educated  Patient    Methods  Explanation;Demonstration;Tactile cues;Verbal cues    Comprehension  Verbalized understanding;Returned demonstration       PT Short Term Goals - 04/10/18 1016      PT SHORT TERM GOAL #1   Title  STG=LTG        PT Long Term Goals - 05/06/18 1125      PT LONG TERM GOAL #1   Title  Pt will be independent with advanced HEP    Baseline  Pt given intial HEP    Time  6    Period  Weeks    Status  On-going      PT LONG TERM GOAL #2   Title  Pt will be able to stand and walk for 30 minutes with pain level 3/10 or less in bil legs to incorporate walking for exercise in HEP    Baseline  can only stand for 10 minutes and 6/10 pain today    Time  6    Period  Weeks    Status  On-going      PT LONG TERM GOAL #3   Title  Pt will be able to heel raise for 20 times to be able to negotiate steps with 75 % greater ease     Baseline  Pt worked on stretching today     Time  6    Period  Weeks  Status  Unable to assess      PT LONG TERM GOAL #4   Title  "FOTO will improve from  68% limitaion    to 47% limitation    indicating improved functional mobility .     Time  6    Period  Weeks    Status  Unable to assess      PT LONG TERM GOAL #5   Title  Pt will increase DF bil to at least 10 degrees to improve gait and increase stride length for  more normalized gait function    Baseline  Pt  right 5 degrees DF and 2 on left at eval/ discomfort at left knee with injection and pt tentative about any stretching today    Time  6    Period  Weeks    Status  On-going      PT LONG TERM GOAL #6   Title  "Pt will improve bil  knee extensor/flexor strength to >/= 4+/5 with </= 3/10 pain to promote safety with walking/standing activities    Time  6    Period  Weeks    Status  Unable to assess            Plan - 05/06/18 1135    Clinical Impression Statement  Pt entered clinic and declined TPDN due to going to work tomorrow.  Pt reports not doing flexibiity exercises due to pain and concern about irritating left knee.  Pt explained importance of gentle knee stretching to increase full functionof knee.  Pt has tightness on entire posterior chain and needs the flexibilty as well as the strength.  Pt added SLR and hip abductor strength to HEP today.  Will continue toward progressing goals.  No goals acheieved today due to no exercises being done at home due to fear of irritating knee.  Addressed fear avoidance behaviors today and will progress as pt tolerates.    Rehab Potential  Excellent    PT Frequency  2x / week    PT Duration  6 weeks    PT Treatment/Interventions  ADLs/Self Care Home Management;Cryotherapy;Electrical Stimulation;Iontophoresis 4mg /ml Dexamethasone;Moist Heat;Ultrasound;Therapeutic exercise;Therapeutic activities;Functional mobility training;Stair training;Gait training;Neuromuscular re-education;Patient/family education;Manual techniques;Passive range of motion;Dry needling;Taping    PT Next Visit Plan  needs basic HEP for ankle, knee  ROM/strength.  Assess goals next visit     PT Home Exercise Plan  towel DF gastroc stretch quad set, gastroc stretch on level and step and sitting hamstring stretch SLR and sidelying clams    Consulted and Agree with Plan of Care  Patient       Patient will benefit from skilled therapeutic  intervention in order to improve the following deficits and impairments:     Visit Diagnosis: Pain in left lower leg  Pain in right lower leg  Difficulty in walking, not elsewhere classified  Muscle weakness (generalized)  Stiffness of right ankle, not elsewhere classified  Stiffness of left ankle, not elsewhere classified  Cramp and spasm     Problem List Patient Active Problem List   Diagnosis Date Noted  . 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 Unilateral primary osteoarthritis, left knee 03/17/2018  . Mass of stomach 11/02/2014  . 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 Solitary pulmonary nodule 10/26/2014  . Appendicitis 09/05/2014  . Palpitations 02/15/2011  . SUPRAVENTRICULAR TACHYCARDIA 01/11/2011    Voncille Lo, PT Certified Exercise Expert for the Aging Adult  05/06/18 11:39 AM Phone: [phone] Fax: [facility] [address]  [place], [place], [address] Phone: [phone]   Fax:  [phone]  Name: [name] MRN: [id number] Date of Birth: 06-11-1941

## 2018-05-08 ENCOUNTER — Ambulatory Visit: Payer: Medicare Other | Admitting: Physical Therapy

## 2018-05-13 ENCOUNTER — Encounter: Payer: Medicare Other | Admitting: Physical Therapy

## 2018-05-15 ENCOUNTER — Ambulatory Visit: Payer: Medicare Other | Admitting: Physical Therapy

## 2018-05-15 ENCOUNTER — Encounter: Payer: Self-pay | Admitting: Physical Therapy

## 2018-05-15 DIAGNOSIS — M79661 Pain in right lower leg: Secondary | ICD-10-CM | POA: Diagnosis not present

## 2018-05-15 DIAGNOSIS — M79662 Pain in left lower leg: Secondary | ICD-10-CM

## 2018-05-15 DIAGNOSIS — R262 Difficulty in walking, not elsewhere classified: Secondary | ICD-10-CM | POA: Diagnosis not present

## 2018-05-15 DIAGNOSIS — M25671 Stiffness of right ankle, not elsewhere classified: Secondary | ICD-10-CM

## 2018-05-15 DIAGNOSIS — M25672 Stiffness of left ankle, not elsewhere classified: Secondary | ICD-10-CM | POA: Diagnosis not present

## 2018-05-15 DIAGNOSIS — R252 Cramp and spasm: Secondary | ICD-10-CM

## 2018-05-15 DIAGNOSIS — M6281 Muscle weakness (generalized): Secondary | ICD-10-CM | POA: Diagnosis not present

## 2018-05-15 NOTE — Therapy (Signed)
[place], [place], [address] Phone: [phone]   Fax:  [phone]  Physical Therapy Treatment  Patient Details  Name: [name] MRN: [id number] Date of Birth: June 10, 1941 Referring Provider: Sarina Ill MD   Encounter Date: 05/15/2018  PT End of Session - 05/15/18 1136    Visit Number  4    Number of Visits  13    Date for PT Re-Evaluation  05/22/18    Authorization Type  Medicare    PT Start Time  0932    PT Stop Time  1015    PT Time Calculation (min)  43 min    Activity Tolerance  Patient tolerated treatment well    Behavior During Therapy  [facility] for tasks assessed/performed       Past Medical History:  Diagnosis Date  . Colon polyp   . Diverticulosis    extensive 3'15.  . Dysrhythmia    hx. Ablation for tachycardia, no routine cardiology visits now  . ED (erectile dysfunction)   . Heart murmur    teenager  . History of colon polyps   . History of kidney stones    x1 passed  . Hyperlipidemia   . Neuromuscular disorder ([place])    "neuroma"   . Osteoarthritis   . Palpitation    with svt rate 150  . Tendinitis    achiles heel spur  . Testicular atrophy   . Tubular adenoma of colon     Past Surgical History:  Procedure Laterality Date  . ABLATION  2010   cardiac   . COLONOSCOPY    . EUS N/A 11/17/2014   Procedure: ESOPHAGEAL ENDOSCOPIC ULTRASOUND (EUS) RADIAL;  Surgeon: [name], MD;  Location: [facility];  Service: Endoscopy;  Laterality: N/A;  . FINE NEEDLE ASPIRATION N/A 11/17/2014   Procedure: FINE NEEDLE ASPIRATION (FNA) LINEAR;  Surgeon: [name], MD;  Location: [facility];  Service: Endoscopy;  Laterality: N/A;  + or- fna  . FLEXIBLE SIGMOIDOSCOPY    . HEMORRHOID SURGERY     banding   . HERNIA REPAIR Bilateral    '05 bilateral hernia  . KNEE ARTHROSCOPY Left    scope '08  . KNEE ARTHROSCOPY    . LAPAROSCOPIC APPENDECTOMY N/A 09/05/2014   Procedure:  APPENDECTOMY LAPAROSCOPIC;  Surgeon: [name], MD;  Location: [place];  Service: General;  Laterality: N/A;  . ROTATOR CUFF REPAIR Right 2016    There were no vitals filed for this visit.      [facility] PT Assessment - 05/15/18 0945      AROM   Right Knee Extension  5    Right Knee Flexion  130    Left Knee Extension  5    Left Knee Flexion  134    Right Ankle Dorsiflexion  8    Right Ankle Plantar Flexion  56    Left Ankle Dorsiflexion  7    Left Ankle Plantar Flexion  45      Strength   Right Hip Flexion  4/5    Right Hip Extension  4-/5    Right Hip ABduction  4-/5    Left Hip Flexion  4/5    Left Hip Extension  4-/5    Left Hip ABduction  4-/5    Right Knee Flexion  4/5    Right Knee Extension  4/5    Left Knee Flexion  4/5    Left Knee Extension  4-/5    Right Ankle  Dorsiflexion  4-/5    Right Ankle Plantar Flexion  4/5    Left Ankle Dorsiflexion  4-/5    Left Ankle Plantar Flexion  4/5                   [facility] Adult PT Treatment/Exercise - 05/15/18 0943      Ambulation/Gait   Gait Comments  Pt walks with tight hamstrings and unable to straightten mid stande bil      Knee/Hip Exercises: Stretches   Active Hamstring Stretch  30 seconds;4 reps    Active Hamstring Stretch Limitations  seated x 2 and 2 with strap on left side only    Piriformis Stretch  3 reps;30 seconds;Left    Other Knee/Hip Stretches  guided with hamstring stretch and IT band stretch on left with manual cuies to pt.        Knee/Hip Exercises: Aerobic   Nustep  level 6 -67minutes      Knee/Hip Exercises: Standing   Lateral Step Up  2 sets;Hand Hold: 2;10 reps    Forward Step Up  2 sets;Hand Hold: 2;10 reps    Wall Squat  2 sets;10 reps sink squat       Knee/Hip Exercises: Seated   Long Writer;Left;10 reps    Heel Slides  Left;AROM;10 reps    Sit to Sand  10 reps;2 sets      Knee/Hip Exercises: Supine   Terminal Knee Extension  2 sets;15  reps;Strengthening;Left 1 set with ball behind knee    Theraband Level (Terminal Knee Extension)  Level 3 (Green)    Bridges  1 set;Both;10 reps    Bridges with Clamshell  Strengthening;1 set;10 reps green t band    Straight Leg Raises  2 sets;Strengthening;Left;10 reps      Knee/Hip Exercises: Sidelying   Clams  pt does not tolerate being on side             PT Education - 05/15/18 0950    Education Details  added to HEP for basic knee /hip/ ankle strengthening    Person(s) Educated  Patient    Methods  Explanation;Demonstration;Tactile cues;Verbal cues;Handout    Comprehension  Verbalized understanding;Returned demonstration       PT Short Term Goals - 04/10/18 1016      PT SHORT TERM GOAL #1   Title  STG=LTG        PT Long Term Goals - 05/15/18 1016      PT LONG TERM GOAL #1   Title  Pt will be independent with advanced HEP    Baseline  Pt working on advanced exercises     Time  6    Period  Weeks    Status  On-going      PT LONG TERM GOAL #2   Title  Pt will be able to stand and walk for 30 minutes with pain level 3/10 or less in bil legs to incorporate walking for exercise in HEP    Baseline  walking for 25 minutes today    Time  6    Period  Weeks    Status  On-going      PT LONG TERM GOAL #3   Title  Pt will be able to heel raise for 20 times to be able to negotiate steps with 75 % greater ease     Baseline  PT is 50% better today    Time  6    Period  Weeks  Status  On-going      PT LONG TERM GOAL #4   Title  "FOTO will improve from  68% limitaion    to 47% limitation    indicating improved functional mobility .     Baseline  FOTO limitaion at eval 68%    Time  6    Period  Weeks    Status  Unable to assess      PT LONG TERM GOAL #5   Title  Pt will increase DF bil to at least 10 degrees to improve gait and increase stride length for more normalized gait function    Baseline  Pt  right 8 degrees DF and 7 on left     Time  6    Period  Weeks     Status  On-going      PT LONG TERM GOAL #6   Title  "Pt will improve bil  knee extensor/flexor strength to >/= 4+/5 with </= 3/10 pain to promote safety with walking/standing activities    Baseline  Pt with LE MMT at least 4-/5 to 4+/5     Time  6    Period  Weeks    Status  On-going            Plan - 05/15/18 1127    Clinical Impression Statement  Pt making progress toward MMT strength with 4-/5 to 4+/5 throughout LE's  Pt has tight hamstrings/gastrocs bilaterally and is afraid to irritate it with stretching.  Concentrating on movement and stretching to pt tolerance.  Pt had 6/10 pain in  legs and was 0-1/10 at end of session with exercise.  Pt declined TPDN and will concentrate on exercises and movement for remainder of sessions    Rehab Potential  Excellent    PT Frequency  2x / week    PT Duration  6 weeks    PT Treatment/Interventions  ADLs/Self Care Home Management;Cryotherapy;Electrical Stimulation;Iontophoresis 4mg /ml Dexamethasone;Moist Heat;Ultrasound;Therapeutic exercise;Therapeutic activities;Functional mobility training;Stair training;Gait training;Neuromuscular re-education;Patient/family education;Manual techniques;Passive range of motion;Dry needling;Taping    PT Next Visit Plan  work on standing exercises and slant board and stretching of hamstrings and gastroc for ambulation comfort add bridging and SL bridging as able    PT Home Exercise Plan  towel DF gastroc stretch quad set, gastroc stretch on level and step and sitting hamstring stretch SLR and sidelying clams, terminal knee extension., SAQ, LAQ, supine clams, step ups  gastroc and soleus stretchl    Consulted and Agree with Plan of Care  Patient       Patient will benefit from skilled therapeutic intervention in order to improve the following deficits and impairments:  Abnormal gait, Pain, Increased fascial restricitons, Increased muscle spasms, Impaired flexibility, Difficulty walking, Decreased strength,  Decreased range of motion, Decreased mobility, Decreased activity tolerance  Visit Diagnosis: Pain in left lower leg  Pain in right lower leg  Difficulty in walking, not elsewhere classified  Muscle weakness (generalized)  Stiffness of right ankle, not elsewhere classified  Stiffness of left ankle, not elsewhere classified  Cramp and spasm     Problem List Patient Active Problem List   Diagnosis Date Noted  . 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 Unilateral primary osteoarthritis, left knee 03/17/2018  . Mass of stomach 11/02/2014  . 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 Solitary pulmonary nodule 10/26/2014  . Appendicitis 09/05/2014  . Palpitations 02/15/2011  . SUPRAVENTRICULAR TACHYCARDIA 01/11/2011   Voncille Lo, PT Certified Exercise Expert for the Aging Adult  05/15/18 11:36  AM Phone: [phone] Fax: [facility] [address] [place], [place], [address] Phone: [phone]   Fax:  [phone]  Name: [name] MRN: [id number] Date of Birth: 04-08-1941

## 2018-05-15 NOTE — Patient Instructions (Addendum)
         Achilles / Gastroc, Standing   Stand, right foot behind, heel on floor and turned slightly out, leg straight, forward leg bent. Move hips forward. Hold _30__ seconds. Repeat _3__ times per session. Do _3__ sessions per day.   Stretching: Soleus   Stand with right foot back, both knees bent. Keeping heel on floor, turned slightly out, lean into wall until stretch is felt in lower calf. Hold _30___ seconds. Repeat __3__ times per set. Do __3__ sessions per day.     Voncille Lo, PT Certified Exercise Expert for the Aging Adult  05/15/18 10:00 AM Phone: [phone] Fax: [phone]

## 2018-05-22 ENCOUNTER — Encounter: Payer: Self-pay | Admitting: Physical Therapy

## 2018-05-22 ENCOUNTER — Ambulatory Visit: Payer: Medicare Other | Admitting: Physical Therapy

## 2018-05-22 DIAGNOSIS — R262 Difficulty in walking, not elsewhere classified: Secondary | ICD-10-CM

## 2018-05-22 DIAGNOSIS — M79662 Pain in left lower leg: Secondary | ICD-10-CM | POA: Diagnosis not present

## 2018-05-22 DIAGNOSIS — R252 Cramp and spasm: Secondary | ICD-10-CM

## 2018-05-22 DIAGNOSIS — M25672 Stiffness of left ankle, not elsewhere classified: Secondary | ICD-10-CM | POA: Diagnosis not present

## 2018-05-22 DIAGNOSIS — M25671 Stiffness of right ankle, not elsewhere classified: Secondary | ICD-10-CM

## 2018-05-22 DIAGNOSIS — M6281 Muscle weakness (generalized): Secondary | ICD-10-CM

## 2018-05-22 DIAGNOSIS — M79661 Pain in right lower leg: Secondary | ICD-10-CM | POA: Diagnosis not present

## 2018-05-22 NOTE — Patient Instructions (Signed)
Stretch when tissues are warm  May need to take shorter steps with walking up hills

## 2018-05-22 NOTE — Therapy (Addendum)
[place], [place], [address] Phone: [phone]   Fax:  [phone]  Physical Therapy Treatment/Discharge Note  Patient Details  Name: [name] MRN: [id number] Date of Birth: 01/14/1941 Referring Provider: Sarina Ill MD   Encounter Date: 05/22/2018  PT End of Session - 05/22/18 1031    Visit Number  5    Number of Visits  13    Date for PT Re-Evaluation  05/22/18    PT Start Time  0933    PT Stop Time  1015    PT Time Calculation (min)  42 min    Activity Tolerance  Patient tolerated treatment well    Behavior During Therapy  [facility] for tasks assessed/performed       Past Medical History:  Diagnosis Date  . Colon polyp   . Diverticulosis    extensive 3'15.  . Dysrhythmia    hx. Ablation for tachycardia, no routine cardiology visits now  . ED (erectile dysfunction)   . Heart murmur    teenager  . History of colon polyps   . History of kidney stones    x1 passed  . Hyperlipidemia   . Neuromuscular disorder ([place])    "neuroma"   . Osteoarthritis   . Palpitation    with svt rate 150  . Tendinitis    achiles heel spur  . Testicular atrophy   . Tubular adenoma of colon     Past Surgical History:  Procedure Laterality Date  . ABLATION  2010   cardiac   . COLONOSCOPY    . EUS N/A 11/17/2014   Procedure: ESOPHAGEAL ENDOSCOPIC ULTRASOUND (EUS) RADIAL;  Surgeon: [name], MD;  Location: [facility];  Service: Endoscopy;  Laterality: N/A;  . FINE NEEDLE ASPIRATION N/A 11/17/2014   Procedure: FINE NEEDLE ASPIRATION (FNA) LINEAR;  Surgeon: [name], MD;  Location: [facility];  Service: Endoscopy;  Laterality: N/A;  + or- fna  . FLEXIBLE SIGMOIDOSCOPY    . HEMORRHOID SURGERY     banding   . HERNIA REPAIR Bilateral    '05 bilateral hernia  . KNEE ARTHROSCOPY Left    scope '08  . KNEE ARTHROSCOPY    . LAPAROSCOPIC APPENDECTOMY N/A 09/05/2014   Procedure: APPENDECTOMY  LAPAROSCOPIC;  Surgeon: [name], MD;  Location: [place];  Service: General;  Laterality: N/A;  . ROTATOR CUFF REPAIR Right 2016    There were no vitals filed for this visit.  Subjective Assessment - 05/22/18 1018    Subjective  The bed exercise with the bands is the hardest.  I have been doing the exercises.      Currently in Pain?  Yes    Pain Score  6     Pain Location  Leg    Pain Orientation  Right;Left    Pain Descriptors / Indicators  Aching;Dull also has neuropathy pain in feet    Pain Type  Chronic pain;Neuropathic pain    Pain Frequency  Constant    Aggravating Factors   walking around the block    Pain Relieving Factors  wearing shoes    Multiple Pain Sites  -- back pain,  long standing no number given for pain                       [facility] Adult PT Treatment/Exercise - 05/22/18 0001      Knee/Hip Exercises: Stretches   Gastroc Stretch  -- 10 X2 sets feet partially on step.  Heel raise/ lowerslowly     Other Knee/Hip Stretches  seated, foot on foam roller placed on airex.  Rpolled foam out in 10 x 2 sets each,      Knee/Hip Exercises: Machines for Strengthening   Cybex Leg Press  1 plate both feet .  Foot plate flat and into DF  for stretch      Knee/Hip Exercises: Standing   Terminal Knee Extension  Both;10 reps;2 sets 3 positions,  blue band,  Min assist hip position    Rocker Board  1 minute about 20 x  CGA hips for position      Knee/Hip Exercises: Seated   Other Seated Knee/Hip Exercises  hip abduction blue band 10 X 2 sets does not like to do in hooklying      Knee/Hip Exercises: Supine   Short Arc Quad Sets  20 reps;Both after warm 5 second hold.      Other Supine Knee/Hip Exercises  hold SAQ and 20 X DF,  both      Moist Heat Therapy   Moist Heat Location  -- posterior leg,  during supine ex to promote flexibility             PT Education - 05/22/18 1031    Education Details  Stretching  and walking suggestions    Person(s)  Educated  Patient    Methods  Explanation    Comprehension  Verbalized understanding       PT Short Term Goals - 04/10/18 1016      PT SHORT TERM GOAL #1   Title  STG=LTG        PT Long Term Goals - 05/15/18 1016      PT LONG TERM GOAL #1   Title  Pt will be independent with advanced HEP    Baseline  Pt working on advanced exercises     Time  6    Period  Weeks    Status  On-going      PT LONG TERM GOAL #2   Title  Pt will be able to stand and walk for 30 minutes with pain level 3/10 or less in bil legs to incorporate walking for exercise in HEP    Baseline  walking for 25 minutes today    Time  6    Period  Weeks    Status  On-going      PT LONG TERM GOAL #3   Title  Pt will be able to heel raise for 20 times to be able to negotiate steps with 75 % greater ease     Baseline  PT is 50% better today    Time  6    Period  Weeks    Status  On-going      PT LONG TERM GOAL #4   Title  "FOTO will improve from  68% limitaion    to 47% limitation    indicating improved functional mobility .     Baseline  FOTO limitaion at eval 68%    Time  6    Period  Weeks    Status  Unable to assess      PT LONG TERM GOAL #5   Title  Pt will increase DF bil to at least 10 degrees to improve gait and increase stride length for more normalized gait function    Baseline  Pt  right 8 degrees DF and 7 on left     Time  6    Period  Weeks    Status  On-going      PT LONG TERM GOAL #6   Title  "Pt will improve bil  knee extensor/flexor strength to >/= 4+/5 with </= 3/10 pain to promote safety with walking/standing activities    Baseline  Pt with LE MMT at least 4-/5 to 4+/5     Time  6    Period  Weeks    Status  On-going            Plan - 05/22/18 1032    Clinical Impression Statement  Focus today on patient paced stretching/  strengthening.  No new objective findings since last session except feels he is more flexible in legs.    PT Next Visit Plan  work on standing exercises  and slant board and stretching of hamstrings and gastroc for ambulation comfort add bridging and SL bridging as able,  check hip flexor tightness    PT Home Exercise Plan  towel DF gastroc stretch quad set, gastroc stretch on level and step and sitting hamstring stretch SLR and sidelying clams, terminal knee extension., SAQ, LAQ, supine clams, step ups  gastroc and soleus stretchl    Consulted and Agree with Plan of Care  Patient       Patient will benefit from skilled therapeutic intervention in order to improve the following deficits and impairments:     Visit Diagnosis: Pain in left lower leg  Pain in right lower leg  Difficulty in walking, not elsewhere classified  Muscle weakness (generalized)  Stiffness of right ankle, not elsewhere classified  Stiffness of left ankle, not elsewhere classified  Cramp and spasm     Problem List Patient Active Problem List   Diagnosis Date Noted  . 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 Unilateral primary osteoarthritis, left knee 03/17/2018  . Mass of stomach 11/02/2014  . 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 Solitary pulmonary nodule 10/26/2014  . Appendicitis 09/05/2014  . Palpitations 02/15/2011  . SUPRAVENTRICULAR TACHYCARDIA 01/11/2011    [name]  PTA 05/22/2018, 10:35 AM  [facility] [address] [place], [place], [address] Phone: [phone]   Fax:  [phone]  Name: [name] MRN: [id number] Date of Birth: 01-20-1941   PHYSICAL THERAPY DISCHARGE SUMMARY  Visits from Start of Care: 5 Current functional level related to goals / functional outcomes: unknown   Remaining deficits: unknown   Education / Equipment: HEP Plan: Patient agrees to discharge.  Patient goals were not met. Patient is being discharged due to not returning since the last visit.  ?????     Noted on 06-14-18 Pt to ED for facial droop.  Pt did not return to clinic after 05-22-18, self  discharged reason unknown  Voncille Lo, PT Certified Exercise Expert for the Aging Adult  06/26/18 11:58 AM Phone: [phone] Fax: [phone]

## 2018-06-09 DIAGNOSIS — H2513 Age-related nuclear cataract, bilateral: Secondary | ICD-10-CM | POA: Diagnosis not present

## 2018-06-09 DIAGNOSIS — G902 Horner's syndrome: Secondary | ICD-10-CM | POA: Diagnosis not present

## 2018-06-10 DIAGNOSIS — G902 Horner's syndrome: Secondary | ICD-10-CM | POA: Diagnosis not present

## 2018-06-13 ENCOUNTER — Other Ambulatory Visit: Payer: Self-pay | Admitting: Family Medicine

## 2018-06-13 DIAGNOSIS — G902 Horner's syndrome: Secondary | ICD-10-CM

## 2018-06-14 ENCOUNTER — Emergency Department (HOSPITAL_COMMUNITY): Payer: Medicare Other

## 2018-06-14 ENCOUNTER — Observation Stay (HOSPITAL_COMMUNITY): Payer: Medicare Other

## 2018-06-14 ENCOUNTER — Encounter (HOSPITAL_COMMUNITY): Payer: Self-pay | Admitting: Emergency Medicine

## 2018-06-14 ENCOUNTER — Observation Stay (HOSPITAL_COMMUNITY)
Admission: EM | Admit: 2018-06-14 | Discharge: 2018-06-15 | Disposition: A | Discharge status: Home / Self Care | Payer: Medicare Other | Attending: Family Medicine | Admitting: Family Medicine

## 2018-06-14 DIAGNOSIS — I10 Essential (primary) hypertension: Secondary | ICD-10-CM

## 2018-06-14 DIAGNOSIS — N400 Enlarged prostate without lower urinary tract symptoms: Secondary | ICD-10-CM

## 2018-06-14 DIAGNOSIS — Z79899 Other long term (current) drug therapy: Secondary | ICD-10-CM | POA: Insufficient documentation

## 2018-06-14 DIAGNOSIS — G902 Horner's syndrome: Secondary | ICD-10-CM | POA: Diagnosis not present

## 2018-06-14 DIAGNOSIS — G7000 Myasthenia gravis without (acute) exacerbation: Secondary | ICD-10-CM

## 2018-06-14 DIAGNOSIS — J449 Chronic obstructive pulmonary disease, unspecified: Secondary | ICD-10-CM | POA: Diagnosis not present

## 2018-06-14 DIAGNOSIS — Z87891 Personal history of nicotine dependence: Secondary | ICD-10-CM | POA: Diagnosis not present

## 2018-06-14 DIAGNOSIS — G609 Hereditary and idiopathic neuropathy, unspecified: Secondary | ICD-10-CM | POA: Diagnosis not present

## 2018-06-14 DIAGNOSIS — Z7982 Long term (current) use of aspirin: Secondary | ICD-10-CM | POA: Insufficient documentation

## 2018-06-14 DIAGNOSIS — R29810 Facial weakness: Secondary | ICD-10-CM | POA: Diagnosis not present

## 2018-06-14 DIAGNOSIS — I639 Cerebral infarction, unspecified: Secondary | ICD-10-CM | POA: Diagnosis not present

## 2018-06-14 DIAGNOSIS — R9431 Abnormal electrocardiogram [ECG] [EKG]: Secondary | ICD-10-CM | POA: Diagnosis not present

## 2018-06-14 LAB — CBC
HCT: 43.1 % (ref 39.0–52.0)
Hemoglobin: 14.3 g/dL (ref 13.0–17.0)
MCH: 31 pg (ref 26.0–34.0)
MCHC: 33.2 g/dL (ref 30.0–36.0)
MCV: 93.5 fL (ref 78.0–100.0)
Platelets: 241 10*3/uL (ref 150–400)
RBC: 4.61 MIL/uL (ref 4.22–5.81)
RDW: 12.8 % (ref 11.5–15.5)
WBC: 5.6 10*3/uL (ref 4.0–10.5)

## 2018-06-14 LAB — HEMOGLOBIN A1C
Hgb A1c MFr Bld: 5.3 % (ref 4.8–5.6)
Mean Plasma Glucose: 105.41 mg/dL

## 2018-06-14 LAB — LIPID PANEL
Cholesterol: 138 mg/dL (ref 0–200)
HDL: 48 mg/dL (ref >40)
LDL Cholesterol: 76 mg/dL (ref 0–99)
Total CHOL/HDL Ratio: 2.9 RATIO
Triglycerides: 71 mg/dL (ref <150)
VLDL: 14 mg/dL (ref 0–40)

## 2018-06-14 LAB — COMPREHENSIVE METABOLIC PANEL
ALT: 26 U/L (ref 0–44)
AST: 23 U/L (ref 15–41)
Albumin: 4.1 g/dL (ref 3.5–5.0)
Alkaline Phosphatase: 52 U/L (ref 38–126)
Anion gap: 8 (ref 5–15)
BUN: 17 mg/dL (ref 8–23)
CO2: 25 mmol/L (ref 22–32)
Calcium: 9 mg/dL (ref 8.9–10.3)
Chloride: 103 mmol/L (ref 98–111)
Creatinine, Ser: 1.19 mg/dL (ref 0.61–1.24)
GFR calc Af Amer: >60 mL/min (ref >60)
GFR calc non Af Amer: 57 mL/min — ABNORMAL LOW (ref >60)
Glucose, Bld: 159 mg/dL — ABNORMAL HIGH (ref 70–99)
Potassium: 4 mmol/L (ref 3.5–5.1)
Sodium: 136 mmol/L (ref 135–145)
Total Bilirubin: 1.1 mg/dL (ref 0.3–1.2)
Total Protein: 7.2 g/dL (ref 6.5–8.1)

## 2018-06-14 LAB — DIFFERENTIAL
Abs Immature Granulocytes: 0 10*3/uL (ref 0.0–0.1)
Basophils Absolute: 0 10*3/uL (ref 0.0–0.1)
Basophils Relative: 1 %
Eosinophils Absolute: 0.1 10*3/uL (ref 0.0–0.7)
Eosinophils Relative: 3 %
Immature Granulocytes: 0 %
Lymphocytes Relative: 30 %
Lymphs Abs: 1.7 10*3/uL (ref 0.7–4.0)
Monocytes Absolute: 0.6 10*3/uL (ref 0.1–1.0)
Monocytes Relative: 11 %
Neutro Abs: 3.1 10*3/uL (ref 1.7–7.7)
Neutrophils Relative %: 55 %

## 2018-06-14 LAB — I-STAT CHEM 8, ED
BUN: 15 mg/dL (ref 8–23)
Calcium, Ion: 1.18 mmol/L (ref 1.15–1.40)
Chloride: 103 mmol/L (ref 98–111)
Creatinine, Ser: 1 mg/dL (ref 0.61–1.24)
Glucose, Bld: 133 mg/dL — ABNORMAL HIGH (ref 70–99)
HCT: 47 % (ref 39.0–52.0)
Hemoglobin: 16 g/dL (ref 13.0–17.0)
Potassium: 4.1 mmol/L (ref 3.5–5.1)
Sodium: 144 mmol/L (ref 135–145)
TCO2: 28 mmol/L (ref 22–32)

## 2018-06-14 LAB — PROTIME-INR
INR: 0.94
Prothrombin Time: 12.4 seconds (ref 11.4–15.2)

## 2018-06-14 LAB — CBG MONITORING, ED: Glucose-Capillary: 160 mg/dL — ABNORMAL HIGH (ref 70–99)

## 2018-06-14 LAB — I-STAT TROPONIN, ED: Troponin i, poc: 0 ng/mL (ref 0.00–0.08)

## 2018-06-14 LAB — GLUCOSE, CAPILLARY
Glucose-Capillary: 109 mg/dL — ABNORMAL HIGH (ref 70–99)
Glucose-Capillary: 104 mg/dL — ABNORMAL HIGH (ref 70–99)

## 2018-06-14 LAB — VITAMIN B12: Vitamin B-12: 2300 pg/mL — ABNORMAL HIGH (ref 180–914)

## 2018-06-14 LAB — SEDIMENTATION RATE: Sed Rate: 2 mm/hr (ref 0–16)

## 2018-06-14 LAB — C-REACTIVE PROTEIN: CRP: <0.8 mg/dL (ref <1.0)

## 2018-06-14 LAB — TSH: TSH: 1.548 u[IU]/mL (ref 0.350–4.500)

## 2018-06-14 LAB — APTT: aPTT: 31 seconds (ref 24–36)

## 2018-06-14 MED ORDER — DOXAZOSIN MESYLATE 8 MG PO TABS: 8.0000 mg | ORAL_TABLET | Freq: Every day | ORAL | Status: DC

## 2018-06-14 MED ORDER — ASPIRIN EC 81 MG PO TBEC
81.0000 mg | DELAYED_RELEASE_TABLET | Freq: Every day | ORAL | Status: DC
  Administered 2018-06-15: 81 mg via ORAL
  Filled 2018-06-14 (×2): qty 1

## 2018-06-14 MED ORDER — ONDANSETRON HCL 4 MG PO TABS: 4.0000 mg | ORAL_TABLET | Freq: Four times a day (QID) | ORAL | Status: DC | PRN

## 2018-06-14 MED ORDER — SODIUM CHLORIDE 0.9 % IV SOLN
INTRAVENOUS | Status: AC
  Administered 2018-06-14: 18:00:00 via INTRAVENOUS

## 2018-06-14 MED ORDER — ADULT MULTIVITAMIN W/MINERALS CH
1.0000 | ORAL_TABLET | Freq: Every day | ORAL | Status: DC
  Administered 2018-06-15: 1 via ORAL
  Filled 2018-06-14 (×2): qty 1

## 2018-06-14 MED ORDER — B COMPLEX PO TABS: 1.0000 | ORAL_TABLET | Freq: Every day | ORAL | Status: DC

## 2018-06-14 MED ORDER — INSULIN ASPART 100 UNIT/ML ~~LOC~~ SOLN: 0.0000 [IU] | Freq: Three times a day (TID) | SUBCUTANEOUS | Status: DC

## 2018-06-14 MED ORDER — B COMPLEX-C PO TABS
1.0000 | ORAL_TABLET | Freq: Every day | ORAL | Status: DC
  Administered 2018-06-15: 1 via ORAL
  Filled 2018-06-14 (×3): qty 1

## 2018-06-14 MED ORDER — PSYLLIUM 28 % PO PACK: 1.0000 | PACK | Freq: Every day | ORAL | Status: DC

## 2018-06-14 MED ORDER — DOXAZOSIN MESYLATE 8 MG PO TABS
8.0000 mg | ORAL_TABLET | Freq: Every day | ORAL | Status: DC
  Administered 2018-06-15: 8 mg via ORAL
  Filled 2018-06-14: qty 1

## 2018-06-14 MED ORDER — FINASTERIDE 5 MG PO TABS: 5.0000 mg | ORAL_TABLET | Freq: Every day | ORAL | Status: DC

## 2018-06-14 MED ORDER — FINASTERIDE 5 MG PO TABS
5.0000 mg | ORAL_TABLET | Freq: Every day | ORAL | Status: DC
  Administered 2018-06-15: 5 mg via ORAL
  Filled 2018-06-14: qty 1

## 2018-06-14 MED ORDER — DULOXETINE HCL 30 MG PO CPEP: 30.0000 mg | ORAL_CAPSULE | Freq: Every day | ORAL | Status: DC

## 2018-06-14 MED ORDER — IOPAMIDOL (ISOVUE-370) INJECTION 76%
50.0000 mL | Freq: Once | INTRAVENOUS | Status: AC | PRN
  Administered 2018-06-14: 50 mL via INTRAVENOUS

## 2018-06-14 MED ORDER — PSYLLIUM 95 % PO PACK
1.0000 | PACK | Freq: Every day | ORAL | Status: DC
  Administered 2018-06-15: 1 via ORAL
  Filled 2018-06-14: qty 1

## 2018-06-14 MED ORDER — ATORVASTATIN CALCIUM 20 MG PO TABS: 10.0000 mg | ORAL_TABLET | Freq: Every day | ORAL | Status: DC

## 2018-06-14 MED ORDER — ATORVASTATIN CALCIUM 10 MG PO TABS
10.0000 mg | ORAL_TABLET | Freq: Every day | ORAL | Status: DC
  Administered 2018-06-14: 10 mg via ORAL
  Filled 2018-06-14: qty 1

## 2018-06-14 MED ORDER — CALCIUM CARBONATE-VITAMIN D 500-200 MG-UNIT PO TABS
1.0000 | ORAL_TABLET | Freq: Every day | ORAL | Status: DC
  Administered 2018-06-15: 1 via ORAL
  Filled 2018-06-14: qty 1

## 2018-06-14 MED ORDER — IOPAMIDOL (ISOVUE-370) INJECTION 76%
INTRAVENOUS | Status: AC
  Administered 2018-06-14: 17:00:00
  Filled 2018-06-14: qty 50

## 2018-06-14 MED ORDER — ACETAMINOPHEN 325 MG PO TABS: 650.0000 mg | ORAL_TABLET | Freq: Four times a day (QID) | ORAL | Status: DC | PRN

## 2018-06-14 MED ORDER — ACETAMINOPHEN 650 MG RE SUPP: 650.0000 mg | Freq: Four times a day (QID) | RECTAL | Status: DC | PRN

## 2018-06-14 MED ORDER — GLUCOSAMINE-CHONDROITIN-VIT C 2000-1200-60 MG/30ML PO LIQD: Freq: Every day | ORAL | Status: DC

## 2018-06-14 MED ORDER — SENNOSIDES-DOCUSATE SODIUM 8.6-50 MG PO TABS: 1.0000 | ORAL_TABLET | Freq: Every evening | ORAL | Status: DC | PRN

## 2018-06-14 MED ORDER — ONDANSETRON HCL 4 MG/2ML IJ SOLN: 4.0000 mg | Freq: Four times a day (QID) | INTRAMUSCULAR | Status: DC | PRN

## 2018-06-14 MED ORDER — CALCIUM CARBONATE-VITAMIN D 600-400 MG-UNIT PO TABS: 1.0000 | ORAL_TABLET | Freq: Every day | ORAL | Status: DC

## 2018-06-14 NOTE — Consult Note (Signed)
NEURO HOSPITALIST CONSULT NOTE   Requestig physician: Dr. Melina Copa   Reason for Consult: left eye droop History obtained from:  Patient     HPI:                                                                                                                                          [name] is an 77 y.o. male  With PMH significant for HLD, dysrhythmia (s/p ablation 2010), presents to [facility] ED for 1 week history of left eye drooping.  Last Saturday wife stated that she noticed that his left eye lid was drooping. At the time that his wife noticed the patient did not notice.  Late Saturday evening patient was watching tv and felt that his left eye closed and obstructed his ability to watch TV. Patient denies any focal weakness, or HA with this eye lid drooping. Family noticed that the left side of his face looked droopy in addition to the eye, some dysarthria and stated that he has complained that his lips don't work right.  Denies any gait problems. 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  He had his blood drawn and since wife could not get a response for results and was told that the MRI would be later d/t paperwork, family decided to bring him to the hospital. Denies any head/neck trauma, MVC, neck pain, focal weakness, HA, diplopia, CP or SOB or chiropractic maneuver.  Endorses having a 74 year smoking history, but stopped smoking about 17 years ago. Patient has been using lubricating drops for his itchy/dry eyes which he thinks is r/t allergies.   CT head: normal head CT, GFR: 57, BG: 159 last BP: 127/85  No history of strokes or MIs in the past.  Past Medical History:  Diagnosis Date  . Colon polyp   . Diverticulosis    extensive 3'15.  . Dysrhythmia    hx. Ablation for tachycardia, no routine cardiology visits now  . ED (erectile dysfunction)   . Heart murmur     teenager  . History of colon polyps   . History of kidney stones    x1 passed  . Hyperlipidemia   . 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 Osteoarthritis   . Palpitation    with svt rate 150  . Tendinitis    achiles heel spur  . Testicular atrophy   . Tubular adenoma of colon     Past Surgical History:  Procedure Laterality Date  . ABLATION  2010   cardiac   . COLONOSCOPY    . EUS N/A 11/17/2014   Procedure: ESOPHAGEAL ENDOSCOPIC ULTRASOUND (EUS) RADIAL;  Surgeon: Gwyndolyn Saxon  Paulita Fujita, MD;  Location: [facility];  Service: Endoscopy;  Laterality: N/A;  . FINE NEEDLE ASPIRATION N/A 11/17/2014   Procedure: FINE NEEDLE ASPIRATION (FNA) LINEAR;  Surgeon: [name], MD;  Location: [facility];  Service: Endoscopy;  Laterality: N/A;  + or- fna  . FLEXIBLE SIGMOIDOSCOPY    . HEMORRHOID SURGERY     banding   . HERNIA REPAIR Bilateral    '05 bilateral hernia  . KNEE ARTHROSCOPY Left    scope '08  . KNEE ARTHROSCOPY    . LAPAROSCOPIC APPENDECTOMY N/A 09/05/2014   Procedure: APPENDECTOMY LAPAROSCOPIC;  Surgeon: [name], MD;  Location: [place];  Service: General;  Laterality: N/A;  . ROTATOR CUFF REPAIR Right 2016    Family History  Problem Relation Age of Onset  . Pancreatic cancer Father   . Stroke Mother   . Aneurysm Brother   . Neuropathy Neg Hx          Social History:  reports that he quit smoking about 17 years ago. His smoking use included cigarettes. He has a 47.00 pack-year smoking history. He has never used smokeless tobacco. He reports that he drinks about 1.0 standard drinks of alcohol per week. He reports that he does not use drugs.  Allergies  Allergen Reactions  . Pollen Extract     MEDICATIONS:                                                                                                                     Current Facility-Administered Medications  Medication Dose Route Frequency Provider Last Rate Last Dose  . iopamidol (ISOVUE-370) 76 %  injection            Current Outpatient Medications  Medication Sig Dispense Refill  . aspirin 81 MG EC tablet Take 81 mg by mouth daily.      Marland Kitchen atorvastatin (LIPITOR) 10 MG tablet Take 10 mg by mouth daily.     Marland Kitchen b complex vitamins tablet Take 1 tablet by mouth daily.    . Calcium Carbonate-Vitamin D (CALCIUM 600+D) 600-400 MG-UNIT per tablet Take 1 tablet by mouth daily.      Marland Kitchen doxazosin (CARDURA) 8 MG tablet Take 8 mg by mouth daily.    . DULoxetine (CYMBALTA) 30 MG capsule Take 30 mg by mouth daily.  12  . finasteride (PROSCAR) 5 MG tablet Take 5 mg by mouth daily.   2  . GLUCOSAMINE-CHONDROITIN-VIT C PO Take 1 tablet by mouth daily.      . Multiple Vitamin (MULTIVITAMIN) tablet Take 1 tablet by mouth daily.      . predniSONE (DELTASONE) 10 MG tablet Take 10 mg by mouth daily with breakfast. Taper pack, now taking 40 mg daily x 3 days followed by 20 mg x 4 days then STOP.    Marland Kitchen psyllium (METAMUCIL SMOOTH TEXTURE) 28 % packet Take 1 packet by mouth daily.        ROS:  History obtained from the patient  General ROS: negative for - chills, fatigue, fever, night sweats, weight gain or weight loss Ophthalmic ROS: negative for - blurry vision, double vision, eye pain or loss of vision ENT ROS: negative for - epistaxis, nasal discharge, oral lesions, sore throat, tinnitus or vertigo Allergy and Immunology ROS: positive for - dry eyes, itchy/ eyes Respiratory ROS: negative for - cough, hemoptysis, shortness of breath or wheezing Cardiovascular ROS: negative for - chest pain, dyspnea on exertion, edema or irregular heartbeat Musculoskeletal ROS: negative for - joint swelling or muscular weakness Neurological ROS: as noted in HPI Dermatological ROS: negative for rash and skin lesion changes Blood pressure 127/85, pulse 60, temperature 98 F (36.7 C), temperature  source Oral, resp. rate 13, weight 90.7 kg, SpO2 93 %. General Examination:                                                                                                      Physical Exam  HEENT-  Normocephalic, no lesions, without obvious abnormality.  Left eye lid drooping normal  conjunctiva.   Cardiovascular-  pulses palpable throughout   Lungs- no excessive working breathing.  Saturations within normal limits on RA Extremities- Warm, dry and intact Musculoskeletal-no joint tenderness, deformity or swelling Skin-warm and dry, no hyperpigmentation, vitiligo, or suspicious lesions  Neurological Examination Mental Status: Alert, oriented, thought content appropriate.  Speech fluent without evidence of aphasia.  Able to follow  commands without difficulty. Cranial Nerves: II:  Visual fields grossly normal,  III,IV, VI: ptosis present of left eye lid, extra-ocular motions intact bilaterally pupils  round, reactive to light and accommodation. Left pupil is about 0.5- 35mm smaller than the right eye, and though reactive, it is is slower to react than right pupil.  The left pupil also dilates slower than the right pupil when the lights are turned off. V,VII: smile symmetric, facial light touch sensation normal bilaterally VIII: hearing normal bilaterally IX,X: uvula rises symmetrically XI: bilateral shoulder shrug XII: midline tongue extension Motor: Right : Upper extremity   5/5    Left:     Upper extremity   5/5  Lower extremity   5/5     Lower extremity   5/5 Tone and bulk:normal tone throughout; no atrophy noted Sensory:  light touch intact throughout, bilaterally Cerebellar: normal finger-to-nose, normal heel-to-shin test Gait: deferred   Lab Results: Basic Metabolic Panel: Recent Labs  Lab 06/14/18 0953 06/14/18 1000  NA 136 144  K 4.0 4.1  CL 103 103  CO2 25  --   GLUCOSE 159* 133*  BUN 17 15  CREATININE 1.19 1.00  CALCIUM 9.0  --    CBC: Recent Labs  Lab  06/14/18 0953 06/14/18 1000  WBC 5.6  --   NEUTROABS 3.1  --   HGB 14.3 16.0  HCT 43.1 47.0  MCV 93.5  --   PLT 241  --    Imaging: Ct Head Wo Contrast  Result Date: 06/14/2018 CLINICAL DATA:  Left eye drooping 1 week. Negative Horner's syndrome workup. EXAM: CT HEAD WITHOUT CONTRAST  TECHNIQUE: Contiguous axial images were obtained from the base of the skull through the vertex without intravenous contrast. COMPARISON:  None. FINDINGS: Brain: No evidence of acute infarction, hemorrhage, hydrocephalus, extra-axial collection or mass lesion/mass effect. Vascular: No hyperdense vessel or unexpected calcification. Skull: Normal. Negative for fracture or focal lesion. Sinuses/Orbits: No acute finding. Other: None. IMPRESSION: Normal head CT. Electronically Signed   By: Marin Olp M.D.   On: 06/14/2018 11:27   Assessment: Patient is a 77 year old man with PMH significant for HLD, dysrhythmia (s/p ablation 2010), presents to [facility] ED for 1 week history of left eye drooping. CT head was normal.  On examination, he has evidence of Horner syndrome.  Also has a history of possible left facial droop and dysarthria-making stroke as the top differential. Differentials include brainstem stroke versus carotid dissection. There is less likely to be a neuromuscular disease such as myasthenia gravis given persistent symptoms only in one eye causing ptosis without any diplopia, dysphagia or any other muscular symptoms.  Given the age, demyelinating process unlikely.  Impression: Horner syndrome- further imaging needed Brainstem stroke vs Carotid dissection- Further imaging needed  Recommendations: -CTA head and neck stat -MRI brain w/o contrast - with thin slices of brainstem - stat -Admit for stroke work up Telemetry 2D echo Hemoglobin A1c, fasting lipid panel Frequent neurochecks Physical therapy, occupational therapy, speech therapy. Aspirin 325 Atorvastatin 80 Allow for permissive hypertension in  the first 24 to 48 hours.  Only treat blood pressure if the systolic blood pressures greater than 220-on a as needed basis.  Upon discharge, normalize to systolic goal of less than 140. If cardiac monitoring reveals atrial fibrillation, might need to consider anti-coag elation although localization of the stroke to the brainstem is usually related to small vessel disease and not cardioembolic but cardio embolic events can also cause brainstem strokes.  I have relayed my plan to the patient, his wife and daughter at bedside and answered all the questions.  Stroke team will follow the patient.  Laurey Morale, MSN, NP-C [facility] [phone]   Attending Neurohospitalist Addendum Patient seen and examined with APP/Resident. Agree with the history and physical as documented above. Agree with the plan as documented, which I helped formulate.  I have made the edits in the plan above as needed. I have independently reviewed the chart, obtained history, review of systems and examined the patient.I have personally reviewed pertinent head/neck/spine imaging (CT/MRI). Please feel free to call with any questions. --- Amie Portland, MD [facility] Pager: [phone]  If 7pm to 7am, please call on call as listed on AMION.

## 2018-06-14 NOTE — H&P (Signed)
History and Physical    [name]:[id number] DOB: 03-Jun-1941 DOA: 06/14/2018  PCP: Hulan Fess, MD Patient coming from: Home  Chief Complaint: Left-sided facial droop  HPI: [name] is a 77 y.o. male with medical history significant of with past medical history of hyperlipidemia, idiopathic peripheral neuropathy, arrhythmia status post ablation, COPD, erectile dysfunction came to the hospital for evaluation of left-sided ptosis and facial droop.  Patient states about a week ago he started noticing that his left eyelid would not open therefore went to go see an ophthalmologist on Monday.  He was told there is nothing wrong with his eyes but few tests were ordered.  Patient states nothing further was done and kept on waiting for these test in the meantime his symptoms progress to where he was not able to lift his eyebrows, had abnormal sensation in his lips where he is not able to vessel and some indentation in his cheeks.  Due to the symptoms he came to the ER today for further evaluation.   In the ER patient was hemodynamically stable.  His routine labs were unremarkable.  Was evaluated by the neurologist who recommended getting MRI of the brain and CTA of the head and neck.  CTA of the head and neck was negative for any large vessel occlusion.  MRI of the brain is pending.  Medical team requested to admit the patient for observation.   Review of Systems: As per HPI otherwise 10 point review of systems negative.  Review of Systems Otherwise negative except as per HPI, including: General: Denies fever, chills, night sweats or unintended weight loss. Resp: Denies cough, wheezing, shortness of breath. Cardiac: Denies chest pain, palpitations, orthopnea, paroxysmal nocturnal dyspnea. GI: Denies abdominal pain, nausea, vomiting, diarrhea or constipation GU: Denies dysuria, frequency, hesitancy or incontinence MS: Denies muscle aches, joint pain or swelling Neuro: Denies headache,   abnormal gait Psych: Denies anxiety, depression, SI/HI/AVH Skin: Denies new rashes or lesions ID: Denies sick contacts, exotic exposures, travel  Past Medical History:  Diagnosis Date  . Colon polyp   . Diverticulosis    extensive 3'15.  . Dysrhythmia    hx. Ablation for tachycardia, no routine cardiology visits now  . ED (erectile dysfunction)   . Heart murmur    teenager  . History of colon polyps   . History of kidney stones    x1 passed  . Hyperlipidemia   . 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 Osteoarthritis   . Palpitation    with svt rate 150  . Tendinitis    achiles heel spur  . Testicular atrophy   . Tubular adenoma of colon     Past Surgical History:  Procedure Laterality Date  . ABLATION  2010   cardiac   . COLONOSCOPY    . EUS N/A 11/17/2014   Procedure: ESOPHAGEAL ENDOSCOPIC ULTRASOUND (EUS) RADIAL;  Surgeon: [name], MD;  Location: [facility];  Service: Endoscopy;  Laterality: N/A;  . FINE NEEDLE ASPIRATION N/A 11/17/2014   Procedure: FINE NEEDLE ASPIRATION (FNA) LINEAR;  Surgeon: [name], MD;  Location: [facility];  Service: Endoscopy;  Laterality: N/A;  + or- fna  . FLEXIBLE SIGMOIDOSCOPY    . HEMORRHOID SURGERY     banding   . HERNIA REPAIR Bilateral    '05 bilateral hernia  . KNEE ARTHROSCOPY Left    scope '08  . KNEE ARTHROSCOPY    . LAPAROSCOPIC APPENDECTOMY N/A 09/05/2014   Procedure: APPENDECTOMY LAPAROSCOPIC;  Surgeon:  [name], MD;  Location: [place];  Service: General;  Laterality: N/A;  . ROTATOR CUFF REPAIR Right 2016    SOCIAL HISTORY:  reports that he quit smoking about 17 years ago. His smoking use included cigarettes. He has a 47.00 pack-year smoking history. He has never used smokeless tobacco. He reports that he drinks about 1.0 standard drinks of alcohol per week. He reports that he does not use drugs.  Allergies  Allergen Reactions  . Pollen Extract     FAMILY HISTORY: Family History    Problem Relation Age of Onset  . Pancreatic cancer Father   . Stroke Mother   . Aneurysm Brother   . Neuropathy Neg Hx      Prior to Admission medications   Medication Sig Start Date End Date Taking? Authorizing Provider  aspirin 81 MG EC tablet Take 81 mg by mouth daily.      [provider]  atorvastatin (LIPITOR) 10 MG tablet Take 10 mg by mouth daily.  09/03/14   [provider]  b complex vitamins tablet Take 1 tablet by mouth daily.    [provider]  Calcium Carbonate-Vitamin D (CALCIUM 600+D) 600-400 MG-UNIT per tablet Take 1 tablet by mouth daily.      [provider]  doxazosin (CARDURA) 8 MG tablet Take 8 mg by mouth daily.    [provider]  DULoxetine (CYMBALTA) 30 MG capsule Take 30 mg by mouth daily. 02/23/16   [provider]  finasteride (PROSCAR) 5 MG tablet Take 5 mg by mouth daily.  08/20/14   [provider]  GLUCOSAMINE-CHONDROITIN-VIT C PO Take 1 tablet by mouth daily.      [provider]  Multiple Vitamin (MULTIVITAMIN) tablet Take 1 tablet by mouth daily.      [provider]  predniSONE (DELTASONE) 10 MG tablet Take 10 mg by mouth daily with breakfast. Taper pack, now taking 40 mg daily x 3 days followed by 20 mg x 4 days then STOP.    [provider]  psyllium (METAMUCIL SMOOTH TEXTURE) 28 % packet Take 1 packet by mouth daily.     [provider]    Physical Exam: Vitals:   06/14/18 1307 06/14/18 1315 06/14/18 1345 06/14/18 1419  BP:  (!) 147/86 127/78   Pulse: 70 65 63   Resp: 17 18 15    Temp:    98 F (36.7 C)  TempSrc:      SpO2: 96% 97% 98%   Weight:          Constitutional: NAD, calm, comfortable Eyes: PERRL, lids and conjunctivae normal ENMT: Mucous membranes are moist. Posterior pharynx clear of any exudate or lesions.Normal dentition.  Neck: normal, supple, no masses, no thyromegaly Respiratory: clear to auscultation bilaterally, no  wheezing, no crackles. Normal respiratory effort. No accessory muscle use.  Cardiovascular: Regular rate and rhythm, no murmurs / rubs / gallops. No extremity edema. 2+ pedal pulses. No carotid bruits.  Abdomen: no tenderness, no masses palpated. No hepatosplenomegaly. Bowel sounds positive.  Musculoskeletal: no clubbing / cyanosis. No joint deformity upper and lower extremities. Good ROM, no contractures. Normal muscle tone.  Skin: no rashes, lesions, ulcers. No induration Neurologic: Left-sided ptosis, lack of wrinkles and forehead, abnormal sensation around his lips.  No other focal abnormality noted. Psychiatric: Normal judgment and insight. Alert and oriented x 3. Normal mood.     Labs on Admission: I have personally reviewed following labs and imaging studies  CBC: Recent Labs  Lab 06/14/18 0953 06/14/18 1000  WBC 5.6  --   NEUTROABS 3.1  --   HGB 14.3 16.0  HCT 43.1 47.0  MCV 93.5  --   PLT 241  --    Basic Metabolic Panel: Recent Labs  Lab 06/14/18 0953 06/14/18 1000  NA 136 144  K 4.0 4.1  CL 103 103  CO2 25  --   GLUCOSE 159* 133*  BUN 17 15  CREATININE 1.19 1.00  CALCIUM 9.0  --    GFR: Estimated Creatinine Clearance: 66.8 mL/min (by C-G formula based on SCr of 1 mg/dL). Liver Function Tests: Recent Labs  Lab 06/14/18 0953  AST 23  ALT 26  ALKPHOS 52  BILITOT 1.1  PROT 7.2  ALBUMIN 4.1   No results for input(s): LIPASE, AMYLASE in the last 168 hours. No results for input(s): AMMONIA in the last 168 hours. Coagulation Profile: Recent Labs  Lab 06/14/18 0953  INR 0.94   Cardiac Enzymes: No results for input(s): CKTOTAL, CKMB, CKMBINDEX, TROPONINI in the last 168 hours. BNP (last 3 results) No results for input(s): PROBNP in the last 8760 hours. HbA1C: No results for input(s): HGBA1C in the last 72 hours. CBG: Recent Labs  Lab 06/14/18 0945  GLUCAP 160*   Lipid Profile: No results for input(s): CHOL, HDL, LDLCALC, TRIG, CHOLHDL,  LDLDIRECT in the last 72 hours. Thyroid Function Tests: No results for input(s): TSH, T4TOTAL, FREET4, T3FREE, THYROIDAB in the last 72 hours. Anemia Panel: No results for input(s): VITAMINB12, FOLATE, FERRITIN, TIBC, IRON, RETICCTPCT in the last 72 hours. Urine analysis:    Component Value Date/Time   COLORURINE YELLOW 09/05/2014 [place] 09/05/2014 1218   LABSPEC 1.007 09/05/2014 1218   PHURINE 7.5 09/05/2014 [place] 09/05/2014 [place] 09/05/2014 1218   BILIRUBINUR NEGATIVE 09/05/2014 [place] 09/05/2014 1218   PROTEINUR NEGATIVE 09/05/2014 1218   UROBILINOGEN 0.2 09/05/2014 1218   NITRITE NEGATIVE 09/05/2014 1218   LEUKOCYTESUR NEGATIVE 09/05/2014 1218   Sepsis Labs: !!!!!!!!!!!!!!!!!!!!!!!!!!!!!!!!!!!!!!!!!!!! @LABRCNTIP (procalcitonin:4,lacticidven:4) )No results found for this or any previous visit (from the past 240 hour(s)).   Radiological Exams on Admission: Ct Angio Head W/cm &/or Wo Cm  Result Date: 06/14/2018 CLINICAL DATA:  77 year old male with unexplained left eye drooping for 1 week. Abnormal speech onset today. EXAM: CT ANGIOGRAPHY HEAD AND NECK TECHNIQUE: Multidetector CT imaging of the head and neck was performed using the standard protocol during bolus administration of intravenous contrast. Multiplanar CT image reconstructions and MIPs were obtained to evaluate the vascular anatomy. Carotid stenosis measurements (when applicable) are obtained utilizing NASCET criteria, using the distal internal carotid diameter as the denominator. CONTRAST:  66mL ISOVUE-370 IOPAMIDOL (ISOVUE-370) INJECTION 76% COMPARISON:  Head CT without contrast 1106 hours. [place] Chest CT 10/27/2014. FINDINGS: CTA NECK Skeleton: C2-C3 ankylosis or incomplete segmentation. Possible C3-C4 and developing C4-C5 ankylosis which appears degenerative in nature. No lower cervical or upper thoracic spine ankylosis. No acute osseous  abnormality identified. Visualized paranasal sinuses and mastoids are stable and well pneumatized. Upper chest: Mild upper lung atelectasis with suspicion of superimposed centrilobular emphysema. Small non dependent 15 millimeter area of ground-glass opacity in the right upper lobe on series 5, image 179. this is probably stable since 2015 when allowing for different lung volumes today (series 3, image 10 at that time). No superior mediastinal lymphadenopathy. Other neck: Negative. Aortic arch: 4 vessel arch configuration, the left vertebral artery arises directly from the  arch. Distal arch ectasia up to 33 millimeters diameter which appears to continue in the visible proximal descending aorta. Minimal arch atherosclerosis. Right carotid system: Negative aside from tortuosity. No atherosclerosis. Left carotid system: Tortuosity but otherwise negative. Minimal calcified plaque along the medial left carotid bifurcation. Vertebral arteries: Normal proximal right subclavian artery and right vertebral artery origin. Mildly tortuous right V1 segment. The right vertebral artery appears mildly dominant and is patent to the skull base without stenosis. The left vertebral artery arises directly from the arch and is mildly non dominant. Mildly late entry into the cervical transverse foramen. Patent left vertebral to the skull base without stenosis. CTA HEAD Posterior circulation: Mildly tortuous distal vertebral arteries without stenosis. The right is dominant. Normal PICA origins. Patent, mildly dolichoectatic vertebrobasilar junction. Tortuous basilar artery without stenosis. Patent SCA origins. Normal PCA origins. Posterior communicating arteries are diminutive or absent. Mild right P1 tortuosity. PCA branches are within normal limits. Anterior circulation: Patent ICA siphons with mild tortuosity and minimal atherosclerosis. No ICA stenosis. Normal ophthalmic artery origins. Patent carotid termini. Normal MCA and ACA  origins. The right A1 is slightly dominant. Anterior communicating artery and bilateral ACA branches are normal. Left MCA M1 segment, bifurcation, and left MCA branches are within normal limits. Right MCA M1 segment, trifurcation, and right MCA branches are within normal limits. Venous sinuses: Patent on the delayed images. Anatomic variants: Left vertebral artery arises directly from the arch. Right vertebral is dominant. Right ACA is slightly dominant. Delayed phase: No abnormal enhancement identified. Gray-white matter differentiation is stable from earlier today and within normal limits. Review of the MIP images confirms the above findings IMPRESSION: 1. Negative for large vessel occlusion, dissection, or arterial stenosis in the head or neck. There is minimal atherosclerosis. 2. Positive for generalized arterial tortuosity. Ectatic distal aortic arch and proximal descending aorta. 3. A small 15 mm area of ground-glass opacity in the right upper lobe appears stable since 2015. 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 Electronically Signed   By: Genevie Ann M.D.   On: 06/14/2018 14:08   Ct Head Wo Contrast  Result Date: 06/14/2018 CLINICAL DATA:  Left eye drooping 1 week. Negative Horner's syndrome workup. EXAM: CT HEAD WITHOUT CONTRAST TECHNIQUE: Contiguous axial images were obtained from the base of the skull through the vertex without intravenous contrast. COMPARISON:  None. FINDINGS: Brain: No evidence of acute infarction, hemorrhage, hydrocephalus, extra-axial collection or mass lesion/mass effect. Vascular: No hyperdense vessel or unexpected calcification. Skull: Normal. Negative for fracture or focal lesion. Sinuses/Orbits: No acute finding. Other: None. IMPRESSION: Normal head CT. Electronically Signed   By: Marin Olp M.D.   On: 06/14/2018 11:27   Ct Angio Neck W And/or Wo Contrast  Result Date: 06/14/2018 CLINICAL DATA:  77 year old male with unexplained left eye drooping for 1  week. Abnormal speech onset today. EXAM: CT ANGIOGRAPHY HEAD AND NECK TECHNIQUE: Multidetector CT imaging of the head and neck was performed using the standard protocol during bolus administration of intravenous contrast. Multiplanar CT image reconstructions and MIPs were obtained to evaluate the vascular anatomy. Carotid stenosis measurements (when applicable) are obtained utilizing NASCET criteria, using the distal internal carotid diameter as the denominator. CONTRAST:  58mL ISOVUE-370 IOPAMIDOL (ISOVUE-370) INJECTION 76% COMPARISON:  Head CT without contrast 1106 hours. [place] Chest CT 10/27/2014. FINDINGS: CTA NECK Skeleton: C2-C3 ankylosis or incomplete segmentation. Possible C3-C4 and developing C4-C5 ankylosis which appears degenerative in nature. No lower cervical or upper thoracic spine ankylosis. No acute osseous abnormality identified.  Visualized paranasal sinuses and mastoids are stable and well pneumatized. Upper chest: Mild upper lung atelectasis with suspicion of superimposed centrilobular emphysema. Small non dependent 15 millimeter area of ground-glass opacity in the right upper lobe on series 5, image 179. this is probably stable since 2015 when allowing for different lung volumes today (series 3, image 10 at that time). No superior mediastinal lymphadenopathy. Other neck: Negative. Aortic arch: 4 vessel arch configuration, the left vertebral artery arises directly from the arch. Distal arch ectasia up to 33 millimeters diameter which appears to continue in the visible proximal descending aorta. Minimal arch atherosclerosis. Right carotid system: Negative aside from tortuosity. No atherosclerosis. Left carotid system: Tortuosity but otherwise negative. Minimal calcified plaque along the medial left carotid bifurcation. Vertebral arteries: Normal proximal right subclavian artery and right vertebral artery origin. Mildly tortuous right V1 segment. The right vertebral artery appears  mildly dominant and is patent to the skull base without stenosis. The left vertebral artery arises directly from the arch and is mildly non dominant. Mildly late entry into the cervical transverse foramen. Patent left vertebral to the skull base without stenosis. CTA HEAD Posterior circulation: Mildly tortuous distal vertebral arteries without stenosis. The right is dominant. Normal PICA origins. Patent, mildly dolichoectatic vertebrobasilar junction. Tortuous basilar artery without stenosis. Patent SCA origins. Normal PCA origins. Posterior communicating arteries are diminutive or absent. Mild right P1 tortuosity. PCA branches are within normal limits. Anterior circulation: Patent ICA siphons with mild tortuosity and minimal atherosclerosis. No ICA stenosis. Normal ophthalmic artery origins. Patent carotid termini. Normal MCA and ACA origins. The right A1 is slightly dominant. Anterior communicating artery and bilateral ACA branches are normal. Left MCA M1 segment, bifurcation, and left MCA branches are within normal limits. Right MCA M1 segment, trifurcation, and right MCA branches are within normal limits. Venous sinuses: Patent on the delayed images. Anatomic variants: Left vertebral artery arises directly from the arch. Right vertebral is dominant. Right ACA is slightly dominant. Delayed phase: No abnormal enhancement identified. Gray-white matter differentiation is stable from earlier today and within normal limits. Review of the MIP images confirms the above findings IMPRESSION: 1. Negative for large vessel occlusion, dissection, or arterial stenosis in the head or neck. There is minimal atherosclerosis. 2. Positive for generalized arterial tortuosity. Ectatic distal aortic arch and proximal descending aorta. 3. A small 15 mm area of ground-glass opacity in the right upper lobe appears stable since 2015. 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 Electronically Signed   By: Genevie Ann M.D.   On:  06/14/2018 14:08     All images have been reviewed by me personally.    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 Blood pressures can be gradually normalized to SBP<140 upon discharge. -MRI brain without contrast and MRA head and neck without contrast (given deranged renal function) -Maintain Euthermia.  -Lipid Panel, TSH and A1C. B12 and folate.  -Frequent neuro checks  -Risk factor modification -Consulted Neurology -PT/OT eval, Speech consult  History of idiopathic peripheral polyneuropathy - Continue home regimen of Cymbalta.  Hyperlipidemia -On statin at home?  BPH - On Cardura and Proscar  History of COPD - Bronchodilators  as needed.  DVT prophylaxis: SCDs Code Status: Full  Family Communication: Daughter and wife at Bedside  Disposition Plan:  TBD Consults called: Neurology Admission status: Obs tele   Time Spent: 65 minutes.  >50% of the time was devoted to discussing the patients care, assessment, plan and disposition with other care givers along with counseling the patient about the risks and benefits of treatment.   Please Note: This patient record was dictated using Editor, commissioning. Chart creation errors have been sought, but may not always have been located. Such creation errors do not reflect on the Standard of Medical Care.   Ankit Arsenio Loader MD [facility] Pager [phone]  If 7PM-7AM, please contact night-coverage [email or web address] Password [email or web address]  06/14/2018, 2:30 PM

## 2018-06-14 NOTE — ED Provider Notes (Signed)
[facility] Provider Note   CSN: [id number] Arrival date & time: 06/14/18  4782     History   Chief Complaint Chief Complaint  Patient presents with  . Facial Droop    HPI [name] is a 77 y.o. male.  He is presenting with left eye droop of the lid since Saturday.  It seems to be okay when he wakes up and becomes more droopy is the day goes on.  His family is also noticed the left side of his face may look a little droopy and the patient has been complaining that his lips do not feel like they are working his gait is normal.  He has not been losing any food or liquid out of his mouth open.  His daughter felt that his speech sounded a little more slurred during midweek but feels back to normal now.  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  He had a blood work done today but the family was concerned that may be something else was going on as of brought him here.  He is got no headache no eyeball pain he thinks his vision is normal and if anything seems with more distinct color.  No numbness no weakness no chest pain no shortness of breath no cough.  Says has had some allergy issues in his eyes been itchy.  He has been using some lubrication eyedrops from his eye doctor.  The history is provided by the patient.  Cerebrovascular Accident  This is a new problem. The current episode started more than 1 week ago. The problem has been gradually improving. Pertinent negatives include no chest pain, no abdominal pain, no headaches and no shortness of breath. Nothing aggravates the symptoms. The symptoms are relieved by rest. He has tried nothing for the symptoms. The treatment provided no relief.    Past Medical History:  Diagnosis Date  . Colon polyp   . Diverticulosis    extensive 3'15.  . Dysrhythmia    hx. Ablation for tachycardia, no routine  cardiology visits now  . ED (erectile dysfunction)   . Heart murmur    teenager  . History of colon polyps   . History of kidney stones    x1 passed  . Hyperlipidemia   . 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 Osteoarthritis   . Palpitation    with svt rate 150  . Tendinitis    achiles heel spur  . Testicular atrophy   . Tubular adenoma of colon     Patient Active Problem List   Diagnosis Date Noted  . 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 Unilateral primary osteoarthritis, left knee 03/17/2018  . Mass of stomach 11/02/2014  . 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 Solitary pulmonary nodule 10/26/2014  . Appendicitis 09/05/2014  . Palpitations 02/15/2011  . SUPRAVENTRICULAR TACHYCARDIA 01/11/2011    Past Surgical History:  Procedure Laterality Date  . ABLATION  2010   cardiac   . COLONOSCOPY    . EUS N/A 11/17/2014   Procedure: ESOPHAGEAL ENDOSCOPIC ULTRASOUND (EUS) RADIAL;  Surgeon: [name], MD;  Location: [facility];  Service: Endoscopy;  Laterality: N/A;  . FINE NEEDLE ASPIRATION N/A 11/17/2014   Procedure: FINE NEEDLE ASPIRATION (FNA) LINEAR;  Surgeon: [name], MD;  Location: [facility];  Service: Endoscopy;  Laterality: N/A;  + or- fna  .  FLEXIBLE SIGMOIDOSCOPY    . HEMORRHOID SURGERY     banding   . HERNIA REPAIR Bilateral    '05 bilateral hernia  . KNEE ARTHROSCOPY Left    scope '08  . KNEE ARTHROSCOPY    . LAPAROSCOPIC APPENDECTOMY N/A 09/05/2014   Procedure: APPENDECTOMY LAPAROSCOPIC;  Surgeon: [name], MD;  Location: [facility];  Service: General;  Laterality: N/A;  . ROTATOR CUFF REPAIR Right 2016        Home Medications    Prior to Admission medications   Medication Sig Start Date End Date Taking? Authorizing Provider  aspirin 81 MG EC tablet Take 81 mg by mouth daily.      [provider]  atorvastatin (LIPITOR) 10 MG tablet Take 10 mg by mouth daily.  09/03/14   [provider]  b complex vitamins tablet Take  1 tablet by mouth daily.    [provider]  Calcium Carbonate-Vitamin D (CALCIUM 600+D) 600-400 MG-UNIT per tablet Take 1 tablet by mouth daily.      [provider]  doxazosin (CARDURA) 8 MG tablet Take 8 mg by mouth daily.    [provider]  DULoxetine (CYMBALTA) 30 MG capsule Take 30 mg by mouth daily. 02/23/16   [provider]  finasteride (PROSCAR) 5 MG tablet Take 5 mg by mouth daily.  08/20/14   [provider]  GLUCOSAMINE-CHONDROITIN-VIT C PO Take 1 tablet by mouth daily.      [provider]  Multiple Vitamin (MULTIVITAMIN) tablet Take 1 tablet by mouth daily.      [provider]  predniSONE (DELTASONE) 10 MG tablet Take 10 mg by mouth daily with breakfast. Taper pack, now taking 40 mg daily x 3 days followed by 20 mg x 4 days then STOP.    [provider]  psyllium (METAMUCIL SMOOTH TEXTURE) 28 % packet Take 1 packet by mouth daily.     [provider]    Family History Family History  Problem Relation Age of Onset  . Pancreatic cancer Father   . Stroke Mother   . Aneurysm Brother   . Neuropathy Neg Hx     Social History Social History   Tobacco Use  . Smoking status: Former Smoker    Packs/day: 1.00    Years: 47.00    Pack years: 47.00    Types: Cigarettes    Last attempt to quit: 10/29/2000    Years since quitting: 17.6  . Smokeless tobacco: Never Used  Substance Use Topics  . Alcohol use: Yes    Alcohol/week: 1.0 standard drinks    Types: 1 Standard drinks or equivalent per week    Comment: socially- nightly 1 cocktail  . Drug use: No     Allergies   Pollen extract   Review of Systems Review of Systems  Constitutional: Negative for chills and fever.  HENT: Negative for ear pain and sore throat.   Eyes: Positive for itching. Negative for pain and visual disturbance.  Respiratory: Negative for cough and shortness of breath.   Cardiovascular: Negative for chest pain and  palpitations.  Gastrointestinal: Negative for abdominal pain and vomiting.  Genitourinary: Negative for dysuria and hematuria.  Musculoskeletal: Negative for arthralgias and back pain.  Skin: Negative for color change and rash.  Neurological: Positive for facial asymmetry and speech difficulty. Negative for seizures, syncope and headaches.  All other systems reviewed and are negative.    Physical Exam Updated Vital Signs BP 125/70   Pulse 65  Temp 98 F (36.7 C) (Oral)   Resp 16   Wt 90.7 kg   SpO2 96%   BMI 27.12 kg/m   Physical Exam  Constitutional: He is oriented to person, place, and time. He appears well-developed and well-nourished.  HENT:  Head: Normocephalic and atraumatic.  Eyes: Conjunctivae are normal.  Neck: Neck supple.  Cardiovascular: Normal rate and regular rhythm.  No murmur heard. Pulmonary/Chest: Effort normal and breath sounds normal. No respiratory distress.  Abdominal: Soft. There is no tenderness.  Musculoskeletal: He exhibits no edema, tenderness or deformity.  Neurological: He is alert and oriented to person, place, and time. He has normal strength. A cranial nerve deficit is present. No sensory deficit. Gait normal. GCS eye subscore is 4. GCS verbal subscore is 5. GCS motor subscore is 6.  He has some droop of his left eyelid.  There is no obvious facial asymmetry at this time.  He subjectively feels like his lips are moving the way they usually do.  Skin: Skin is warm and dry.  Psychiatric: He has a normal mood and affect.  Nursing note and vitals reviewed.    ED Treatments / Results  Labs (all labs ordered are listed, but only abnormal results are displayed) Labs Reviewed  COMPREHENSIVE METABOLIC PANEL - Abnormal; Notable for the following components:      Result Value   Glucose, Bld 159 (*)    GFR calc non Af Amer 57 (*)    All other components within normal limits  CBG MONITORING, ED - Abnormal; Notable for the following components:    Glucose-Capillary 160 (*)    All other components within normal limits  I-STAT CHEM 8, ED - Abnormal; Notable for the following components:   Glucose, Bld 133 (*)    All other components within normal limits  PROTIME-INR  APTT  CBC  DIFFERENTIAL  SEDIMENTATION RATE  C-REACTIVE PROTEIN  I-STAT TROPONIN, ED  CBG MONITORING, ED    EKG EKG Interpretation  Date/Time:  Saturday June 14 2018 09:39:44 EDT Ventricular Rate:  90 PR Interval:  180 QRS Duration: 100 QT Interval:  386 QTC Calculation: 472 R Axis:   -19 Text Interpretation:  Normal sinus rhythm Incomplete right bundle branch block Nonspecific T wave abnormality Abnormal ECG similar to prior 11/05 Confirmed by Aletta Edouard [phone]) on 06/14/2018 11:36:33 AM   Radiology Ct Angio Head W/cm &/or Wo Cm  Result Date: 06/14/2018 CLINICAL DATA:  77 year old male with unexplained left eye drooping for 1 week. Abnormal speech onset today. EXAM: CT ANGIOGRAPHY HEAD AND NECK TECHNIQUE: Multidetector CT imaging of the head and neck was performed using the standard protocol during bolus administration of intravenous contrast. Multiplanar CT image reconstructions and MIPs were obtained to evaluate the vascular anatomy. Carotid stenosis measurements (when applicable) are obtained utilizing NASCET criteria, using the distal internal carotid diameter as the denominator. CONTRAST:  9mL ISOVUE-370 IOPAMIDOL (ISOVUE-370) INJECTION 76% COMPARISON:  Head CT without contrast 1106 hours. [place] Chest CT 10/27/2014. FINDINGS: CTA NECK Skeleton: C2-C3 ankylosis or incomplete segmentation. Possible C3-C4 and developing C4-C5 ankylosis which appears degenerative in nature. No lower cervical or upper thoracic spine ankylosis. No acute osseous abnormality identified. Visualized paranasal sinuses and mastoids are stable and well pneumatized. Upper chest: Mild upper lung atelectasis with suspicion of superimposed centrilobular emphysema. Small non  dependent 15 millimeter area of ground-glass opacity in the right upper lobe on series 5, image 179. this is probably stable since 2015 when allowing for different lung volumes today (  series 3, image 10 at that time). No superior mediastinal lymphadenopathy. Other neck: Negative. Aortic arch: 4 vessel arch configuration, the left vertebral artery arises directly from the arch. Distal arch ectasia up to 33 millimeters diameter which appears to continue in the visible proximal descending aorta. Minimal arch atherosclerosis. Right carotid system: Negative aside from tortuosity. No atherosclerosis. Left carotid system: Tortuosity but otherwise negative. Minimal calcified plaque along the medial left carotid bifurcation. Vertebral arteries: Normal proximal right subclavian artery and right vertebral artery origin. Mildly tortuous right V1 segment. The right vertebral artery appears mildly dominant and is patent to the skull base without stenosis. The left vertebral artery arises directly from the arch and is mildly non dominant. Mildly late entry into the cervical transverse foramen. Patent left vertebral to the skull base without stenosis. CTA HEAD Posterior circulation: Mildly tortuous distal vertebral arteries without stenosis. The right is dominant. Normal PICA origins. Patent, mildly dolichoectatic vertebrobasilar junction. Tortuous basilar artery without stenosis. Patent SCA origins. Normal PCA origins. Posterior communicating arteries are diminutive or absent. Mild right P1 tortuosity. PCA branches are within normal limits. Anterior circulation: Patent ICA siphons with mild tortuosity and minimal atherosclerosis. No ICA stenosis. Normal ophthalmic artery origins. Patent carotid termini. Normal MCA and ACA origins. The right A1 is slightly dominant. Anterior communicating artery and bilateral ACA branches are normal. Left MCA M1 segment, bifurcation, and left MCA branches are within normal limits. Right MCA M1  segment, trifurcation, and right MCA branches are within normal limits. Venous sinuses: Patent on the delayed images. Anatomic variants: Left vertebral artery arises directly from the arch. Right vertebral is dominant. Right ACA is slightly dominant. Delayed phase: No abnormal enhancement identified. Gray-white matter differentiation is stable from earlier today and within normal limits. Review of the MIP images confirms the above findings IMPRESSION: 1. Negative for large vessel occlusion, dissection, or arterial stenosis in the head or neck. There is minimal atherosclerosis. 2. Positive for generalized arterial tortuosity. Ectatic distal aortic arch and proximal descending aorta. 3. A small 15 mm area of ground-glass opacity in the right upper lobe appears stable since 2015. 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 Electronically Signed   By: Genevie Ann M.D.   On: 06/14/2018 14:08   Ct Head Wo Contrast  Result Date: 06/14/2018 CLINICAL DATA:  Left eye drooping 1 week. Negative Horner's syndrome workup. EXAM: CT HEAD WITHOUT CONTRAST TECHNIQUE: Contiguous axial images were obtained from the base of the skull through the vertex without intravenous contrast. COMPARISON:  None. FINDINGS: Brain: No evidence of acute infarction, hemorrhage, hydrocephalus, extra-axial collection or mass lesion/mass effect. Vascular: No hyperdense vessel or unexpected calcification. Skull: Normal. Negative for fracture or focal lesion. Sinuses/Orbits: No acute finding. Other: None. IMPRESSION: Normal head CT. Electronically Signed   By: Marin Olp M.D.   On: 06/14/2018 11:27   Ct Angio Neck W And/or Wo Contrast  Result Date: 06/14/2018 CLINICAL DATA:  77 year old male with unexplained left eye drooping for 1 week. Abnormal speech onset today. EXAM: CT ANGIOGRAPHY HEAD AND NECK TECHNIQUE: Multidetector CT imaging of the head and neck was performed using the standard protocol during bolus administration of  intravenous contrast. Multiplanar CT image reconstructions and MIPs were obtained to evaluate the vascular anatomy. Carotid stenosis measurements (when applicable) are obtained utilizing NASCET criteria, using the distal internal carotid diameter as the denominator. CONTRAST:  37mL ISOVUE-370 IOPAMIDOL (ISOVUE-370) INJECTION 76% COMPARISON:  Head CT without contrast 1106 hours. [facility] Chest CT 10/27/2014. FINDINGS: CTA NECK Skeleton: C2-C3  ankylosis or incomplete segmentation. Possible C3-C4 and developing C4-C5 ankylosis which appears degenerative in nature. No lower cervical or upper thoracic spine ankylosis. No acute osseous abnormality identified. Visualized paranasal sinuses and mastoids are stable and well pneumatized. Upper chest: Mild upper lung atelectasis with suspicion of superimposed centrilobular emphysema. Small non dependent 15 millimeter area of ground-glass opacity in the right upper lobe on series 5, image 179. this is probably stable since 2015 when allowing for different lung volumes today (series 3, image 10 at that time). No superior mediastinal lymphadenopathy. Other neck: Negative. Aortic arch: 4 vessel arch configuration, the left vertebral artery arises directly from the arch. Distal arch ectasia up to 33 millimeters diameter which appears to continue in the visible proximal descending aorta. Minimal arch atherosclerosis. Right carotid system: Negative aside from tortuosity. No atherosclerosis. Left carotid system: Tortuosity but otherwise negative. Minimal calcified plaque along the medial left carotid bifurcation. Vertebral arteries: Normal proximal right subclavian artery and right vertebral artery origin. Mildly tortuous right V1 segment. The right vertebral artery appears mildly dominant and is patent to the skull base without stenosis. The left vertebral artery arises directly from the arch and is mildly non dominant. Mildly late entry into the cervical transverse foramen.  Patent left vertebral to the skull base without stenosis. CTA HEAD Posterior circulation: Mildly tortuous distal vertebral arteries without stenosis. The right is dominant. Normal PICA origins. Patent, mildly dolichoectatic vertebrobasilar junction. Tortuous basilar artery without stenosis. Patent SCA origins. Normal PCA origins. Posterior communicating arteries are diminutive or absent. Mild right P1 tortuosity. PCA branches are within normal limits. Anterior circulation: Patent ICA siphons with mild tortuosity and minimal atherosclerosis. No ICA stenosis. Normal ophthalmic artery origins. Patent carotid termini. Normal MCA and ACA origins. The right A1 is slightly dominant. Anterior communicating artery and bilateral ACA branches are normal. Left MCA M1 segment, bifurcation, and left MCA branches are within normal limits. Right MCA M1 segment, trifurcation, and right MCA branches are within normal limits. Venous sinuses: Patent on the delayed images. Anatomic variants: Left vertebral artery arises directly from the arch. Right vertebral is dominant. Right ACA is slightly dominant. Delayed phase: No abnormal enhancement identified. Gray-white matter differentiation is stable from earlier today and within normal limits. Review of the MIP images confirms the above findings IMPRESSION: 1. Negative for large vessel occlusion, dissection, or arterial stenosis in the head or neck. There is minimal atherosclerosis. 2. Positive for generalized arterial tortuosity. Ectatic distal aortic arch and proximal descending aorta. 3. A small 15 mm area of ground-glass opacity in the right upper lobe appears stable since 2015. 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 Electronically Signed   By: Genevie Ann M.D.   On: 06/14/2018 14:08   Mr Brain Wo Contrast  Result Date: 06/14/2018 CLINICAL DATA:  Stroke follow-up.  Left facial droop EXAM: MRI HEAD WITHOUT CONTRAST TECHNIQUE: Multiplanar, multiecho pulse sequences  of the brain and surrounding structures were obtained without intravenous contrast. COMPARISON:  CT head 06/14/2018 FINDINGS: Brain: Ventricle size and cerebral volume normal. Negative for acute or chronic infarct. Chronic microhemorrhage left frontal and left occipital parietal white matter. Negative for mass or fluid collection. Vascular: Normal arterial flow voids. Skull and upper cervical spine: Negative Sinuses/Orbits: Mild mucosal edema paranasal sinuses.  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 Electronically Signed   By: Franchot Gallo M.D.   On: 06/14/2018 15:36    Procedures  Procedures (including critical care time)  Medications Ordered in ED Medications - No data to display   Initial Impression / Assessment and Plan / ED Course  I have reviewed the triage vital signs and the nursing notes.  Pertinent labs & imaging results that were available during my care of the patient were reviewed by me and considered in my medical decision making (see chart for details).  Clinical Course as of Jun 15 1811  Sat Jun 14, 2018  1206 Discussed with Dr. Malen Gauze from neurology who recommends getting CTA head and neck.  I re-looked at the pupils and they are basically symmetric it is possible that the left side may be a millimeter smaller than the right.   [MB]    Clinical Course User Index [MB] Hayden Rasmussen, MD     Final Clinical Impressions(s) / ED Diagnoses   Final diagnoses:  Facial droop    ED Discharge Orders    None       Hayden Rasmussen, MD 06/14/18 [phone]

## 2018-06-14 NOTE — Progress Notes (Signed)
PHARMACIST - PHYSICIAN ORDER COMMUNICATION  CONCERNING: P&T Medication Policy on Herbal Medications  DESCRIPTION:  This patient's order for: Glucosamine-Chondroitin  has been noted.  This product(s) is classified as an "herbal" or natural product. Due to a lack of definitive safety studies or FDA approval, nonstandard manufacturing practices, plus the potential risk of unknown drug-drug interactions while on inpatient medications, the Pharmacy and Therapeutics Committee does not permit the use of "herbal" or natural products of this type within [facility].   ACTION TAKEN: The pharmacy department is unable to verify this order at this time and your patient has been informed of this safety policy. Please reevaluate patient's clinical condition at discharge and address if the herbal or natural product(s) should be resumed at that time.   Alycia Rossetti, PharmD, BCPS Pager: [phone] 5:55 PM

## 2018-06-14 NOTE — ED Notes (Signed)
Pt complains of left eye lip drooping, difficulty with bottom lip(unable to whistle or pucker like normal), had some slurring of speech x 2 days ago. Speech is clear now. Pt complains of left eye discomfort, using lubrication for dry eyes. Axox4. NO weakness, dizziness, ha, sensory issues.

## 2018-06-14 NOTE — ED Notes (Signed)
Pt ambulated to the restroom and back independently without difficulty.

## 2018-06-14 NOTE — ED Notes (Signed)
Pt taken to MRI  

## 2018-06-14 NOTE — ED Triage Notes (Signed)
Pt states last Saturday his left eye started drooping, went to his eye doctor and was referred to pcp for r/o of horner's syndrome (test was negative) but they wanted pt to get an  Mri but due to insurance purposes there was a delay in getting it scheduled.  pts wife states that when pt wakes up in the am his eye is not drooping, pt denies any weakness or numbness in arm or leg, speech clear today but pts family states that Thursday speech was "off", a/ox4, smile symmetrical but pt unable to open left eye completely and unable to pucker his mouth.

## 2018-06-15 ENCOUNTER — Observation Stay (HOSPITAL_BASED_OUTPATIENT_CLINIC_OR_DEPARTMENT_OTHER): Payer: Medicare Other

## 2018-06-15 DIAGNOSIS — G7000 Myasthenia gravis without (acute) exacerbation: Secondary | ICD-10-CM | POA: Diagnosis not present

## 2018-06-15 DIAGNOSIS — I361 Nonrheumatic tricuspid (valve) insufficiency: Secondary | ICD-10-CM | POA: Diagnosis not present

## 2018-06-15 DIAGNOSIS — R29810 Facial weakness: Secondary | ICD-10-CM | POA: Diagnosis not present

## 2018-06-15 LAB — GLUCOSE, CAPILLARY
Glucose-Capillary: 88 mg/dL (ref 70–99)
Glucose-Capillary: 103 mg/dL — ABNORMAL HIGH (ref 70–99)
Glucose-Capillary: 89 mg/dL (ref 70–99)

## 2018-06-15 LAB — CBC
HCT: 44.2 % (ref 39.0–52.0)
Hemoglobin: 14.2 g/dL (ref 13.0–17.0)
MCH: 29.1 pg (ref 26.0–34.0)
MCHC: 32.1 g/dL (ref 30.0–36.0)
MCV: 90.6 fL (ref 78.0–100.0)
Platelets: 218 10*3/uL (ref 150–400)
RBC: 4.88 MIL/uL (ref 4.22–5.81)
RDW: 13.2 % (ref 11.5–15.5)
WBC: 5.5 10*3/uL (ref 4.0–10.5)

## 2018-06-15 LAB — COMPREHENSIVE METABOLIC PANEL
ALT: 18 U/L (ref 0–44)
AST: 17 U/L (ref 15–41)
Albumin: 3.3 g/dL — ABNORMAL LOW (ref 3.5–5.0)
Alkaline Phosphatase: 47 U/L (ref 38–126)
Anion gap: 6 (ref 5–15)
BUN: 11 mg/dL (ref 8–23)
CO2: 29 mmol/L (ref 22–32)
Calcium: 8.7 mg/dL — ABNORMAL LOW (ref 8.9–10.3)
Chloride: 109 mmol/L (ref 98–111)
Creatinine, Ser: 0.95 mg/dL (ref 0.61–1.24)
GFR calc Af Amer: >60 mL/min (ref >60)
GFR calc non Af Amer: >60 mL/min (ref >60)
Glucose, Bld: 103 mg/dL — ABNORMAL HIGH (ref 70–99)
Potassium: 3.8 mmol/L (ref 3.5–5.1)
Sodium: 144 mmol/L (ref 135–145)
Total Bilirubin: 1 mg/dL (ref 0.3–1.2)
Total Protein: 5.7 g/dL — ABNORMAL LOW (ref 6.5–8.1)

## 2018-06-15 LAB — PROTIME-INR
INR: 1.1
Prothrombin Time: 14.1 seconds (ref 11.4–15.2)

## 2018-06-15 LAB — ECHOCARDIOGRAM COMPLETE: Weight: 3313.6 oz

## 2018-06-15 LAB — APTT: aPTT: 32 seconds (ref 24–36)

## 2018-06-15 MED ORDER — ASPIRIN EC 325 MG PO TBEC: 325.0000 mg | DELAYED_RELEASE_TABLET | Freq: Every day | ORAL | Status: DC

## 2018-06-15 MED ORDER — PYRIDOSTIGMINE BROMIDE 60 MG PO TABS
30.0000 mg | ORAL_TABLET | Freq: Three times a day (TID) | ORAL | Status: DC
  Administered 2018-06-15: 30 mg via ORAL
  Filled 2018-06-15 (×2): qty 0.5

## 2018-06-15 MED ORDER — PYRIDOSTIGMINE BROMIDE 60 MG PO TABS: 30.0000 mg | ORAL_TABLET | Freq: Three times a day (TID) | ORAL | 0 refills | Status: DC

## 2018-06-15 MED ORDER — ASPIRIN 325 MG PO TBEC: 325.0000 mg | DELAYED_RELEASE_TABLET | Freq: Every day | ORAL | 0 refills | Status: DC

## 2018-06-15 MED ORDER — ATORVASTATIN CALCIUM 80 MG PO TABS: 80.0000 mg | ORAL_TABLET | Freq: Every day | ORAL | Status: DC

## 2018-06-15 MED ORDER — ATORVASTATIN CALCIUM 80 MG PO TABS: 80.0000 mg | ORAL_TABLET | Freq: Every day | ORAL | 0 refills | Status: DC

## 2018-06-15 NOTE — Progress Notes (Addendum)
NEURO HOSPITALIST PROGRESS NOTE   Subjective: Patient awake, alert, in bed, eyes open, NAD on RA. Denies any difficulty swallowing. Patient stated that in the morning his eye droop seems to be better, but throughout the day the eye lid droops more.  Exam: Vitals:   06/15/18 0343 06/15/18 0909  BP: (!) 136/95 (!) 141/90  Pulse: 63 78  Resp: 18 16  Temp: 97.9 F (36.6 C) (!) 97.5 F (36.4 C)  SpO2: 95% 97%    Physical Exam  HEENT-  Normocephalic, no lesions, without obvious abnormality.  Left eye lid drooping normal  conjunctiva.   Cardiovascular-  pulses palpable throughout   Lungs- no excessive working breathing.  Saturations within normal limits on RA Extremities- Warm, dry and intact Musculoskeletal-no joint tenderness, deformity or swelling Skin-warm and dry, no hyperpigmentation, vitiligo, or suspicious lesions Neuro:  Mental Status: Alert, oriented, thought content appropriate.  Speech fluent without evidence of aphasia.  Able to follow  commands without difficulty. Cranial Nerves: II:  Visual fields grossly normal,  III,IV, VI: ptosis present of left eye lid, extra-ocular motions intact bilaterally pupils  round, reactive to light and accommodation. Left pupil is about 0.5- 51mm smaller than the right eye, and though reactive, it is is slower to react than right pupil.  The left pupil also dilates slower than the right pupil when the lights are turned off. V,VII: smile symmetric, facial light touch sensation normal bilaterally VIII: hearing normal bilaterally; bilateral hearing aids present IX,X: uvula rises symmetrically XI: bilateral shoulder shrug XII: midline tongue extension Motor: Right :  Upper extremity   5/5                                      Left:     Upper extremity   5/5             Lower extremity   5/5                                                  Lower extremity   5/5 Tone and bulk:normal tone throughout; no atrophy noted Sensory:   light touch intact throughout, bilaterally Cerebellar: normal finger-to-nose, normal heel-to-shin test Gait: deferred  Icepack test: POSITIVE    Medications:  Scheduled: . [START ON 06/16/2018] aspirin EC  325 mg Oral Daily  . atorvastatin  80 mg Oral q1800  . B-complex with vitamin C  1 tablet Oral Daily  . calcium-vitamin D  1 tablet Oral Q breakfast  . doxazosin  8 mg Oral Daily  . finasteride  5 mg Oral Daily  . insulin aspart  0-9 Units Subcutaneous TID WC  . multivitamin with minerals  1 tablet Oral Daily  . psyllium  1 packet Oral Daily  Continuous: . sodium chloride 75 mL/hr at 06/15/18 0900  BPZ:WCHENIDPOEUMP **OR** acetaminophen, ondansetron **OR** ondansetron (ZOFRAN) IV, senna-docusate  Pertinent Labs/Diagnostics: Ct Angio Head W/cm &/or Wo Cm  Result Date: 06/14/2018 CLINICAL DATA:  77 year old male with unexplained left eye drooping for 1 week. Abnormal speech onset today. EXAM: CT ANGIOGRAPHY HEAD AND NECK TECHNIQUE: Multidetector CT imaging of the head and neck was performed using  the standard protocol during bolus administration of intravenous contrast. Multiplanar CT image reconstructions and MIPs were obtained to evaluate the vascular anatomy. Carotid stenosis measurements (when applicable) are obtained utilizing NASCET criteria, using the distal internal carotid diameter as the denominator. CONTRAST:  42mL ISOVUE-370 IOPAMIDOL (ISOVUE-370) INJECTION 76% COMPARISON:  Head CT without contrast 1106 hours. [place] Chest CT 10/27/2014. FINDINGS: CTA NECK Skeleton: C2-C3 ankylosis or incomplete segmentation. Possible C3-C4 and developing C4-C5 ankylosis which appears degenerative in nature. No lower cervical or upper thoracic spine ankylosis. No acute osseous abnormality identified. Visualized paranasal sinuses and mastoids are stable and well pneumatized. Upper chest: Mild upper lung atelectasis with suspicion of superimposed centrilobular emphysema. Small non  dependent 15 millimeter area of ground-glass opacity in the right upper lobe on series 5, image 179. this is probably stable since 2015 when allowing for different lung volumes today (series 3, image 10 at that time). No superior mediastinal lymphadenopathy. Other neck: Negative. Aortic arch: 4 vessel arch configuration, the left vertebral artery arises directly from the arch. Distal arch ectasia up to 33 millimeters diameter which appears to continue in the visible proximal descending aorta. Minimal arch atherosclerosis. Right carotid system: Negative aside from tortuosity. No atherosclerosis. Left carotid system: Tortuosity but otherwise negative. Minimal calcified plaque along the medial left carotid bifurcation. Vertebral arteries: Normal proximal right subclavian artery and right vertebral artery origin. Mildly tortuous right V1 segment. The right vertebral artery appears mildly dominant and is patent to the skull base without stenosis. The left vertebral artery arises directly from the arch and is mildly non dominant. Mildly late entry into the cervical transverse foramen. Patent left vertebral to the skull base without stenosis. CTA HEAD Posterior circulation: Mildly tortuous distal vertebral arteries without stenosis. The right is dominant. Normal PICA origins. Patent, mildly dolichoectatic vertebrobasilar junction. Tortuous basilar artery without stenosis. Patent SCA origins. Normal PCA origins. Posterior communicating arteries are diminutive or absent. Mild right P1 tortuosity. PCA branches are within normal limits. Anterior circulation: Patent ICA siphons with mild tortuosity and minimal atherosclerosis. No ICA stenosis. Normal ophthalmic artery origins. Patent carotid termini. Normal MCA and ACA origins. The right A1 is slightly dominant. Anterior communicating artery and bilateral ACA branches are normal. Left MCA M1 segment, bifurcation, and left MCA branches are within normal limits. Right MCA M1  segment, trifurcation, and right MCA branches are within normal limits. Venous sinuses: Patent on the delayed images. Anatomic variants: Left vertebral artery arises directly from the arch. Right vertebral is dominant. Right ACA is slightly dominant. Delayed phase: No abnormal enhancement identified. Gray-white matter differentiation is stable from earlier today and within normal limits. Review of the MIP images confirms the above findings IMPRESSION: 1. Negative for large vessel occlusion, dissection, or arterial stenosis in the head or neck. There is minimal atherosclerosis. 2. Positive for generalized arterial tortuosity. Ectatic distal aortic arch and proximal descending aorta. 3. A small 15 mm area of ground-glass opacity in the right upper lobe appears stable since 2015. 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 Electronically Signed   By: Genevie Ann M.D.   On: 06/14/2018 14:08   Ct Head Wo Contrast  Result Date: 06/14/2018 CLINICAL DATA:  Left eye drooping 1 week. Negative Horner's syndrome workup. EXAM: CT HEAD WITHOUT CONTRAST TECHNIQUE: Contiguous axial images were obtained from the base of the skull through the vertex without intravenous contrast. COMPARISON:  None. FINDINGS: Brain: No evidence of acute infarction, hemorrhage, hydrocephalus, extra-axial collection or mass lesion/mass effect. Vascular: No hyperdense vessel or  unexpected calcification. Skull: Normal. Negative for fracture or focal lesion. Sinuses/Orbits: No acute finding. Other: None. IMPRESSION: Normal head CT. Electronically Signed   By: Marin Olp M.D.   On: 06/14/2018 11:27   Ct Angio Neck W And/or Wo Contrast  Result Date: 06/14/2018 CLINICAL DATA:  77 year old male with unexplained left eye drooping for 1 week. Abnormal speech onset today. EXAM: CT ANGIOGRAPHY HEAD AND NECK TECHNIQUE: Multidetector CT imaging of the head and neck was performed using the standard protocol during bolus administration of  intravenous contrast. Multiplanar CT image reconstructions and MIPs were obtained to evaluate the vascular anatomy. Carotid stenosis measurements (when applicable) are obtained utilizing NASCET criteria, using the distal internal carotid diameter as the denominator. CONTRAST:  54mL ISOVUE-370 IOPAMIDOL (ISOVUE-370) INJECTION 76% COMPARISON:  Head CT without contrast 1106 hours. [place] Chest CT 10/27/2014. FINDINGS: CTA NECK Skeleton: C2-C3 ankylosis or incomplete segmentation. Possible C3-C4 and developing C4-C5 ankylosis which appears degenerative in nature. No lower cervical or upper thoracic spine ankylosis. No acute osseous abnormality identified. Visualized paranasal sinuses and mastoids are stable and well pneumatized. Upper chest: Mild upper lung atelectasis with suspicion of superimposed centrilobular emphysema. Small non dependent 15 millimeter area of ground-glass opacity in the right upper lobe on series 5, image 179. this is probably stable since 2015 when allowing for different lung volumes today (series 3, image 10 at that time). No superior mediastinal lymphadenopathy. Other neck: Negative. Aortic arch: 4 vessel arch configuration, the left vertebral artery arises directly from the arch. Distal arch ectasia up to 33 millimeters diameter which appears to continue in the visible proximal descending aorta. Minimal arch atherosclerosis. Right carotid system: Negative aside from tortuosity. No atherosclerosis. Left carotid system: Tortuosity but otherwise negative. Minimal calcified plaque along the medial left carotid bifurcation. Vertebral arteries: Normal proximal right subclavian artery and right vertebral artery origin. Mildly tortuous right V1 segment. The right vertebral artery appears mildly dominant and is patent to the skull base without stenosis. The left vertebral artery arises directly from the arch and is mildly non dominant. Mildly late entry into the cervical transverse foramen.  Patent left vertebral to the skull base without stenosis. CTA HEAD Posterior circulation: Mildly tortuous distal vertebral arteries without stenosis. The right is dominant. Normal PICA origins. Patent, mildly dolichoectatic vertebrobasilar junction. Tortuous basilar artery without stenosis. Patent SCA origins. Normal PCA origins. Posterior communicating arteries are diminutive or absent. Mild right P1 tortuosity. PCA branches are within normal limits. Anterior circulation: Patent ICA siphons with mild tortuosity and minimal atherosclerosis. No ICA stenosis. Normal ophthalmic artery origins. Patent carotid termini. Normal MCA and ACA origins. The right A1 is slightly dominant. Anterior communicating artery and bilateral ACA branches are normal. Left MCA M1 segment, bifurcation, and left MCA branches are within normal limits. Right MCA M1 segment, trifurcation, and right MCA branches are within normal limits. Venous sinuses: Patent on the delayed images. Anatomic variants: Left vertebral artery arises directly from the arch. Right vertebral is dominant. Right ACA is slightly dominant. Delayed phase: No abnormal enhancement identified. Gray-white matter differentiation is stable from earlier today and within normal limits. Review of the MIP images confirms the above findings IMPRESSION: 1. Negative for large vessel occlusion, dissection, or arterial stenosis in the head or neck. There is minimal atherosclerosis. 2. Positive for generalized arterial tortuosity. Ectatic distal aortic arch and proximal descending aorta. 3. A small 15 mm area of ground-glass opacity in the right upper lobe appears stable since 2015. 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  Electronically Signed   By: Genevie Ann M.D.   On: 06/14/2018 14:08   Mr Brain Wo Contrast  Result Date: 06/14/2018 CLINICAL DATA:  Stroke follow-up.  Left facial droop EXAM: MRI HEAD WITHOUT CONTRAST TECHNIQUE: Multiplanar, multiecho pulse sequences  of the brain and surrounding structures were obtained without intravenous contrast. COMPARISON:  CT head 06/14/2018 FINDINGS: Brain: Ventricle size and cerebral volume normal. Negative for acute or chronic infarct. Chronic microhemorrhage left frontal and left occipital parietal white matter. Negative for mass or fluid collection. Vascular: Normal arterial flow voids. Skull and upper cervical spine: Negative Sinuses/Orbits: Mild mucosal edema paranasal sinuses.  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 Electronically Signed   By: Franchot Gallo M.D.   On: 06/14/2018 15:36    Assessment: Patient is a 77 year old man with PMH significant for HLD, dysrhythmia (s/p ablation 2010), presents to [facility] ED for 1 week history of left eye drooping. CT head was normal.  On examination, he has evidence of Horner syndrome.  Also has a history of possible left facial droop and dysarthria-making stroke at the top of differential.  Does have a history of cardiac dysrhythmia.  Brain imaging with MRI and CT Angio of head and neck essentially completely unremarkable for dissection or carotid abnormality. Stroke still is a a top differential, but the lack of any imaging to support a vascular cause makes me wonder if this is indeed neuromuscular.  It would be unusual to have unilateral ptosis with a neuromuscular disorder but it is not impossible.  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  Recommendations:  -Myasthenia Gravis labs (Ach: binding, blocking, modulating, MUSK, ordered) -Mestinon 30 TID -Telemetry -2D echo - pending -Respiratory for NIF/VC baseline (once) -Frequent neurochecks -Physical therapy, occupational therapy, speech  therapy. -Aspirin 325 mg -Atorvastatin 80 mg Outpatient neurology follow-up with Dr. Narda Amber at [facility] neurology  Plan was relayed to Dr. Verlon Au in person. Please call if the 2D echo reveals any concerning findings.  Laurey Morale, MSN, NP-C [facility] [phone]  Attending neurologist's note to follow Attending Neurohospitalist Addendum Patient seen and examined with APP/Resident. Agree with the history and physical as documented above. Agree with the plan as documented, which I helped formulate. I have independently reviewed the chart, obtained history, review of systems and examined the patient.I have personally reviewed pertinent head/neck/spine imaging (CT/MRI). Please feel free to call with any questions. --- Amie Portland, MD [facility] Pager: [phone]  If 7pm to 7am, please call on call as listed on AMION.

## 2018-06-15 NOTE — Progress Notes (Signed)
*  PRELIMINARY RESULTS* Echocardiogram 2D Echocardiogram has been performed.  Leavy Cella 06/15/2018, 3:05 PM

## 2018-06-15 NOTE — Discharge Summary (Signed)
Physician Discharge Summary  [name] QMG:[id number] DOB: [birth year] DOA: 06/14/2018  PCP: Hulan Fess, MD  Admit date: 06/14/2018 Discharge date: 06/15/2018  Time spent: 25 minutes  Recommendations for Outpatient Follow-up:  1. Follow-up with Dr. 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 Will need close monitoring in the outpatient setting but does not need any other modalities and has been cleared by therapy 3. Please follow the following labs as an outpatient-Myasthenia Gravis labs (Ach: binding, blocking, modulating, MUSK, ordered) 4. Note addition of aspirin atorvastatin to meds this admission  Discharge Diagnoses:  Principal Problem:   Facial droop Active Problems:   COPD GOLD II   Idiopathic polyneuropathy   BPH (benign prostatic hyperplasia)   HTN (hypertension)   Discharge Condition: Proved  Diet recommendation: Heart healthy  Filed Weights   06/14/18 0949 06/15/18 0500  Weight: 90.7 kg 93.9 kg    History of present illness:  77 year old male with hyperlipidemia prior smoker and SVT status post ablation 2010, osteoarthritis previously on gabapentin, BPH on meds presented with unilateral left-sided-saw an ophthalmologist who 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 [name] was CCed on this discharge summary to coordinate close outpatient follow-up   Discharge Exam: Vitals:    06/15/18 1237 06/15/18 1707  BP: 132/81 (!) 152/97  Pulse: 87 71  Resp: 20 15  Temp: (!) 97.3 F (36.3 C) 98.1 F (36.7 C)  SpO2: 96% 96%    General: Wake alert ptosis to left eye, no icterus no pallor no strabismus cannot appreciate saccades Cardiovascular: S1, S2 2 no murmur Respiratory: Clinically no added sound Power 5/5 sitting up in the bed Reflexes deferred  Discharge Instructions   Discharge Instructions    Diet - low sodium heart healthy   Complete by:  As directed    Discharge instructions   Complete by:  As directed    Please follow-up with outpatient neurologist Dr. Unice Bailey would continue the Mestinon as described by the neurologist in the hospital and you will need follow-up of the labs that were drawn in the hospital which usually takes a couple of days nonetheless we feel it is important that you continue taking these meds for the time being Please report any further significant deficits outside of what you currently have primary care physician who can coordinate things with neurologist You will notice that your aspirin dose has increased as has your cholesterol medication dose   Increase activity slowly   Complete by:  As directed      Allergies as of 06/15/2018      Reactions   Pollen Extract Other (See Comments)   Nasal congestion      Medication List    STOP taking these medications   diphenhydrAMINE 25 MG tablet Commonly known as:  BENADRYL     TAKE these medications   aspirin 325 MG EC tablet Take 1 tablet (325 mg total) by mouth daily. Start taking on:  06/16/2018 What changed:    medication strength  how much to take   atorvastatin 80 MG tablet Commonly known as:  LIPITOR Take 1 tablet (80 mg total) by mouth daily at 6 PM. What changed:    medication strength  how much to take  when to take this   CALCIUM 600+D 600-400 MG-UNIT tablet Generic drug:  Calcium Carbonate-Vitamin D Take 1 tablet by mouth daily.   doxazosin 8 MG  tablet Commonly known as:  CARDURA Take 8 mg by mouth daily.   finasteride 5 MG tablet Commonly known as:  PROSCAR Take 5 mg by mouth daily.   Glucosamine HCl 1500 MG Tabs Take 1,500 mg by mouth daily.   ibuprofen 200 MG tablet Commonly known as:  ADVIL,MOTRIN Take 200 mg by mouth every 6 (six) hours as needed for headache (pain).   METAMUCIL PO Take 15 mLs by mouth daily. Mix in 8 oz water and drink   multivitamin with minerals Tabs tablet Take 1 tablet by mouth daily.   OVER THE COUNTER MEDICATION Place 1 drop into both eyes daily as needed (itching/dry eyes). Lubricant eye drop   pyridostigmine 60 MG tablet Commonly known as:  MESTINON Take 0.5 tablets (30 mg total) by mouth every 8 (eight) hours.   VITAMIN B-12 PO Take 1 tablet by mouth daily.      Allergies  Allergen Reactions  . Pollen Extract Other (See Comments)    Nasal congestion      The results of significant diagnostics from this hospitalization (including imaging, microbiology, ancillary and laboratory) are listed below for reference.    Significant Diagnostic Studies: Ct Angio Head W/cm &/or Wo Cm  Result Date: 06/14/2018 CLINICAL DATA:  77 year old male with unexplained left eye drooping for 1 week. Abnormal speech onset today. EXAM: CT ANGIOGRAPHY HEAD AND NECK TECHNIQUE: Multidetector CT imaging of the head and neck was performed using the standard protocol during bolus administration of intravenous contrast. Multiplanar CT image reconstructions and MIPs were obtained to evaluate the vascular anatomy. Carotid stenosis measurements (when applicable) are obtained utilizing NASCET criteria, using the distal internal carotid diameter as the denominator. CONTRAST:  28mL ISOVUE-370 IOPAMIDOL (ISOVUE-370) INJECTION 76% COMPARISON:  Head CT without contrast 1106 hours. [place] Chest CT 10/27/2014. FINDINGS: CTA NECK Skeleton: C2-C3 ankylosis or incomplete segmentation. Possible C3-C4 and developing  C4-C5 ankylosis which appears degenerative in nature. No lower cervical or upper thoracic spine ankylosis. No acute osseous abnormality identified. Visualized paranasal sinuses and mastoids are stable and well pneumatized. Upper chest: Mild upper lung atelectasis with suspicion of superimposed centrilobular emphysema. Small non dependent 15 millimeter area of ground-glass opacity in the right upper lobe on series 5, image 179. this is probably stable since 2015 when allowing for different lung volumes today (series 3, image 10 at that time). No superior mediastinal lymphadenopathy. Other neck: Negative. Aortic arch: 4 vessel arch configuration, the left vertebral artery arises directly from the arch. Distal arch ectasia up to 33 millimeters diameter which appears to continue in the visible proximal descending aorta. Minimal arch atherosclerosis. Right carotid system: Negative aside from tortuosity. No atherosclerosis. Left carotid system: Tortuosity but otherwise negative. Minimal calcified plaque along the medial left carotid bifurcation. Vertebral arteries: Normal proximal right subclavian artery and right vertebral artery origin. Mildly tortuous right V1 segment. The right vertebral artery appears mildly dominant and is patent to the skull base without stenosis. The left vertebral artery arises directly from the arch and is mildly non dominant. Mildly late entry into the cervical transverse foramen. Patent left vertebral  to the skull base without stenosis. CTA HEAD Posterior circulation: Mildly tortuous distal vertebral arteries without stenosis. The right is dominant. Normal PICA origins. Patent, mildly dolichoectatic vertebrobasilar junction. Tortuous basilar artery without stenosis. Patent SCA origins. Normal PCA origins. Posterior communicating arteries are diminutive or absent. Mild right P1 tortuosity. PCA branches are within normal limits. Anterior circulation: Patent ICA siphons with mild tortuosity and  minimal atherosclerosis. No ICA stenosis. Normal ophthalmic artery origins. Patent carotid termini. Normal MCA and ACA origins. The right A1 is slightly dominant. Anterior communicating artery and bilateral ACA branches are normal. Left MCA M1 segment, bifurcation, and left MCA branches are within normal limits. Right MCA M1 segment, trifurcation, and right MCA branches are within normal limits. Venous sinuses: Patent on the delayed images. Anatomic variants: Left vertebral artery arises directly from the arch. Right vertebral is dominant. Right ACA is slightly dominant. Delayed phase: No abnormal enhancement identified. Gray-white matter differentiation is stable from earlier today and within normal limits. Review of the MIP images confirms the above findings IMPRESSION: 1. Negative for large vessel occlusion, dissection, or arterial stenosis in the head or neck. There is minimal atherosclerosis. 2. Positive for generalized arterial tortuosity. Ectatic distal aortic arch and proximal descending aorta. 3. A small 15 mm area of ground-glass opacity in the right upper lobe appears stable since 2015. 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 Electronically Signed   By: Genevie Ann M.D.   On: 06/14/2018 14:08   Ct Head Wo Contrast  Result Date: 06/14/2018 CLINICAL DATA:  Left eye drooping 1 week. Negative Horner's syndrome workup. EXAM: CT HEAD WITHOUT CONTRAST TECHNIQUE: Contiguous axial images were obtained from the base of the skull through the vertex without intravenous contrast. COMPARISON:  None. FINDINGS: Brain: No evidence of acute infarction, hemorrhage, hydrocephalus, extra-axial collection or mass lesion/mass effect. Vascular: No hyperdense vessel or unexpected calcification. Skull: Normal. Negative for fracture or focal lesion. Sinuses/Orbits: No acute finding. Other: None. IMPRESSION: Normal head CT. Electronically Signed   By: Marin Olp M.D.   On: 06/14/2018 11:27   Ct Angio Neck W  And/or Wo Contrast  Result Date: 06/14/2018 CLINICAL DATA:  77 year old male with unexplained left eye drooping for 1 week. Abnormal speech onset today. EXAM: CT ANGIOGRAPHY HEAD AND NECK TECHNIQUE: Multidetector CT imaging of the head and neck was performed using the standard protocol during bolus administration of intravenous contrast. Multiplanar CT image reconstructions and MIPs were obtained to evaluate the vascular anatomy. Carotid stenosis measurements (when applicable) are obtained utilizing NASCET criteria, using the distal internal carotid diameter as the denominator. CONTRAST:  29mL ISOVUE-370 IOPAMIDOL (ISOVUE-370) INJECTION 76% COMPARISON:  Head CT without contrast 1106 hours. [place] Chest CT 10/27/2014. FINDINGS: CTA NECK Skeleton: C2-C3 ankylosis or incomplete segmentation. Possible C3-C4 and developing C4-C5 ankylosis which appears degenerative in nature. No lower cervical or upper thoracic spine ankylosis. No acute osseous abnormality identified. Visualized paranasal sinuses and mastoids are stable and well pneumatized. Upper chest: Mild upper lung atelectasis with suspicion of superimposed centrilobular emphysema. Small non dependent 15 millimeter area of ground-glass opacity in the right upper lobe on series 5, image 179. this is probably stable since 2015 when allowing for different lung volumes today (series 3, image 10 at that time). No superior mediastinal lymphadenopathy. Other neck: Negative. Aortic arch: 4 vessel arch configuration, the left vertebral artery arises directly from the arch. Distal arch ectasia up to 33 millimeters diameter which appears to continue in the visible proximal descending aorta. Minimal arch atherosclerosis. Right  carotid system: Negative aside from tortuosity. No atherosclerosis. Left carotid system: Tortuosity but otherwise negative. Minimal calcified plaque along the medial left carotid bifurcation. Vertebral arteries: Normal proximal right  subclavian artery and right vertebral artery origin. Mildly tortuous right V1 segment. The right vertebral artery appears mildly dominant and is patent to the skull base without stenosis. The left vertebral artery arises directly from the arch and is mildly non dominant. Mildly late entry into the cervical transverse foramen. Patent left vertebral to the skull base without stenosis. CTA HEAD Posterior circulation: Mildly tortuous distal vertebral arteries without stenosis. The right is dominant. Normal PICA origins. Patent, mildly dolichoectatic vertebrobasilar junction. Tortuous basilar artery without stenosis. Patent SCA origins. Normal PCA origins. Posterior communicating arteries are diminutive or absent. Mild right P1 tortuosity. PCA branches are within normal limits. Anterior circulation: Patent ICA siphons with mild tortuosity and minimal atherosclerosis. No ICA stenosis. Normal ophthalmic artery origins. Patent carotid termini. Normal MCA and ACA origins. The right A1 is slightly dominant. Anterior communicating artery and bilateral ACA branches are normal. Left MCA M1 segment, bifurcation, and left MCA branches are within normal limits. Right MCA M1 segment, trifurcation, and right MCA branches are within normal limits. Venous sinuses: Patent on the delayed images. Anatomic variants: Left vertebral artery arises directly from the arch. Right vertebral is dominant. Right ACA is slightly dominant. Delayed phase: No abnormal enhancement identified. Gray-white matter differentiation is stable from earlier today and within normal limits. Review of the MIP images confirms the above findings IMPRESSION: 1. Negative for large vessel occlusion, dissection, or arterial stenosis in the head or neck. There is minimal atherosclerosis. 2. Positive for generalized arterial tortuosity. Ectatic distal aortic arch and proximal descending aorta. 3. A small 15 mm area of ground-glass opacity in the right upper lobe appears  stable since 2015. 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 Electronically Signed   By: Genevie Ann M.D.   On: 06/14/2018 14:08   Mr Brain Wo Contrast  Result Date: 06/14/2018 CLINICAL DATA:  Stroke follow-up.  Left facial droop EXAM: MRI HEAD WITHOUT CONTRAST TECHNIQUE: Multiplanar, multiecho pulse sequences of the brain and surrounding structures were obtained without intravenous contrast. COMPARISON:  CT head 06/14/2018 FINDINGS: Brain: Ventricle size and cerebral volume normal. Negative for acute or chronic infarct. Chronic microhemorrhage left frontal and left occipital parietal white matter. Negative for mass or fluid collection. Vascular: Normal arterial flow voids. Skull and upper cervical spine: Negative Sinuses/Orbits: Mild mucosal edema paranasal sinuses.  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 Electronically Signed   By: Franchot Gallo M.D.   On: 06/14/2018 15:36    Microbiology: No results found for this or any previous visit (from the past 240 hour(s)).   Labs: Basic Metabolic Panel: Recent Labs  Lab 06/14/18 0953 06/14/18 1000 06/15/18 0307  NA 136 144 144  K 4.0 4.1 3.8  CL 103 103 109  CO2 25  --  29  GLUCOSE 159* 133* 103*  BUN 17 15 11   CREATININE 1.19 1.00 0.95  CALCIUM 9.0  --  8.7*   Liver Function Tests: Recent Labs  Lab 06/14/18 0953 06/15/18 0307  AST 23 17  ALT 26 18  ALKPHOS 52 47  BILITOT 1.1 1.0  PROT 7.2 5.7*  ALBUMIN 4.1 3.3*   No results for input(s): LIPASE, AMYLASE in the last 168 hours. No results for input(s): AMMONIA in the last 168  hours. CBC: Recent Labs  Lab 06/14/18 0953 06/14/18 1000 06/15/18 0307  WBC 5.6  --  5.5  NEUTROABS 3.1  --   --   HGB 14.3 16.0 14.2  HCT 43.1 47.0 44.2  MCV 93.5  --  90.6  PLT 241  --  218   Cardiac Enzymes: No results for input(s): CKTOTAL, CKMB,  CKMBINDEX, TROPONINI in the last 168 hours. BNP: BNP (last 3 results) No results for input(s): BNP in the last 8760 hours.  ProBNP (last 3 results) No results for input(s): PROBNP in the last 8760 hours.  CBG: Recent Labs  Lab 06/14/18 1650 06/14/18 2208 06/15/18 0549 06/15/18 1123 06/15/18 1636  GLUCAP 109* 104* 88 103* 89       Signed:  Nita Sells MD   [facility] 06/15/2018, 5:09 PM

## 2018-06-15 NOTE — Progress Notes (Signed)
D/C instructions provided to patient and spouse, denies questions/concerns at this time. Patient transported to front entrance via WC, tol well. 

## 2018-06-15 NOTE — Progress Notes (Signed)
SLP Cancellation Note  Patient Details Name: [name] MRN: [id number] DOB: Jul 17, 1941   Cancelled treatment:       Reason Eval/Treat Not Completed: SLP screened, no needs identified, will sign off   Sonia Baller, MA, CCC-SLP 06/15/18 1:26 PM

## 2018-06-15 NOTE — Progress Notes (Signed)
OT Cancellation Note  Patient Details Name: [name] MRN: [id number] DOB: Aug 16, 1941   Cancelled Treatment:    Reason Eval/Treat Not Completed: OT screened, no needs identified, will sign off.  MRI negative for acute changes.  Pt at baseline per PT.  Will sign off.  [place], OTR/L [phone]   Lucille Passy M 06/15/2018, 10:58 AM

## 2018-06-15 NOTE — Evaluation (Signed)
Physical Therapy Evaluation and Discharge Patient Details Name: [name] MRN: [id number] DOB: [birth year] Today's Date: 06/15/2018   History of Present Illness  Came to ED 06/14/18 with lt ptosis and facial droop. MRI negative for acute changes; chronic left frontall-occipital-parietal ischemia. 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 Patient able to single leg stand >10 seconds, gait WNL. He has mild balance deficits with eyes closed due to neuropathy, but able to maintain his balance. All education has been completed and the patient has no further questions. PT is signing off. Thank you for this referral.     Follow Up Recommendations No PT follow up    Equipment Recommendations  None recommended by PT    Recommendations for Other Services       Precautions / Restrictions Precautions Precautions: None Restrictions Weight Bearing Restrictions: No      Mobility  Bed Mobility Overal bed mobility: Independent                Transfers Overall transfer level: Independent                  Ambulation/Gait Ambulation/Gait assistance: Independent Gait Distance (Feet): 150 Feet Assistive device: None Gait Pattern/deviations: WFL(Within Functional Limits)     General Gait Details: Able to turn 180, vary speed; no deficits noted  Stairs            Wheelchair Mobility    Modified Rankin (Stroke Patients Only) Modified Rankin (Stroke Patients Only) Pre-Morbid Rankin Score: No symptoms Modified Rankin: No significant disability     Balance Overall balance assessment: Independent(SLS 10 seconds; rhomberg EC 10 sec (imbalance 2/2 neuropathy)                                           Pertinent Vitals/Pain Pain Assessment: No/denies pain    Home Living Family/patient expects to be discharged to:: Private  residence Living Arrangements: Spouse/significant other Available Help at Discharge: Family Type of Home: House Home Access: Stairs to enter Entrance Stairs-Rails: Right Entrance Stairs-Number of Steps: 13 Home Layout: Multi-level(walkout garage in basement) Home Equipment: Crutches;Cane - single point      Prior Function Level of Independence: Independent               Hand Dominance        Extremity/Trunk Assessment   Upper Extremity Assessment Upper Extremity Assessment: Overall WFL for tasks assessed    Lower Extremity Assessment Lower Extremity Assessment: Overall WFL for tasks assessed    Cervical / Trunk Assessment Cervical / Trunk Assessment: Normal  Communication   Communication: No difficulties  Cognition Arousal/Alertness: Awake/alert Behavior During Therapy: WFL for tasks assessed/performed Overall Cognitive Status: Within Functional Limits for tasks assessed                                        General Comments      Exercises     Assessment/Plan    PT Assessment Patent does not need any further PT services  PT Problem List         PT Treatment Interventions      PT Goals (Current goals can be found in the Care Plan section)  Acute  Rehab PT Goals PT Goal Formulation: All assessment and education complete, DC therapy    Frequency     Barriers to discharge        Co-evaluation               AM-PAC PT "6 Clicks" Daily Activity  Outcome Measure Difficulty turning over in bed (including adjusting bedclothes, sheets and blankets)?: None Difficulty moving from lying on back to sitting on the side of the bed? : None Difficulty sitting down on and standing up from a chair with arms (e.g., wheelchair, bedside commode, etc,.)?: None Help needed moving to and from a bed to chair (including a wheelchair)?: None Help needed walking in hospital room?: None Help needed climbing 3-5 steps with a railing? : None 6 Click  Score: 24    End of Session   Activity Tolerance: Patient tolerated treatment well Patient left: in chair;with call bell/phone within reach   PT Visit Diagnosis: Other symptoms and signs involving the nervous system (Y69.485)    Time: 4627-0350 PT Time Calculation (min) (ACUTE ONLY): 16 min   Charges:   PT Evaluation $PT Eval Low Complexity: 1 Low            [employer], PT 06/15/2018, 10:38 AM

## 2018-06-15 NOTE — Progress Notes (Signed)
RT note- NIF -40                FVC 2.3L Very good effort

## 2018-06-16 DIAGNOSIS — R972 Elevated prostate specific antigen [PSA]: Secondary | ICD-10-CM | POA: Diagnosis not present

## 2018-06-16 LAB — FOLATE RBC
Folate, Hemolysate: >620 ng/mL
Folate, RBC: >1328 ng/mL (ref >498)
Hematocrit: 46.7 % (ref 37.5–51.0)

## 2018-06-17 ENCOUNTER — Encounter: Payer: Self-pay | Admitting: Neurology

## 2018-06-17 DIAGNOSIS — H532 Diplopia: Secondary | ICD-10-CM | POA: Diagnosis not present

## 2018-06-17 DIAGNOSIS — G7000 Myasthenia gravis without (acute) exacerbation: Secondary | ICD-10-CM | POA: Diagnosis not present

## 2018-06-17 DIAGNOSIS — H5702 Anisocoria: Secondary | ICD-10-CM | POA: Diagnosis not present

## 2018-06-17 LAB — ACETYLCHOLINE RECEPTOR, BINDING: Acety choline binding ab: 1.69 nmol/L — ABNORMAL HIGH (ref 0.00–0.24)

## 2018-06-20 LAB — MISC LABCORP TEST (SEND OUT): Labcorp test code: 504600

## 2018-06-23 DIAGNOSIS — R351 Nocturia: Secondary | ICD-10-CM | POA: Diagnosis not present

## 2018-06-23 DIAGNOSIS — Z87442 Personal history of urinary calculi: Secondary | ICD-10-CM | POA: Diagnosis not present

## 2018-06-23 DIAGNOSIS — N401 Enlarged prostate with lower urinary tract symptoms: Secondary | ICD-10-CM | POA: Diagnosis not present

## 2018-06-23 DIAGNOSIS — R972 Elevated prostate specific antigen [PSA]: Secondary | ICD-10-CM | POA: Diagnosis not present

## 2018-07-02 ENCOUNTER — Ambulatory Visit (INDEPENDENT_AMBULATORY_CARE_PROVIDER_SITE_OTHER): Payer: Medicare Other | Admitting: Neurology

## 2018-07-02 ENCOUNTER — Encounter: Payer: Self-pay | Admitting: Neurology

## 2018-07-02 VITALS — BP 124/80 | HR 78 | Ht 72.0 in | Wt 203.4 lb

## 2018-07-02 DIAGNOSIS — M542 Cervicalgia: Secondary | ICD-10-CM

## 2018-07-02 DIAGNOSIS — G7001 Myasthenia gravis with (acute) exacerbation: Secondary | ICD-10-CM | POA: Insufficient documentation

## 2018-07-02 MED ORDER — PYRIDOSTIGMINE BROMIDE 60 MG PO TABS: ORAL_TABLET | ORAL | 5 refills | Status: DC

## 2018-07-02 MED ORDER — PREDNISONE 20 MG PO TABS: 20.0000 mg | ORAL_TABLET | Freq: Every day | ORAL | 3 refills | Status: DC

## 2018-07-02 NOTE — Progress Notes (Signed)
[facility] Clinic Note - Initial Visit   Date: 07/02/18  [name] MRN: [id number] DOB: 25-Jun-1941   Dear Dr. Rex Kras:  Thank you for your kind referral of [name] for consultation of myasthenia gravis. Although his history is well known to you, please allow [place] to reiterate it for the purpose of our medical record. The patient was accompanied to the clinic by wife who also provides collateral information.     History of Present Illness: [name] is a 77 y.o. 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  In mid-August, his wife noticed that his left eye started to appear droopy and gradually got worse.  Over the next few days, he also noticed that his lower lip felt "loose".  In the morning, his eye is usually open, but as the day progresses, the droopiness get worse to where it is completely shut.  He also has double vision with images on top of each other and is worse with upgaze.  If he closes one eye, double vision is resolved. 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  He then went to the ER where stroke was excluded.  He was seen by Dr. Amie Portland and due to positive ice pack test, AChR antibodies were checked and pending at discharge.  He was started on mestinon 30mg  three times daily (8am, 4pm, and midnight) and has not noticed much change.  His AChR binding antibodies have resulted positive and he is here to establish care for myasthenia.   He denies difficulty swallowing, talking, or shortness of breath.  He denies weakness of the arms or legs.    He also complains of severe neck pain which is worse with prolonged standing and neck extension.  He usually has to sit back down and rest for relief. He does not have numbness/tingling of the hands or weakness.   He has  previously been evaluated for neuropathy at [facility] with NCS/EMG which showed peripheral sensorimotor axonal neuropathy.  CSF testing was normal and genetic testing was normal.    Out-side paper records, electronic medical record, and images have been reviewed where available and summarized as:  LAbs 06/15/2018:  AChR binding 1.69*, MUSK neg  MRI brain wo contrast 06/14/2018: Negative for acute infarct. 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  CTA head and neck 06/14/2018: 1. Negative for large vessel occlusion, dissection, or arterial stenosis in the head or neck. There is minimal atherosclerosis. 2. Positive for generalized arterial tortuosity. Ectatic distal aortic arch and proximal descending aorta. 3. A small 15 mm area of ground-glass opacity in the right upper lobe appears stable since 2015. 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  NCS/EMG of the legs 03/13/2018:  This is an abnormal study. 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   Lab Results  Component Value Date   TSH 1.548 06/14/2018   Lab Results  Component Value Date   HGBA1C 5.3 06/14/2018     Past Medical History:  Diagnosis Date  . Colon polyp   . Diverticulosis    extensive 3'15.  . Dysrhythmia    hx. Ablation for tachycardia, no routine cardiology visits now  . ED (erectile dysfunction)   . Heart murmur    teenager  . History of colon polyps   . History of  kidney stones    x1 passed  . Hyperlipidemia   . 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 Osteoarthritis   . Palpitation    with svt rate 150  . Tendinitis    achiles heel spur  . Testicular atrophy   . Tubular adenoma of colon     Past Surgical History:  Procedure Laterality Date  . ABLATION  2010   cardiac   . COLONOSCOPY    . EUS N/A 11/17/2014   Procedure:  ESOPHAGEAL ENDOSCOPIC ULTRASOUND (EUS) RADIAL;  Surgeon: [name], MD;  Location: [facility];  Service: Endoscopy;  Laterality: N/A;  . FINE NEEDLE ASPIRATION N/A 11/17/2014   Procedure: FINE NEEDLE ASPIRATION (FNA) LINEAR;  Surgeon: [name], MD;  Location: [facility];  Service: Endoscopy;  Laterality: N/A;  + or- fna  . FLEXIBLE SIGMOIDOSCOPY    . HEMORRHOID SURGERY     banding   . HERNIA REPAIR Bilateral    '05 bilateral hernia  . KNEE ARTHROSCOPY Left    scope '08  . KNEE ARTHROSCOPY    . LAPAROSCOPIC APPENDECTOMY N/A 09/05/2014   Procedure: APPENDECTOMY LAPAROSCOPIC;  Surgeon: [name], MD;  Location: [facility];  Service: General;  Laterality: N/A;  . ROTATOR CUFF REPAIR Right 2016     Medications:  Outpatient Encounter Medications as of 07/02/2018  Medication Sig  . aspirin EC 325 MG EC tablet Take 1 tablet (325 mg total) by mouth daily.  Marland Kitchen atorvastatin (LIPITOR) 80 MG tablet Take 1 tablet (80 mg total) by mouth daily at 6 PM.  . Calcium Carbonate-Vitamin D (CALCIUM 600+D) 600-400 MG-UNIT per tablet Take 1 tablet by mouth daily.    . Cyanocobalamin (VITAMIN B-12 PO) Take 1 tablet by mouth daily.  Marland Kitchen doxazosin (CARDURA) 8 MG tablet Take 8 mg by mouth daily.  . finasteride (PROSCAR) 5 MG tablet Take 5 mg by mouth daily.   . Glucosamine HCl 1500 MG TABS Take 1,500 mg by mouth daily.  Marland Kitchen ibuprofen (ADVIL,MOTRIN) 200 MG tablet Take 200 mg by mouth every 6 (six) hours as needed for headache (pain).  . Multiple Vitamin (MULTIVITAMIN WITH MINERALS) TABS tablet Take 1 tablet by mouth daily.  Marland Kitchen OVER THE COUNTER MEDICATION Place 1 drop into both eyes daily as needed (itching/dry eyes). Lubricant eye drop  . Psyllium (METAMUCIL PO) Take 15 mLs by mouth daily. Mix in 8 oz water and drink  . pyridostigmine (MESTINON) 60 MG tablet Take 1 tablet at 8am, 1pm, and 6pm  . [DISCONTINUED] pyridostigmine (MESTINON) 60 MG tablet Take 0.5 tablets (30 mg total) by mouth every 8 (eight)  hours.  . predniSONE (DELTASONE) 20 MG tablet Take 1 tablet (20 mg total) by mouth daily.   No facility-administered encounter medications on file as of 07/02/2018.      Allergies:  Allergies  Allergen Reactions  . Pollen Extract Other (See Comments)    Nasal congestion    Family History: Family History  Problem Relation Age of Onset  . Pancreatic cancer Father   . Stroke Mother   . Aneurysm Brother   . Neuropathy Neg Hx     Social History: Social History   Tobacco Use  . Smoking status: Former Smoker    Packs/day: 1.00    Years: 47.00    Pack years: 47.00    Types: Cigarettes    Last attempt to quit: 10/29/2000    Years since quitting: 17.6  . Smokeless tobacco: Never Used  Substance Use Topics  .  Alcohol use: Yes    Alcohol/week: 1.0 standard drinks    Types: 1 Standard drinks or equivalent per week    Comment: socially- nightly 1 cocktail  . Drug use: No   Social History   Social History Narrative   Lives with wife in a one story home with a basement.  Has 3 daughters.  Works one day a week at the [employer].  Retired from [employer].  Education: some college.    Caffeine use: decaf   Right handed    Review of Systems:  CONSTITUTIONAL: No fevers, chills, night sweats, or weight loss.   EYES: +visual changes or eye pain ENT: No hearing changes.  No history of nose bleeds.   RESPIRATORY: No cough, wheezing and shortness of breath.   CARDIOVASCULAR: Negative for chest pain, and palpitations.   GI: Negative for abdominal discomfort, blood in stools or black stools.  No recent change in bowel habits.   GU:  No history of incontinence.   MUSCLOSKELETAL: No history of joint pain or swelling.  No myalgias.   SKIN: Negative for lesions, rash, and itching.   HEMATOLOGY/ONCOLOGY: Negative for prolonged bleeding, bruising easily, and swollen nodes.  No history of cancer.   ENDOCRINE: Negative for cold or heat intolerance, polydipsia or goiter.   PSYCH:  No depression or  anxiety symptoms.   NEURO: As Above.   Vital Signs:  BP 124/80   Pulse 78   Ht 6' (1.829 m)   Wt 203 lb 6 oz (92.3 kg)   SpO2 92%   BMI 27.58 kg/m    General Medical Exam:   General:  Well appearing, comfortable.   Eyes/ENT: see cranial nerve examination.   Neck: No masses appreciated.  Full range of motion without tenderness.  No carotid bruits. Respiratory:  Clear to auscultation, good air entry bilaterally.   Cardiac:  Regular rate and rhythm, no murmur.   Extremities:  No deformities, edema, or skin discoloration.  Skin:  No rashes or lesions.  Neurological Exam: MENTAL STATUS including orientation to time, place, person, recent and remote memory, attention span and concentration, language, and fund of knowledge is normal.  Speech is not dysarthric.  CRANIAL NERVES: II:  No visual field defects.  Unremarkable fundi.   III-IV-VI: Pupils equal round and reactive to light.  Left eye shows weakness with upgaze and lateral gaze bilaterally, right eye extra-ocular eye movements are intact in all directions of gaze.  No nystagmus. Severe left ptosis with 80% eye closure.   V:  Normal facial sensation.     VII:  There is mild flattening of the left cheek.  Orbicular oculi, orbiculrais oris, buccinator is 4/5.   No pathologic facial reflexes.  VIII:  Normal hearing and vestibular function.   IX-X:  Normal palatal movement.   XI:  Normal shoulder shrug and head rotation.   XII:  Normal tongue strength and range of motion, no deviation or fasciculation.  MOTOR:  No atrophy, fasciculations or abnormal movements.  No pronator drift.  Tone is normal.    Right Upper Extremity:    Left Upper Extremity:    Deltoid  5/5   Deltoid  5/5   Biceps  5/5   Biceps  5/5   Triceps  5/5   Triceps  5/5   Wrist extensors  5/5   Wrist extensors  5/5   Wrist flexors  5/5   Wrist flexors  5/5   Finger extensors  5/5   Finger extensors  5/5  Finger flexors  5/5   Finger flexors  5/5   Dorsal  interossei  5/5   Dorsal interossei  5/5   Abductor pollicis  5/5   Abductor pollicis  5/5   Tone ([place] scale)  0  Tone ([place] scale)  0   Right Lower Extremity:    Left Lower Extremity:    Hip flexors  5/5   Hip flexors  5/5   Hip extensors  5/5   Hip extensors  5/5   Knee flexors  5/5   Knee flexors  5/5   Knee extensors  5/5   Knee extensors  5/5   Dorsiflexors  5/5   Dorsiflexors  5/5   Plantarflexors  5/5   Plantarflexors  5/5   Toe extensors  5/5   Toe extensors  5/5   Toe flexors  5/5   Toe flexors  5/5   Tone ([place] scale)  0  Tone ([place] scale)  0   MSRs:  Right                                                                 Left brachioradialis 2+  brachioradialis 2+  biceps 2+  biceps 2+  triceps 2+  triceps 2+  patellar 2+  patellar 2+  ankle jerk 0  ankle jerk 0  Hoffman no  Hoffman no  plantar response down  plantar response down   SENSORY: Temperature, pin prick, and vibration is reduced distal to ankles bilaterally.   COORDINATION/GAIT: Normal finger-to- nose-finger.  Intact rapid alternating movements bilaterally.  Gait narrow based and stable.    IMPRESSION: 1.  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  I had an extensive discussion with the patient regarding the diagnosis, pathogenesis, prognosis, management plan, meds to avoid, and potential crisis myasthenia gravis.  CT chest w contrast to evaluate for thymoma Start prednisone 20mg  daily Start mestinon 60mg  at 8am, 1pm, and 6pm Start prilosec 20mg  daily for GI prophylaxis Maintain calcium of 1200 mg/day and vitamin D intake of 800 international units/day through either diet and/or supplements Resource for information on myasthenia gravis was provided He was also provided with a list of medications that can worsen MG and that he should avoid.   2.  Chronic neck pain with worsening symptom with exertion and extension.  No evidence  of hyperreflexia on exam.   Proceed with MRI cervical spine to evaluate for compressive lesion  Return to clinic in 2-3 weeks  Greater than 50% of this 65 minute visit was spent in counseling, explanation of diagnosis, planning of further management, and coordination of care    Thank you for allowing me to participate in patient's care.  If I can answer any additional questions, I would be pleased to do so.    Sincerely,    Bethsaida Siegenthaler K. [name], DO

## 2018-07-02 NOTE — Patient Instructions (Addendum)
Start prednisone 20mg  daily  Increase pyridostigmine (mestinon) to 60mg  at 8am, 1pm, and 6pm.  Start prilosec 20mg  daily  Maintain calcium intake of 1200 mg/day and vitamin D intake of 800 international units/day through either diet and/or supplements  Annual influenza and multivalent pneumococcal vaccination  We will order CT chest to evaluate thymus gland and MRI cervical spine without contrast for your neck pain  Please visit [email or web address] for more information.   MG medication alert.  1. D-penicillamine should never be used in myasthenic patients.   2. The following drugs produce worsening of weakness in most MG patients who receive them. Use with caution and monitor patients for exacerbation of myasthenic symptoms:  Neuromuscular blocking agents including:  Succinylcholine  d-tubocurarine  Vecuronium  Botulinum toxin  Quinine, quinidine and procainamide  Selected antibiotics including:  Aminoglycosides (tobramycin, gentamycin, kanamycin, neomycin, streptomycin)  Macrolides (erythromycin, azithromycin, telithromycin)  Fluoroquinolones (ciprofloxacin, moxifloxacin, norfloxacin, ofloxacin, pefloxacin)  Colistin  Beta-blockers (such as propranolol, timolol maleate eyedrops)  Calcium channel blockers  Iodinated contrast agents  Magnesium salts, including:  Milk of magnesia  Antacids containing magnesium hydroxide (Maalox, Mylanta)  Epsom salts   Return to clinic on September 26th at 3pm

## 2018-07-07 ENCOUNTER — Other Ambulatory Visit: Payer: Self-pay | Admitting: *Deleted

## 2018-07-07 ENCOUNTER — Telehealth: Payer: Self-pay | Admitting: Neurology

## 2018-07-07 MED ORDER — TIZANIDINE HCL 2 MG PO CAPS: 2.0000 mg | ORAL_CAPSULE | Freq: Every day | ORAL | 3 refills | Status: DC

## 2018-07-07 NOTE — Telephone Encounter (Signed)
Please advise 

## 2018-07-07 NOTE — Telephone Encounter (Signed)
He is already on prednisone which was started for myasthenia and also used in pain relief.   We can try very low dose muscle relaxant, tizanidine 2mg  at bedtime.  Need to be cautious as this can make weakness worse.   Donika K. [name], DO

## 2018-07-07 NOTE — Telephone Encounter (Signed)
Patient informed that a new med will be sent in for him to try.

## 2018-07-07 NOTE — Telephone Encounter (Signed)
Patient called and wanted to know if there was anything that he could take to help with the pain in his neck and back. He needs something to get him by until he comes for his follow up in a month. Please call back at [phone]. Thanks!

## 2018-07-11 ENCOUNTER — Ambulatory Visit
Admission: RE | Admit: 2018-07-11 | Discharge: 2018-07-11 | Disposition: A | Discharge status: Home / Self Care | Payer: Medicare Other | Source: Ambulatory Visit | Attending: Neurology | Admitting: Neurology

## 2018-07-11 DIAGNOSIS — M4802 Spinal stenosis, cervical region: Secondary | ICD-10-CM | POA: Diagnosis not present

## 2018-07-11 DIAGNOSIS — G7001 Myasthenia gravis with (acute) exacerbation: Secondary | ICD-10-CM

## 2018-07-11 DIAGNOSIS — R911 Solitary pulmonary nodule: Secondary | ICD-10-CM | POA: Diagnosis not present

## 2018-07-11 DIAGNOSIS — M542 Cervicalgia: Secondary | ICD-10-CM

## 2018-07-11 MED ORDER — IOPAMIDOL (ISOVUE-300) INJECTION 61%
75.0000 mL | Freq: Once | INTRAVENOUS | Status: AC | PRN
  Administered 2018-07-11: 75 mL via INTRAVENOUS

## 2018-07-14 ENCOUNTER — Telehealth: Payer: Self-pay | Admitting: Neurology

## 2018-07-14 MED ORDER — ATORVASTATIN CALCIUM 10 MG PO TABS: 10.0000 mg | ORAL_TABLET | Freq: Every day | ORAL | 5 refills | Status: DC

## 2018-07-14 MED ORDER — PYRIDOSTIGMINE BROMIDE 60 MG PO TABS: ORAL_TABLET | ORAL | 5 refills | Status: DC

## 2018-07-14 MED ORDER — PREDNISONE 20 MG PO TABS: 20.0000 mg | ORAL_TABLET | Freq: Two times a day (BID) | ORAL | 3 refills | Status: DC

## 2018-07-14 NOTE — Telephone Encounter (Signed)
CT chest does not show residual thymic tissue.  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  This was discussed with patient via telephone.  He denies left sided radicular pain and complains moreso of pain on the right side of his neck.  He also tells me that his face is getting weaker and swallowing is more difficult, as well as increased drooling.  Recommend the following: 1. Stop tizanidine  2. Reduce mestinon to 30mg  three times daily  3. Increase prednisone to 20mg  twice daily (40mg /d).  4. Go back to taking lipitor 10mg  daily - there is no stroke and LDL is well controlled  He will follow-up with me next week.  If no improvement, will need to consider IVIG.   Dunia Pringle K. [name], DO

## 2018-07-17 ENCOUNTER — Inpatient Hospital Stay (HOSPITAL_COMMUNITY)
Admission: EM | Admit: 2018-07-17 | Discharge: 2018-07-23 | DRG: 056 | Disposition: A | Discharge status: Home / Self Care | Payer: Medicare Other | Attending: Internal Medicine | Admitting: Internal Medicine

## 2018-07-17 ENCOUNTER — Telehealth: Payer: Self-pay | Admitting: Neurology

## 2018-07-17 ENCOUNTER — Other Ambulatory Visit: Payer: Self-pay

## 2018-07-17 ENCOUNTER — Encounter (HOSPITAL_COMMUNITY): Payer: Self-pay | Admitting: Emergency Medicine

## 2018-07-17 DIAGNOSIS — E785 Hyperlipidemia, unspecified: Secondary | ICD-10-CM | POA: Diagnosis present

## 2018-07-17 DIAGNOSIS — E876 Hypokalemia: Secondary | ICD-10-CM | POA: Diagnosis not present

## 2018-07-17 DIAGNOSIS — K5790 Diverticulosis of intestine, part unspecified, without perforation or abscess without bleeding: Secondary | ICD-10-CM | POA: Diagnosis present

## 2018-07-17 DIAGNOSIS — R4702 Dysphasia: Secondary | ICD-10-CM | POA: Diagnosis not present

## 2018-07-17 DIAGNOSIS — Z800 Family history of malignant neoplasm of digestive organs: Secondary | ICD-10-CM

## 2018-07-17 DIAGNOSIS — Z823 Family history of stroke: Secondary | ICD-10-CM | POA: Diagnosis not present

## 2018-07-17 DIAGNOSIS — Z87891 Personal history of nicotine dependence: Secondary | ICD-10-CM

## 2018-07-17 DIAGNOSIS — I10 Essential (primary) hypertension: Secondary | ICD-10-CM | POA: Diagnosis present

## 2018-07-17 DIAGNOSIS — G609 Hereditary and idiopathic neuropathy, unspecified: Secondary | ICD-10-CM | POA: Diagnosis present

## 2018-07-17 DIAGNOSIS — R29810 Facial weakness: Secondary | ICD-10-CM | POA: Diagnosis present

## 2018-07-17 DIAGNOSIS — Z91048 Other nonmedicinal substance allergy status: Secondary | ICD-10-CM | POA: Diagnosis not present

## 2018-07-17 DIAGNOSIS — M1712 Unilateral primary osteoarthritis, left knee: Secondary | ICD-10-CM | POA: Diagnosis present

## 2018-07-17 DIAGNOSIS — G7000 Myasthenia gravis without (acute) exacerbation: Secondary | ICD-10-CM | POA: Diagnosis not present

## 2018-07-17 DIAGNOSIS — Z7982 Long term (current) use of aspirin: Secondary | ICD-10-CM | POA: Diagnosis not present

## 2018-07-17 DIAGNOSIS — G7001 Myasthenia gravis with (acute) exacerbation: Secondary | ICD-10-CM | POA: Diagnosis not present

## 2018-07-17 DIAGNOSIS — N400 Enlarged prostate without lower urinary tract symptoms: Secondary | ICD-10-CM | POA: Diagnosis present

## 2018-07-17 DIAGNOSIS — E43 Unspecified severe protein-calorie malnutrition: Secondary | ICD-10-CM

## 2018-07-17 DIAGNOSIS — Z87442 Personal history of urinary calculi: Secondary | ICD-10-CM | POA: Diagnosis not present

## 2018-07-17 DIAGNOSIS — J449 Chronic obstructive pulmonary disease, unspecified: Secondary | ICD-10-CM | POA: Diagnosis present

## 2018-07-17 DIAGNOSIS — R4701 Aphasia: Secondary | ICD-10-CM | POA: Diagnosis present

## 2018-07-17 DIAGNOSIS — Z86010 Personal history of colonic polyps: Secondary | ICD-10-CM

## 2018-07-17 DIAGNOSIS — Z6826 Body mass index (BMI) 26.0-26.9, adult: Secondary | ICD-10-CM

## 2018-07-17 LAB — COMPREHENSIVE METABOLIC PANEL
ALT: 21 U/L (ref 0–44)
AST: 18 U/L (ref 15–41)
Albumin: 3.5 g/dL (ref 3.5–5.0)
Alkaline Phosphatase: 62 U/L (ref 38–126)
Anion gap: 10 (ref 5–15)
BUN: 13 mg/dL (ref 8–23)
CO2: 30 mmol/L (ref 22–32)
Calcium: 8.8 mg/dL — ABNORMAL LOW (ref 8.9–10.3)
Chloride: 104 mmol/L (ref 98–111)
Creatinine, Ser: 0.9 mg/dL (ref 0.61–1.24)
GFR calc Af Amer: >60 mL/min (ref >60)
GFR calc non Af Amer: >60 mL/min (ref >60)
Glucose, Bld: 101 mg/dL — ABNORMAL HIGH (ref 70–99)
Potassium: 3.5 mmol/L (ref 3.5–5.1)
Sodium: 144 mmol/L (ref 135–145)
Total Bilirubin: 1 mg/dL (ref 0.3–1.2)
Total Protein: 5.9 g/dL — ABNORMAL LOW (ref 6.5–8.1)

## 2018-07-17 LAB — CBC WITH DIFFERENTIAL/PLATELET
Abs Immature Granulocytes: 0 10*3/uL (ref 0.0–0.1)
Basophils Absolute: 0 10*3/uL (ref 0.0–0.1)
Basophils Relative: 0 %
Eosinophils Absolute: 0.1 10*3/uL (ref 0.0–0.7)
Eosinophils Relative: 1 %
HCT: 46.6 % (ref 39.0–52.0)
Hemoglobin: 14.9 g/dL (ref 13.0–17.0)
Immature Granulocytes: 0 %
Lymphocytes Relative: 17 %
Lymphs Abs: 1.8 10*3/uL (ref 0.7–4.0)
MCH: 29 pg (ref 26.0–34.0)
MCHC: 32 g/dL (ref 30.0–36.0)
MCV: 90.8 fL (ref 78.0–100.0)
Monocytes Absolute: 0.7 10*3/uL (ref 0.1–1.0)
Monocytes Relative: 7 %
Neutro Abs: 7.8 10*3/uL — ABNORMAL HIGH (ref 1.7–7.7)
Neutrophils Relative %: 75 %
Platelets: 230 10*3/uL (ref 150–400)
RBC: 5.13 MIL/uL (ref 4.22–5.81)
RDW: 13.2 % (ref 11.5–15.5)
WBC: 10.4 10*3/uL (ref 4.0–10.5)

## 2018-07-17 MED ORDER — SODIUM CHLORIDE 0.9 % IV SOLN
Freq: Once | INTRAVENOUS | Status: AC
  Administered 2018-07-17: 12:00:00 via INTRAVENOUS

## 2018-07-17 MED ORDER — FINASTERIDE 5 MG PO TABS
5.0000 mg | ORAL_TABLET | Freq: Every day | ORAL | Status: DC
  Administered 2018-07-18 – 2018-07-23 (×6): 5 mg via ORAL
  Filled 2018-07-17 (×6): qty 1

## 2018-07-17 MED ORDER — ASPIRIN EC 325 MG PO TBEC
325.0000 mg | DELAYED_RELEASE_TABLET | Freq: Every day | ORAL | Status: DC
  Administered 2018-07-18 – 2018-07-23 (×6): 325 mg via ORAL
  Filled 2018-07-17 (×6): qty 1

## 2018-07-17 MED ORDER — PYRIDOSTIGMINE BROMIDE 60 MG PO TABS
30.0000 mg | ORAL_TABLET | ORAL | Status: DC
  Administered 2018-07-18 – 2018-07-23 (×14): 30 mg via ORAL
  Filled 2018-07-17 (×14): qty 1

## 2018-07-17 MED ORDER — ATORVASTATIN CALCIUM 10 MG PO TABS
10.0000 mg | ORAL_TABLET | Freq: Every day | ORAL | Status: DC
  Administered 2018-07-18 – 2018-07-22 (×5): 10 mg via ORAL
  Filled 2018-07-17 (×5): qty 1

## 2018-07-17 MED ORDER — PREDNISONE 20 MG PO TABS
40.0000 mg | ORAL_TABLET | Freq: Every day | ORAL | Status: DC
  Administered 2018-07-18 – 2018-07-23 (×6): 40 mg via ORAL
  Filled 2018-07-17 (×7): qty 2

## 2018-07-17 MED ORDER — ENOXAPARIN SODIUM 40 MG/0.4ML ~~LOC~~ SOLN
40.0000 mg | SUBCUTANEOUS | Status: DC
  Administered 2018-07-18 – 2018-07-22 (×5): 40 mg via SUBCUTANEOUS
  Filled 2018-07-17 (×5): qty 0.4

## 2018-07-17 MED ORDER — PYRIDOSTIGMINE BROMIDE 60 MG PO TABS: 120.0000 mg | ORAL_TABLET | Freq: Three times a day (TID) | ORAL | Status: DC

## 2018-07-17 MED ORDER — DOXAZOSIN MESYLATE 8 MG PO TABS
8.0000 mg | ORAL_TABLET | Freq: Every day | ORAL | Status: DC
  Administered 2018-07-18 – 2018-07-23 (×6): 8 mg via ORAL
  Filled 2018-07-17 (×7): qty 1

## 2018-07-17 MED ORDER — VITAMIN B-12 1000 MCG PO TABS
1000.0000 ug | ORAL_TABLET | Freq: Every day | ORAL | Status: DC
  Administered 2018-07-18 – 2018-07-23 (×6): 1000 ug via ORAL
  Filled 2018-07-17 (×6): qty 1

## 2018-07-17 NOTE — Telephone Encounter (Signed)
Patient's wife called in stating that he was having a rough morning. He has a swollen throat-cant swallow or take any of his medications. Please call her back [phone]. Thanks!

## 2018-07-17 NOTE — H&P (Signed)
[facility] History and Physical  [name] QHU:[id number] DOB: [birth year] DOA: 07/17/2018  Referring physician:  PCP: Hulan Fess, MD  Specialists:   Chief Complaint: dysphasia   HPI: [name] is a 77 y.o. male with PMH of BPH, HPL, recent diagnosis of myasthenia (05/2018) started on prednisone, mestinon presented with dysphasia. Patient states that he has been experiencing progressive difficulty with swallowing for two days. He reports worsening his right eye ptosis with diplopia. He denies acute weaknesses or paresthesias in his extremities. No vertigo. He reports chronic back pains, no with recent changes. He denies recent trauma, no tick bites, no fevers. No acute cardiopulmonary symptoms. No diarrhea, no vomiting. ED d/w neurology who recommended admission for IVIG in step down unit. hospitalist is called for admission.    Review of Systems: The patient denies anorexia, fever, weight loss,, vision loss, decreased hearing, hoarseness, chest pain, syncope, dyspnea on exertion, peripheral edema, balance deficits, hemoptysis, abdominal pain, melena, hematochezia, severe indigestion/heartburn, hematuria, incontinence, genital sores, muscle weakness, suspicious skin lesions, transient blindness, difficulty walking, depression, unusual weight change, abnormal bleeding, enlarged lymph nodes, angioedema, and breast masses.    Past Medical History:  Diagnosis Date  . Colon polyp   . Diverticulosis    extensive 3'15.  . Dysrhythmia    hx. Ablation for tachycardia, no routine cardiology visits now  . ED (erectile dysfunction)   . Heart murmur    teenager  . History of colon polyps   . History of kidney stones    x1 passed  . Hyperlipidemia   . 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 Osteoarthritis   . Palpitation    with svt rate 150  . Tendinitis    achiles heel spur  . Testicular atrophy   . Tubular adenoma of colon    Past Surgical History:  Procedure  Laterality Date  . ABLATION  2010   cardiac   . COLONOSCOPY    . EUS N/A 11/17/2014   Procedure: ESOPHAGEAL ENDOSCOPIC ULTRASOUND (EUS) RADIAL;  Surgeon: [name], MD;  Location: [facility];  Service: Endoscopy;  Laterality: N/A;  . FINE NEEDLE ASPIRATION N/A 11/17/2014   Procedure: FINE NEEDLE ASPIRATION (FNA) LINEAR;  Surgeon: [name], MD;  Location: [facility];  Service: Endoscopy;  Laterality: N/A;  + or- fna  . FLEXIBLE SIGMOIDOSCOPY    . HEMORRHOID SURGERY     banding   . HERNIA REPAIR Bilateral    '05 bilateral hernia  . KNEE ARTHROSCOPY Left    scope '08  . KNEE ARTHROSCOPY    . LAPAROSCOPIC APPENDECTOMY N/A 09/05/2014   Procedure: APPENDECTOMY LAPAROSCOPIC;  Surgeon: [name], MD;  Location: [facility];  Service: General;  Laterality: N/A;  . ROTATOR CUFF REPAIR Right 2016   Social History:  reports that he quit smoking about 17 years ago. His smoking use included cigarettes. He has a 47.00 pack-year smoking history. He has never used smokeless tobacco. He reports that he drinks about 1.0 standard drinks of alcohol per week. He reports that he does not use drugs. Home;  where does patient live--home, ALF, SNF? and with whom if at home? Yes;  Can patient participate in ADLs?  Allergies  Allergen Reactions  . Pollen Extract Other (See Comments)    Nasal congestion    Family History  Problem Relation Age of Onset  . Pancreatic cancer Father   . Stroke Mother   . Aneurysm Brother   . Neuropathy Neg Hx     (  be sure to complete)  Prior to Admission medications   Medication Sig Start Date End Date Taking? Authorizing Provider  aspirin EC 325 MG EC tablet Take 1 tablet (325 mg total) by mouth daily. 06/16/18  Yes Nita Sells, MD  atorvastatin (LIPITOR) 10 MG tablet Take 1 tablet (10 mg total) by mouth daily at 6 PM. 07/14/18  Yes Patel, Donika K, DO  Calcium Carbonate-Vitamin D (CALCIUM 600+D) 600-400 MG-UNIT per tablet Take 1 tablet by mouth daily.      Yes [provider]  Cyanocobalamin (VITAMIN B-12 PO) Take 1 tablet by mouth daily.   Yes [provider]  doxazosin (CARDURA) 8 MG tablet Take 8 mg by mouth daily.   Yes [provider]  finasteride (PROSCAR) 5 MG tablet Take 5 mg by mouth daily.  08/20/14  Yes [provider]  Glucosamine HCl 1500 MG TABS Take 1,500 mg by mouth daily.   Yes [provider]  ibuprofen (ADVIL,MOTRIN) 200 MG tablet Take 200 mg by mouth every 6 (six) hours as needed for headache (pain).   Yes [provider]  Multiple Vitamin (MULTIVITAMIN WITH MINERALS) TABS tablet Take 1 tablet by mouth daily.   Yes [provider]  predniSONE (DELTASONE) 10 MG tablet Take 20 mg by mouth daily. 07/02/18  Yes [provider]  Psyllium (METAMUCIL PO) Take 15 mLs by mouth daily. Mix in 8 oz water and drink   Yes [provider]  pyridostigmine (MESTINON) 60 MG tablet Take half tablet at 8am, 1pm, and 6pm Patient taking differently: Take 120 mg by mouth 3 (three) times daily. Take half tablet at 8am, 1pm, and 6pm 07/14/18  Yes Patel, Donika K, DO  predniSONE (DELTASONE) 20 MG tablet Take 1 tablet (20 mg total) by mouth 2 (two) times daily with a meal. Patient not taking: Reported on 07/17/2018 07/14/18   Alda Berthold, DO   Physical Exam: Vitals:   07/17/18 1330 07/17/18 1400  BP: 137/76 135/77  Pulse: (!) 55 (!) 57  Resp:    Temp:    SpO2: 95% 94%     General:  No distress   Eyes: right ptosis. EOM-i  ENT: no oral ulcers   Neck: supple, no JVD  Cardiovascular: s1,s2 rrr  Respiratory: CTA BL  Abdomen: soft, nt   Skin: no rash   Musculoskeletal: no leg edema   Psychiatric: no hallucinations   Neurologic: right ptosis. Non focal exam. dtr+  Labs on Admission:  Basic Metabolic Panel: Recent Labs  Lab 07/17/18 1218  NA 144  K 3.5  CL 104  CO2 30  GLUCOSE 101*  BUN 13  CREATININE 0.90  CALCIUM 8.8*   Liver Function  Tests: Recent Labs  Lab 07/17/18 1218  AST 18  ALT 21  ALKPHOS 62  BILITOT 1.0  PROT 5.9*  ALBUMIN 3.5   No results for input(s): LIPASE, AMYLASE in the last 168 hours. No results for input(s): AMMONIA in the last 168 hours. CBC: Recent Labs  Lab 07/17/18 1218  WBC 10.4  NEUTROABS 7.8*  HGB 14.9  HCT 46.6  MCV 90.8  PLT 230   Cardiac Enzymes: No results for input(s): CKTOTAL, CKMB, CKMBINDEX, TROPONINI in the last 168 hours.  BNP (last 3 results) No results for input(s): BNP in the last 8760 hours.  ProBNP (last 3 results) No results for input(s): PROBNP in the last 8760 hours.  CBG: No results for input(s): GLUCAP in the last 168 hours.  Radiological Exams on  Admission: No results found.  EKG: Independently reviewed.   Assessment/Plan Active Problems:   Myasthenia gravis with exacerbation (HCC)   BPH (benign prostatic hyperplasia)   Myasthenia ([place])   Dysphasia   77 y.o. male with PMH of BPH, HPL, recent diagnosis of myasthenia (05/2018) started on prednisone, mestinon presented with dysphasia, diplopia, ptosis   Acute exacerbation of myasthenia, worsening with dysphasia, diplopia, ptosis. No respiratory symptoms. Despite on prednisone 40 mg, mestinon at home -cont per neurology, IVIG. Cont prednisone. Monitor closely. Recent chest ct: no evidence for residual or recurrent thymus tissue or thymic neoplasm. May need thymectomy reevaluation. No acute respiratory symptoms. Will monitor in step down per neurology recs. Check nif, VC . Obtain speech eval    BPH. Resume home regimen.    Neurology;  if consultant consulted, please document name and whether formally or informally consulted  Code Status: full  (must indicate code status--if unknown or must be presumed, indicate so) Family Communication: d/w patient, his family, neurology  (indicate person spoken with, if applicable, with phone number if by telephone) Disposition Plan: home when ready  (indicate  anticipated LOS)  Time spent: >45 minutes   Kinnie Feil [facility] Pager [phone]  If 7PM-7AM, please contact night-coverage [email or web address] Password [email or web address] 07/17/2018, 2:17 PM

## 2018-07-17 NOTE — ED Notes (Signed)
Dr. [name] called to [phone] @ 1107-per Dr. Gloris [place] by Levada Dy

## 2018-07-17 NOTE — Consult Note (Signed)
Neurology Consultation ^^^For educational purposes only^^^  Reason for Consult: difficulty swallowing  CC: difficulty swallowing   History is obtained from: patient and wife   HPI: [name] is a 77 y.o. male with PMH significant for hyperlipidemia, neuroma and recent diagnosis of Myasthenia Gravis that presents to the ED on today (07/17/18) with c/o a gradual onset of dysphagia, oral edema and left-eye ptosis for the past day. Patient reports that his lips and tongue felt as if they were "swollen" with increased drooling and that he was having difficulty with consuming food and taking his oral medications with worsening symptoms upon arrival. The patient's wife called his Neurologist and the office instructed her to bring him to our ED to be evaluated.   Upon arrival to the ED, the patient was found to have ptosis with closure and slurred speech; but  he did not show any signs of distress and his respiratory status remained stable. CT chest wo contrast was ordered and the Neurology service was consulted.  During examination, the patient was resting comfortably in bed with his wife at the bedside. He had some dysarthria but was negative for any noticeable oral edema. His chest expansion was symmetrical and his breathing pattern was normal without any laboring noted. Patient reports that since his diagnosis of Myasthenia Gravis he has had the drooping of the left eye that is usually open during the morning and gradual closes as the day progresses; but the feeling of oral edema and dysphagia are both new. Patient states that he is able to ambulate and perform activities of daily living independently; but he no longer drives. He also reports that there was a recent dosage change in his Mestinon by his Neurologist and the changes occurred around that same time. Patient denies any shortness of breath, weakness or diplopia.     Chart review (prior neurology notes/ discharge): Recent admission on  06/14/18 the patient was diagnosed with Myasthenia Gravis to follow-up with Dr. [name] of neurology as an outpatient and continue Mestinon.     ROS: A 14 point ROS was performed and is negative except as noted in the HPI.   Past Medical History:  Diagnosis Date  . Colon polyp   . Diverticulosis    extensive 3'15.  . Dysrhythmia    hx. Ablation for tachycardia, no routine cardiology visits now  . ED (erectile dysfunction)   . Heart murmur    teenager  . History of colon polyps   . History of kidney stones    x1 passed  . Hyperlipidemia   . 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 Osteoarthritis   . Palpitation    with svt rate 150  . Tendinitis    achiles heel spur  . Testicular atrophy   . Tubular adenoma of colon      Family History  Problem Relation Age of Onset  . Pancreatic cancer Father   . Stroke Mother   . Aneurysm Brother   . Neuropathy Neg Hx     Social History:   reports that he quit smoking about 17 years ago. His smoking use included cigarettes. He has a 47.00 pack-year smoking history. He has never used smokeless tobacco. He reports that he drinks about 1.0 standard drinks of alcohol per week. He reports that he does not use drugs.  Medications No current facility-administered medications for this encounter.   Current Outpatient Medications:  .  aspirin EC 325 MG EC tablet, Take 1 tablet (325  mg total) by mouth daily., Disp: 30 tablet, Rfl: 0 .  atorvastatin (LIPITOR) 10 MG tablet, Take 1 tablet (10 mg total) by mouth daily at 6 PM., Disp: 30 tablet, Rfl: 5 .  Calcium Carbonate-Vitamin D (CALCIUM 600+D) 600-400 MG-UNIT per tablet, Take 1 tablet by mouth daily.  , Disp: , Rfl:  .  Cyanocobalamin (VITAMIN B-12 PO), Take 1 tablet by mouth daily., Disp: , Rfl:  .  doxazosin (CARDURA) 8 MG tablet, Take 8 mg by mouth daily., Disp: , Rfl:  .  finasteride (PROSCAR) 5 MG tablet, Take 5 mg by mouth daily. , Disp: , Rfl: 2 .  Glucosamine HCl 1500 MG TABS,  Take 1,500 mg by mouth daily., Disp: , Rfl:  .  ibuprofen (ADVIL,MOTRIN) 200 MG tablet, Take 200 mg by mouth every 6 (six) hours as needed for headache (pain)., Disp: , Rfl:  .  Multiple Vitamin (MULTIVITAMIN WITH MINERALS) TABS tablet, Take 1 tablet by mouth daily., Disp: , Rfl:  .  predniSONE (DELTASONE) 10 MG tablet, Take 20 mg by mouth daily., Disp: , Rfl: 3 .  Psyllium (METAMUCIL PO), Take 15 mLs by mouth daily. Mix in 8 oz water and drink, Disp: , Rfl:  .  pyridostigmine (MESTINON) 60 MG tablet, Take half tablet at 8am, 1pm, and 6pm (Patient taking differently: Take 120 mg by mouth 3 (three) times daily. Take half tablet at 8am, 1pm, and 6pm), Disp: 90 tablet, Rfl: 5 .  predniSONE (DELTASONE) 20 MG tablet, Take 1 tablet (20 mg total) by mouth 2 (two) times daily with a meal. (Patient not taking: Reported on 07/17/2018), Disp: 90 tablet, Rfl: 3   Exam: Current vital signs: BP 137/76   Pulse (!) 55   Temp 97.6 F (36.4 C) (Oral)   Resp 18   Ht 6' (1.829 m)   Wt 92.2 kg   SpO2 95%   BMI 27.57 kg/m  Vital signs in last 24 hours: Temp:  [97.6 F (36.4 C)] 97.6 F (36.4 C) (09/19 0939) Pulse Rate:  [55-72] 55 (09/19 1330) Resp:  [18] 18 (09/19 0939) BP: (120-145)/(76-99) 137/76 (09/19 1330) SpO2:  [92 %-98 %] 95 % (09/19 1330) Weight:  [92.2 kg] 92.2 kg (09/19 1130)  GENERAL: Awake, alert in NAD, resting comfortably in bed  HEENT: - Normocephalic and atraumatic, moist mm, negative for lymph node edema  Ext: warm, well perfused, intact peripheral pulses, negative for edema  NEURO:  Mental Status: AA&Ox3, speech is dysarthric.  Naming, repetition, fluency, and comprehension intact. Cranial Nerves: PERRL, 3 mm/brisk. EOMI, vertical gaze intact, ptsosis, visual fields full, facial asymmetry with drooping to the left side, facial sensation intact, hearing intact, tongue/uvula/soft palate midline.  Motor: positive facial weakness and neck flexion weakness,  full range of motion in all  extremities, 5/5 strength in all extremities   Tone: is normal and bulk is normal Sensation- Intact to light touch bilaterally Coordination: FTN intact bilaterally  Gait- deferred  Labs I have reviewed labs in epic and the results pertinent to this consultation are:   CBC    Component Value Date/Time   WBC 10.4 07/17/2018 1218   RBC 5.13 07/17/2018 1218   HGB 14.9 07/17/2018 1218   HGB 15.2 03/08/2016 1133   HGB 15.0 11/02/2014 1104   HCT 46.6 07/17/2018 1218   HCT 46.7 06/14/2018 1605   HCT 45.6 11/02/2014 1104   PLT 230 07/17/2018 1218   PLT 249 03/08/2016 1133   MCV 90.8 07/17/2018 1218   MCV 88  03/08/2016 1133   MCV 89.1 11/02/2014 1104   MCH 29.0 07/17/2018 1218   MCHC 32.0 07/17/2018 1218   RDW 13.2 07/17/2018 1218   RDW 14.0 03/08/2016 1133   RDW 13.9 11/02/2014 1104   LYMPHSABS 1.8 07/17/2018 1218   LYMPHSABS 1.2 11/02/2014 1104   MONOABS 0.7 07/17/2018 1218   MONOABS 0.5 11/02/2014 1104   EOSABS 0.1 07/17/2018 1218   EOSABS 0.2 11/02/2014 1104   BASOSABS 0.0 07/17/2018 1218   BASOSABS 0.0 11/02/2014 1104    CMP     Component Value Date/Time   NA 144 07/17/2018 1218   NA 143 03/08/2016 1133   NA 145 11/02/2014 1104   K 3.5 07/17/2018 1218   K 3.6 11/02/2014 1104   CL 104 07/17/2018 1218   CO2 30 07/17/2018 1218   CO2 31 (H) 11/02/2014 1104   GLUCOSE 101 (H) 07/17/2018 1218   GLUCOSE 83 11/02/2014 1104   BUN 13 07/17/2018 1218   BUN 11 03/08/2016 1133   BUN 10.0 11/02/2014 1104   CREATININE 0.90 07/17/2018 1218   CREATININE 0.8 11/02/2014 1104   CALCIUM 8.8 (L) 07/17/2018 1218   CALCIUM 9.3 11/02/2014 1104   PROT 5.9 (L) 07/17/2018 1218   PROT 6.8 03/08/2016 1133   PROT 7.1 11/02/2014 1104   ALBUMIN 3.5 07/17/2018 1218   ALBUMIN 4.2 03/08/2016 1133   ALBUMIN 3.9 11/02/2014 1104   AST 18 07/17/2018 1218   AST 21 11/02/2014 1104   ALT 21 07/17/2018 1218   ALT 24 11/02/2014 1104   ALKPHOS 62 07/17/2018 1218   ALKPHOS 79 11/02/2014 1104    BILITOT 1.0 07/17/2018 1218   BILITOT 0.9 03/08/2016 1133   BILITOT 0.85 11/02/2014 1104   GFRNONAA >60 07/17/2018 1218   GFRAA >60 07/17/2018 1218    Lipid Panel     Component Value Date/Time   CHOL 138 06/14/2018 1605   TRIG 71 06/14/2018 1605   HDL 48 06/14/2018 1605   CHOLHDL 2.9 06/14/2018 1605   VLDL 14 06/14/2018 1605   LDLCALC 76 06/14/2018 1605     Imaging I have reviewed the images obtained:  MRI cervical spine wo contrast: Performed on 06/14/18: Impression: Negative for actue infarct. Chronic microhemorhae in the left frontal and left occipital parietal lobe most likely due to hypertension.        Assessment & Impression: 77 year old, caucasian male with newly diagnosed Myasthenia Gravis presents to the ED with dysphagia, dysarthria and ptosis of the left eye. He reports that there was a recent change in his Mestonin dosage and that the oral symptoms shortly after that change. At this time the patient does not show any signs of respiratory distress or compromise. Differential diagnosis at this time includes dysphagia likely secondary to Myasthenia Crisis.       Recommendations: -assess respiratory status every 2 hours  -IVIG administration x5 days -administration of seasonal influenza vaccine and pneuomina vaccine to mitigate risk of respiratory compromise associated with respiratory infections -Mestinon 30mg  tablet, take 1 tablet by mouth at 8am, 1pm and 6pm -Monitor BMP  -hold all medications that can decrease respiratory drive

## 2018-07-17 NOTE — ED Notes (Signed)
Dr. Lorraine Lax at bedside

## 2018-07-17 NOTE — ED Provider Notes (Signed)
[facility] EMERGENCY DEPARTMENT Provider Note   CSN: [id number] Arrival date & time: 07/17/18  5784     History   Chief Complaint Chief Complaint  Patient presents with  . Myasthenia Gravis    HPI [name] is a 77 y.o. male.  Patient with a known recent diagnosis of myasthenia gravis presents with difficulty swallowing and closing his left eyelid.  He is presently on Mestinon and prednisone for his diagnosis.  He was able to ambulate to the emergency department from his vehicle.  No respiratory distress, chest pain, fever, sweats, chills.  Severity of symptoms is moderate.  Nothing makes symptoms better or worse.     Past Medical History:  Diagnosis Date  . Colon polyp   . Diverticulosis    extensive 3'15.  . Dysrhythmia    hx. Ablation for tachycardia, no routine cardiology visits now  . ED (erectile dysfunction)   . Heart murmur    teenager  . History of colon polyps   . History of kidney stones    x1 passed  . Hyperlipidemia   . 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 Osteoarthritis   . Palpitation    with svt rate 150  . Tendinitis    achiles heel spur  . Testicular atrophy   . Tubular adenoma of colon     Patient Active Problem List   Diagnosis Date Noted  . Myasthenia gravis with exacerbation ([place]) 07/02/2018  . Facial droop 06/14/2018  . BPH (benign prostatic hyperplasia) 06/14/2018  . HTN (hypertension) 06/14/2018  . 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 Unilateral primary osteoarthritis, left knee 03/17/2018  . Mass of stomach 11/02/2014  . 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 Solitary pulmonary nodule 10/26/2014  . Appendicitis 09/05/2014  . Palpitations 02/15/2011  . SUPRAVENTRICULAR TACHYCARDIA 01/11/2011    Past Surgical History:  Procedure Laterality Date  . ABLATION  2010   cardiac   . COLONOSCOPY    . EUS N/A 11/17/2014   Procedure: ESOPHAGEAL ENDOSCOPIC ULTRASOUND (EUS) RADIAL;  Surgeon: [name], MD;   Location: [facility];  Service: Endoscopy;  Laterality: N/A;  . FINE NEEDLE ASPIRATION N/A 11/17/2014   Procedure: FINE NEEDLE ASPIRATION (FNA) LINEAR;  Surgeon: [name], MD;  Location: [facility];  Service: Endoscopy;  Laterality: N/A;  + or- fna  . FLEXIBLE SIGMOIDOSCOPY    . HEMORRHOID SURGERY     banding   . HERNIA REPAIR Bilateral    '05 bilateral hernia  . KNEE ARTHROSCOPY Left    scope '08  . KNEE ARTHROSCOPY    . LAPAROSCOPIC APPENDECTOMY N/A 09/05/2014   Procedure: APPENDECTOMY LAPAROSCOPIC;  Surgeon: [name], MD;  Location: [facility];  Service: General;  Laterality: N/A;  . ROTATOR CUFF REPAIR Right 2016        Home Medications    Prior to Admission medications   Medication Sig Start Date End Date Taking? Authorizing Provider  aspirin EC 325 MG EC tablet Take 1 tablet (325 mg total) by mouth daily. 06/16/18  Yes Nita Sells, MD  atorvastatin (LIPITOR) 10 MG tablet Take 1 tablet (10 mg total) by mouth daily at 6 PM. 07/14/18  Yes Patel, Donika K, DO  Calcium Carbonate-Vitamin D (CALCIUM 600+D) 600-400 MG-UNIT per tablet Take 1 tablet by mouth daily.     Yes [provider]  Cyanocobalamin (VITAMIN B-12 PO) Take 1 tablet by mouth daily.   Yes [provider]  doxazosin (CARDURA) 8 MG tablet Take 8  mg by mouth daily.   Yes [provider]  finasteride (PROSCAR) 5 MG tablet Take 5 mg by mouth daily.  08/20/14  Yes [provider]  Glucosamine HCl 1500 MG TABS Take 1,500 mg by mouth daily.   Yes [provider]  ibuprofen (ADVIL,MOTRIN) 200 MG tablet Take 200 mg by mouth every 6 (six) hours as needed for headache (pain).   Yes [provider]  Multiple Vitamin (MULTIVITAMIN WITH MINERALS) TABS tablet Take 1 tablet by mouth daily.   Yes [provider]  predniSONE (DELTASONE) 10 MG tablet Take 20 mg by mouth daily. 07/02/18  Yes [provider]  Psyllium (METAMUCIL PO) Take 15 mLs by mouth  daily. Mix in 8 oz water and drink   Yes [provider]  pyridostigmine (MESTINON) 60 MG tablet Take half tablet at 8am, 1pm, and 6pm Patient taking differently: Take 120 mg by mouth 3 (three) times daily. Take half tablet at 8am, 1pm, and 6pm 07/14/18  Yes Patel, Donika K, DO  predniSONE (DELTASONE) 20 MG tablet Take 1 tablet (20 mg total) by mouth 2 (two) times daily with a meal. Patient not taking: Reported on 07/17/2018 07/14/18   Alda Berthold, DO    Family History Family History  Problem Relation Age of Onset  . Pancreatic cancer Father   . Stroke Mother   . Aneurysm Brother   . Neuropathy Neg Hx     Social History Social History   Tobacco Use  . Smoking status: Former Smoker    Packs/day: 1.00    Years: 47.00    Pack years: 47.00    Types: Cigarettes    Last attempt to quit: 10/29/2000    Years since quitting: 17.7  . Smokeless tobacco: Never Used  Substance Use Topics  . Alcohol use: Yes    Alcohol/week: 1.0 standard drinks    Types: 1 Standard drinks or equivalent per week    Comment: socially- nightly 1 cocktail  . Drug use: No     Allergies   Pollen extract   Review of Systems Review of Systems  All other systems reviewed and are negative.    Physical Exam Updated Vital Signs BP 137/76   Pulse (!) 55   Temp 97.6 F (36.4 C) (Oral)   Resp 18   Ht 6' (1.829 m)   Wt 92.2 kg   SpO2 95%   BMI 27.57 kg/m   Physical Exam  Constitutional: He is oriented to person, place, and time. He appears well-developed and well-nourished.  HENT:  Head: Normocephalic and atraumatic.  Drooping of left eyelid  Eyes: Conjunctivae are normal.  Neck: Neck supple.  Cardiovascular: Normal rate and regular rhythm.  Pulmonary/Chest: Effort normal and breath sounds normal.  Abdominal: Soft. Bowel sounds are normal.  Musculoskeletal: Normal range of motion.  Neurological: He is alert and oriented to person, place, and time.  Able to move all 4 extremities    Skin: Skin is warm and dry.  Psychiatric: He has a normal mood and affect. His behavior is normal.  Nursing note and vitals reviewed.    ED Treatments / Results  Labs (all labs ordered are listed, but only abnormal results are displayed) Labs Reviewed  CBC WITH DIFFERENTIAL/PLATELET - Abnormal; Notable for the following components:      Result Value   Neutro Abs 7.8 (*)    All other components within normal limits  COMPREHENSIVE METABOLIC PANEL - Abnormal; Notable for the following components:   Glucose,  Bld 101 (*)    Calcium 8.8 (*)    Total Protein 5.9 (*)    All other components within normal limits    EKG None  Radiology No results found.  Procedures Procedures (including critical care time)  Medications Ordered in ED Medications  0.9 %  sodium chloride infusion ( Intravenous New Bag/Given 07/17/18 1219)     Initial Impression / Assessment and Plan / ED Course  I have reviewed the triage vital signs and the nursing notes.  Pertinent labs & imaging results that were available during my care of the patient were reviewed by me and considered in my medical decision making (see chart for details).     Patient with known diagnosis of myasthenia gravis presents with exacerbation of symptoms.  Discussed with his neurologist Dr. [name].  She recommended admission for IVIG.  Discussed with neuro hospitalist and medical hospitalist.  Final Clinical Impressions(s) / ED Diagnoses   Final diagnoses:  Myasthenia gravis [facility])    ED Discharge Orders    None       Nat Christen, MD 07/17/18 [phone]

## 2018-07-17 NOTE — Progress Notes (Signed)
Completed NIF and VC with pt. Pt completed -10 on NIF with great pt effort and stated that it was hard for him to complete because he has lost some control over is tongue and lips. Pt completed 500 ml on VC and in no respiratory distress on Room air.

## 2018-07-17 NOTE — ED Notes (Signed)
Report attempted 

## 2018-07-17 NOTE — Progress Notes (Signed)
RN present to discuss PIV needs. Attempted x2 and unsuccessful. RN states she will attempt.

## 2018-07-17 NOTE — Telephone Encounter (Signed)
I called patient's wife back and instructed her to take her husband to the ER.

## 2018-07-17 NOTE — Consult Note (Signed)
Requesting Physician: Dr. Lacinda Axon    Chief Complaint: Difficulty swallowing  History obtained from: Patient and Chart     HPI:                                                                                                                                       [name] is an 77 y.o. male with recently diagnosed seropositive MG presents to the ER after being sent by his Neurologist Dr. Narda Amber for progressive dysphagia.   Patient has been progressively having difficulty with swallowing, however over the last 2 days has gotten much worse and has been barely able to swallow. Initially presented in August with left eyelid ptosis and subsequently diagnosed MG. 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 CT chest negative for thymoma. Currently on 40 mg Prednisone daily and Mestinon 30 mg TID.   Denies any shortness of breath. Denies fatigue or weakness.    Past Medical History:  Diagnosis Date  . Colon polyp   . Diverticulosis    extensive 3'15.  . Dysrhythmia    hx. Ablation for tachycardia, no routine cardiology visits now  . ED (erectile dysfunction)   . Heart murmur    teenager  . History of colon polyps   . History of kidney stones    x1 passed  . Hyperlipidemia   . 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 Osteoarthritis   . Palpitation    with svt rate 150  . Tendinitis    achiles heel spur  . Testicular atrophy   . Tubular adenoma of colon     Past Surgical History:  Procedure Laterality Date  . ABLATION  2010   cardiac   . COLONOSCOPY    . EUS N/A 11/17/2014   Procedure: ESOPHAGEAL ENDOSCOPIC ULTRASOUND (EUS) RADIAL;  Surgeon: [name], MD;  [facility];  Service: Endoscopy;  Laterality: N/A;  . FINE NEEDLE ASPIRATION N/A 11/17/2014   Procedure: FINE NEEDLE ASPIRATION (FNA) LINEAR;  Surgeon: [name], MD;  [facility];  Service: Endoscopy;  Laterality: N/A;  + or- fna  . FLEXIBLE SIGMOIDOSCOPY    . HEMORRHOID SURGERY     banding   . HERNIA  REPAIR Bilateral    '05 bilateral hernia  . KNEE ARTHROSCOPY Left    scope '08  . KNEE ARTHROSCOPY    . LAPAROSCOPIC APPENDECTOMY N/A 09/05/2014   Procedure: APPENDECTOMY LAPAROSCOPIC;  Surgeon: [name], MD;  Location: [facility];  Service: General;  Laterality: N/A;  . ROTATOR CUFF REPAIR Right 2016    Family History  Problem Relation Age of Onset  . Pancreatic cancer Father   . Stroke Mother   . Aneurysm Brother   . Neuropathy Neg Hx    Social History:  reports that he quit smoking about 17 years ago. His smoking use included cigarettes. He has a 47.00 pack-year smoking history. He has never used smokeless tobacco.  He reports that he drinks about 1.0 standard drinks of alcohol per week. He reports that he does not use drugs.  Allergies:  Allergies  Allergen Reactions  . Pollen Extract Other (See Comments)    Nasal congestion    Medications:                                                                                                                        I reviewed home medications   ROS:                                                                                                                                     14 systems reviewed and negative except above    Examination:                                                                                                      General: Appears well-developed and well-nourished.  Psych: Affect appropriate to situation Eyes: No scleral injection HENT: No OP obstrucion Head: Normocephalic.  Cardiovascular: Normal rate and regular rhythm.  Respiratory: Effort normal and breath sounds normal to anterior ascultation GI: Soft.  No distension. There is no tenderness.  Skin: WDI    Neurological Examination Mental Status: Alert, oriented, thought content appropriate.  Speech fluent without evidence of aphasia. Able to follow 3 step commands without difficulty. Cranial Nerves: II: Visual fields grossly normal,   III,IV, VI: ptosis of left eyelid, EOMI but unable to sustain vertical gaze in left eye V,VII: bilateral facial weakness VIII: hearing normal bilaterally IX,X: uvula rises symmetrically XI: shoulder shrig norma; XII: midline tongue extension Motor: Right : Upper extremity   5/5    Left:     Upper extremity   5/5  Lower extremity   5/5     Lower extremity   5/5 Reduced strength during flexion of the neck Tone and bulk:normal tone throughout; no atrophy noted Sensory: Pinprick and light touch intact throughout, bilaterally Deep Tendon Reflexes: 2+  and symmetric throughout Plantars: Right: downgoing   Left: downgoing Cerebellar: normal finger-to-nose, normal rapid alternating movements and normal heel-to-shin test Gait: normal gait and station     Lab Results: Basic Metabolic Panel: Recent Labs  Lab 07/17/18 1218  NA 144  K 3.5  CL 104  CO2 30  GLUCOSE 101*  BUN 13  CREATININE 0.90  CALCIUM 8.8*    CBC: Recent Labs  Lab 07/17/18 1218  WBC 10.4  NEUTROABS 7.8*  HGB 14.9  HCT 46.6  MCV 90.8  PLT 230    Coagulation Studies: No results for input(s): LABPROT, INR in the last 72 hours.  Imaging: No results found.   No imaging    ASSESSMENT AND PLAN   77 y.o. male with recently diagnosed seropositive MG presents ptosis , facial weakness and worsening dysphagia consistent with myasthenia gravis exacerbation.  Does not have appear to have respiratory compromise, however has some neck flexion weakness and would monitor closely.   Myasthenia gravis exacerbation - IV IG 0.4g/kg x 5 days  - NIF and FVC q8h, caution during interpretation due to significant oraoharyngeal weakness - Continue home steroids - Check for underlying infection - Swallow eval before starting diet - Regular neurochecks   Cypher Paule [facility] Pager Number [phone]

## 2018-07-17 NOTE — ED Triage Notes (Addendum)
He reports that he was dx mystenia gravis about a month ago. Symptoms progressing and having issues swallowing. Started with eye closing on left side, droop- came in and was worked up for stroke. Confirmed it is MG. Pt here today for oral swelling and swallowing issues. Good airway clearance and can breathe fine. Talking in complete sentences with no distress. No new meds started recently. Some dosage adjustments.

## 2018-07-18 ENCOUNTER — Inpatient Hospital Stay (HOSPITAL_COMMUNITY): Payer: Medicare Other

## 2018-07-18 ENCOUNTER — Other Ambulatory Visit: Payer: Self-pay

## 2018-07-18 DIAGNOSIS — N400 Enlarged prostate without lower urinary tract symptoms: Secondary | ICD-10-CM

## 2018-07-18 DIAGNOSIS — E43 Unspecified severe protein-calorie malnutrition: Secondary | ICD-10-CM

## 2018-07-18 LAB — MRSA PCR SCREENING: MRSA by PCR: NEGATIVE

## 2018-07-18 MED ORDER — PRO-STAT SUGAR FREE PO LIQD
30.0000 mL | Freq: Two times a day (BID) | ORAL | Status: DC
  Administered 2018-07-18 – 2018-07-22 (×9): 30 mL
  Filled 2018-07-18 (×9): qty 30

## 2018-07-18 MED ORDER — IMMUNE GLOBULIN (HUMAN) 5 GM/50ML IV SOLN
400.0000 mg/kg | INTRAVENOUS | Status: AC
  Administered 2018-07-18 – 2018-07-22 (×5): 35 g via INTRAVENOUS
  Filled 2018-07-18 (×6): qty 50

## 2018-07-18 MED ORDER — JEVITY 1.2 CAL PO LIQD
1000.0000 mL | ORAL | Status: DC
  Administered 2018-07-18 – 2018-07-20 (×3): 1000 mL
  Filled 2018-07-18 (×6): qty 1000

## 2018-07-18 NOTE — Progress Notes (Signed)
@IPLOG @        PROGRESS NOTE                                                                                                                                                                                                             Patient Demographics:    [name], is a 77 y.o. male, DOB - 10/04/1941, ZOX:[id number]  Admit date - 07/17/2018   Admitting Physician Kinnie Feil, MD  Outpatient Primary MD for the patient is Hulan Fess, MD  LOS - 1  Chief Complaint  Patient presents with  . Myasthenia Gravis       Brief Narrative  [name] is a 77 y.o. male with PMH of BPH, HPL, recent diagnosis of myasthenia (05/2018) started on prednisone, mestinon presented with dysphasia. Patient states that he has been experiencing progressive difficulty with swallowing for two days. He reports worsening his right eye ptosis with diplopia along with difficulty in swallowing, he was diagnosed with myasthenia crisis and admitted to the hospital after neurology was consulted.   Subjective:    [name] today has, No headache, No chest pain, No abdominal pain - No Nausea, No new weakness tingling or numbness, No Cough - SOB.     Assessment  & Plan :     1.  Myasthenia gravis exacerbation.  Neuro on board, continue IVIG along with pyridostigmine and prednisone, continue continuous pulse ox monitoring, continue monitoring NIF 3 times a day, due to oropharyngeal weakness could be misleading.  He is currently in no distress in terms of shortness of breath.  Speech following and sees his swallow reflex is very poor hence feeding tube will be placed temporarily with dietitian consult.  Thymoma has been ruled out.  2.  BPH.  Continue alpha-blocker.  3.  Aphasia.  Seen by speech likely due to #1 above, feeding tube for now.  4.  Dyslipidemia.  On statin.    Family Communication  :  None  Code Status :  Full  Disposition Plan  :  Tele  Consults  :  Neuro  Procedures  :     DVT Prophylaxis  :  Lovenox    Lab Results  Component Value Date   PLT 230 07/17/2018    Diet :  Diet Order            Diet NPO time specified  Diet effective now               Inpatient Medications Scheduled Meds: . aspirin  325  mg Oral Daily  . atorvastatin  10 mg Oral q1800  . doxazosin  8 mg Oral Daily  . enoxaparin (LOVENOX) injection  40 mg Subcutaneous Q24H  . finasteride  5 mg Oral Daily  . predniSONE  40 mg Oral Q breakfast  . pyridostigmine  30 mg Oral 3 times per day  . vitamin B-12  1,000 mcg Oral Daily   Continuous Infusions: PRN Meds:.  Antibiotics  :   Anti-infectives (From admission, onward)   None          Objective:   Vitals:   07/17/18 1855 07/17/18 2107 07/18/18 0404 07/18/18 0728  BP: 135/85     Pulse: 67 73    Resp: 17 18    Temp:   97.6 F (36.4 C) 97.6 F (36.4 C)  TempSrc:   Oral Oral  SpO2: 96% 95%    Weight:      Height:        Wt Readings from Last 3 Encounters:  07/17/18 88.1 kg  07/02/18 92.3 kg  06/15/18 93.9 kg     Intake/Output Summary (Last 24 hours) at 07/18/2018 1121 Last data filed at 07/18/2018 0728 Gross per 24 hour  Intake -  Output 900 ml  Net -900 ml     Physical Exam  Awake Alert, Oriented X 3, No new F.N deficits, Normal affect [place].AT,PERRAL, L ptosis. Supple Neck,No JVD, No cervical lymphadenopathy appriciated.  Symmetrical Chest wall movement, Good air movement bilaterally, CTAB RRR,No Gallops,Rubs or new Murmurs, No Parasternal Heave +ve B.Sounds, Abd Soft, No tenderness, No organomegaly appriciated, No rebound - guarding or rigidity. No Cyanosis, Clubbing or edema, No new Rash or bruise      Data Review:    CBC Recent Labs  Lab 07/17/18 1218  WBC 10.4  HGB 14.9  HCT 46.6  PLT 230  MCV 90.8  MCH 29.0  MCHC 32.0  RDW 13.2  LYMPHSABS 1.8  MONOABS 0.7  EOSABS 0.1  BASOSABS 0.0    Chemistries  Recent Labs  Lab 07/17/18 1218  NA 144  K 3.5  CL 104  CO2 30  GLUCOSE  101*  BUN 13  CREATININE 0.90  CALCIUM 8.8*  AST 18  ALT 21  ALKPHOS 62  BILITOT 1.0   ------------------------------------------------------------------------------------------------------------------ No results for input(s): CHOL, HDL, LDLCALC, TRIG, CHOLHDL, LDLDIRECT in the last 72 hours.  Lab Results  Component Value Date   HGBA1C 5.3 06/14/2018   ------------------------------------------------------------------------------------------------------------------ No results for input(s): TSH, T4TOTAL, T3FREE, THYROIDAB in the last 72 hours.  Invalid input(s): FREET3 ------------------------------------------------------------------------------------------------------------------ No results for input(s): VITAMINB12, FOLATE, FERRITIN, TIBC, IRON, RETICCTPCT in the last 72 hours.  Coagulation profile No results for input(s): INR, PROTIME in the last 168 hours.  No results for input(s): DDIMER in the last 72 hours.  Cardiac Enzymes No results for input(s): CKMB, TROPONINI, MYOGLOBIN in the last 168 hours.  Invalid input(s): CK ------------------------------------------------------------------------------------------------------------------ No results found for: BNP  Micro Results Recent Results (from the past 240 hour(s))  MRSA PCR Screening     Status: None   Collection Time: 07/18/18  4:13 AM  Result Value Ref Range Status   MRSA by PCR NEGATIVE NEGATIVE Final    Comment:        The GeneXpert MRSA Assay (FDA approved for NASAL specimens only), is one component of a comprehensive MRSA colonization surveillance program. It is not intended to diagnose MRSA infection nor to guide or monitor treatment for MRSA infections. Performed at [facility],  [address], [place], [place] [address]     Radiology Reports Ct Chest W Contrast  Result Date: 07/11/2018 CLINICAL DATA:  Back pain.  Evaluate thymus gland. EXAM: CT CHEST WITH CONTRAST TECHNIQUE:  Multidetector CT imaging of the chest was performed during intravenous contrast administration. CONTRAST:  5mL ISOVUE-300 IOPAMIDOL (ISOVUE-300) INJECTION 61% COMPARISON:  10/27/2014 FINDINGS: Cardiovascular: The heart size appears within normal limits. No pericardial effusion. Aortic atherosclerosis. Calcification in the RCA noted. Mediastinum/Nodes: Normal appearance of the thyroid gland. The trachea appears patent and is midline. Normal appearance of the esophagus. No residual or recurrent thymus tissue identified. No mediastinal mass identified. No mediastinal or hilar adenopathy. Lungs/Pleura: Mild to moderate changes of emphysema identified. No pleural effusion noted. Faint ground-glass attenuating nodule within the right upper lobe measures 8 mm and is stable from 10/27/2014. Chronic appearing scar like densities noted within the lower lobes bilaterally. Unchanged from previous exam. New subpleural nodule within the left upper lobe is nonspecific measuring 6 mm, image 41/5. Upper Abdomen: Scattered simple appearing liver cysts are again identified involving both lobes. The gallbladder appears within normal limits. No biliary ductal dilatation. Indeterminate intermediate attenuating structure within the left upper quadrant of the abdomen measures 3.2 cm by 2.1 cm. On the previous exam this measured 3.5 by 2.3 cm. This is compatible with a benign abnormality. Musculoskeletal: Spondylosis identified within the thoracic spine. No aggressive lytic or sclerotic bone lesions. IMPRESSION: 1. No evidence for residual or recurrent thymus tissue or thymic neoplasm. 2. Unchanged 8 mm ground-glass attenuating nodule within the right upper lobe measuring 8 mm. Repeat CT is recommended every 2 years until 5 years of stability has been established. Five years of documented stability would be on 10/28/2019. This recommendation follows the consensus statement: Guidelines for Management of Incidental Pulmonary Nodules Detected  on CT Images: From the Fleischner Society 2017; Radiology 2017; 284:228-243. 3. New solid nodule in the left upper lobe measures 6 mm. Non-contrast chest CT at 6-12 months is recommended. If the nodule is stable at time of repeat CT, then future CT at 18-24 months (from today's scan) is considered optional for low-risk patients, but is recommended for high-risk patients. This recommendation follows the consensus statement: Guidelines for Management of Incidental Pulmonary Nodules Detected on CT Images: From the Fleischner Society 2017; Radiology 2017; 284:228-243. 4. Aortic Atherosclerosis (ICD10-I70.0) and Emphysema (ICD10-J43.9). Electronically Signed   By: Kerby Moors M.D.   On: 07/11/2018 17:26   Mr Cervical Spine Wo Contrast  Addendum Date: 07/14/2018   ADDENDUM REPORT: 07/14/2018 12:39 ADDENDUM: Please note that impression #2 should read "foraminal impingement that is advanced on the left at C3-4 and C5-6." Electronically Signed   By: Monte Fantasia M.D.   On: 07/14/2018 12:39   Result Date: 07/14/2018 CLINICAL DATA:  Myasthenia gravis with exacerbation. Neck pain when standing for 3-4 weeks. EXAM: MRI CERVICAL SPINE WITHOUT CONTRAST TECHNIQUE: Multiplanar, multisequence MR imaging of the cervical spine was performed. No intravenous contrast was administered. COMPARISON:  Neck CTA 06/14/2018 FINDINGS: Alignment: Borderline anterolisthesis at C6-7 Vertebrae: Heterogeneous marrow without focal lesion. No discitis or fracture. Cord: Normal signal and morphology Posterior Fossa, vertebral arteries, paraspinal tissues: Negative Disc levels: C2-3: Facet spurring with probable ankylosis on the left. No herniation or impingement C3-4: Disc narrowing with asymmetric left endplate and uncovertebral ridging. Asymmetric left facet spurring with ligamentum flavum thickening. Advanced left foraminal stenosis. Mild left canal narrowing. Patent right foramen C4-5: Disc narrowing with uncovertebral ridging. Mild  facet spurring greater  on the left. Mild bilateral foraminal narrowing. Patent canal C5-6: Disc narrowing with uncovertebral ridging asymmetric to the left. Negative facets. Mild ligamentum flavum buckling. The canal is patent. Advanced left foraminal stenosis. Moderate appearing right foraminal narrowing that is more likely mild based on recent CTA. C6-7: Disc narrowing with shallow central protrusion. Disc narrowing causes mild bilateral foraminal narrowing. C7-T1:Mild facet spurring on the right. No herniation or impingement. IMPRESSION: 1. No acute finding. 2. Disc and facet degeneration causes foraminal impingement that is advanced on the left at L3-4 and on the left at C5-6. 3. Mild right foraminal narrowing at C5-6 and C6-7. Electronically Signed: By: Monte Fantasia M.D. On: 07/11/2018 15:07    Time Spent in minutes  30   Lala Lund M.D on 07/18/2018 at 11:21 AM  To page go to [email or web address] - password [facility]

## 2018-07-18 NOTE — Progress Notes (Signed)
Patient alert and oriented x 4, denies pain with assess throughout shift.  Overall speech, decreased oral secrections and generalized weakness has improved during shift.  Patient has regained some functioning to left eye.  Rested well throughout night and verbalized that he is able to swallow periodically. Stable condition at end of shift, will continue to monitor.

## 2018-07-18 NOTE — Progress Notes (Addendum)
Initial Nutrition Assessment  DOCUMENTATION CODES:   Severe malnutrition in context of acute illness/injury  INTERVENTION:   If unable to advance diet, recommend placement of a Cortrak nasogastric feeding tube and initiation of enteral nutrition. Cortrak team is available M-W-F-Sa.  Addendum: Once Cortrak placed, initiate TF via Cortrak: - Start Jevity 1.2 @ 20 ml/hr and increase by 10 ml q 8 hours until goal rate of 60 ml/hr is reached - Pro-stat 30 ml BID - Free water flushes per MD  Tube feeding regimen at goal rate provides 1928 kcal, 110 grams of protein, and 1166 ml of H2O. (100% of needs)  NUTRITION DIAGNOSIS:   Severe Malnutrition related to dysphagia, acute illness (acute exacerbation of myasthenia gravis) as evidenced by mild fat depletion, moderate fat depletion, mild muscle depletion, moderate muscle depletion, percent weight loss (6.2% weight loss in 1 month).  GOAL:   Patient will meet greater than or equal to 90% of their needs  MONITOR:   Diet advancement, I & O's, Labs, Weight trends  REASON FOR ASSESSMENT:   Malnutrition Screening Tool, Consult Enteral/tube feeding initiation and management  ASSESSMENT:   77 year old male who presented to the ED on 9/19 with dysphasia, dysarthria, and ptosis of the left eye. Pt was recently diagnosed with Myasthenia Gravis. PMH significant for hyperlipidemia, diverticulosis, and osteoarthritis. Pt admitted with acute exacerbation of myasthenia for IVIG x 5 days.  Spoke with pt at bedside. Pt resting in recliner using Yankauer to suction secretions from mouth. Upon arrival, pt states that he is "very hungry."  Pt endorses a recent loss of appetite (over the past 10 to 14 days) related to MG symptoms including difficulty swallowing. Pt states that during this timeframe, he has been eating less (smaller portions) and has lost 14 lbs. Pt shares that he has still been eating 3 meals daily but has been focusing on having softer  foods (example: tuna fish, eggs) to ease in swallowing. Pt denies difficulties chewing or swallowing prior to MG diagnosis.  Breakfast: bagel and orange juice OR frozen waffles OR eggs Lunch: sandwich and soda Dinner: meat, vegetable, starch  Pt denies snacking between meals.  Pt reports his UBW as 200 lbs and that he last weighed this "10 days ago." Per weight history in chart, pt has lost 12.7 lbs over the past 1 month. This is a 6.2% weight loss which is significant for timeframe. Pt states that he was "going to start drinking Ensure, but then I ended up here." Pt agreeable to receiving an oral nutrition supplement supplement once diet advanced.  Pt states that he is still as active as he is able to be and ambulates without assistance.  Addendum: Pt with Cortrak placement planned for this afternoon. Consult received to initiate tube feeding. Will place orders that can be initiated once Cortrak placement completed.  Medications reviewed and include: 1000 mcg vitamin B-12  Labs reviewed.  NUTRITION - FOCUSED PHYSICAL EXAM:    Most Recent Value  Orbital Region  Mild depletion  Upper Arm Region  Moderate depletion  Thoracic and Lumbar Region  Mild depletion  Buccal Region  Mild depletion  Temple Region  Moderate depletion  Clavicle Bone Region  Moderate depletion  Clavicle and Acromion Bone Region  Moderate depletion  Scapular Bone Region  Mild depletion  Dorsal Hand  Mild depletion  Patellar Region  Mild depletion  Anterior Thigh Region  Moderate depletion  Posterior Calf Region  Mild depletion  Edema (RD Assessment)  None  Hair  Reviewed  Eyes  Reviewed  Mouth  Reviewed  Skin  Reviewed  Nails  Reviewed       Diet Order:   Diet Order            Diet NPO time specified  Diet effective now              EDUCATION NEEDS:   No education needs have been identified at this time  Skin:  Skin Assessment: Reviewed RN Assessment  Last BM:  07/16/18  Height:   Ht  Readings from Last 1 Encounters:  07/17/18 6' (1.829 m)    Weight:   Wt Readings from Last 1 Encounters:  07/17/18 88.1 kg    Ideal Body Weight:  80.91 kg  BMI:  Body mass index is 26.34 kg/m.  Estimated Nutritional Needs:   Kcal:  1800-2000  Protein:  95-110 grams  Fluid:  1.8-2.0    Gaynell Face, MS, RD, LDN Inpatient Clinical Dietitian Pager: [phone] Weekend/After Hours: [phone]

## 2018-07-18 NOTE — Progress Notes (Signed)
Modified Barium Swallow Progress Note  Patient Details  Name: [name] MRN: [id number] Date of Birth: 06-Mar-1941  Today's Date: 07/18/2018  Modified Barium Swallow completed.  Full report located under Chart Review in the Imaging Section.  Brief recommendations include the following:  Clinical Impression  Pt demonstrates a severe oropharyngeal dysphagia secondary to neuromuscular weakness resulting from early onset of mysthenia gravis. Weakness in CN III, CN VII and XII (left slightly greather than right) was evident in decrease eyelid retraction, decreased lingual and labial tenson and strength. There is anterior spillage and poor bolus formation and lingual propulsion with more passive tactics used for pharyngeal transit. Severe weakness observed in CN IX and X given almost absent innervation of musculature for bolus propulsion including the pharyngeal constrictors, base of tongue propulsion, hyoid excursion. Absent pharyngeal stripping and only minimal mobility of other pharyngeal musculature for laryngeal elevation and partial tilt of larynx and UES opening. Most of the bolus is penetrated before, during and after the swallow. Pts strength is adequate for adduction of arytenoids and laryngeal sensation allowing him to protect his airway to the best of his ability. Given signs of L>R CN VII weakness, AP view also utilized to determine if pharyngeal weakness was symmetrical, which it was.   Though small sized boluses were attempted with thin, nectar and honey, swallow trigger did not improve and strength so profoundly impaired that no strategies or techniques were sufficient to allow safe, functional PO intake at this time. Recommend pt remain NPO with short term alternate means of nutrition as treatment hopefully targets CN function. Exercises are not appropriate until acute treatment complete as there is high risk of muscle/respiratory fatigue. Will f/u for conservative trials and signs of  subjective improvement.    Swallow Evaluation Recommendations       SLP Diet Recommendations: NPO;Alternative means - temporary                              [name], MA CCC-SLP [phone]   [name] 07/18/2018,11:43 AM

## 2018-07-18 NOTE — Progress Notes (Signed)
Cortrak Tube Team Note:  Consult received to place a Cortrak feeding tube.   A 10 F Cortrak tube was placed in the RIGHT nare and secured with a nasal bridle at 72 cm. Per the Cortrak monitor reading the tube tip is gastric, at the pylorus.   No x-ray is required. RN may begin using tube.   If the tube becomes dislodged please keep the tube and contact the Cortrak team at [email or web address] (password [email or web address]) for replacement.  If after hours and replacement cannot be delayed, place a NG tube and confirm placement with an abdominal x-ray.    Gaynell Face, MS, RD, LDN Inpatient Clinical Dietitian Pager: [phone] Weekend/After Hours: [phone]

## 2018-07-18 NOTE — Progress Notes (Signed)
NIF: -16 VC: .4L  Patient was unable to get a good seal to preform NIF & VC adequately due to loosing partial control of his lips and tongue.

## 2018-07-18 NOTE — Plan of Care (Signed)

## 2018-07-18 NOTE — Progress Notes (Addendum)
NEURO HOSPITALIST PROGRESS NOTE   Subjective: Patient awake, alert, family at bedside, NG tube in place, NAD. Patient is in good spirits and states that he actually is able to handle his secretions better today. Still not able to swallow. Speech recommended for patient to remain NPO. Family states that his left eye droop has never really improved. The eye just stays open longer after waking.  NIF: -16  VC: 4L    Exam: Vitals:   07/18/18 0404 07/18/18 0728  BP:    Pulse:    Resp:    Temp: 97.6 F (36.4 C) 97.6 F (36.4 C)  SpO2:      Physical Exam   HEENT-  Normocephalic, no lesions, without obvious abnormality.  Normal external eye and conjunctiva.   Cardiovascular- S1-S2 audible, pulses palpable throughout   Lungs-no rhonchi or wheezing noted, no excessive working breathing.  Saturations within normal limits on RA Abdomen- All 4 quadrants palpated and nontender Extremities- Warm, dry and intact Musculoskeletal-no joint tenderness, deformity or swelling Skin-warm and dry, no hyperpigmentation, vitiligo, or suspicious lesions   Neuro:  Mental Status: Alert, oriented, thought content appropriate.  Speech fluent without evidence of aphasia.  Able to follow commands without difficulty. Cranial Nerves: II:  Visual fields grossly normal,  III,IV, VI: ptosis Present in left eye so much that it is completely closed, extra-ocular motions intact bilaterally pupils equal, round, reactive to light and accommodation V,VII: smile symmetric, facial light touch sensation normal bilaterally VIII: hearing normal bilaterally IX,X: uvula rises symmetrically XI: bilateral shoulder shrug XII: midline tongue extension Motor: Right : Upper extremity   5/5    Left:     Upper extremity   5/5  Lower extremity   5/5     Lower extremity   5/5 Tone and bulk:normal tone throughout; no atrophy noted Sensory: light touch intact throughout, bilaterally Deep Tendon Reflexes: 2+ and  symmetric biceps, patella Plantars: Right: downgoing   Left: downgoing Cerebellar: normal finger-to-nose, normal rapid alternating movements and normal heel-to-shin test Gait: deferred    Medications:  Scheduled: . aspirin  325 mg Oral Daily  . atorvastatin  10 mg Oral q1800  . doxazosin  8 mg Oral Daily  . enoxaparin (LOVENOX) injection  40 mg Subcutaneous Q24H  . feeding supplement (PRO-STAT SUGAR FREE 64)  30 mL Per Tube BID  . finasteride  5 mg Oral Daily  . predniSONE  40 mg Oral Q breakfast  . pyridostigmine  30 mg Oral 3 times per day  . vitamin B-12  1,000 mcg Oral Daily   Continuous: . feeding supplement (JEVITY 1.2 CAL)     PRN:  Pertinent Labs/Diagnostics:  Respiratory: NIF: -16 VC: .4L  Patient was unable to get a good seal to preform NIF & VC adequately due to loosing partial control of his lips and tongue.   Dg Swallowing Func-speech Pathology  Result Date: 07/18/2018 Objective Swallowing Evaluation: No data recorded Patient Details Name: Masayoshi Couzens MRN: [id number] Date of Birth: 05/04/1941 Today's Date: 07/18/2018 Time: SLP Start Time (ACUTE ONLY): 1000 -SLP Stop Time (ACUTE ONLY): 1026 SLP Time Calculation (min) (ACUTE ONLY): 26 min Past Medical History: Past Medical History: Diagnosis Date . Colon polyp  . Diverticulosis   extensive 3'15. . Dysrhythmia   hx. Ablation for tachycardia, no routine cardiology visits now . ED (erectile dysfunction)  . Heart murmur   teenager .  History of colon polyps  . History of kidney stones   x1 passed . Hyperlipidemia  . 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 Osteoarthritis  . Palpitation   with svt rate 150 . Tendinitis   achiles heel spur . Testicular atrophy  . Tubular adenoma of colon  Past Surgical History: Past Surgical History: Procedure Laterality Date . ABLATION  2010  cardiac  . COLONOSCOPY   . EUS N/A 11/17/2014  Procedure: ESOPHAGEAL ENDOSCOPIC ULTRASOUND (EUS) RADIAL;  Surgeon: [name], MD;  Location: [facility]  ENDOSCOPY;  Service: Endoscopy;  Laterality: N/A; . FINE NEEDLE ASPIRATION N/A 11/17/2014  Procedure: FINE NEEDLE ASPIRATION (FNA) LINEAR;  Surgeon: [name], MD;  Location: [facility] ENDOSCOPY;  Service: Endoscopy;  Laterality: N/A;  + or- fna . FLEXIBLE SIGMOIDOSCOPY   . HEMORRHOID SURGERY    banding  . HERNIA REPAIR Bilateral   '05 bilateral hernia . KNEE ARTHROSCOPY Left   scope '08 . KNEE ARTHROSCOPY   . LAPAROSCOPIC APPENDECTOMY N/A 09/05/2014  Procedure: APPENDECTOMY LAPAROSCOPIC;  Surgeon: [name], MD;  Location: [facility];  Service: General;  Laterality: N/A; . ROTATOR CUFF REPAIR Right 2016 HPI: Olanda Downie is an 77 y.o. male with recently diagnosed seropositive MG presents to the ER after being sent by his Neurologist Dr. Narda Amber for progressive dysphagia. Patient has been progressively having difficulty with swallowing, however over the last 2 days has gotten much worse and has been barely able to swallow. Initially presented in August with left eyelid ptosis and subsequently diagnosed MG. 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 CT chest negative for thymoma. Currently on 40 mg Prednisone daily and Mestinon 30 mg TID.  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 Weakness in CN III, CN VII and XII (left slightly greather than right) was evident in decrease eyelid retraction, decreased lingual and labial tenson and strength. There is anterior spillage and poor bolus formation and lingual propulsion with more passive tactics used for pharyngeal transit. Severe weakness observed in CN IX and X given almost absent innervation of musculature for bolus propulsion including the pharyngeal constrictors, base of tongue propulsion, hyoid excursion. Absent pharyngeal stripping and only minimal mobility of other pharyngeal  musculature for laryngeal elevation and partial tilt of larynx and UES opening. Most of the bolus is penetrated before, during and after the swallow. Pts strength is adequate for adduction of arytenoids and laryngeal sensation allowing him to protect his airway to the best of his ability. Given signs of L>R CN VII weakness, AP view also utilized to determine if pharyngeal weakness was symmetrical, which it was. Though small sized boluses were attempted with thin, nectar and honey, swallow trigger did not improve and strength so profoundly impaired that no strategies or techniques were sufficient to allow safe, functional PO intake at this time. Recommend pt remain NPO with short term alternate means of nutrition as treatment hopefully targets CN function. Exercises are not appropriate until acute treatment complete as there is high risk of muscle/respiratory fatigue. Will f/u for conservative trials and signs of subjective improvement.  SLP Visit Diagnosis Dysphagia, oropharyngeal phase (R13.12) Attention and concentration deficit following -- Frontal lobe and executive function deficit following -- Impact on safety and function Severe aspiration risk;Risk for inadequate nutrition/hydration   No flowsheet data found.  No flowsheet data found. CHL IP DIET RECOMMENDATION 07/18/2018 SLP Diet Recommendations NPO;Alternative means -  temporary Liquid Administration via -- Medication Administration -- Compensations -- Postural Changes --   No flowsheet data found.  No flowsheet data found.  No flowsheet data found.     CHL IP ORAL PHASE 07/18/2018 Oral Phase Impaired Oral - Pudding Teaspoon -- Oral - Pudding Cup -- Oral - Honey Teaspoon -- Oral - Honey Cup Weak lingual manipulation;Lingual pumping;Incomplete tongue to palate contact;Reduced posterior propulsion;Lingual/palatal residue;Delayed oral transit;Decreased bolus cohesion Oral - Nectar Teaspoon Weak lingual manipulation;Lingual pumping;Incomplete tongue to palate  contact;Reduced posterior propulsion;Lingual/palatal residue;Delayed oral transit;Decreased bolus cohesion Oral - Nectar Cup -- Oral - Nectar Straw -- Oral - Thin Teaspoon Weak lingual manipulation;Lingual pumping;Incomplete tongue to palate contact;Reduced posterior propulsion;Lingual/palatal residue;Delayed oral transit;Decreased bolus cohesion Oral - Thin Cup -- Oral - Thin Straw -- Oral - Puree -- Oral - Mech Soft -- Oral - Regular -- Oral - Multi-Consistency -- Oral - Pill -- Oral Phase - Comment --  CHL IP PHARYNGEAL PHASE 07/18/2018 Pharyngeal Phase Impaired Pharyngeal- Pudding Teaspoon -- Pharyngeal -- Pharyngeal- Pudding Cup -- Pharyngeal -- Pharyngeal- Honey Teaspoon -- Pharyngeal -- Pharyngeal- Honey Cup Reduced pharyngeal peristalsis;Reduced epiglottic inversion;Reduced anterior laryngeal mobility;Reduced tongue base retraction;Delayed swallow initiation-pyriform sinuses;Penetration/Aspiration before swallow;Penetration/Aspiration during swallow;Penetration/Apiration after swallow;Pharyngeal residue - valleculae;Pharyngeal residue - pyriform;Moderate aspiration Pharyngeal Material enters airway, passes BELOW cords and not ejected out despite cough attempt by patient Pharyngeal- Nectar Teaspoon Reduced pharyngeal peristalsis;Reduced epiglottic inversion;Reduced anterior laryngeal mobility;Reduced tongue base retraction;Delayed swallow initiation-pyriform sinuses;Penetration/Aspiration before swallow;Penetration/Aspiration during swallow;Penetration/Apiration after swallow;Pharyngeal residue - valleculae;Pharyngeal residue - pyriform;Trace aspiration Pharyngeal Material enters airway, passes BELOW cords and not ejected out despite cough attempt by patient;Material enters airway, CONTACTS cords and then ejected out;Material does not enter airway Pharyngeal- Nectar Cup -- Pharyngeal -- Pharyngeal- Nectar Straw -- Pharyngeal -- Pharyngeal- Thin Teaspoon -- Pharyngeal -- Pharyngeal- Thin Cup -- Pharyngeal --  Pharyngeal- Thin Straw -- Pharyngeal -- Pharyngeal- Puree -- Pharyngeal -- Pharyngeal- Mechanical Soft -- Pharyngeal -- Pharyngeal- Regular -- Pharyngeal -- Pharyngeal- Multi-consistency -- Pharyngeal -- Pharyngeal- Pill -- Pharyngeal -- Pharyngeal Comment --  No flowsheet data found. DeBlois, Katherene Ponto 07/18/2018, 11:44 AM              Assessment:  77 y.o. male with recently diagnosed seropositive MG presents ptosis , facial weakness and worsening dysphagia consistent with myasthenia gravis exacerbation.  Does not have appear to have respiratory compromise, however has some neck flexion weakness and would monitor closely.  IVIG administered yesterday for unclear reason.    Recommendations:  - Start IV IG 0.4g/kg x 5 days   - NIF and FVC q8h, caution during interpretation due to significant oraoharyngeal weakness - Continue home steroids - Check for underlying infection - Failed swallow eval, agree with NG tube - Regular neuro checks -Neurology will continue to follow  Laurey Morale, MSN, NP-C [facility] [phone]   Attending neurologist's note to follow    07/18/2018, 12:46 PM    NEUROHOSPITALIST ADDENDUM Performed a face to face diagnostic evaluation.   I have reviewed the contents of history and physical exam as documented by PA/ARNP/Resident and agree with above documentation.  I have discussed and formulated the above plan as documented. Edits to the note have been made as needed.  Patient has myasthenia gravis with significant bulbar and facial weakness.  He failed his swallow evaluation and agree with NG tube. He will receive his first dose of IVIG today.  We will continue to follow.    Karena Addison Leeanne Butters MD [facility] [phone]  If 7pm to 7am, please call on call as listed on AMION.

## 2018-07-19 LAB — CBC
HCT: 45.3 % (ref 39.0–52.0)
Hemoglobin: 14.6 g/dL (ref 13.0–17.0)
MCH: 28.8 pg (ref 26.0–34.0)
MCHC: 32.2 g/dL (ref 30.0–36.0)
MCV: 89.3 fL (ref 78.0–100.0)
Platelets: 233 10*3/uL (ref 150–400)
RBC: 5.07 MIL/uL (ref 4.22–5.81)
RDW: 13.2 % (ref 11.5–15.5)
WBC: 7.8 10*3/uL (ref 4.0–10.5)

## 2018-07-19 LAB — BASIC METABOLIC PANEL
Anion gap: 8 (ref 5–15)
BUN: 14 mg/dL (ref 8–23)
CO2: 29 mmol/L (ref 22–32)
Calcium: 8.9 mg/dL (ref 8.9–10.3)
Chloride: 104 mmol/L (ref 98–111)
Creatinine, Ser: 0.81 mg/dL (ref 0.61–1.24)
GFR calc Af Amer: >60 mL/min (ref >60)
GFR calc non Af Amer: >60 mL/min (ref >60)
Glucose, Bld: 120 mg/dL — ABNORMAL HIGH (ref 70–99)
Potassium: 3.7 mmol/L (ref 3.5–5.1)
Sodium: 141 mmol/L (ref 135–145)

## 2018-07-19 LAB — MAGNESIUM: Magnesium: 2.4 mg/dL (ref 1.7–2.4)

## 2018-07-19 LAB — PHOSPHORUS: Phosphorus: 4.4 mg/dL (ref 2.5–4.6)

## 2018-07-19 MED ORDER — METOPROLOL TARTRATE 50 MG PO TABS
50.0000 mg | ORAL_TABLET | Freq: Two times a day (BID) | ORAL | Status: DC
  Administered 2018-07-19 – 2018-07-23 (×8): 50 mg
  Filled 2018-07-19 (×8): qty 1

## 2018-07-19 NOTE — Plan of Care (Signed)

## 2018-07-19 NOTE — Progress Notes (Signed)
@IPLOG @        PROGRESS NOTE                                                                                                                                                                                                             Patient Demographics:    [name], is a 77 y.o. male, DOB - Oct 11, 1941, GGY:[id number]  Admit date - 07/17/2018   Admitting Physician Kinnie Feil, MD  Outpatient Primary MD for the patient is Hulan Fess, MD  LOS - 2  Chief Complaint  Patient presents with  . Myasthenia Gravis       Brief Narrative  [name] is a 77 y.o. male with PMH of BPH, HPL, recent diagnosis of myasthenia (05/2018) started on prednisone, mestinon presented with dysphasia. Patient states that he has been experiencing progressive difficulty with swallowing for two days. He reports worsening his right eye ptosis with diplopia along with difficulty in swallowing, he was diagnosed with myasthenia crisis and admitted to the hospital after neurology was consulted.   Subjective:   Patient in bed, appears comfortable, denies any headache, no fever, no chest pain or pressure, no shortness of breath , no abdominal pain. No focal weakness.   Assessment  & Plan :     1.  Myasthenia gravis exacerbation.  Neuro on board, continue IVIG along with pyridostigmine and prednisone, continue continuous pulse ox monitoring, continue monitoring NIF 3 times a day, due to oropharyngeal weakness could be misleading.  He is currently in no distress in terms of shortness of breath.  Speech following and sees his swallow reflex is very poor hence NG tube was placed and tube feeding was started under dietary consultation on 07/18/2018, thymoma has been ruled out.  2.  BPH.  Continue alpha-blocker.  3.  Aphasia.  Seen by speech likely due to #1 above, feeding tube for now.  4.  Dyslipidemia.  On statin.  5.  Hypertension.  Lopressor twice daily added with NG tube.  We will continue to  monitor.  Family Communication  :  None  Code Status :  Full  Disposition Plan  :  Tele  Consults  :  Neuro  Procedures  :    DVT Prophylaxis  :  Lovenox    Lab Results  Component Value Date   PLT 233 07/19/2018    Diet :  Diet Order            Diet NPO time specified  Diet effective now  Inpatient Medications Scheduled Meds: . aspirin  325 mg Oral Daily  . atorvastatin  10 mg Oral q1800  . doxazosin  8 mg Oral Daily  . enoxaparin (LOVENOX) injection  40 mg Subcutaneous Q24H  . feeding supplement (PRO-STAT SUGAR FREE 64)  30 mL Per Tube BID  . finasteride  5 mg Oral Daily  . Immune Globulin 10%  400 mg/kg Intravenous Q24 Hr x 5  . predniSONE  40 mg Oral Q breakfast  . pyridostigmine  30 mg Oral 3 times per day  . vitamin B-12  1,000 mcg Oral Daily   Continuous Infusions: . feeding supplement (JEVITY 1.2 CAL) 40 mL/hr (07/19/18 0911)   PRN Meds:.  Antibiotics  :   Anti-infectives (From admission, onward)   None          Objective:   Vitals:   07/18/18 1721 07/18/18 1741 07/18/18 1847 07/19/18 0854  BP: (!) 150/85 135/78 (!) 146/85 (!) 144/90  Pulse: 75 79 78   Resp:      Temp: 97.6 F (36.4 C) 97.8 F (36.6 C) 97.6 F (36.4 C) 97.6 F (36.4 C)  TempSrc: Oral Oral Oral   SpO2: 96%     Weight:      Height:        Wt Readings from Last 3 Encounters:  07/17/18 88.1 kg  07/02/18 92.3 kg  06/15/18 93.9 kg     Intake/Output Summary (Last 24 hours) at 07/19/2018 0954 Last data filed at 07/19/2018 0654 Gross per 24 hour  Intake 266 ml  Output 750 ml  Net -484 ml     Physical Exam  Awake Alert, Oriented X 3, No new F.N deficits, Normal affect [place].AT,PERRAL, mildly improved left eye ptosis, NG tube in place Supple Neck,No JVD, No cervical lymphadenopathy appriciated.  Symmetrical Chest wall movement, Good air movement bilaterally, CTAB RRR,No Gallops, Rubs or new Murmurs, No Parasternal Heave +ve B.Sounds, Abd Soft, No  tenderness, No organomegaly appriciated, No rebound - guarding or rigidity. No Cyanosis, Clubbing or edema, No new Rash or bruise      Data Review:    CBC Recent Labs  Lab 07/17/18 1218 07/19/18 0452  WBC 10.4 7.8  HGB 14.9 14.6  HCT 46.6 45.3  PLT 230 233  MCV 90.8 89.3  MCH 29.0 28.8  MCHC 32.0 32.2  RDW 13.2 13.2  LYMPHSABS 1.8  --   MONOABS 0.7  --   EOSABS 0.1  --   BASOSABS 0.0  --     Chemistries  Recent Labs  Lab 07/17/18 1218 07/19/18 0452  NA 144 141  K 3.5 3.7  CL 104 104  CO2 30 29  GLUCOSE 101* 120*  BUN 13 14  CREATININE 0.90 0.81  CALCIUM 8.8* 8.9  MG  --  2.4  AST 18  --   ALT 21  --   ALKPHOS 62  --   BILITOT 1.0  --    ------------------------------------------------------------------------------------------------------------------ No results for input(s): CHOL, HDL, LDLCALC, TRIG, CHOLHDL, LDLDIRECT in the last 72 hours.  Lab Results  Component Value Date   HGBA1C 5.3 06/14/2018   ------------------------------------------------------------------------------------------------------------------ No results for input(s): TSH, T4TOTAL, T3FREE, THYROIDAB in the last 72 hours.  Invalid input(s): FREET3 ------------------------------------------------------------------------------------------------------------------ No results for input(s): VITAMINB12, FOLATE, FERRITIN, TIBC, IRON, RETICCTPCT in the last 72 hours.  Coagulation profile No results for input(s): INR, PROTIME in the last 168 hours.  No results for input(s): DDIMER in the last 72 hours.  Cardiac Enzymes No results for  input(s): CKMB, TROPONINI, MYOGLOBIN in the last 168 hours.  Invalid input(s): CK ------------------------------------------------------------------------------------------------------------------ No results found for: BNP  Micro Results Recent Results (from the past 240 hour(s))  MRSA PCR Screening     Status: None   Collection Time: 07/18/18  4:13 AM   Result Value Ref Range Status   MRSA by PCR NEGATIVE NEGATIVE Final    Comment:        The GeneXpert MRSA Assay (FDA approved for NASAL specimens only), is one component of a comprehensive MRSA colonization surveillance program. It is not intended to diagnose MRSA infection nor to guide or monitor treatment for MRSA infections. Performed at [facility], [place] [address] [place], [place] [address]     Radiology Reports Ct Chest W Contrast  Result Date: 07/11/2018 CLINICAL DATA:  Back pain.  Evaluate thymus gland. EXAM: CT CHEST WITH CONTRAST TECHNIQUE: Multidetector CT imaging of the chest was performed during intravenous contrast administration. CONTRAST:  15mL ISOVUE-300 IOPAMIDOL (ISOVUE-300) INJECTION 61% COMPARISON:  10/27/2014 FINDINGS: Cardiovascular: The heart size appears within normal limits. No pericardial effusion. Aortic atherosclerosis. Calcification in the RCA noted. Mediastinum/Nodes: Normal appearance of the thyroid gland. The trachea appears patent and is midline. Normal appearance of the esophagus. No residual or recurrent thymus tissue identified. No mediastinal mass identified. No mediastinal or hilar adenopathy. Lungs/Pleura: Mild to moderate changes of emphysema identified. No pleural effusion noted. Faint ground-glass attenuating nodule within the right upper lobe measures 8 mm and is stable from 10/27/2014. Chronic appearing scar like densities noted within the lower lobes bilaterally. Unchanged from previous exam. New subpleural nodule within the left upper lobe is nonspecific measuring 6 mm, image 41/5. Upper Abdomen: Scattered simple appearing liver cysts are again identified involving both lobes. The gallbladder appears within normal limits. No biliary ductal dilatation. Indeterminate intermediate attenuating structure within the left upper quadrant of the abdomen measures 3.2 cm by 2.1 cm. On the previous exam this measured 3.5 by 2.3 cm. This is compatible  with a benign abnormality. Musculoskeletal: Spondylosis identified within the thoracic spine. No aggressive lytic or sclerotic bone lesions. IMPRESSION: 1. No evidence for residual or recurrent thymus tissue or thymic neoplasm. 2. Unchanged 8 mm ground-glass attenuating nodule within the right upper lobe measuring 8 mm. Repeat CT is recommended every 2 years until 5 years of stability has been established. Five years of documented stability would be on 10/28/2019. This recommendation follows the consensus statement: Guidelines for Management of Incidental Pulmonary Nodules Detected on CT Images: From the Fleischner Society 2017; Radiology 2017; 284:228-243. 3. New solid nodule in the left upper lobe measures 6 mm. Non-contrast chest CT at 6-12 months is recommended. If the nodule is stable at time of repeat CT, then future CT at 18-24 months (from today's scan) is considered optional for low-risk patients, but is recommended for high-risk patients. This recommendation follows the consensus statement: Guidelines for Management of Incidental Pulmonary Nodules Detected on CT Images: From the Fleischner Society 2017; Radiology 2017; 284:228-243. 4. Aortic Atherosclerosis (ICD10-I70.0) and Emphysema (ICD10-J43.9). Electronically Signed   By: Kerby Moors M.D.   On: 07/11/2018 17:26   Mr Cervical Spine Wo Contrast  Addendum Date: 07/14/2018   ADDENDUM REPORT: 07/14/2018 12:39 ADDENDUM: Please note that impression #2 should read "foraminal impingement that is advanced on the left at C3-4 and C5-6." Electronically Signed   By: Monte Fantasia M.D.   On: 07/14/2018 12:39   Result Date: 07/14/2018 CLINICAL DATA:  Myasthenia gravis with exacerbation. Neck pain  when standing for 3-4 weeks. EXAM: MRI CERVICAL SPINE WITHOUT CONTRAST TECHNIQUE: Multiplanar, multisequence MR imaging of the cervical spine was performed. No intravenous contrast was administered. COMPARISON:  Neck CTA 06/14/2018 FINDINGS: Alignment: Borderline  anterolisthesis at C6-7 Vertebrae: Heterogeneous marrow without focal lesion. No discitis or fracture. Cord: Normal signal and morphology Posterior Fossa, vertebral arteries, paraspinal tissues: Negative Disc levels: C2-3: Facet spurring with probable ankylosis on the left. No herniation or impingement C3-4: Disc narrowing with asymmetric left endplate and uncovertebral ridging. Asymmetric left facet spurring with ligamentum flavum thickening. Advanced left foraminal stenosis. Mild left canal narrowing. Patent right foramen C4-5: Disc narrowing with uncovertebral ridging. Mild facet spurring greater on the left. Mild bilateral foraminal narrowing. Patent canal C5-6: Disc narrowing with uncovertebral ridging asymmetric to the left. Negative facets. Mild ligamentum flavum buckling. The canal is patent. Advanced left foraminal stenosis. Moderate appearing right foraminal narrowing that is more likely mild based on recent CTA. C6-7: Disc narrowing with shallow central protrusion. Disc narrowing causes mild bilateral foraminal narrowing. C7-T1:Mild facet spurring on the right. No herniation or impingement. IMPRESSION: 1. No acute finding. 2. Disc and facet degeneration causes foraminal impingement that is advanced on the left at L3-4 and on the left at C5-6. 3. Mild right foraminal narrowing at C5-6 and C6-7. Electronically Signed: By: Monte Fantasia M.D. On: 07/11/2018 15:07   Dg Swallowing Func-speech Pathology  Result Date: 07/18/2018 Objective Swallowing Evaluation: No data recorded Patient Details Name: [name] MRN: [id number] Date of Birth: 03-11-1941 Today's Date: 07/18/2018 Time: SLP Start Time (ACUTE ONLY): 1000 -SLP Stop Time (ACUTE ONLY): 1026 SLP Time Calculation (min) (ACUTE ONLY): 26 min Past Medical History: Past Medical History: Diagnosis Date . Colon polyp  . Diverticulosis   extensive 3'15. . Dysrhythmia   hx. Ablation for tachycardia, no routine cardiology visits now . ED (erectile  dysfunction)  . Heart murmur   teenager . History of colon polyps  . History of kidney stones   x1 passed . Hyperlipidemia  . Neuromuscular disorder ([place])   "neuroma"  . Osteoarthritis  . Palpitation   with svt rate 150 . Tendinitis   achiles heel spur . Testicular atrophy  . Tubular adenoma of colon  Past Surgical History: Past Surgical History: Procedure Laterality Date . ABLATION  2010  cardiac  . COLONOSCOPY   . EUS N/A 11/17/2014  Procedure: ESOPHAGEAL ENDOSCOPIC ULTRASOUND (EUS) RADIAL;  Surgeon: [name], MD;  Location: [facility];  Service: Endoscopy;  Laterality: N/A; . FINE NEEDLE ASPIRATION N/A 11/17/2014  Procedure: FINE NEEDLE ASPIRATION (FNA) LINEAR;  Surgeon: [name], MD;  Location: [facility];  Service: Endoscopy;  Laterality: N/A;  + or- fna . FLEXIBLE SIGMOIDOSCOPY   . HEMORRHOID SURGERY    banding  . HERNIA REPAIR Bilateral   '05 bilateral hernia . KNEE ARTHROSCOPY Left   scope '08 . KNEE ARTHROSCOPY   . LAPAROSCOPIC APPENDECTOMY N/A 09/05/2014  Procedure: APPENDECTOMY LAPAROSCOPIC;  Surgeon: [name], MD;  Location: [place];  Service: General;  Laterality: N/A; . ROTATOR CUFF REPAIR Right 2016 HPI: [name] is an 77 y.o. male with recently diagnosed seropositive MG presents to the ER after being sent by his Neurologist Dr. Narda Amber for progressive dysphagia. Patient has been progressively having difficulty with swallowing, however over the last 2 days has gotten much worse and has been barely able to swallow. Initially presented in August with left eyelid ptosis and subsequently diagnosed MG. 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 CT chest negative for thymoma. Currently on 88  mg Prednisone daily and Mestinon 30 mg TID.  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 Weakness in CN III, CN VII  and XII (left slightly greather than right) was evident in decrease eyelid retraction, decreased lingual and labial tenson and strength. There is anterior spillage and poor bolus formation and lingual propulsion with more passive tactics used for pharyngeal transit. Severe weakness observed in CN IX and X given almost absent innervation of musculature for bolus propulsion including the pharyngeal constrictors, base of tongue propulsion, hyoid excursion. Absent pharyngeal stripping and only minimal mobility of other pharyngeal musculature for laryngeal elevation and partial tilt of larynx and UES opening. Most of the bolus is penetrated before, during and after the swallow. Pts strength is adequate for adduction of arytenoids and laryngeal sensation allowing him to protect his airway to the best of his ability. Given signs of L>R CN VII weakness, AP view also utilized to determine if pharyngeal weakness was symmetrical, which it was. Though small sized boluses were attempted with thin, nectar and honey, swallow trigger did not improve and strength so profoundly impaired that no strategies or techniques were sufficient to allow safe, functional PO intake at this time. Recommend pt remain NPO with short term alternate means of nutrition as treatment hopefully targets CN function. Exercises are not appropriate until acute treatment complete as there is high risk of muscle/respiratory fatigue. Will f/u for conservative trials and signs of subjective improvement.  SLP Visit Diagnosis Dysphagia, oropharyngeal phase (R13.12) Attention and concentration deficit following -- Frontal lobe and executive function deficit following -- Impact on safety and function Severe aspiration risk;Risk for inadequate nutrition/hydration   No flowsheet data found.  No flowsheet data found. CHL IP DIET RECOMMENDATION 07/18/2018 SLP Diet Recommendations NPO;Alternative means - temporary Liquid Administration via -- Medication Administration --  Compensations -- Postural Changes --   No flowsheet data found.  No flowsheet data found.  No flowsheet data found.     CHL IP ORAL PHASE 07/18/2018 Oral Phase Impaired Oral - Pudding Teaspoon -- Oral - Pudding Cup -- Oral - Honey Teaspoon -- Oral - Honey Cup Weak lingual manipulation;Lingual pumping;Incomplete tongue to palate contact;Reduced posterior propulsion;Lingual/palatal residue;Delayed oral transit;Decreased bolus cohesion Oral - Nectar Teaspoon Weak lingual manipulation;Lingual pumping;Incomplete tongue to palate contact;Reduced posterior propulsion;Lingual/palatal residue;Delayed oral transit;Decreased bolus cohesion Oral - Nectar Cup -- Oral - Nectar Straw -- Oral - Thin Teaspoon Weak lingual manipulation;Lingual pumping;Incomplete tongue to palate contact;Reduced posterior propulsion;Lingual/palatal residue;Delayed oral transit;Decreased bolus cohesion Oral - Thin Cup -- Oral - Thin Straw -- Oral - Puree -- Oral - Mech Soft -- Oral - Regular -- Oral - Multi-Consistency -- Oral - Pill -- Oral Phase - Comment --  CHL IP PHARYNGEAL PHASE 07/18/2018 Pharyngeal Phase Impaired Pharyngeal- Pudding Teaspoon -- Pharyngeal -- Pharyngeal- Pudding Cup -- Pharyngeal -- Pharyngeal- Honey Teaspoon -- Pharyngeal -- Pharyngeal- Honey Cup Reduced pharyngeal peristalsis;Reduced epiglottic inversion;Reduced anterior laryngeal mobility;Reduced tongue base retraction;Delayed swallow initiation-pyriform sinuses;Penetration/Aspiration before swallow;Penetration/Aspiration during swallow;Penetration/Apiration after swallow;Pharyngeal residue - valleculae;Pharyngeal residue - pyriform;Moderate aspiration Pharyngeal Material enters airway, passes BELOW cords and not ejected out despite cough attempt by patient Pharyngeal- Nectar Teaspoon Reduced pharyngeal peristalsis;Reduced epiglottic inversion;Reduced anterior laryngeal mobility;Reduced tongue base retraction;Delayed swallow initiation-pyriform sinuses;Penetration/Aspiration  before swallow;Penetration/Aspiration during swallow;Penetration/Apiration after swallow;Pharyngeal residue - valleculae;Pharyngeal residue - pyriform;Trace aspiration Pharyngeal Material enters airway, passes BELOW cords and not  ejected out despite cough attempt by patient;Material enters airway, CONTACTS cords and then ejected out;Material does not enter airway Pharyngeal- Nectar Cup -- Pharyngeal -- Pharyngeal- Nectar Straw -- Pharyngeal -- Pharyngeal- Thin Teaspoon -- Pharyngeal -- Pharyngeal- Thin Cup -- Pharyngeal -- Pharyngeal- Thin Straw -- Pharyngeal -- Pharyngeal- Puree -- Pharyngeal -- Pharyngeal- Mechanical Soft -- Pharyngeal -- Pharyngeal- Regular -- Pharyngeal -- Pharyngeal- Multi-consistency -- Pharyngeal -- Pharyngeal- Pill -- Pharyngeal -- Pharyngeal Comment --  No flowsheet data found. DeBlois, Katherene Ponto 07/18/2018, 11:44 AM               Time Spent in minutes  30   Lala Lund M.D on 07/19/2018 at 9:54 AM  To page go to [email or web address] - password [email or web address]

## 2018-07-19 NOTE — Progress Notes (Signed)
IVIG ended at 2100, patient tolerated well.  P.J.Linus Mako, RN

## 2018-07-19 NOTE — Progress Notes (Signed)
NIF: -20 VC: 0.2L Patient is unable to get a tight seal around the device in order to get a true reading.

## 2018-07-19 NOTE — Progress Notes (Signed)
Patient resting comfortably at this time, no pulmonary mechanics performed.

## 2018-07-19 NOTE — Progress Notes (Signed)
Patient alert and oriented x 4, denies pain with assess.  Completed first dose of scheduled IVIG on 07/18/2018, patient verbalizes that he able to swallow, and feel "stronger".  Patient left eye droop partially resolved eye lid is more than half open.  Patient in stable condition at end of shift, will continue to monitor.

## 2018-07-19 NOTE — Progress Notes (Signed)
VC and NIF performed with patient.  VC 1.4L, NIF -35 with good patient effort.

## 2018-07-19 NOTE — Progress Notes (Addendum)
NEURO HOSPITALIST PROGRESS NOTE   Subjective: Patient awake, alert, in bed, NAD on RA. NGT in place. Patient received first dose IVIG States that he can swallow better. No concerns or issues o/n. NIF: -20  VC:0.2L    Exam: Vitals:   07/18/18 1741 07/18/18 1847  BP: 135/78 (!) 146/85  Pulse: 79 78  Resp:    Temp: 97.8 F (36.6 C) 97.6 F (36.4 C)  SpO2:      Physical Exam   HEENT-  Normocephalic, no lesions, without obvious abnormality.  Normal external eye and conjunctiva.   Cardiovascular- S1-S2 audible, pulses palpable throughout   Lungs-no rhonchi or wheezing noted, no excessive working breathing.  Saturations within normal limits on RA Abdomen- All 4 quadrants palpated and nontender Extremities- Warm, dry and intact Musculoskeletal-no joint tenderness, deformity or swelling Skin-warm and dry, no hyperpigmentation, vitiligo, or suspicious lesions   Neuro:  Mental Status: Alert, oriented, thought content appropriate.  Speech fluent without evidence of aphasia.  Able to follow commands without difficulty. Speech: dysarthric Cranial Nerves: Visual fields grossly normal,  ptosis Present in left eye so much that it is completely closed, extra-ocular motions intact bilaterally pupils equal, round, reactive to light and accommodation.  Bilateral facial weakness.  Motor: Right : Upper extremity   5/5    Left:     Upper extremity   5/5  Lower extremity   5/5     Lower extremity   5/5 Tone and bulk:normal tone throughout; no atrophy noted Sensory: light touch intact throughout, bilaterally Deep Tendon Reflexes: 2+ and symmetric biceps, patella Plantars: Right: downgoing   Left: downgoing Cerebellar: normal finger-to-nose, normal rapid alternating movements and normal heel-to-shin test Gait: deferred    Medications:  Scheduled: . aspirin  325 mg Oral Daily  . atorvastatin  10 mg Oral q1800  . doxazosin  8 mg Oral Daily  . enoxaparin (LOVENOX)  injection  40 mg Subcutaneous Q24H  . feeding supplement (PRO-STAT SUGAR FREE 64)  30 mL Per Tube BID  . finasteride  5 mg Oral Daily  . Immune Globulin 10%  400 mg/kg Intravenous Q24 Hr x 5  . predniSONE  40 mg Oral Q breakfast  . pyridostigmine  30 mg Oral 3 times per day  . vitamin B-12  1,000 mcg Oral Daily   Continuous: . feeding supplement (JEVITY 1.2 CAL) 1,000 mL (07/19/18 0200)   PRN:  Pertinent Labs/Diagnostics:  Respiratory:07/19/18 NIF: -20 ( improving) VC: 0.2L  Patient is unable to get a tight seal around the device in order to get a true reading.   Dg Swallowing Func-speech Pathology  Result Date: 07/18/2018 Objective Swallowing Evaluation: No data recorded Patient Details Name: [name] MRN: [id number] Date of Birth: 26-May-1941 Today's Date: 07/18/2018 Time: SLP Start Time (ACUTE ONLY): 1000 -SLP Stop Time (ACUTE ONLY): 1026 SLP Time Calculation (min) (ACUTE ONLY): 26 min Past Medical History: Past Medical History: Diagnosis Date . Colon polyp  . Diverticulosis   extensive 3'15. . Dysrhythmia   hx. Ablation for tachycardia, no routine cardiology visits now . ED (erectile dysfunction)  . Heart murmur   teenager . History of colon polyps  . History of kidney stones   x1 passed . Hyperlipidemia  . 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 Osteoarthritis  . Palpitation   with svt rate 150 . Tendinitis   achiles heel spur . Testicular  atrophy  . Tubular adenoma of colon  Past Surgical History: Past Surgical History: Procedure Laterality Date . ABLATION  2010  cardiac  . COLONOSCOPY   . EUS N/A 11/17/2014  Procedure: ESOPHAGEAL ENDOSCOPIC ULTRASOUND (EUS) RADIAL;  Surgeon: [name], MD;  Location: [facility];  Service: Endoscopy;  Laterality: N/A; . FINE NEEDLE ASPIRATION N/A 11/17/2014  Procedure: FINE NEEDLE ASPIRATION (FNA) LINEAR;  Surgeon: [name], MD;  Location: [facility];  Service: Endoscopy;  Laterality: N/A;  + or- fna . FLEXIBLE SIGMOIDOSCOPY   .  HEMORRHOID SURGERY    banding  . HERNIA REPAIR Bilateral   '05 bilateral hernia . KNEE ARTHROSCOPY Left   scope '08 . KNEE ARTHROSCOPY   . LAPAROSCOPIC APPENDECTOMY N/A 09/05/2014  Procedure: APPENDECTOMY LAPAROSCOPIC;  Surgeon: [name], MD;  Location: [facility];  Service: General;  Laterality: N/A; . ROTATOR CUFF REPAIR Right 2016 HPI: [name] is an 77 y.o. male with recently diagnosed seropositive MG presents to the ER after being sent by his Neurologist Dr. Narda Amber for progressive dysphagia. Patient has been progressively having difficulty with swallowing, however over the last 2 days has gotten much worse and has been barely able to swallow. Initially presented in August with left eyelid ptosis and subsequently diagnosed MG. 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 CT chest negative for thymoma. Currently on 40 mg Prednisone daily and Mestinon 30 mg TID.  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 Weakness in CN III, CN VII and XII (left slightly greather than right) was evident in decrease eyelid retraction, decreased lingual and labial tenson and strength. There is anterior spillage and poor bolus formation and lingual propulsion with more passive tactics used for pharyngeal transit. Severe weakness observed in CN IX and X given almost absent innervation of musculature for bolus propulsion including the pharyngeal constrictors, base of tongue propulsion, hyoid excursion. Absent pharyngeal stripping and only minimal mobility of other pharyngeal musculature for laryngeal elevation and partial tilt of larynx and UES opening. Most of the bolus is penetrated before, during and after the swallow. Pts strength is adequate for adduction of arytenoids and laryngeal sensation allowing him to protect his airway to the best of his  ability. Given signs of L>R CN VII weakness, AP view also utilized to determine if pharyngeal weakness was symmetrical, which it was. Though small sized boluses were attempted with thin, nectar and honey, swallow trigger did not improve and strength so profoundly impaired that no strategies or techniques were sufficient to allow safe, functional PO intake at this time. Recommend pt remain NPO with short term alternate means of nutrition as treatment hopefully targets CN function. Exercises are not appropriate until acute treatment complete as there is high risk of muscle/respiratory fatigue. Will f/u for conservative trials and signs of subjective improvement.  SLP Visit Diagnosis Dysphagia, oropharyngeal phase (R13.12) Attention and concentration deficit following -- Frontal lobe and executive function deficit following -- Impact on safety and function Severe aspiration risk;Risk for inadequate nutrition/hydration   No flowsheet data found.  No flowsheet data found. CHL IP DIET RECOMMENDATION 07/18/2018 SLP Diet Recommendations NPO;Alternative means - temporary Liquid Administration via -- Medication Administration -- Compensations -- Postural Changes --   No flowsheet data found.  No flowsheet data found.  No flowsheet data found.     CHL IP ORAL PHASE 07/18/2018 Oral Phase Impaired Oral - Pudding  Teaspoon -- Oral - Pudding Cup -- Oral - Honey Teaspoon -- Oral - Honey Cup Weak lingual manipulation;Lingual pumping;Incomplete tongue to palate contact;Reduced posterior propulsion;Lingual/palatal residue;Delayed oral transit;Decreased bolus cohesion Oral - Nectar Teaspoon Weak lingual manipulation;Lingual pumping;Incomplete tongue to palate contact;Reduced posterior propulsion;Lingual/palatal residue;Delayed oral transit;Decreased bolus cohesion Oral - Nectar Cup -- Oral - Nectar Straw -- Oral - Thin Teaspoon Weak lingual manipulation;Lingual pumping;Incomplete tongue to palate contact;Reduced posterior  propulsion;Lingual/palatal residue;Delayed oral transit;Decreased bolus cohesion Oral - Thin Cup -- Oral - Thin Straw -- Oral - Puree -- Oral - Mech Soft -- Oral - Regular -- Oral - Multi-Consistency -- Oral - Pill -- Oral Phase - Comment --  CHL IP PHARYNGEAL PHASE 07/18/2018 Pharyngeal Phase Impaired Pharyngeal- Pudding Teaspoon -- Pharyngeal -- Pharyngeal- Pudding Cup -- Pharyngeal -- Pharyngeal- Honey Teaspoon -- Pharyngeal -- Pharyngeal- Honey Cup Reduced pharyngeal peristalsis;Reduced epiglottic inversion;Reduced anterior laryngeal mobility;Reduced tongue base retraction;Delayed swallow initiation-pyriform sinuses;Penetration/Aspiration before swallow;Penetration/Aspiration during swallow;Penetration/Apiration after swallow;Pharyngeal residue - valleculae;Pharyngeal residue - pyriform;Moderate aspiration Pharyngeal Material enters airway, passes BELOW cords and not ejected out despite cough attempt by patient Pharyngeal- Nectar Teaspoon Reduced pharyngeal peristalsis;Reduced epiglottic inversion;Reduced anterior laryngeal mobility;Reduced tongue base retraction;Delayed swallow initiation-pyriform sinuses;Penetration/Aspiration before swallow;Penetration/Aspiration during swallow;Penetration/Apiration after swallow;Pharyngeal residue - valleculae;Pharyngeal residue - pyriform;Trace aspiration Pharyngeal Material enters airway, passes BELOW cords and not ejected out despite cough attempt by patient;Material enters airway, CONTACTS cords and then ejected out;Material does not enter airway Pharyngeal- Nectar Cup -- Pharyngeal -- Pharyngeal- Nectar Straw -- Pharyngeal -- Pharyngeal- Thin Teaspoon -- Pharyngeal -- Pharyngeal- Thin Cup -- Pharyngeal -- Pharyngeal- Thin Straw -- Pharyngeal -- Pharyngeal- Puree -- Pharyngeal -- Pharyngeal- Mechanical Soft -- Pharyngeal -- Pharyngeal- Regular -- Pharyngeal -- Pharyngeal- Multi-consistency -- Pharyngeal -- Pharyngeal- Pill -- Pharyngeal -- Pharyngeal Comment --  No  flowsheet data found. DeBlois, Katherene Ponto 07/18/2018, 11:44 AM              Assessment:  77 y.o. male with recently diagnosed seropositive MG presents ptosis , facial weakness and worsening dysphagia consistent with myasthenia gravis exacerbation.  Does not  appear to have respiratory compromise, however has some neck flexion weakness and would monitor closely.  IVIG administered yesterday for unclear reason.    Recommendations:  - continue IVIG 0.4g/kg x 5 days  ( 2nd dose today) last dose due 07/23/18 - NIF and FVC q8h, caution during interpretation due to significant oraoharyngeal weakness - Continue home steroids - Check for underlying infection - Regular neuro checks -Neurology will continue to follow  Laurey Morale, MSN, NP-C [facility] [phone]   07/19/2018, 8:41 AM   NEUROHOSPITALIST ADDENDUM Performed a face to face diagnostic evaluation.   I have reviewed the contents of history and physical exam as documented by PA/ARNP/Resident and agree with above documentation.  I have discussed and formulated the above plan as documented. Edits to the note have been made as needed.  Impression: Patient with myasthenia gravis received first dose of IVIG yesterday.  Clinically he appears better and speech is less dysarthric compared to yesterday.  Plan: Continue 4 more days of IVIG.    Karena Addison Camrynn Mcclintic MD [facility] [phone]   If 7pm to 7am, please call on call as listed on AMION.

## 2018-07-20 LAB — PHOSPHORUS: Phosphorus: 3.5 mg/dL (ref 2.5–4.6)

## 2018-07-20 LAB — CBC
HCT: 45.1 % (ref 39.0–52.0)
Hemoglobin: 14.5 g/dL (ref 13.0–17.0)
MCH: 28.9 pg (ref 26.0–34.0)
MCHC: 32.2 g/dL (ref 30.0–36.0)
MCV: 89.8 fL (ref 78.0–100.0)
Platelets: 240 10*3/uL (ref 150–400)
RBC: 5.02 MIL/uL (ref 4.22–5.81)
RDW: 13.4 % (ref 11.5–15.5)
WBC: 8.3 10*3/uL (ref 4.0–10.5)

## 2018-07-20 LAB — COMPREHENSIVE METABOLIC PANEL
ALT: 16 U/L (ref 0–44)
AST: 16 U/L (ref 15–41)
Albumin: 3.1 g/dL — ABNORMAL LOW (ref 3.5–5.0)
Alkaline Phosphatase: 60 U/L (ref 38–126)
Anion gap: 12 (ref 5–15)
BUN: 17 mg/dL (ref 8–23)
CO2: 28 mmol/L (ref 22–32)
Calcium: 9 mg/dL (ref 8.9–10.3)
Chloride: 103 mmol/L (ref 98–111)
Creatinine, Ser: 0.77 mg/dL (ref 0.61–1.24)
GFR calc Af Amer: >60 mL/min (ref >60)
GFR calc non Af Amer: >60 mL/min (ref >60)
Glucose, Bld: 101 mg/dL — ABNORMAL HIGH (ref 70–99)
Potassium: 3.3 mmol/L — ABNORMAL LOW (ref 3.5–5.1)
Sodium: 143 mmol/L (ref 135–145)
Total Bilirubin: 0.9 mg/dL (ref 0.3–1.2)
Total Protein: 6.8 g/dL (ref 6.5–8.1)

## 2018-07-20 LAB — MAGNESIUM: Magnesium: 2.3 mg/dL (ref 1.7–2.4)

## 2018-07-20 MED ORDER — POTASSIUM CHLORIDE 10 MEQ/100ML IV SOLN
10.0000 meq | INTRAVENOUS | Status: AC
  Administered 2018-07-20 (×2): 10 meq via INTRAVENOUS
  Filled 2018-07-20 (×2): qty 100

## 2018-07-20 NOTE — Progress Notes (Signed)
Patient used good effort to get NIF -20 and FVC 1.7L.

## 2018-07-20 NOTE — Evaluation (Signed)
Physical Therapy Evaluation Patient Details Name: [name] MRN: [id number] DOB: 30-Aug-1941 Today's Date: 07/20/2018   History of Present Illness  Pt adm with myasthenia gravis exacerbation and inability to swallow. Receiving IVIG treatments. PMH - copd, polyneuropathy, htn, rotator cuff  Clinical Impression  Pt doing well with mobility and no further PT needed.  Recommended to pt he amb in halls 3x/day. Wife or staff can assist with IV pole if needed.      Follow Up Recommendations No PT follow up    Equipment Recommendations  None recommended by PT    Recommendations for Other Services       Precautions / Restrictions Precautions Precautions: None      Mobility  Bed Mobility Overal bed mobility: Independent                Transfers Overall transfer level: Independent Equipment used: None                Ambulation/Gait Ambulation/Gait assistance: Independent Gait Distance (Feet): 500 Feet Assistive device: None Gait Pattern/deviations: WFL(Within Functional Limits)   Gait velocity interpretation: >4.37 ft/sec, indicative of normal walking speed General Gait Details: Steady gait  Stairs            Wheelchair Mobility    Modified Rankin (Stroke Patients Only)       Balance Overall balance assessment: No apparent balance deficits (not formally assessed)                                           Pertinent Vitals/Pain Pain Assessment: No/denies pain    Home Living Family/patient expects to be discharged to:: Private residence Living Arrangements: Spouse/significant other;Children Available Help at Discharge: Family Type of Home: House Home Access: Stairs to enter Entrance Stairs-Rails: Right Entrance Stairs-Number of Steps: 13 Home Layout: Multi-level Home Equipment: Crutches;Cane - single point      Prior Function Level of Independence: Independent         Comments: 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     [employer]        Extremity/Trunk Assessment   Upper Extremity Assessment Upper Extremity Assessment: Overall WFL for tasks assessed    Lower Extremity Assessment Lower Extremity Assessment: RLE deficits/detail;LLE deficits/detail;Overall WFL for tasks assessed RLE Sensation: history of peripheral neuropathy LLE Sensation: history of peripheral neuropathy       Communication   Communication: Expressive difficulties(dysarthria)  Cognition Arousal/Alertness: Awake/alert Behavior During Therapy: WFL for tasks assessed/performed Overall Cognitive Status: Within Functional Limits for tasks assessed                                        General Comments      Exercises     Assessment/Plan    PT Assessment Patent does not need any further PT services  PT Problem List         PT Treatment Interventions      PT Goals (Current goals can be found in the Care Plan section)  Acute Rehab PT Goals PT Goal Formulation: All assessment and education complete, DC therapy    Frequency     Barriers to discharge        Co-evaluation  AM-PAC PT "6 Clicks" Daily Activity  Outcome Measure Difficulty turning over in bed (including adjusting bedclothes, sheets and blankets)?: None Difficulty moving from lying on back to sitting on the side of the bed? : None Difficulty sitting down on and standing up from a chair with arms (e.g., wheelchair, bedside commode, etc,.)?: None Help needed moving to and from a bed to chair (including a wheelchair)?: None Help needed walking in hospital room?: None Help needed climbing 3-5 steps with a railing? : None 6 Click Score: 24    End of Session   Activity Tolerance: Patient tolerated treatment well Patient left: in chair;with call bell/phone within reach   PT Visit Diagnosis: Other symptoms and signs involving the nervous system (R29.898)    Time: 1740-8144 PT Time Calculation  (min) (ACUTE ONLY): 12 min   Charges:   PT Evaluation $PT Eval Low Complexity: [place] Pager [phone] [place] 07/20/2018, 10:45 AM

## 2018-07-20 NOTE — Progress Notes (Signed)
Patient tolerated IVIG infusion without difficulty.  Infusion complete.  VSS.  P.J. Linus Mako, RN

## 2018-07-20 NOTE — Progress Notes (Addendum)
@IPLOG @        PROGRESS NOTE                                                                                                                                                                                                             Patient Demographics:    [name], is a 77 y.o. male, DOB - 1941-08-19, MCN:[id number]  Admit date - 07/17/2018   Admitting Physician Kinnie Feil, MD  Outpatient Primary MD for the patient is Hulan Fess, MD  LOS - 3  Chief Complaint  Patient presents with  . Myasthenia Gravis       Brief Narrative  [name] is a 77 y.o. male with PMH of BPH, HPL, recent diagnosis of myasthenia (05/2018) started on prednisone, mestinon presented with dysphasia. Patient states that he has been experiencing progressive difficulty with swallowing for two days. He reports worsening his right eye ptosis with diplopia along with difficulty in swallowing, he was diagnosed with myasthenia crisis and admitted to the hospital after neurology was consulted.   Subjective:   Patient in bed, appears comfortable, denies any headache, no fever, no chest pain or pressure, no shortness of breath , no abdominal pain. No focal weakness.  Overall feels better.   Assessment  & Plan :     1.  Myasthenia gravis exacerbation.  Neuro on board, continue IVIG along with pyridostigmine and home dose prednisone, last dose of IVIG on 07/23/2018, continue continuous pulse ox monitoring, continue monitoring NIF 3 times a day, SNF on 07/20/2018 is around -20, due to oropharyngeal weakness could be misleading.  He is currently in no distress in terms of shortness of breath.  Speech following and sees his swallow reflex is very poor hence NG tube was placed and tube feeding was started under dietary consultation on 07/18/2018, thymoma has been ruled out.  2.  BPH.  Continue alpha-blocker.  3.  Aphasia.  Seen by speech likely due to #1 above, feeding tube for now.  4.  Dyslipidemia.  On  statin.  5.  Hypertension.  Lopressor twice daily added with NG tube.  We will continue to monitor.  6.  Hypokalemia.  Replaced.   Family Communication  :  None  Code Status :  Full  Disposition Plan  :  Med  Consults  :  Neuro  Procedures  :    DVT Prophylaxis  :  Lovenox    Lab Results  Component Value Date   PLT 240 07/20/2018    Diet :  Diet Order            Diet NPO time specified  Diet effective now               Inpatient Medications Scheduled Meds: . aspirin  325 mg Oral Daily  . atorvastatin  10 mg Oral q1800  . doxazosin  8 mg Oral Daily  . enoxaparin (LOVENOX) injection  40 mg Subcutaneous Q24H  . feeding supplement (PRO-STAT SUGAR FREE 64)  30 mL Per Tube BID  . finasteride  5 mg Oral Daily  . Immune Globulin 10%  400 mg/kg Intravenous Q24 Hr x 5  . metoprolol tartrate  50 mg Per Tube BID  . predniSONE  40 mg Oral Q breakfast  . pyridostigmine  30 mg Oral 3 times per day  . vitamin B-12  1,000 mcg Oral Daily   Continuous Infusions: . feeding supplement (JEVITY 1.2 CAL) 1,000 mL (07/20/18 0145)  . potassium chloride     PRN Meds:.  Antibiotics  :   Anti-infectives (From admission, onward)   None          Objective:   Vitals:   07/19/18 1736 07/19/18 2133 07/20/18 0509 07/20/18 0913  BP: 124/76 136/84 122/62 (!) 137/91  Pulse: 74 67 70 79  Resp:  18    Temp: 97.9 F (36.6 C) 97.9 F (36.6 C) 97.6 F (36.4 C)   TempSrc: Oral Oral Oral   SpO2:  95% 94%   Weight:      Height:        Wt Readings from Last 3 Encounters:  07/17/18 88.1 kg  07/02/18 92.3 kg  06/15/18 93.9 kg     Intake/Output Summary (Last 24 hours) at 07/20/2018 0942 Last data filed at 07/20/2018 0402 Gross per 24 hour  Intake 1934.5 ml  Output 50 ml  Net 1884.5 ml     Physical Exam  Awake Alert, Oriented X 3, No new F.N deficits, Normal affect [place].AT,PERRAL, improving left eye ptosis Supple Neck,No JVD, No cervical lymphadenopathy appriciated.   Symmetrical Chest wall movement, Good air movement bilaterally, CTAB RRR,No Gallops, Rubs or new Murmurs, No Parasternal Heave +ve B.Sounds, Abd Soft, No tenderness, No organomegaly appriciated, No rebound - guarding or rigidity. No Cyanosis, Clubbing or edema, No new Rash or bruise    Data Review:    CBC Recent Labs  Lab 07/17/18 1218 07/19/18 0452 07/20/18 0549  WBC 10.4 7.8 8.3  HGB 14.9 14.6 14.5  HCT 46.6 45.3 45.1  PLT 230 233 240  MCV 90.8 89.3 89.8  MCH 29.0 28.8 28.9  MCHC 32.0 32.2 32.2  RDW 13.2 13.2 13.4  LYMPHSABS 1.8  --   --   MONOABS 0.7  --   --   EOSABS 0.1  --   --   BASOSABS 0.0  --   --     Chemistries  Recent Labs  Lab 07/17/18 1218 07/19/18 0452 07/20/18 0549  NA 144 141 143  K 3.5 3.7 3.3*  CL 104 104 103  CO2 30 29 28   GLUCOSE 101* 120* 101*  BUN 13 14 17   CREATININE 0.90 0.81 0.77  CALCIUM 8.8* 8.9 9.0  MG  --  2.4 2.3  AST 18  --  16  ALT 21  --  16  ALKPHOS 62  --  60  BILITOT 1.0  --  0.9   ------------------------------------------------------------------------------------------------------------------ No results for input(s): CHOL, HDL, LDLCALC, TRIG, CHOLHDL, LDLDIRECT in the last 72 hours.  Lab Results  Component Value Date   HGBA1C 5.3 06/14/2018   ------------------------------------------------------------------------------------------------------------------ No results for input(s): TSH, T4TOTAL, T3FREE, THYROIDAB in the last 72 hours.  Invalid input(s): FREET3 ------------------------------------------------------------------------------------------------------------------ No results for input(s): VITAMINB12, FOLATE, FERRITIN, TIBC, IRON, RETICCTPCT in the last 72 hours.  Coagulation profile No results for input(s): INR, PROTIME in the last 168 hours.  No results for input(s): DDIMER in the last 72 hours.  Cardiac Enzymes No results for input(s): CKMB, TROPONINI, MYOGLOBIN in the last 168 hours.  Invalid  input(s): CK ------------------------------------------------------------------------------------------------------------------ No results found for: BNP  Micro Results Recent Results (from the past 240 hour(s))  MRSA PCR Screening     Status: None   Collection Time: 07/18/18  4:13 AM  Result Value Ref Range Status   MRSA by PCR NEGATIVE NEGATIVE Final    Comment:        The GeneXpert MRSA Assay (FDA approved for NASAL specimens only), is one component of a comprehensive MRSA colonization surveillance program. It is not intended to diagnose MRSA infection nor to guide or monitor treatment for MRSA infections. Performed at [facility], [place] [address] [place], [place] [address]     Radiology Reports Ct Chest W Contrast  Result Date: 07/11/2018 CLINICAL DATA:  Back pain.  Evaluate thymus gland. EXAM: CT CHEST WITH CONTRAST TECHNIQUE: Multidetector CT imaging of the chest was performed during intravenous contrast administration. CONTRAST:  29mL ISOVUE-300 IOPAMIDOL (ISOVUE-300) INJECTION 61% COMPARISON:  10/27/2014 FINDINGS: Cardiovascular: The heart size appears within normal limits. No pericardial effusion. Aortic atherosclerosis. Calcification in the RCA noted. Mediastinum/Nodes: Normal appearance of the thyroid gland. The trachea appears patent and is midline. Normal appearance of the esophagus. No residual or recurrent thymus tissue identified. No mediastinal mass identified. No mediastinal or hilar adenopathy. Lungs/Pleura: Mild to moderate changes of emphysema identified. No pleural effusion noted. Faint ground-glass attenuating nodule within the right upper lobe measures 8 mm and is stable from 10/27/2014. Chronic appearing scar like densities noted within the lower lobes bilaterally. Unchanged from previous exam. New subpleural nodule within the left upper lobe is nonspecific measuring 6 mm, image 41/5. Upper Abdomen: Scattered simple appearing liver cysts are again identified  involving both lobes. The gallbladder appears within normal limits. No biliary ductal dilatation. Indeterminate intermediate attenuating structure within the left upper quadrant of the abdomen measures 3.2 cm by 2.1 cm. On the previous exam this measured 3.5 by 2.3 cm. This is compatible with a benign abnormality. Musculoskeletal: Spondylosis identified within the thoracic spine. No aggressive lytic or sclerotic bone lesions. IMPRESSION: 1. No evidence for residual or recurrent thymus tissue or thymic neoplasm. 2. Unchanged 8 mm ground-glass attenuating nodule within the right upper lobe measuring 8 mm. Repeat CT is recommended every 2 years until 5 years of stability has been established. Five years of documented stability would be on 10/28/2019. This recommendation follows the consensus statement: Guidelines for Management of Incidental Pulmonary Nodules Detected on CT Images: From the Fleischner Society 2017; Radiology 2017; 284:228-243. 3. New solid nodule in the left upper lobe measures 6 mm. Non-contrast chest CT at 6-12 months is recommended. If the nodule is stable at time of repeat CT, then future CT at 18-24 months (from today's scan) is considered optional for low-risk patients, but is recommended for high-risk patients. This recommendation follows the consensus statement: Guidelines for Management of Incidental Pulmonary Nodules Detected on CT Images: From the Fleischner Society 2017; Radiology 2017; 284:228-243. 4. Aortic Atherosclerosis (ICD10-I70.0) and Emphysema (ICD10-J43.9). Electronically Signed  By: Kerby Moors M.D.   On: 07/11/2018 17:26   Mr Cervical Spine Wo Contrast  Addendum Date: 07/14/2018   ADDENDUM REPORT: 07/14/2018 12:39 ADDENDUM: Please note that impression #2 should read "foraminal impingement that is advanced on the left at C3-4 and C5-6." Electronically Signed   By: Monte Fantasia M.D.   On: 07/14/2018 12:39   Result Date: 07/14/2018 CLINICAL DATA:  Myasthenia gravis  with exacerbation. Neck pain when standing for 3-4 weeks. EXAM: MRI CERVICAL SPINE WITHOUT CONTRAST TECHNIQUE: Multiplanar, multisequence MR imaging of the cervical spine was performed. No intravenous contrast was administered. COMPARISON:  Neck CTA 06/14/2018 FINDINGS: Alignment: Borderline anterolisthesis at C6-7 Vertebrae: Heterogeneous marrow without focal lesion. No discitis or fracture. Cord: Normal signal and morphology Posterior Fossa, vertebral arteries, paraspinal tissues: Negative Disc levels: C2-3: Facet spurring with probable ankylosis on the left. No herniation or impingement C3-4: Disc narrowing with asymmetric left endplate and uncovertebral ridging. Asymmetric left facet spurring with ligamentum flavum thickening. Advanced left foraminal stenosis. Mild left canal narrowing. Patent right foramen C4-5: Disc narrowing with uncovertebral ridging. Mild facet spurring greater on the left. Mild bilateral foraminal narrowing. Patent canal C5-6: Disc narrowing with uncovertebral ridging asymmetric to the left. Negative facets. Mild ligamentum flavum buckling. The canal is patent. Advanced left foraminal stenosis. Moderate appearing right foraminal narrowing that is more likely mild based on recent CTA. C6-7: Disc narrowing with shallow central protrusion. Disc narrowing causes mild bilateral foraminal narrowing. C7-T1:Mild facet spurring on the right. No herniation or impingement. IMPRESSION: 1. No acute finding. 2. Disc and facet degeneration causes foraminal impingement that is advanced on the left at L3-4 and on the left at C5-6. 3. Mild right foraminal narrowing at C5-6 and C6-7. Electronically Signed: By: Monte Fantasia M.D. On: 07/11/2018 15:07   Dg Swallowing Func-speech Pathology  Result Date: 07/18/2018 Objective Swallowing Evaluation: No data recorded Patient Details Name: Ebon Ketchum MRN: [id number] Date of Birth: October 17, 1941 Today's Date: 07/18/2018 Time: SLP Start Time (ACUTE ONLY): 1000  -SLP Stop Time (ACUTE ONLY): 1026 SLP Time Calculation (min) (ACUTE ONLY): 26 min Past Medical History: Past Medical History: Diagnosis Date . Colon polyp  . Diverticulosis   extensive 3'15. . Dysrhythmia   hx. Ablation for tachycardia, no routine cardiology visits now . ED (erectile dysfunction)  . Heart murmur   teenager . History of colon polyps  . History of kidney stones   x1 passed . Hyperlipidemia  . Neuromuscular disorder ([place])   "neuroma"  . Osteoarthritis  . Palpitation   with svt rate 150 . Tendinitis   achiles heel spur . Testicular atrophy  . Tubular adenoma of colon  Past Surgical History: Past Surgical History: Procedure Laterality Date . ABLATION  2010  cardiac  . COLONOSCOPY   . EUS N/A 11/17/2014  Procedure: ESOPHAGEAL ENDOSCOPIC ULTRASOUND (EUS) RADIAL;  Surgeon: [name], MD;  Location: [facility];  Service: Endoscopy;  Laterality: N/A; . FINE NEEDLE ASPIRATION N/A 11/17/2014  Procedure: FINE NEEDLE ASPIRATION (FNA) LINEAR;  Surgeon: [name], MD;  Location: [facility];  Service: Endoscopy;  Laterality: N/A;  + or- fna . FLEXIBLE SIGMOIDOSCOPY   . HEMORRHOID SURGERY    banding  . HERNIA REPAIR Bilateral   '05 bilateral hernia . KNEE ARTHROSCOPY Left   scope '08 . KNEE ARTHROSCOPY   . LAPAROSCOPIC APPENDECTOMY N/A 09/05/2014  Procedure: APPENDECTOMY LAPAROSCOPIC;  Surgeon: [name], MD;  Location: [facility];  Service: General;  Laterality: N/A; . ROTATOR CUFF REPAIR Right 2016 HPI: Douglas [place]  [name] is an 77 y.o. male with recently diagnosed seropositive MG presents to the ER after being sent by his Neurologist Dr. Narda Amber for progressive dysphagia. Patient has been progressively having difficulty with swallowing, however over the last 2 days has gotten much worse and has been barely able to swallow. Initially presented in August with left eyelid ptosis and subsequently diagnosed MG. 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 CT chest negative for thymoma. Currently on 40 mg Prednisone daily and  Mestinon 30 mg TID.  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 Weakness in CN III, CN VII and XII (left slightly greather than right) was evident in decrease eyelid retraction, decreased lingual and labial tenson and strength. There is anterior spillage and poor bolus formation and lingual propulsion with more passive tactics used for pharyngeal transit. Severe weakness observed in CN IX and X given almost absent innervation of musculature for bolus propulsion including the pharyngeal constrictors, base of tongue propulsion, hyoid excursion. Absent pharyngeal stripping and only minimal mobility of other pharyngeal musculature for laryngeal elevation and partial tilt of larynx and UES opening. Most of the bolus is penetrated before, during and after the swallow. Pts strength is adequate for adduction of arytenoids and laryngeal sensation allowing him to protect his airway to the best of his ability. Given signs of L>R CN VII weakness, AP view also utilized to determine if pharyngeal weakness was symmetrical, which it was. Though small sized boluses were attempted with thin, nectar and honey, swallow trigger did not improve and strength so profoundly impaired that no strategies or techniques were sufficient to allow safe, functional PO intake at this time. Recommend pt remain NPO with short term alternate means of nutrition as treatment hopefully targets CN function. Exercises are not appropriate until acute treatment complete as there is high risk of muscle/respiratory fatigue. Will f/u for conservative trials and signs of subjective improvement.  SLP Visit Diagnosis Dysphagia, oropharyngeal phase (R13.12) Attention and concentration deficit following -- Frontal lobe and executive function deficit following -- Impact on  safety and function Severe aspiration risk;Risk for inadequate nutrition/hydration   No flowsheet data found.  No flowsheet data found. CHL IP DIET RECOMMENDATION 07/18/2018 SLP Diet Recommendations NPO;Alternative means - temporary Liquid Administration via -- Medication Administration -- Compensations -- Postural Changes --   No flowsheet data found.  No flowsheet data found.  No flowsheet data found.     CHL IP ORAL PHASE 07/18/2018 Oral Phase Impaired Oral - Pudding Teaspoon -- Oral - Pudding Cup -- Oral - Honey Teaspoon -- Oral - Honey Cup Weak lingual manipulation;Lingual pumping;Incomplete tongue to palate contact;Reduced posterior propulsion;Lingual/palatal residue;Delayed oral transit;Decreased bolus cohesion Oral - Nectar Teaspoon Weak lingual manipulation;Lingual pumping;Incomplete tongue to palate contact;Reduced posterior propulsion;Lingual/palatal residue;Delayed oral transit;Decreased bolus cohesion Oral - Nectar Cup -- Oral - Nectar Straw -- Oral - Thin Teaspoon Weak lingual manipulation;Lingual pumping;Incomplete tongue to palate contact;Reduced posterior propulsion;Lingual/palatal residue;Delayed oral transit;Decreased bolus cohesion Oral - Thin Cup -- Oral - Thin Straw -- Oral - Puree -- Oral - Mech Soft -- Oral - Regular -- Oral - Multi-Consistency -- Oral - Pill -- Oral Phase - Comment --  CHL IP PHARYNGEAL PHASE 07/18/2018 Pharyngeal Phase Impaired Pharyngeal- Pudding Teaspoon -- Pharyngeal -- Pharyngeal- Pudding Cup -- Pharyngeal -- Pharyngeal- Honey Teaspoon -- Pharyngeal -- Pharyngeal- Honey Cup Reduced pharyngeal peristalsis;Reduced epiglottic inversion;Reduced anterior laryngeal mobility;Reduced  tongue base retraction;Delayed swallow initiation-pyriform sinuses;Penetration/Aspiration before swallow;Penetration/Aspiration during swallow;Penetration/Apiration after swallow;Pharyngeal residue - valleculae;Pharyngeal residue - pyriform;Moderate aspiration Pharyngeal Material enters airway, passes  BELOW cords and not ejected out despite cough attempt by patient Pharyngeal- Nectar Teaspoon Reduced pharyngeal peristalsis;Reduced epiglottic inversion;Reduced anterior laryngeal mobility;Reduced tongue base retraction;Delayed swallow initiation-pyriform sinuses;Penetration/Aspiration before swallow;Penetration/Aspiration during swallow;Penetration/Apiration after swallow;Pharyngeal residue - valleculae;Pharyngeal residue - pyriform;Trace aspiration Pharyngeal Material enters airway, passes BELOW cords and not ejected out despite cough attempt by patient;Material enters airway, CONTACTS cords and then ejected out;Material does not enter airway Pharyngeal- Nectar Cup -- Pharyngeal -- Pharyngeal- Nectar Straw -- Pharyngeal -- Pharyngeal- Thin Teaspoon -- Pharyngeal -- Pharyngeal- Thin Cup -- Pharyngeal -- Pharyngeal- Thin Straw -- Pharyngeal -- Pharyngeal- Puree -- Pharyngeal -- Pharyngeal- Mechanical Soft -- Pharyngeal -- Pharyngeal- Regular -- Pharyngeal -- Pharyngeal- Multi-consistency -- Pharyngeal -- Pharyngeal- Pill -- Pharyngeal -- Pharyngeal Comment --  No flowsheet data found. DeBlo[place], Katherene Ponto 07/18/2018, 11:44 AM               Time Spent in minutes  30   Lala Lund M.D on 07/20/2018 at 9:42 AM  To page go to [email or web address] - password [email or web address]

## 2018-07-20 NOTE — Progress Notes (Signed)
VC 1.3L NIF -20 Good patient effort.

## 2018-07-20 NOTE — Progress Notes (Addendum)
NEURO HOSPITALIST PROGRESS NOTE   Subjective: Patient in bed, eyes closed, sleeping . Patient reports that he is swallowing better, handling his secretions better and having to suction himself less. Able to control his facial muscles/ lips better. No concerns or issues o/n. NIF: -35  VC:1.4L  With good effort. They are improving.   Exam: Vitals:   07/19/18 2133 07/20/18 0509  BP: 136/84 122/62  Pulse: 67 70  Resp: 18   Temp: 97.9 F (36.6 C) 97.6 F (36.4 C)  SpO2: 95% 94%    Physical Exam   HEENT-  Normocephalic, no lesions, without obvious abnormality.  Normal external eye and conjunctiva.   Cardiovascular- S1-S2 audible, pulses palpable throughout   Lungs-no rhonchi or wheezing noted, no excessive working breathing.  Saturations within normal limits on RA Abdomen- All 4 quadrants palpated and nontender Extremities- Warm, dry and intact Musculoskeletal-no joint tenderness, deformity or swelling Skin-warm and dry, no hyperpigmentation, vitiligo, or suspicious lesions   Neuro:  Mental Status: Alert, oriented, thought content appropriate.  Speech fluent without evidence of aphasia.  Mildly dysarthric able to follow commands without difficulty. Cranial Nerves: Visual fields grossly normal,  ptosis Present in left eye. Left eye opened , but ptosis still present. , extra-ocular motions intact bilaterally pupils equal, round, reactive to light and accommodation.  Wear bilateral hearing aides at baseline. Bilateral facial weakness.  Motor: Right : Upper extremity   5/5    Left:     Upper extremity   5/5  Lower extremity   5/5     Lower extremity   5/5 Tone and bulk:normal tone throughout; no atrophy noted Sensory: light touch intact throughout, bilaterally Deep Tendon Reflexes: 2+ and symmetric biceps, patella Plantars: Right: downgoing   Left: downgoing Cerebellar: normal finger-to-nose, normal rapid alternating movements and normal heel-to-shin  test Gait: deferred    Medications:  Scheduled: . aspirin  325 mg Oral Daily  . atorvastatin  10 mg Oral q1800  . doxazosin  8 mg Oral Daily  . enoxaparin (LOVENOX) injection  40 mg Subcutaneous Q24H  . feeding supplement (PRO-STAT SUGAR FREE 64)  30 mL Per Tube BID  . finasteride  5 mg Oral Daily  . Immune Globulin 10%  400 mg/kg Intravenous Q24 Hr x 5  . metoprolol tartrate  50 mg Per Tube BID  . predniSONE  40 mg Oral Q breakfast  . pyridostigmine  30 mg Oral 3 times per day  . vitamin B-12  1,000 mcg Oral Daily   Continuous: . feeding supplement (JEVITY 1.2 CAL) 1,000 mL (07/20/18 0145)   PRN:  Pertinent Labs/Diagnostics:  Respiratory:07/19/18 @2020  NIF: -35 ( improving) VC: 1.4L  With good effort  Dg Swallowing Func-speech Pathology  Result Date: 07/18/2018 Objective Swallowing Evaluation: No data recorded Patient Details Name: [name] MRN: [id number] Date of Birth: 1940/12/03 Today's Date: 07/18/2018 Time: SLP Start Time (ACUTE ONLY): 1000 -SLP Stop Time (ACUTE ONLY): 1026 SLP Time Calculation (min) (ACUTE ONLY): 26 min Past Medical History: Past Medical History: Diagnosis Date . Colon polyp  . Diverticulosis   extensive 3'15. . Dysrhythmia   hx. Ablation for tachycardia, no routine cardiology visits now . ED (erectile dysfunction)  . Heart murmur   teenager . History of colon polyps  . History of kidney stones   x1 passed . Hyperlipidemia  . 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 Osteoarthritis  .  Palpitation   with svt rate 150 . Tendinitis   achiles heel spur . Testicular atrophy  . Tubular adenoma of colon  Past Surgical History: Past Surgical History: Procedure Laterality Date . ABLATION  2010  cardiac  . COLONOSCOPY   . EUS N/A 11/17/2014  Procedure: ESOPHAGEAL ENDOSCOPIC ULTRASOUND (EUS) RADIAL;  Surgeon: [name], MD;  Location: [facility];  Service: Endoscopy;  Laterality: N/A; . FINE NEEDLE ASPIRATION N/A 11/17/2014  Procedure: FINE NEEDLE ASPIRATION  (FNA) LINEAR;  Surgeon: [name], MD;  Location: [facility];  Service: Endoscopy;  Laterality: N/A;  + or- fna . FLEXIBLE SIGMOIDOSCOPY   . HEMORRHOID SURGERY    banding  . HERNIA REPAIR Bilateral   '05 bilateral hernia . KNEE ARTHROSCOPY Left   scope '08 . KNEE ARTHROSCOPY   . LAPAROSCOPIC APPENDECTOMY N/A 09/05/2014  Procedure: APPENDECTOMY LAPAROSCOPIC;  Surgeon: [name], MD;  Location: [facility];  Service: General;  Laterality: N/A; . ROTATOR CUFF REPAIR Right 2016 HPI: [name] is an 77 y.o. male with recently diagnosed seropositive MG presents to the ER after being sent by his Neurologist Dr. Narda Amber for progressive dysphagia. Patient has been progressively having difficulty with swallowing, however over the last 2 days has gotten much worse and has been barely able to swallow. Initially presented in August with left eyelid ptosis and subsequently diagnosed MG. 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 CT chest negative for thymoma. Currently on 40 mg Prednisone daily and Mestinon 30 mg TID.  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 Weakness in CN III, CN VII and XII (left slightly greather than right) was evident in decrease eyelid retraction, decreased lingual and labial tenson and strength. There is anterior spillage and poor bolus formation and lingual propulsion with more passive tactics used for pharyngeal transit. Severe weakness observed in CN IX and X given almost absent innervation of musculature for bolus propulsion including the pharyngeal constrictors, base of tongue propulsion, hyoid excursion. Absent pharyngeal stripping and only minimal mobility of other pharyngeal musculature for laryngeal elevation and partial tilt of larynx and UES opening. Most of the bolus is penetrated before, during and  after the swallow. Pts strength is adequate for adduction of arytenoids and laryngeal sensation allowing him to protect his airway to the best of his ability. Given signs of L>R CN VII weakness, AP view also utilized to determine if pharyngeal weakness was symmetrical, which it was. Though small sized boluses were attempted with thin, nectar and honey, swallow trigger did not improve and strength so profoundly impaired that no strategies or techniques were sufficient to allow safe, functional PO intake at this time. Recommend pt remain NPO with short term alternate means of nutrition as treatment hopefully targets CN function. Exercises are not appropriate until acute treatment complete as there is high risk of muscle/respiratory fatigue. Will f/u for conservative trials and signs of subjective improvement.  SLP Visit Diagnosis Dysphagia, oropharyngeal phase (R13.12) Attention and concentration deficit following -- Frontal lobe and executive function deficit following -- Impact on safety and function Severe aspiration risk;Risk for inadequate nutrition/hydration   No flowsheet data found.  No flowsheet data found. CHL IP DIET RECOMMENDATION 07/18/2018 SLP Diet Recommendations NPO;Alternative means - temporary Liquid Administration via -- Medication Administration -- Compensations -- Postural Changes --   No flowsheet data found.  No flowsheet data found.  No flowsheet data  found.     CHL IP ORAL PHASE 07/18/2018 Oral Phase Impaired Oral - Pudding Teaspoon -- Oral - Pudding Cup -- Oral - Honey Teaspoon -- Oral - Honey Cup Weak lingual manipulation;Lingual pumping;Incomplete tongue to palate contact;Reduced posterior propulsion;Lingual/palatal residue;Delayed oral transit;Decreased bolus cohesion Oral - Nectar Teaspoon Weak lingual manipulation;Lingual pumping;Incomplete tongue to palate contact;Reduced posterior propulsion;Lingual/palatal residue;Delayed oral transit;Decreased bolus cohesion Oral - Nectar Cup -- Oral -  Nectar Straw -- Oral - Thin Teaspoon Weak lingual manipulation;Lingual pumping;Incomplete tongue to palate contact;Reduced posterior propulsion;Lingual/palatal residue;Delayed oral transit;Decreased bolus cohesion Oral - Thin Cup -- Oral - Thin Straw -- Oral - Puree -- Oral - Mech Soft -- Oral - Regular -- Oral - Multi-Consistency -- Oral - Pill -- Oral Phase - Comment --  CHL IP PHARYNGEAL PHASE 07/18/2018 Pharyngeal Phase Impaired Pharyngeal- Pudding Teaspoon -- Pharyngeal -- Pharyngeal- Pudding Cup -- Pharyngeal -- Pharyngeal- Honey Teaspoon -- Pharyngeal -- Pharyngeal- Honey Cup Reduced pharyngeal peristalsis;Reduced epiglottic inversion;Reduced anterior laryngeal mobility;Reduced tongue base retraction;Delayed swallow initiation-pyriform sinuses;Penetration/Aspiration before swallow;Penetration/Aspiration during swallow;Penetration/Apiration after swallow;Pharyngeal residue - valleculae;Pharyngeal residue - pyriform;Moderate aspiration Pharyngeal Material enters airway, passes BELOW cords and not ejected out despite cough attempt by patient Pharyngeal- Nectar Teaspoon Reduced pharyngeal peristalsis;Reduced epiglottic inversion;Reduced anterior laryngeal mobility;Reduced tongue base retraction;Delayed swallow initiation-pyriform sinuses;Penetration/Aspiration before swallow;Penetration/Aspiration during swallow;Penetration/Apiration after swallow;Pharyngeal residue - valleculae;Pharyngeal residue - pyriform;Trace aspiration Pharyngeal Material enters airway, passes BELOW cords and not ejected out despite cough attempt by patient;Material enters airway, CONTACTS cords and then ejected out;Material does not enter airway Pharyngeal- Nectar Cup -- Pharyngeal -- Pharyngeal- Nectar Straw -- Pharyngeal -- Pharyngeal- Thin Teaspoon -- Pharyngeal -- Pharyngeal- Thin Cup -- Pharyngeal -- Pharyngeal- Thin Straw -- Pharyngeal -- Pharyngeal- Puree -- Pharyngeal -- Pharyngeal- Mechanical Soft -- Pharyngeal -- Pharyngeal-  Regular -- Pharyngeal -- Pharyngeal- Multi-consistency -- Pharyngeal -- Pharyngeal- Pill -- Pharyngeal -- Pharyngeal Comment --  No flowsheet data found. DeBlois, Katherene Ponto 07/18/2018, 11:44 AM              Assessment:  77 y.o. male with recently diagnosed seropositive MG presents ptosis , facial weakness and worsening dysphagia consistent with myasthenia gravis exacerbation.  Does not  appear to have respiratory compromise, however has some neck flexion weakness and would monitor closely.  Patient has received 3 days of IVIG.  Has made clinical improvement.     Recommendations:  - continue IVIG 0.4g/kg x 5 days  ( 3rd dose today) last dose due 07/23/18 - NIF and FVC q8h,  - Continue home steroids -Speech therapy re- evaluation ( Monday) - Check for underlying infection - Regular neuro checks -Neurology will continue to follow  Laurey Morale, MSN, NP-C [facility] [phone]   07/20/2018, 7:42 AM  NEUROHOSPITALIST ADDENDUM Performed a face to face diagnostic evaluation.   I have reviewed the contents of history and physical exam as documented by PA/ARNP/Resident and agree with above documentation.  I have discussed and formulated the above plan as documented. Edits to the note have been made as needed.  Patient is improving with IVIG.  Ptosis in his left eye is improved.  Speech is also improved.  I suspect that he will be able to swallow soon.  Continue 5 days of IVIG.    Karena Addison Terance Pomplun MD [facility] [phone]   If 7pm to 7am, please call on call as listed on AMION.

## 2018-07-21 ENCOUNTER — Telehealth: Payer: Self-pay | Admitting: Neurology

## 2018-07-21 LAB — BASIC METABOLIC PANEL
Anion gap: 5 (ref 5–15)
BUN: 17 mg/dL (ref 8–23)
CO2: 30 mmol/L (ref 22–32)
Calcium: 8.5 mg/dL — ABNORMAL LOW (ref 8.9–10.3)
Chloride: 105 mmol/L (ref 98–111)
Creatinine, Ser: 0.75 mg/dL (ref 0.61–1.24)
GFR calc Af Amer: >60 mL/min (ref >60)
GFR calc non Af Amer: >60 mL/min (ref >60)
Glucose, Bld: 112 mg/dL — ABNORMAL HIGH (ref 70–99)
Potassium: 3.5 mmol/L (ref 3.5–5.1)
Sodium: 140 mmol/L (ref 135–145)

## 2018-07-21 MED ORDER — POTASSIUM CHLORIDE 10 MEQ/100ML IV SOLN
10.0000 meq | INTRAVENOUS | Status: AC
  Filled 2018-07-21: qty 100

## 2018-07-21 MED ORDER — POTASSIUM CHLORIDE 20 MEQ PO PACK
40.0000 meq | PACK | Freq: Once | ORAL | Status: AC
  Administered 2018-07-21: 40 meq via ORAL
  Filled 2018-07-21: qty 2

## 2018-07-21 NOTE — Telephone Encounter (Signed)
Called and spoke to patient's wife.  He has rec'd 3 doses of IVIG and will be getting two more treatments.  They have noticed improved swallowing and ptosis, but he still remains weak.  Will plan to start PA for IVIG through [facility] for 1g/kg every 4 weeks for myasthenia, in the event that we need to keep him on maintenance therapy.  I will see him in the clinic on 10/9 at [place] appointment for this week.  Catori Panozzo K. [name], DO

## 2018-07-21 NOTE — Telephone Encounter (Signed)
Noted  

## 2018-07-21 NOTE — Care Management Note (Addendum)
Case Management Note  Patient Details  Name: [name] MRN: [id number] Date of Birth: [birth year]  Subjective/Objective:     Presented with dysphagia, recently diagnosed with Myasthenia Gravis (05/2018). Hx of BPH, HPL. From home with spouse.   Jaymien Landin (Spouse)      [phone]      PCP: Hulan Fess  Action/Plan:  Pt receiving IVIG til 9/25... Neuro to clear. SLP following for dysphagia... NCM will continue to monitor for transition of care needs.  Expected Discharge Date:    07/22/2018              Expected Discharge Plan:  Home/Self Care  In-House Referral:     Discharge planning Services  CM Consult  Post Acute Care Choice:    Choice offered to:     DME Arranged:   N/A DME Agency:   N/A  HH Arranged:   [place] Agency:   PT,RN,SLP  Status of Service:  Completed  If discussed at [place] of Stay Meetings, dates discussed:    Additional Comments:  Sharin Mons, RN 07/21/2018, 11:03 AM

## 2018-07-21 NOTE — Progress Notes (Signed)
NIF -20 cmh20 and VC 1.9 L/min.  Great effort and tolerated well.

## 2018-07-21 NOTE — Telephone Encounter (Signed)
FYI

## 2018-07-21 NOTE — Care Management Important Message (Signed)
Important Message  Patient Details  Name: [name] MRN: [id number] Date of Birth: [birth year]   Medicare Important Message Given:  Yes    Yusuke Beza Montine Circle 07/21/2018, 2:53 PM

## 2018-07-21 NOTE — Progress Notes (Signed)
After three good attempts these were the average results or NIF/VC testing.  NIF: >-40 cmH20  VC: 1.8 L

## 2018-07-21 NOTE — Telephone Encounter (Signed)
Patient's wife called stating that the patient was in the hospital. He is on a feeding tube currently. She was wanting to speak with Dr. Eusebio Friendly regarding her husband's health. She wanted to know which steps she should take moving forward. Please call her back at [phone]. Thanks!

## 2018-07-21 NOTE — Progress Notes (Signed)
  Speech Language Pathology Treatment: Dysphagia  Patient Details Name: [name] MRN: [id number] DOB: [birth year] Today's Date: 07/21/2018 Time: 4098-1191 SLP Time Calculation (min) (ACUTE ONLY): 12 min  Assessment / Plan / Recommendation Clinical Impression  Pt presents with significant clinical improvements in swallow with mild residual dysphagia but improvements that now allow safe PO intake.  Pt with improved CN function, able to consume applesauce and thin liquids with adequate oral manipulation, no coughing. Pt asserts great improvement.  Regular solids continue to cause sensation of residue at base of throat. For today, recommend beginning a full liquid diet - advised pt to eat/drink cautiously.  Will f/u next date for readiness to advance solids.  Recommend D/Cing cortrak at this time.   HPI HPI: [name] is an 77 y.o. male with recently diagnosed seropositive MG presents to the ER after being sent by his Neurologist Dr. Narda Amber for progressive dysphagia. Patient has been progressively having difficulty with swallowing, however over the last 2 days has gotten much worse and has been barely able to swallow. Initially presented in August with left eyelid ptosis and subsequently diagnosed MG. 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 CT chest negative for thymoma. Currently on 40 mg Prednisone daily and Mestinon 30 mg TID.       SLP Plan  Continue with current plan of care       Recommendations  Diet recommendations: Other(comment)(full liquids) Liquids provided via: Cup Medication Administration: Crushed with puree Supervision: Patient able to self feed                Oral Care Recommendations: Oral care BID Follow up Recommendations: None SLP Visit Diagnosis: Dysphagia, oropharyngeal phase (R13.12) Plan: Continue with current plan of care       GO                [name] 07/21/2018, 10:55 AM

## 2018-07-21 NOTE — Progress Notes (Signed)
@IPLOG @        PROGRESS NOTE                                                                                                                                                                                                             Patient Demographics:    Latravis Grine, is a 77 y.o. male, DOB - [birth year], IHK:[id number]  Admit date - 07/17/2018   Admitting Physician Kinnie Feil, MD  Outpatient Primary MD for the patient is Hulan Fess, MD  LOS - 4  Chief Complaint  Patient presents with  . Myasthenia Gravis       Brief Narrative  Alexio Sroka is a 77 y.o. male with PMH of BPH, HPL, recent diagnosis of myasthenia (05/2018) started on prednisone, mestinon presented with dysphasia. Patient states that he has been experiencing progressive difficulty with swallowing for two days. He reports worsening his right eye ptosis with diplopia along with difficulty in swallowing, he was diagnosed with myasthenia crisis and admitted to the hospital after neurology was consulted.   Subjective:   Patient in bed, appears comfortable, denies any headache, no fever, no chest pain or pressure, no shortness of breath , no abdominal pain. No focal weakness. Improved oral weakness and L Ptosis.   Assessment  & Plan :     1.  Myasthenia gravis exacerbation.  With left eye ptosis and oropharyngeal weakness along with dysphagia.  Neuro on board, continue IVIG along with pyridostigmine and home dose prednisone, last dose of IVIG on 07/23/2018, continue continuous pulse ox monitoring, continue monitoring NIF 3 times a day, NIF on 07/20/2018 is around -20 with a FVC of 1.7lit.    He is currently in no distress in terms of shortness of breath.  His left eye ptosis and oropharyngeal weakness is much improved and will have speech reevaluate to see if NG tube can be discontinued and oral diet can be commenced.  So far has been cleared by PT for home discharge.  Last IVIG treatment on 07/23/2018.  Thymoma has  been ruled out.   2.  BPH.  Continue alpha-blocker.  3.  Dysphagia.  Seen by speech likely due to #1 above, currently has NG tube feeds, speech to reevaluate on 07/21/2018 as weakness has improved.  4.  Dyslipidemia.  On statin.  5.  Hypertension.  Lopressor twice daily added with NG tube.  We will continue to monitor.  6.  Hypokalemia.  Replaced.   Family Communication  :  None  Code Status :  Full  Disposition Plan  :  Med  Consults  :  Neuro  Procedures  :    DVT Prophylaxis  :  Lovenox    Lab Results  Component Value Date   PLT 240 07/20/2018    Diet :  Diet Order            Diet NPO time specified  Diet effective now               Inpatient Medications Scheduled Meds: . aspirin  325 mg Oral Daily  . atorvastatin  10 mg Oral q1800  . doxazosin  8 mg Oral Daily  . enoxaparin (LOVENOX) injection  40 mg Subcutaneous Q24H  . feeding supplement (PRO-STAT SUGAR FREE 64)  30 mL Per Tube BID  . finasteride  5 mg Oral Daily  . Immune Globulin 10%  400 mg/kg Intravenous Q24 Hr x 5  . metoprolol tartrate  50 mg Per Tube BID  . predniSONE  40 mg Oral Q breakfast  . pyridostigmine  30 mg Oral 3 times per day  . vitamin B-12  1,000 mcg Oral Daily   Continuous Infusions: . feeding supplement (JEVITY 1.2 CAL) 60 mL/hr at 07/20/18 1557   PRN Meds:.  Antibiotics  :   Anti-infectives (From admission, onward)   None          Objective:   Vitals:   07/20/18 2009 07/20/18 2109 07/20/18 2135 07/21/18 0509  BP: 131/79 129/82 135/83 129/75  Pulse: 63 64 65 71  Resp: 18 18 16    Temp: 98.6 F (37 C) 98.5 F (36.9 C) 98.1 F (36.7 C) 98.7 F (37.1 C)  TempSrc: Oral Oral Oral Oral  SpO2: 95% 94% 93% 92%  Weight:      Height:        Wt Readings from Last 3 Encounters:  07/17/18 88.1 kg  07/02/18 92.3 kg  06/15/18 93.9 kg     Intake/Output Summary (Last 24 hours) at 07/21/2018 0929 Last data filed at 07/21/2018 0502 Gross per 24 hour  Intake 924.49 ml   Output 915 ml  Net 9.49 ml     Physical Exam  Awake Alert, Oriented X 3, No new F.N deficits, Normal affect [place].AT,PERRAL, NG in place, L ptosis 80% improved Supple Neck,No JVD, No cervical lymphadenopathy appriciated.  Symmetrical Chest wall movement, Good air movement bilaterally, CTAB RRR,No Gallops, Rubs or new Murmurs, No Parasternal Heave +ve B.Sounds, Abd Soft, No tenderness, No organomegaly appriciated, No rebound - guarding or rigidity. No Cyanosis, Clubbing or edema, No new Rash or bruise     Data Review:    CBC Recent Labs  Lab 07/17/18 1218 07/19/18 0452 07/20/18 0549  WBC 10.4 7.8 8.3  HGB 14.9 14.6 14.5  HCT 46.6 45.3 45.1  PLT 230 233 240  MCV 90.8 89.3 89.8  MCH 29.0 28.8 28.9  MCHC 32.0 32.2 32.2  RDW 13.2 13.2 13.4  LYMPHSABS 1.8  --   --   MONOABS 0.7  --   --   EOSABS 0.1  --   --   BASOSABS 0.0  --   --     Chemistries  Recent Labs  Lab 07/17/18 1218 07/19/18 0452 07/20/18 0549 07/21/18 0523  NA 144 141 143 140  K 3.5 3.7 3.3* 3.5  CL 104 104 103 105  CO2 30 29 28 30   GLUCOSE 101* 120* 101* 112*  BUN 13 14 17 17   CREATININE 0.90 0.81 0.77 0.75  CALCIUM 8.8* 8.9 9.0 8.5*  MG  --  2.4 2.3  --   AST 18  --  16  --   ALT 21  --  16  --   ALKPHOS 62  --  60  --   BILITOT 1.0  --  0.9  --    ------------------------------------------------------------------------------------------------------------------ No results for input(s): CHOL, HDL, LDLCALC, TRIG, CHOLHDL, LDLDIRECT in the last 72 hours.  Lab Results  Component Value Date   HGBA1C 5.3 06/14/2018   ------------------------------------------------------------------------------------------------------------------ No results for input(s): TSH, T4TOTAL, T3FREE, THYROIDAB in the last 72 hours.  Invalid input(s): FREET3 ------------------------------------------------------------------------------------------------------------------ No results for input(s): VITAMINB12, FOLATE,  FERRITIN, TIBC, IRON, RETICCTPCT in the last 72 hours.  Coagulation profile No results for input(s): INR, PROTIME in the last 168 hours.  No results for input(s): DDIMER in the last 72 hours.  Cardiac Enzymes No results for input(s): CKMB, TROPONINI, MYOGLOBIN in the last 168 hours.  Invalid input(s): CK ------------------------------------------------------------------------------------------------------------------ No results found for: BNP  Micro Results Recent Results (from the past 240 hour(s))  MRSA PCR Screening     Status: None   Collection Time: 07/18/18  4:13 AM  Result Value Ref Range Status   MRSA by PCR NEGATIVE NEGATIVE Final    Comment:        The GeneXpert MRSA Assay (FDA approved for NASAL specimens only), is one component of a comprehensive MRSA colonization surveillance program. It is not intended to diagnose MRSA infection nor to guide or monitor treatment for MRSA infections. Performed at [facility], [place] [address], [place], [place] [address]     Radiology Reports Ct Chest W Contrast  Result Date: 07/11/2018 CLINICAL DATA:  Back pain.  Evaluate thymus gland. EXAM: CT CHEST WITH CONTRAST TECHNIQUE: Multidetector CT imaging of the chest was performed during intravenous contrast administration. CONTRAST:  15mL ISOVUE-300 IOPAMIDOL (ISOVUE-300) INJECTION 61% COMPARISON:  10/27/2014 FINDINGS: Cardiovascular: The heart size appears within normal limits. No pericardial effusion. Aortic atherosclerosis. Calcification in the RCA noted. Mediastinum/Nodes: Normal appearance of the thyroid gland. The trachea appears patent and is midline. Normal appearance of the esophagus. No residual or recurrent thymus tissue identified. No mediastinal mass identified. No mediastinal or hilar adenopathy. Lungs/Pleura: Mild to moderate changes of emphysema identified. No pleural effusion noted. Faint ground-glass attenuating nodule within the right upper lobe measures 8 mm and  is stable from 10/27/2014. Chronic appearing scar like densities noted within the lower lobes bilaterally. Unchanged from previous exam. New subpleural nodule within the left upper lobe is nonspecific measuring 6 mm, image 41/5. Upper Abdomen: Scattered simple appearing liver cysts are again identified involving both lobes. The gallbladder appears within normal limits. No biliary ductal dilatation. Indeterminate intermediate attenuating structure within the left upper quadrant of the abdomen measures 3.2 cm by 2.1 cm. On the previous exam this measured 3.5 by 2.3 cm. This is compatible with a benign abnormality. Musculoskeletal: Spondylosis identified within the thoracic spine. No aggressive lytic or sclerotic bone lesions. IMPRESSION: 1. No evidence for residual or recurrent thymus tissue or thymic neoplasm. 2. Unchanged 8 mm ground-glass attenuating nodule within the right upper lobe measuring 8 mm. Repeat CT is recommended every 2 years until 5 years of stability has been established. Five years of documented stability would be on 10/28/2019. This recommendation follows the consensus statement: Guidelines for Management of Incidental Pulmonary Nodules Detected on CT Images: From the Fleischner Society 2017; Radiology 2017; 284:228-243. 3. New solid nodule in the left upper lobe measures 6 mm. Non-contrast chest CT at  6-12 months is recommended. If the nodule is stable at time of repeat CT, then future CT at 18-24 months (from today's scan) is considered optional for low-risk patients, but is recommended for high-risk patients. This recommendation follows the consensus statement: Guidelines for Management of Incidental Pulmonary Nodules Detected on CT Images: From the Fleischner Society 2017; Radiology 2017; 284:228-243. 4. Aortic Atherosclerosis (ICD10-I70.0) and Emphysema (ICD10-J43.9). Electronically Signed   By: Kerby Moors M.D.   On: 07/11/2018 17:26   Mr Cervical Spine Wo Contrast  Addendum Date:  07/14/2018   ADDENDUM REPORT: 07/14/2018 12:39 ADDENDUM: Please note that impression #2 should read "foraminal impingement that is advanced on the left at C3-4 and C5-6." Electronically Signed   By: Monte Fantasia M.D.   On: 07/14/2018 12:39   Result Date: 07/14/2018 CLINICAL DATA:  Myasthenia gravis with exacerbation. Neck pain when standing for 3-4 weeks. EXAM: MRI CERVICAL SPINE WITHOUT CONTRAST TECHNIQUE: Multiplanar, multisequence MR imaging of the cervical spine was performed. No intravenous contrast was administered. COMPARISON:  Neck CTA 06/14/2018 FINDINGS: Alignment: Borderline anterolisthesis at C6-7 Vertebrae: Heterogeneous marrow without focal lesion. No discitis or fracture. Cord: Normal signal and morphology Posterior Fossa, vertebral arteries, paraspinal tissues: Negative Disc levels: C2-3: Facet spurring with probable ankylosis on the left. No herniation or impingement C3-4: Disc narrowing with asymmetric left endplate and uncovertebral ridging. Asymmetric left facet spurring with ligamentum flavum thickening. Advanced left foraminal stenosis. Mild left canal narrowing. Patent right foramen C4-5: Disc narrowing with uncovertebral ridging. Mild facet spurring greater on the left. Mild bilateral foraminal narrowing. Patent canal C5-6: Disc narrowing with uncovertebral ridging asymmetric to the left. Negative facets. Mild ligamentum flavum buckling. The canal is patent. Advanced left foraminal stenosis. Moderate appearing right foraminal narrowing that is more likely mild based on recent CTA. C6-7: Disc narrowing with shallow central protrusion. Disc narrowing causes mild bilateral foraminal narrowing. C7-T1:Mild facet spurring on the right. No herniation or impingement. IMPRESSION: 1. No acute finding. 2. Disc and facet degeneration causes foraminal impingement that is advanced on the left at L3-4 and on the left at C5-6. 3. Mild right foraminal narrowing at C5-6 and C6-7. Electronically Signed:  By: Monte Fantasia M.D. On: 07/11/2018 15:07   Dg Swallowing Func-speech Pathology  Result Date: 07/18/2018 Objective Swallowing Evaluation: No data recorded Patient Details Name: Dolphus Linch MRN: [id number] Date of Birth: 1941/01/08 Today's Date: 07/18/2018 Time: SLP Start Time (ACUTE ONLY): 1000 -SLP Stop Time (ACUTE ONLY): 1026 SLP Time Calculation (min) (ACUTE ONLY): 26 min Past Medical History: Past Medical History: Diagnosis Date . Colon polyp  . Diverticulosis   extensive 3'15. . Dysrhythmia   hx. Ablation for tachycardia, no routine cardiology visits now . ED (erectile dysfunction)  . Heart murmur   teenager . History of colon polyps  . History of kidney stones   x1 passed . Hyperlipidemia  . Neuromuscular disorder ([place])   "neuroma"  . Osteoarthritis  . Palpitation   with svt rate 150 . Tendinitis   achiles heel spur . Testicular atrophy  . Tubular adenoma of colon  Past Surgical History: Past Surgical History: Procedure Laterality Date . ABLATION  2010  cardiac  . COLONOSCOPY   . EUS N/A 11/17/2014  Procedure: ESOPHAGEAL ENDOSCOPIC ULTRASOUND (EUS) RADIAL;  Surgeon: [name], MD;  Location: [facility];  Service: Endoscopy;  Laterality: N/A; . FINE NEEDLE ASPIRATION N/A 11/17/2014  Procedure: FINE NEEDLE ASPIRATION (FNA) LINEAR;  Surgeon: [name], MD;  Location: [facility];  Service: Endoscopy;  Laterality: N/A;  +  or- fna . FLEXIBLE SIGMOIDOSCOPY   . HEMORRHOID SURGERY    banding  . HERNIA REPAIR Bilateral   '05 bilateral hernia . KNEE ARTHROSCOPY Left   scope '08 . KNEE ARTHROSCOPY   . LAPAROSCOPIC APPENDECTOMY N/A 09/05/2014  Procedure: APPENDECTOMY LAPAROSCOPIC;  Surgeon: [name], MD;  Location: [facility];  Service: General;  Laterality: N/A; . ROTATOR CUFF REPAIR Right 2016 HPI: Nellie Chevalier is an 77 y.o. male with recently diagnosed seropositive MG presents to the ER after being sent by his Neurologist Dr. Narda Amber for progressive dysphagia. Patient has been progressively  having difficulty with swallowing, however over the last 2 days has gotten much worse and has been barely able to swallow. Initially presented in August with left eyelid ptosis and subsequently diagnosed MG. 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 CT chest negative for thymoma. Currently on 40 mg Prednisone daily and Mestinon 30 mg TID.  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 Weakness in CN III, CN VII and XII (left slightly greather than right) was evident in decrease eyelid retraction, decreased lingual and labial tenson and strength. There is anterior spillage and poor bolus formation and lingual propulsion with more passive tactics used for pharyngeal transit. Severe weakness observed in CN IX and X given almost absent innervation of musculature for bolus propulsion including the pharyngeal constrictors, base of tongue propulsion, hyoid excursion. Absent pharyngeal stripping and only minimal mobility of other pharyngeal musculature for laryngeal elevation and partial tilt of larynx and UES opening. Most of the bolus is penetrated before, during and after the swallow. Pts strength is adequate for adduction of arytenoids and laryngeal sensation allowing him to protect his airway to the best of his ability. Given signs of L>R CN VII weakness, AP view also utilized to determine if pharyngeal weakness was symmetrical, which it was. Though small sized boluses were attempted with thin, nectar and honey, swallow trigger did not improve and strength so profoundly impaired that no strategies or techniques were sufficient to allow safe, functional PO intake at this time. Recommend pt remain NPO with short term alternate means of nutrition as treatment hopefully targets CN function. Exercises are not appropriate until acute treatment complete as  there is high risk of muscle/respiratory fatigue. Will f/u for conservative trials and signs of subjective improvement.  SLP Visit Diagnosis Dysphagia, oropharyngeal phase (R13.12) Attention and concentration deficit following -- Frontal lobe and executive function deficit following -- Impact on safety and function Severe aspiration risk;Risk for inadequate nutrition/hydration   No flowsheet data found.  No flowsheet data found. CHL IP DIET RECOMMENDATION 07/18/2018 SLP Diet Recommendations NPO;Alternative means - temporary Liquid Administration via -- Medication Administration -- Compensations -- Postural Changes --   No flowsheet data found.  No flowsheet data found.  No flowsheet data found.     CHL IP ORAL PHASE 07/18/2018 Oral Phase Impaired Oral - Pudding Teaspoon -- Oral - Pudding Cup -- Oral - Honey Teaspoon -- Oral - Honey Cup Weak lingual manipulation;Lingual pumping;Incomplete tongue to palate contact;Reduced posterior propulsion;Lingual/palatal residue;Delayed oral transit;Decreased bolus cohesion Oral - Nectar Teaspoon Weak lingual manipulation;Lingual pumping;Incomplete tongue to palate contact;Reduced posterior propulsion;Lingual/palatal residue;Delayed oral transit;Decreased bolus cohesion Oral - Nectar Cup -- Oral - Nectar Straw -- Oral - Thin Teaspoon Weak lingual manipulation;Lingual pumping;Incomplete tongue to palate contact;Reduced posterior propulsion;Lingual/palatal residue;Delayed oral transit;Decreased bolus cohesion Oral - Thin Cup --  Oral - Thin Straw -- Oral - Puree -- Oral - Mech Soft -- Oral - Regular -- Oral - Multi-Consistency -- Oral - Pill -- Oral Phase - Comment --  CHL IP PHARYNGEAL PHASE 07/18/2018 Pharyngeal Phase Impaired Pharyngeal- Pudding Teaspoon -- Pharyngeal -- Pharyngeal- Pudding Cup -- Pharyngeal -- Pharyngeal- Honey Teaspoon -- Pharyngeal -- Pharyngeal- Honey Cup Reduced pharyngeal peristalsis;Reduced epiglottic inversion;Reduced anterior laryngeal mobility;Reduced  tongue base retraction;Delayed swallow initiation-pyriform sinuses;Penetration/Aspiration before swallow;Penetration/Aspiration during swallow;Penetration/Apiration after swallow;Pharyngeal residue - valleculae;Pharyngeal residue - pyriform;Moderate aspiration Pharyngeal Material enters airway, passes BELOW cords and not ejected out despite cough attempt by patient Pharyngeal- Nectar Teaspoon Reduced pharyngeal peristalsis;Reduced epiglottic inversion;Reduced anterior laryngeal mobility;Reduced tongue base retraction;Delayed swallow initiation-pyriform sinuses;Penetration/Aspiration before swallow;Penetration/Aspiration during swallow;Penetration/Apiration after swallow;Pharyngeal residue - valleculae;Pharyngeal residue - pyriform;Trace aspiration Pharyngeal Material enters airway, passes BELOW cords and not ejected out despite cough attempt by patient;Material enters airway, CONTACTS cords and then ejected out;Material does not enter airway Pharyngeal- Nectar Cup -- Pharyngeal -- Pharyngeal- Nectar Straw -- Pharyngeal -- Pharyngeal- Thin Teaspoon -- Pharyngeal -- Pharyngeal- Thin Cup -- Pharyngeal -- Pharyngeal- Thin Straw -- Pharyngeal -- Pharyngeal- Puree -- Pharyngeal -- Pharyngeal- Mechanical Soft -- Pharyngeal -- Pharyngeal- Regular -- Pharyngeal -- Pharyngeal- Multi-consistency -- Pharyngeal -- Pharyngeal- Pill -- Pharyngeal -- Pharyngeal Comment --  No flowsheet data found. DeBlois, Katherene Ponto 07/18/2018, 11:44 AM               Time Spent in minutes  30   Lala Lund M.D on 07/21/2018 at 9:29 AM  To page go to [email or web address] - password [facility]

## 2018-07-21 NOTE — Telephone Encounter (Signed)
His appt is cancelled for this week and the patient is coming in on 10/9 at 11am. I let his wife know. Thanks!

## 2018-07-21 NOTE — Progress Notes (Signed)
RN paged because pt stated the "doctor" told him his cortrak could be d/c'd today. NP researched chart. Attending wrote that we would follow speech recommendations after evaluation. SLP wrote to d/c Cortrak and gave instructions for diet. Therefore, will d/c Cortrak and tube feeds at this time.  KJKG, NP [facility]

## 2018-07-22 ENCOUNTER — Telehealth: Payer: Self-pay | Admitting: Neurology

## 2018-07-22 LAB — BASIC METABOLIC PANEL
Anion gap: 7 (ref 5–15)
BUN: 15 mg/dL (ref 8–23)
CO2: 30 mmol/L (ref 22–32)
Calcium: 8.6 mg/dL — ABNORMAL LOW (ref 8.9–10.3)
Chloride: 103 mmol/L (ref 98–111)
Creatinine, Ser: 0.83 mg/dL (ref 0.61–1.24)
GFR calc Af Amer: >60 mL/min (ref >60)
GFR calc non Af Amer: >60 mL/min (ref >60)
Glucose, Bld: 93 mg/dL (ref 70–99)
Potassium: 3.5 mmol/L (ref 3.5–5.1)
Sodium: 140 mmol/L (ref 135–145)

## 2018-07-22 LAB — CBC
HCT: 41.4 % (ref 39.0–52.0)
Hemoglobin: 13.3 g/dL (ref 13.0–17.0)
MCH: 28.9 pg (ref 26.0–34.0)
MCHC: 32.1 g/dL (ref 30.0–36.0)
MCV: 90 fL (ref 78.0–100.0)
Platelets: 196 10*3/uL (ref 150–400)
RBC: 4.6 MIL/uL (ref 4.22–5.81)
RDW: 13.5 % (ref 11.5–15.5)
WBC: 6.2 10*3/uL (ref 4.0–10.5)

## 2018-07-22 LAB — MAGNESIUM: Magnesium: 2.2 mg/dL (ref 1.7–2.4)

## 2018-07-22 MED ORDER — ENSURE ENLIVE PO LIQD
237.0000 mL | Freq: Three times a day (TID) | ORAL | Status: DC
  Administered 2018-07-22 – 2018-07-23 (×3): 237 mL via ORAL

## 2018-07-22 NOTE — Progress Notes (Signed)
Still waiting for IVIG to be started by IV team.

## 2018-07-22 NOTE — Progress Notes (Signed)
NIF -40   VC 1.8 L   Great patient effort

## 2018-07-22 NOTE — Plan of Care (Signed)

## 2018-07-22 NOTE — Progress Notes (Signed)
Patient alert and oriented x 4, denies pain with assess.   Completed IVIG at 2230 with no distress or complications.  Corpack nasal tubing removed at bedside, per orders, with no adverse reactions.  Continues to show daily improvement the IVIG administration.  Will continue to monitor.

## 2018-07-22 NOTE — Telephone Encounter (Signed)
Please advise 

## 2018-07-22 NOTE — Progress Notes (Addendum)
  Speech Language Pathology Treatment: Dysphagia  Patient Details Name: [name] MRN: [id number] DOB: 07/04/1941 Today's Date: 07/22/2018 Time: 0850-0903 SLP Time Calculation (min) (ACUTE ONLY): 13 min  Assessment / Plan / Recommendation Clinical Impression  Pt denies any difficulties with full liquid diet or meds crushed in puree. Upon advanced trials of soft solids and straw sips of thin liquids, he initially has no overt signs of difficulty. However, after several bites, he starts to c/o difficulty getting solids to "go down." He starts to have throat clearing, and then subsequent boluses of liquids he has throat clearing and coughing. He seems to have adequate awareness of dysphagia, acknowledging textures that are difficult.  Given MBS results, this could be concerning for decreased airway protection and residuals. Recommend maintaining full liquid diet for now with additional SLP f/u for advancement of solids. Precautions and education reviewed, including potential for fatigue, need for energy conservation strategies, no straws.    HPI HPI: [name] is an 77 y.o. male with recently diagnosed seropositive MG presents to the ER after being sent by his Neurologist Dr. Narda Amber for progressive dysphagia. Patient has been progressively having difficulty with swallowing, however over the last 2 days has gotten much worse and has been barely able to swallow. Initially presented in August with left eyelid ptosis and subsequently diagnosed MG. 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 CT chest negative for thymoma. Currently on 40 mg Prednisone daily and Mestinon 30 mg TID.       SLP Plan  Continue with current plan of care       Recommendations  Diet recommendations: Dysphagia 1 (puree)(full liquids) Liquids provided via: Cup;No straw Medication Administration: Crushed with puree Supervision: Patient able to self feed Compensations: Slow rate;Small sips/bites Postural Changes and/or Swallow  Maneuvers: Seated upright 90 degrees                Oral Care Recommendations: Oral care BID Follow up Recommendations: (tba) SLP Visit Diagnosis: Dysphagia, oropharyngeal phase (R13.12) Plan: Continue with current plan of care       GO                Germain Osgood 07/22/2018, 9:17 AM  Germain Osgood, M.A. [place] [facility] Environmental education officer [phone] Office [phone]

## 2018-07-22 NOTE — Progress Notes (Signed)
Still waiting for the IV team to start IVIG has been ready.

## 2018-07-22 NOTE — Progress Notes (Signed)
IVIG protocol was carried out.

## 2018-07-22 NOTE — Progress Notes (Signed)
NIF -20 VC 1.8 L  Great effort

## 2018-07-22 NOTE — Telephone Encounter (Signed)
Patient's wife called in and left a voicemail wanting to know if Dr. [name] knew her husband was being released fromt he hospital soon. He is currently doing the IVIG and she said that they wanted to release him 1 hour after he completes this. Please call her back at [phone]. Thanks!

## 2018-07-22 NOTE — Progress Notes (Signed)
Nutrition Follow-up  DOCUMENTATION CODES:   Severe malnutrition in context of acute illness/injury  INTERVENTION:   Ensure Enlive po TID, each supplement provides 350 kcal and 20 grams of protein  NUTRITION DIAGNOSIS:   Severe Malnutrition related to dysphagia, acute illness(acute exacerbation of myasthenia gravis) as evidenced by mild fat depletion, moderate fat depletion, mild muscle depletion, moderate muscle depletion, percent weight loss(6.2% weight loss in 1 month).  Ongoing  GOAL:   Patient will meet greater than or equal to 90% of their needs  Not meeting  MONITOR:   Diet advancement, I & O's, Labs, Weight trends  REASON FOR ASSESSMENT:   Malnutrition Screening Tool, Consult Enteral/tube feeding initiation and management  ASSESSMENT:   77 year old male who presented to the ED on 9/19 with dysphasia, dysarthria, and ptosis of the left eye. Pt was recently diagnosed with Myasthenia Gravis. PMH significant for hyperlipidemia, diverticulosis, and osteoarthritis. Pt admitted with acute exacerbation of myasthenia for IVIG x 5 days.   9/20- gastric cortrak placed- Jevity 1.2 @ 60 ml/hr, 30 ml Prostat BID 9/23- cortrak d/c 9/24- SLP recommends full liquid diet   Pt continues to have issues swallowing. Plan for pt to d/c tomorrow with home SLP services. They will advance diet as tolerated.  Pt tolerating pudding, cream soups, broths, and all beverages at this time.   Reviewed clear and full liquids. Provided examples of appropriate foods on a liquid diet. Recommended use of a liquid multivitamin while following a liquid diet. Reviewed acceptable protein supplements.  A recent weight has not been obtained since 9/19.   Medications reviewed and include: prednisone, Vit B12 Labs reviewed.   Diet Order:   Diet Order            Diet full liquid Room service appropriate? Yes; Fluid consistency: Thin  Diet effective now              EDUCATION NEEDS:   No  education needs have been identified at this time  Skin:  Skin Assessment: Reviewed RN Assessment  Last BM:  07/16/18  Height:   Ht Readings from Last 1 Encounters:  07/17/18 6' (1.829 m)    Weight:   Wt Readings from Last 1 Encounters:  07/17/18 88.1 kg    Ideal Body Weight:  80.91 kg  BMI:  Body mass index is 26.34 kg/m.  Estimated Nutritional Needs:   Kcal:  1800-2000  Protein:  95-110 grams  Fluid:  1.8-2.0  Mariana Single RD, LDN Clinical Nutrition Pager # [phone]

## 2018-07-22 NOTE — Discharge Instructions (Signed)
Continue home dose prednisone and Mestinon unchanged.  Follow-up with your neurologist within a week.    Follow with Primary MD Hulan Fess, MD and your neurologist in 7 days   Get CBC, CMP, 2 view Chest X ray checked  by Primary MD  in 5-7 days    Activity: As tolerated with Full fall precautions use walker/cane & assistance as needed  Disposition Home    Diet: Full liquid diet with full aspiration precautions, follow instructions given by speech therapist.  For Heart failure patients - Check your Weight same time everyday, if you gain over 2 pounds, or you develop in leg swelling, experience more shortness of breath or chest pain, call your Primary MD immediately. Follow Cardiac Low Salt Diet and 1.5 lit/day fluid restriction.  Special Instructions: If you have smoked or chewed Tobacco  in the last 2 yrs please stop smoking, stop any regular Alcohol  and or any Recreational drug use.  On your next visit with your primary care physician please Get Medicines reviewed and adjusted.  Please request your Prim.MD to go over all Hospital Tests and Procedure/Radiological results at the follow up, please get all Hospital records sent to your Prim MD by signing hospital release before you go home.  If you experience worsening of your admission symptoms, develop shortness of breath, life threatening emergency, suicidal or homicidal thoughts you must seek medical attention immediately by calling 911 or calling your MD immediately  if symptoms less severe.  You Must read complete instructions/literature along with all the possible adverse reactions/side effects for all the Medicines you take and that have been prescribed to you. Take any new Medicines after you have completely understood and accpet all the possible adverse reactions/side effects.

## 2018-07-22 NOTE — Discharge Summary (Addendum)
Othon Guardia OEV:[id number] DOB: 08-18-1941 DOA: 07/17/2018  PCP: Hulan Fess, MD  Admit date: 07/17/2018  Discharge date: 07/22/2018  Admitted From: Home  Disposition:  Home   Recommendations for Outpatient Follow-up:   Follow up with PCP in 1-2 weeks  PCP Please obtain BMP/CBC, 2 view CXR in 1week,  (see Discharge instructions)   PCP Please follow up on the following pending results:    Home Health: PT, RN< Speech   Equipment/Devices: None  Consultations: Neuro Discharge Condition: Stable   CODE STATUS: Full   Diet Recommendation: Full liquid diet with aspiration precautions   Chief Complaint  Patient presents with  . Myasthenia Gravis     Brief history of present illness from the day of admission and additional interim summary    [name] a 77 y.o.malewith PMH of BPH, HPL, recent diagnosis of myasthenia (05/2018) started on prednisone, mestinon presented with dysphasia. Patient states that he has been experiencing progressive difficulty with swallowing for two days. He reports worsening his right eye ptosis with diplopia along with difficulty in swallowing, he was diagnosed with myasthenia crisis and admitted to the hospital after neurology was consulted.                                                                 Hospital Course    1.  Myasthenia gravis exacerbation with a dysphasia and left eye ptosis.  Was seen by neurology and he was placed on 5 twice daily treatments with last being on 07/22/2018, he initially required NG tube feeds, with treatments his swallowing has improved and he is now full liquid diet, left eye ptosis is almost completely resolved, he has no shortness of breath or difficulty in maintaining airway.  Case discussed with neurologist on-call today Dr. Cheral Marker, he  will be discharged on his home dose prednisone and Mestinon with outpatient neuro and PCP follow-up.  Home speech and PT will also follow the patient.   2.  BPH.  Continue alpha-blocker.  3.  Dysphagia. As in #1.  4.  Dyslipidemia.  On statin.  5.  Hypertension.  Lopressor twice daily added with NG tube.  We will continue to monitor.  6.  Hypokalemia.  Replaced.   Addendum.  Was called by patient's wife around 4 PM.  She told me that even though the discharge paperwork was filed around 9 AM family was not informed about the discharge until about 4 PM this evening about the discharge, patient is alert awake and he was clearly by me about this plan at 7.30 AM this morning, he agreed with the plan however looks like he never communicated this to his family, unfortunately family was not informed about this by the nursing staff or case management as well.  She says that she is not prepared for  him to come home today, she requests patient be discharged early tomorrow.  Have told her that we will keep the patient here overnight.    Discharge diagnosis     Active Problems:   BPH (benign prostatic hyperplasia)   Myasthenia gravis with exacerbation (HCC)   Myasthenia (HCC)   Dysphasia   Protein-calorie malnutrition, severe    Discharge instructions    Discharge Instructions    Discharge instructions   Complete by:  As directed    Continue home dose prednisone and Mestinon unchanged.  Follow-up with your neurologist within a week.    Follow with Primary MD Hulan Fess, MD and your neurologist in 7 days   Get CBC, CMP, 2 view Chest X ray checked  by Primary MD  in 5-7 days    Activity: As tolerated with Full fall precautions use walker/cane & assistance as needed  Disposition Home    Diet: Full liquid diet with full aspiration precautions, follow instructions given by speech therapist.  For Heart failure patients - Check your Weight same time everyday, if you gain over 2  pounds, or you develop in leg swelling, experience more shortness of breath or chest pain, call your Primary MD immediately. Follow Cardiac Low Salt Diet and 1.5 lit/day fluid restriction.  Special Instructions: If you have smoked or chewed Tobacco  in the last 2 yrs please stop smoking, stop any regular Alcohol  and or any Recreational drug use.  On your next visit with your primary care physician please Get Medicines reviewed and adjusted.  Please request your Prim.MD to go over all Hospital Tests and Procedure/Radiological results at the follow up, please get all Hospital records sent to your Prim MD by signing hospital release before you go home.  If you experience worsening of your admission symptoms, develop shortness of breath, life threatening emergency, suicidal or homicidal thoughts you must seek medical attention immediately by calling 911 or calling your MD immediately  if symptoms less severe.  You Must read complete instructions/literature along with all the possible adverse reactions/side effects for all the Medicines you take and that have been prescribed to you. Take any new Medicines after you have completely understood and accpet all the possible adverse reactions/side effects.   Increase activity slowly   Complete by:  As directed       Discharge Medications   Allergies as of 07/22/2018      Reactions   Pollen Extract Other (See Comments)   Nasal congestion      Medication List    TAKE these medications   aspirin 325 MG EC tablet Take 1 tablet (325 mg total) by mouth daily.   atorvastatin 10 MG tablet Commonly known as:  LIPITOR Take 1 tablet (10 mg total) by mouth daily at 6 PM.   CALCIUM 600+D 600-400 MG-UNIT tablet Generic drug:  Calcium Carbonate-Vitamin D Take 1 tablet by mouth daily.   doxazosin 8 MG tablet Commonly known as:  CARDURA Take 8 mg by mouth daily.   finasteride 5 MG tablet Commonly known as:  PROSCAR Take 5 mg by mouth daily.     Glucosamine HCl 1500 MG Tabs Take 1,500 mg by mouth daily.   ibuprofen 200 MG tablet Commonly known as:  ADVIL,MOTRIN Take 200 mg by mouth every 6 (six) hours as needed for headache (pain).   METAMUCIL PO Take 15 mLs by mouth daily. Mix in 8 oz water and drink   multivitamin with minerals Tabs tablet Take 1 tablet by  mouth daily.   predniSONE 10 MG tablet Commonly known as:  DELTASONE Take 20 mg by mouth daily.   predniSONE 20 MG tablet Commonly known as:  DELTASONE Take 1 tablet (20 mg total) by mouth 2 (two) times daily with a meal.   pyridostigmine 60 MG tablet Commonly known as:  MESTINON Take half tablet at 8am, 1pm, and 6pm What changed:    how much to take  how to take this  when to take this   VITAMIN B-12 PO Take 1 tablet by mouth daily.       Follow-up Information    Little, Lennette Bihari, MD. Schedule an appointment as soon as possible for a visit in 1 week(s).   Specialty:  Family Medicine Why:  Also with your neurologist within a week Contact information: [place] [address] [phone]           Major procedures and Radiology Reports - PLEASE review detailed and final reports thoroughly  -         Ct Chest W Contrast  Result Date: 07/11/2018 CLINICAL DATA:  Back pain.  Evaluate thymus gland. EXAM: CT CHEST WITH CONTRAST TECHNIQUE: Multidetector CT imaging of the chest was performed during intravenous contrast administration. CONTRAST:  18mL ISOVUE-300 IOPAMIDOL (ISOVUE-300) INJECTION 61% COMPARISON:  10/27/2014 FINDINGS: Cardiovascular: The heart size appears within normal limits. No pericardial effusion. Aortic atherosclerosis. Calcification in the RCA noted. Mediastinum/Nodes: Normal appearance of the thyroid gland. The trachea appears patent and is midline. Normal appearance of the esophagus. No residual or recurrent thymus tissue identified. No mediastinal mass identified. No mediastinal or hilar adenopathy. Lungs/Pleura:  Mild to moderate changes of emphysema identified. No pleural effusion noted. Faint ground-glass attenuating nodule within the right upper lobe measures 8 mm and is stable from 10/27/2014. Chronic appearing scar like densities noted within the lower lobes bilaterally. Unchanged from previous exam. New subpleural nodule within the left upper lobe is nonspecific measuring 6 mm, image 41/5. Upper Abdomen: Scattered simple appearing liver cysts are again identified involving both lobes. The gallbladder appears within normal limits. No biliary ductal dilatation. Indeterminate intermediate attenuating structure within the left upper quadrant of the abdomen measures 3.2 cm by 2.1 cm. On the previous exam this measured 3.5 by 2.3 cm. This is compatible with a benign abnormality. Musculoskeletal: Spondylosis identified within the thoracic spine. No aggressive lytic or sclerotic bone lesions. IMPRESSION: 1. No evidence for residual or recurrent thymus tissue or thymic neoplasm. 2. Unchanged 8 mm ground-glass attenuating nodule within the right upper lobe measuring 8 mm. Repeat CT is recommended every 2 years until 5 years of stability has been established. Five years of documented stability would be on 10/28/2019. This recommendation follows the consensus statement: Guidelines for Management of Incidental Pulmonary Nodules Detected on CT Images: From the Fleischner Society 2017; Radiology 2017; 284:228-243. 3. New solid nodule in the left upper lobe measures 6 mm. Non-contrast chest CT at 6-12 months is recommended. If the nodule is stable at time of repeat CT, then future CT at 18-24 months (from today's scan) is considered optional for low-risk patients, but is recommended for high-risk patients. This recommendation follows the consensus statement: Guidelines for Management of Incidental Pulmonary Nodules Detected on CT Images: From the Fleischner Society 2017; Radiology 2017; 284:228-243. 4. Aortic Atherosclerosis  (ICD10-I70.0) and Emphysema (ICD10-J43.9). Electronically Signed   By: Kerby Moors M.D.   On: 07/11/2018 17:26   Mr Cervical Spine Wo Contrast  Addendum Date: 07/14/2018   ADDENDUM  REPORT: 07/14/2018 12:39 ADDENDUM: Please note that impression #2 should read "foraminal impingement that is advanced on the left at C3-4 and C5-6." Electronically Signed   By: Monte Fantasia M.D.   On: 07/14/2018 12:39   Result Date: 07/14/2018 CLINICAL DATA:  Myasthenia gravis with exacerbation. Neck pain when standing for 3-4 weeks. EXAM: MRI CERVICAL SPINE WITHOUT CONTRAST TECHNIQUE: Multiplanar, multisequence MR imaging of the cervical spine was performed. No intravenous contrast was administered. COMPARISON:  Neck CTA 06/14/2018 FINDINGS: Alignment: Borderline anterolisthesis at C6-7 Vertebrae: Heterogeneous marrow without focal lesion. No discitis or fracture. Cord: Normal signal and morphology Posterior Fossa, vertebral arteries, paraspinal tissues: Negative Disc levels: C2-3: Facet spurring with probable ankylosis on the left. No herniation or impingement C3-4: Disc narrowing with asymmetric left endplate and uncovertebral ridging. Asymmetric left facet spurring with ligamentum flavum thickening. Advanced left foraminal stenosis. Mild left canal narrowing. Patent right foramen C4-5: Disc narrowing with uncovertebral ridging. Mild facet spurring greater on the left. Mild bilateral foraminal narrowing. Patent canal C5-6: Disc narrowing with uncovertebral ridging asymmetric to the left. Negative facets. Mild ligamentum flavum buckling. The canal is patent. Advanced left foraminal stenosis. Moderate appearing right foraminal narrowing that is more likely mild based on recent CTA. C6-7: Disc narrowing with shallow central protrusion. Disc narrowing causes mild bilateral foraminal narrowing. C7-T1:Mild facet spurring on the right. No herniation or impingement. IMPRESSION: 1. No acute finding. 2. Disc and facet degeneration  causes foraminal impingement that is advanced on the left at L3-4 and on the left at C5-6. 3. Mild right foraminal narrowing at C5-6 and C6-7. Electronically Signed: By: Monte Fantasia M.D. On: 07/11/2018 15:07   Dg Swallowing Func-speech Pathology  Result Date: 07/18/2018 Objective Swallowing Evaluation: No data recorded Patient Details Name: Inigo Lantigua MRN: [id number] Date of Birth: 06-Sep-1941 Today's Date: 07/18/2018 Time: SLP Start Time (ACUTE ONLY): 1000 -SLP Stop Time (ACUTE ONLY): 1026 SLP Time Calculation (min) (ACUTE ONLY): 26 min Past Medical History: Past Medical History: Diagnosis Date . Colon polyp  . Diverticulosis   extensive 3'15. . Dysrhythmia   hx. Ablation for tachycardia, no routine cardiology visits now . ED (erectile dysfunction)  . Heart murmur   teenager . History of colon polyps  . History of kidney stones   x1 passed . Hyperlipidemia  . 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 Osteoarthritis  . Palpitation   with svt rate 150 . Tendinitis   achiles heel spur . Testicular atrophy  . Tubular adenoma of colon  Past Surgical History: Past Surgical History: Procedure Laterality Date . ABLATION  2010  cardiac  . COLONOSCOPY   . EUS N/A 11/17/2014  Procedure: ESOPHAGEAL ENDOSCOPIC ULTRASOUND (EUS) RADIAL;  Surgeon: [name], MD;  Location: [facility];  Service: Endoscopy;  Laterality: N/A; . FINE NEEDLE ASPIRATION N/A 11/17/2014  Procedure: FINE NEEDLE ASPIRATION (FNA) LINEAR;  Surgeon: [name], MD;  Location: [facility];  Service: Endoscopy;  Laterality: N/A;  + or- fna . FLEXIBLE SIGMOIDOSCOPY   . HEMORRHOID SURGERY    banding  . HERNIA REPAIR Bilateral   '05 bilateral hernia . KNEE ARTHROSCOPY Left   scope '08 . KNEE ARTHROSCOPY   . LAPAROSCOPIC APPENDECTOMY N/A 09/05/2014  Procedure: APPENDECTOMY LAPAROSCOPIC;  Surgeon: [name], MD;  Location: [facility];  Service: General;  Laterality: N/A; . ROTATOR CUFF REPAIR Right 2016 HPI: [name] is an 77 y.o. male  with recently diagnosed seropositive MG presents to the ER after being sent by his Neurologist Dr. Narda Amber  for progressive dysphagia. Patient has been progressively having difficulty with swallowing, however over the last 2 days has gotten much worse and has been barely able to swallow. Initially presented in August with left eyelid ptosis and subsequently diagnosed MG. 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 CT chest negative for thymoma. Currently on 40 mg Prednisone daily and Mestinon 30 mg TID.  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 Weakness in CN III, CN VII and XII (left slightly greather than right) was evident in decrease eyelid retraction, decreased lingual and labial tenson and strength. There is anterior spillage and poor bolus formation and lingual propulsion with more passive tactics used for pharyngeal transit. Severe weakness observed in CN IX and X given almost absent innervation of musculature for bolus propulsion including the pharyngeal constrictors, base of tongue propulsion, hyoid excursion. Absent pharyngeal stripping and only minimal mobility of other pharyngeal musculature for laryngeal elevation and partial tilt of larynx and UES opening. Most of the bolus is penetrated before, during and after the swallow. Pts strength is adequate for adduction of arytenoids and laryngeal sensation allowing him to protect his airway to the best of his ability. Given signs of L>R CN VII weakness, AP view also utilized to determine if pharyngeal weakness was symmetrical, which it was. Though small sized boluses were attempted with thin, nectar and honey, swallow trigger did not improve and strength so profoundly impaired that no strategies or techniques were sufficient to allow safe, functional PO intake at this time.  Recommend pt remain NPO with short term alternate means of nutrition as treatment hopefully targets CN function. Exercises are not appropriate until acute treatment complete as there is high risk of muscle/respiratory fatigue. Will f/u for conservative trials and signs of subjective improvement.  SLP Visit Diagnosis Dysphagia, oropharyngeal phase (R13.12) Attention and concentration deficit following -- Frontal lobe and executive function deficit following -- Impact on safety and function Severe aspiration risk;Risk for inadequate nutrition/hydration   No flowsheet data found.  No flowsheet data found. CHL IP DIET RECOMMENDATION 07/18/2018 SLP Diet Recommendations NPO;Alternative means - temporary Liquid Administration via -- Medication Administration -- Compensations -- Postural Changes --   No flowsheet data found.  No flowsheet data found.  No flowsheet data found.     CHL IP ORAL PHASE 07/18/2018 Oral Phase Impaired Oral - Pudding Teaspoon -- Oral - Pudding Cup -- Oral - Honey Teaspoon -- Oral - Honey Cup Weak lingual manipulation;Lingual pumping;Incomplete tongue to palate contact;Reduced posterior propulsion;Lingual/palatal residue;Delayed oral transit;Decreased bolus cohesion Oral - Nectar Teaspoon Weak lingual manipulation;Lingual pumping;Incomplete tongue to palate contact;Reduced posterior propulsion;Lingual/palatal residue;Delayed oral transit;Decreased bolus cohesion Oral - Nectar Cup -- Oral - Nectar Straw -- Oral - Thin Teaspoon Weak lingual manipulation;Lingual pumping;Incomplete tongue to palate contact;Reduced posterior propulsion;Lingual/palatal residue;Delayed oral transit;Decreased bolus cohesion Oral - Thin Cup -- Oral - Thin Straw -- Oral - Puree -- Oral - Mech Soft -- Oral - Regular -- Oral - Multi-Consistency -- Oral - Pill -- Oral Phase - Comment --  CHL IP PHARYNGEAL PHASE 07/18/2018 Pharyngeal Phase Impaired Pharyngeal- Pudding Teaspoon -- Pharyngeal -- Pharyngeal- Pudding Cup -- Pharyngeal  -- Pharyngeal- Honey Teaspoon -- Pharyngeal -- Pharyngeal- Honey Cup Reduced pharyngeal peristalsis;Reduced epiglottic inversion;Reduced anterior laryngeal mobility;Reduced tongue base retraction;Delayed swallow initiation-pyriform sinuses;Penetration/Aspiration before swallow;Penetration/Aspiration during swallow;Penetration/Apiration after swallow;Pharyngeal residue - valleculae;Pharyngeal residue - pyriform;Moderate aspiration Pharyngeal Material enters airway, passes  BELOW cords and not ejected out despite cough attempt by patient Pharyngeal- Nectar Teaspoon Reduced pharyngeal peristalsis;Reduced epiglottic inversion;Reduced anterior laryngeal mobility;Reduced tongue base retraction;Delayed swallow initiation-pyriform sinuses;Penetration/Aspiration before swallow;Penetration/Aspiration during swallow;Penetration/Apiration after swallow;Pharyngeal residue - valleculae;Pharyngeal residue - pyriform;Trace aspiration Pharyngeal Material enters airway, passes BELOW cords and not ejected out despite cough attempt by patient;Material enters airway, CONTACTS cords and then ejected out;Material does not enter airway Pharyngeal- Nectar Cup -- Pharyngeal -- Pharyngeal- Nectar Straw -- Pharyngeal -- Pharyngeal- Thin Teaspoon -- Pharyngeal -- Pharyngeal- Thin Cup -- Pharyngeal -- Pharyngeal- Thin Straw -- Pharyngeal -- Pharyngeal- Puree -- Pharyngeal -- Pharyngeal- Mechanical Soft -- Pharyngeal -- Pharyngeal- Regular -- Pharyngeal -- Pharyngeal- Multi-consistency -- Pharyngeal -- Pharyngeal- Pill -- Pharyngeal -- Pharyngeal Comment --  No flowsheet data found. DeBlois, Katherene Ponto 07/18/2018, 11:44 AM               Micro Results     Recent Results (from the past 240 hour(s))  MRSA PCR Screening     Status: None   Collection Time: 07/18/18  4:13 AM  Result Value Ref Range Status   MRSA by PCR NEGATIVE NEGATIVE Final    Comment:        The GeneXpert MRSA Assay (FDA approved for NASAL specimens only), is one  component of a comprehensive MRSA colonization surveillance program. It is not intended to diagnose MRSA infection nor to guide or monitor treatment for MRSA infections. Performed at [facility], [place] [address] [place], [place] [address]     Today   Subjective    Lakyn Mantione today has no headache,no chest abdominal pain,no new weakness tingling or numbness, feels much better wants to go home today.     Objective   Blood pressure 132/83, pulse 67, temperature 98 F (36.7 C), temperature source Oral, resp. rate 18, height 6' (1.829 m), weight 88.1 kg, SpO2 96 %.   Intake/Output Summary (Last 24 hours) at 07/22/2018 0849 Last data filed at 07/21/2018 2213 Gross per 24 hour  Intake 0 ml  Output 450 ml  Net -450 ml    Exam Awake Alert, Oriented x 3, No new F.N deficits, Normal affect [place].AT,PERRAL, left eye ptosis almost completely resolved, much improved cough reflex Supple Neck,No JVD, No cervical lymphadenopathy appriciated.  Symmetrical Chest wall movement, Good air movement bilaterally, CTAB RRR,No Gallops,Rubs or new Murmurs, No Parasternal Heave +ve B.Sounds, Abd Soft, Non tender, No organomegaly appriciated, No rebound -guarding or rigidity. No Cyanosis, Clubbing or edema, No new Rash or bruise   Data Review   CBC w Diff:  Lab Results  Component Value Date   WBC 6.2 07/22/2018   HGB 13.3 07/22/2018   HGB 15.2 03/08/2016   HGB 15.0 11/02/2014   HCT 41.4 07/22/2018   HCT 46.7 06/14/2018   HCT 45.6 11/02/2014   PLT 196 07/22/2018   PLT 249 03/08/2016   LYMPHOPCT 17 07/17/2018   LYMPHOPCT 24.2 11/02/2014   MONOPCT 7 07/17/2018   MONOPCT 9.8 11/02/2014   EOSPCT 1 07/17/2018   EOSPCT 3.4 11/02/2014   BASOPCT 0 07/17/2018   BASOPCT 0.4 11/02/2014    CMP:  Lab Results  Component Value Date   NA 140 07/22/2018   NA 143 03/08/2016   NA 145 11/02/2014   K 3.5 07/22/2018   K 3.6 11/02/2014   CL 103 07/22/2018   CO2 30 07/22/2018   CO2 31 (H)  11/02/2014   BUN 15 07/22/2018   BUN 11 03/08/2016   BUN  10.0 11/02/2014   CREATININE 0.83 07/22/2018   CREATININE 0.8 11/02/2014   PROT 6.8 07/20/2018   PROT 6.8 03/08/2016   PROT 7.1 11/02/2014   ALBUMIN 3.1 (L) 07/20/2018   ALBUMIN 4.2 03/08/2016   ALBUMIN 3.9 11/02/2014   BILITOT 0.9 07/20/2018   BILITOT 0.9 03/08/2016   BILITOT 0.85 11/02/2014   ALKPHOS 60 07/20/2018   ALKPHOS 79 11/02/2014   AST 16 07/20/2018   AST 21 11/02/2014   ALT 16 07/20/2018   ALT 24 11/02/2014  .   Total Time in preparing paper work, data evaluation and todays exam - 70 minutes  Lala Lund M.D on 07/22/2018 at 8:49 AM  [facility]   Office  [phone]

## 2018-07-22 NOTE — Progress Notes (Signed)
MD notified of patient and family being concerned with him being D/C today. MD spoke with family, family was worried about how the patient would do after IVIG and felt he should be monitored overnight, D/C will be tomorrow.

## 2018-07-23 DIAGNOSIS — G7001 Myasthenia gravis with (acute) exacerbation: Principal | ICD-10-CM

## 2018-07-23 MED ORDER — ENSURE ENLIVE PO LIQD: 237.0000 mL | Freq: Three times a day (TID) | ORAL | 60 refills | Status: DC

## 2018-07-23 MED ORDER — METOPROLOL TARTRATE 50 MG PO TABS: 50.0000 mg | ORAL_TABLET | Freq: Two times a day (BID) | ORAL | 0 refills | Status: DC

## 2018-07-23 NOTE — Progress Notes (Signed)
Subjective: Significantly improved. States that his swallowing is now at about 90% of baseline, while on admission it was at "0%".  Objective: Current vital signs: BP 129/81 (BP Location: Left Arm)   Pulse 66   Temp 97.9 F (36.6 C) (Oral)   Resp 19   Ht 6' (1.829 m)   Wt 88.1 kg   SpO2 97%   BMI 26.34 kg/m  Vital signs in last 24 hours: Temp:  [97.6 F (36.4 C)-98.4 F (36.9 C)] 97.9 F (36.6 C) (09/25 0529) Pulse Rate:  [65-81] 66 (09/25 0529) Resp:  [16-20] 19 (09/25 0529) BP: (119-144)/(75-85) 129/81 (09/25 0529) SpO2:  [89 %-97 %] 97 % (09/25 0821)  Intake/Output from previous day: 09/24 0701 - 09/25 0700 In: 470 [P.O.:470] Out: -  Intake/Output this shift: No intake/output data recorded. Nutritional status:  Diet Order            Diet full liquid Room service appropriate? Yes; Fluid consistency: Thin  Diet effective now             HEENT: [place]/AT Lungs: Respirations unlabored  Neurologic Exam: Ment: Intact to complex questions and commands. No aphasia or dysarthria.  CN: Can bury sclerae on far right and left horizontal gaze. 3 beats nystagmus which then resolve on gaze to right. No double vision elicited. Vertical gaze intact. Left ptosis noted. Face symmetric.  Motor: 5/5 bilateral deltoids, biceps and grip.  4+/5 hip flexion bilaterally. 5/5 right knee extension, 4+/5 left knee extension Sensory: Intact to temp BUE Cerebellar: No ataxia noted.   Lab Results: Results for orders placed or performed during the hospital encounter of 07/17/18 (from the past 48 hour(s))  CBC     Status: None   Collection Time: 07/22/18  5:03 AM  Result Value Ref Range   WBC 6.2 4.0 - 10.5 K/uL   RBC 4.60 4.22 - 5.81 MIL/uL   Hemoglobin 13.3 13.0 - 17.0 g/dL   HCT 41.4 39.0 - 52.0 %   MCV 90.0 78.0 - 100.0 fL   MCH 28.9 26.0 - 34.0 pg   MCHC 32.1 30.0 - 36.0 g/dL   RDW 13.5 11.5 - 15.5 %   Platelets 196 150 - 400 K/uL    Comment: Performed at [facility] [address] [place], [place] [address]  Basic metabolic panel     Status: Abnormal   Collection Time: 07/22/18  5:03 AM  Result Value Ref Range   Sodium 140 135 - 145 mmol/L   Potassium 3.5 3.5 - 5.1 mmol/L   Chloride 103 98 - 111 mmol/L   CO2 30 22 - 32 mmol/L   Glucose, Bld 93 70 - 99 mg/dL   BUN 15 8 - 23 mg/dL   Creatinine, Ser 0.83 0.61 - 1.24 mg/dL   Calcium 8.6 (L) 8.9 - 10.3 mg/dL   GFR calc non Af Amer >60 >60 mL/min   GFR calc Af Amer >60 >60 mL/min    Comment: (NOTE) The eGFR has been calculated using the CKD EPI equation. This calculation has not been validated in all clinical situations. eGFR's persistently <60 mL/min signify possible Chronic Kidney Disease.    Anion gap 7 5 - 15    Comment: Performed at [facility] [address], [place], [place] [address]  Magnesium     Status: None   Collection Time: 07/22/18  5:03 AM  Result Value Ref Range   Magnesium 2.2 1.7 - 2.4 mg/dL    Comment: Performed  at [facility], [place] [address] [place], [place] [address]    Recent Results (from the past 240 hour(s))  MRSA PCR Screening     Status: None   Collection Time: 07/18/18  4:13 AM  Result Value Ref Range Status   MRSA by PCR NEGATIVE NEGATIVE Final    Comment:        The GeneXpert MRSA Assay (FDA approved for NASAL specimens only), is one component of a comprehensive MRSA colonization surveillance program. It is not intended to diagnose MRSA infection nor to guide or monitor treatment for MRSA infections. Performed at [facility], [place] [address] [place], [place] [address]     Lipid Panel No results for input(s): CHOL, TRIG, HDL, CHOLHDL, VLDL, LDLCALC in the last 72 hours.  Studies/Results: No results found.  Medications:  Scheduled: . aspirin  325 mg Oral Daily  . atorvastatin  10 mg Oral q1800  . doxazosin  8 mg Oral Daily  . enoxaparin (LOVENOX) injection  40 mg Subcutaneous Q24H  . feeding supplement (ENSURE ENLIVE)  237 mL  Oral TID BM  . finasteride  5 mg Oral Daily  . metoprolol tartrate  50 mg Per Tube BID  . predniSONE  40 mg Oral Q breakfast  . pyridostigmine  30 mg Oral 3 times per day  . vitamin B-12  1,000 mcg Oral Daily    Assessment: 77 year old male presenting with myasthenia gravis exacerbation 1. Significantly improved after a 5 day course of IVIG. Still with left ptosis and mild lower extremity weakness. Stable for discharge home from a neurological standpoint.  2. On atorvastatin. Benefits of this medication for stroke and MI prevention are felt to be outweighed by risks including statin induced myopathy with possible respiratory failure, given that he has myasthenia gravis. I have recommended that this medication be discontinued.   Recommendations: 1. Stable for discharge home from a neurological standpoint.  2. Discharge atorvastatin 3. Continue prednisone and mestinon 4. Outpatient follow up with Dr. [name] 5. I have provided education to the patient and his family regarding mechanisms of action of prednisone and mestinon with regard to his myasthenia gravis.    LOS: 6 days   @Electronically  signed: Dr. Kerney Elbe 07/23/2018  11:17 AM

## 2018-07-23 NOTE — Telephone Encounter (Signed)
I called patient's wife and he is already home.  Informed them of the appointment on Friday.

## 2018-07-23 NOTE — Progress Notes (Signed)
[name] to be D/C'd Home per MD order.  Discussed with the patient and all questions fully answered.  VSS, Skin clean, dry and intact without evidence of skin break down, no evidence of skin tears noted. IV catheter discontinued intact. Site without signs and symptoms of complications. Dressing and pressure applied.  An After Visit Summary was printed and given to the patient. Patient received prescription.  D/c education completed with patient/family including follow up instructions, medication list, d/c activities limitations if indicated, with other d/c instructions as indicated by MD - patient able to verbalize understanding, all questions fully answered.   Patient instructed to return to ED, call 911, or call MD for any changes in condition.   Patient escorted via [place], and D/C home via private auto.  [name] 07/23/2018 11:42 AM

## 2018-07-23 NOTE — Progress Notes (Signed)
RT NOTES: NIF -35  VC 2.5L Good patient effort.

## 2018-07-23 NOTE — Progress Notes (Signed)
Patient was seen and examined-he has no major complaints.  Claims his ptosis has completely resolved.  He has tolerated a diet.  He has a nonfocal exam.  He is stable for discharge today.  Please see discharge summary done by Dr. Candiss Norse yesterday for further details.

## 2018-07-23 NOTE — Progress Notes (Signed)
  Speech Language Pathology Treatment: Dysphagia  Patient Details Name: [name] MRN: [id number] DOB: 06/07/1941 Today's Date: 07/23/2018 Time: 0300-9233 SLP Time Calculation (min) (ACUTE ONLY): 15 min  Assessment / Plan / Recommendation Clinical Impression  Pt preparing for D/C home. He asserts swallow is much improved.  Observed eating graham crackers, drinking water.  He continues to observe residue of solids in throat, but states it is mild.  One occasion of coughing observed.  Pt is attentive to eating; he does not appear to be aspirating; CN function is much improved.  We discussed beginning soft solids at home - eating slowly and cautiously and using best judgment.  Recommended avoiding tough meats/breads and foods with particulate matter for now (e.g, grits, which were problematic for him yesterday, rice, etc.)  Pt and wife had appropriate questions; verbalized understanding.  No further SLP f/u is needed.    HPI HPI: [name] is an 77 y.o. male with recently diagnosed seropositive MG presents to the ER after being sent by his Neurologist Dr. Narda Amber for progressive dysphagia. Patient has been progressively having difficulty with swallowing, however over the last 2 days has gotten much worse and has been barely able to swallow. Initially presented in August with left eyelid ptosis and subsequently diagnosed MG. 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 CT chest negative for thymoma. Currently on 40 mg Prednisone daily and Mestinon 30 mg TID.       SLP Plan  All goals met       Recommendations  Diet recommendations: Dysphagia 3 (mechanical soft);Thin liquid Liquids provided via: Cup;No straw Medication Administration: Crushed with puree Supervision: Patient able to self feed Compensations: Slow rate;Small sips/bites Postural Changes and/or Swallow Maneuvers: Seated upright 90 degrees                Oral Care Recommendations: Oral care BID Follow up Recommendations: None Plan: All  goals met       GO                [name] 07/23/2018, 11:13 AM

## 2018-07-23 NOTE — Telephone Encounter (Signed)
Please let patient know that he can be discharged once IVIG therapy is completed.  Let's add him to my schedule this Friday at 3:30p for follow-up visit.  Cherylann Hobday K. [name], DO

## 2018-07-24 ENCOUNTER — Ambulatory Visit: Payer: Medicare Other | Admitting: Neurology

## 2018-07-24 ENCOUNTER — Telehealth: Payer: Self-pay | Admitting: *Deleted

## 2018-07-24 ENCOUNTER — Encounter: Payer: Self-pay | Admitting: *Deleted

## 2018-07-24 NOTE — Telephone Encounter (Signed)
Transition Care Management Follow-up Telephone Call    Date discharged? 07-23-18  How have you been since you were released from the hospital? Doing pretty good.  Any patient concerns?  Questions about new medications.    Items Reviewed:  Medications reviewed: yes   Allergies reviewed: yes  Dietary changes reviewed: yes  Referrals reviewed: yes   Functional Questionnaire:  Independent - I Dependent - D    Activities of Daily Living (ADLs):    Personal hygiene - I Dressing - I Eating - I Maintaining continence - I Transferring - I  Independent Activities of Daily Living (iADLs): Basic communication skills - I Transportation - D Meal preparation  - I Shopping - I Housework - I Managing medications - I Managing personal finances - I   Confirmed importance and date/time of follow-up visits scheduled yES  Provider Appointment booked with Dr. [name] 07-25-18 at 3:30  Confirmed with patient if condition begins to worsen call PCP or go to the ER.  Patient was given the office number and encouraged to call back with question or concerns: Yes

## 2018-07-25 ENCOUNTER — Encounter: Payer: Self-pay | Admitting: Neurology

## 2018-07-25 ENCOUNTER — Ambulatory Visit (INDEPENDENT_AMBULATORY_CARE_PROVIDER_SITE_OTHER): Payer: Medicare Other | Admitting: Neurology

## 2018-07-25 VITALS — BP 100/70 | HR 85 | Ht 72.0 in | Wt 192.2 lb

## 2018-07-25 DIAGNOSIS — G7001 Myasthenia gravis with (acute) exacerbation: Secondary | ICD-10-CM

## 2018-07-25 DIAGNOSIS — G7000 Myasthenia gravis without (acute) exacerbation: Secondary | ICD-10-CM | POA: Diagnosis not present

## 2018-07-25 DIAGNOSIS — Z79899 Other long term (current) drug therapy: Secondary | ICD-10-CM

## 2018-07-25 DIAGNOSIS — E778 Other disorders of glycoprotein metabolism: Secondary | ICD-10-CM | POA: Diagnosis not present

## 2018-07-25 MED ORDER — PYRIDOSTIGMINE BROMIDE 60 MG PO TABS: 30.0000 mg | ORAL_TABLET | Freq: Three times a day (TID) | ORAL | 3 refills | Status: DC

## 2018-07-25 MED ORDER — ASPIRIN EC 81 MG PO TBEC: 81.0000 mg | DELAYED_RELEASE_TABLET | Freq: Every day | ORAL | Status: DC

## 2018-07-25 MED ORDER — PREDNISONE 10 MG PO TABS: 10.0000 mg | ORAL_TABLET | Freq: Two times a day (BID) | ORAL | Status: DC

## 2018-07-25 NOTE — Progress Notes (Signed)
Follow-up Visit   Date: 07/25/18    Ahmaud Duthie MRN: [id number] DOB: 04/07/1941   Interim History: Bladimir Auman is a 77 y.o. right-handed Caucasian male with hyperlipidemia, COPD, peripheral neuropathy, SVT s/p ablation, BPH, former smoker, and seropositive myasthenia gravis returning to the clinic for follow-up of transition care management.  The patient was accompanied to the clinic by wife who also provides collateral information.    History of present illness: In mid-August, his wife noticed that his left eye started to appear droopy and gradually got worse.  Over the next few days, he also noticed that his lower lip felt "loose".  In the morning, his eye is usually open, but as the day progresses, the droopiness get worse to where it is completely shut.  He also has double vision with images on top of each other and is worse with upgaze.  If he closes one eye, double vision is resolved. 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  He then went to the ER where stroke was excluded.  He was seen by Dr. Amie Portland and due to positive ice pack test, AChR antibodies were checked and pending at discharge.  He was started on mestinon 30mg  three times daily (8am, 4pm, and midnight) and has not noticed much change.  His AChR binding antibodies have resulted positive and he is here to establish care for myasthenia.   He denies difficulty swallowing, talking, or shortness of breath.  He denies weakness of the arms or legs.    He also complains of severe neck pain which is worse with prolonged standing and neck extension.  He usually has to sit back down and rest for relief. He does not have numbness/tingling of the hands or weakness.   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  CSF testing was normal and genetic testing was normal.     UPDATE 07/25/2018:  About a week after his last visit, he called with worsening ptosis, difficulty swallowing, and slurred speech and was instructed to go to the ER.  He was admitted for MG exacerbation from 9/19 - 9/25 and was treated with IVIG. Dysphagia was so severe that NG tube was placed for feeds and medications.  He responded very nicely to IVIG which has significantly helped his double vision, ptosis, and difficulty swallowing.  He has only trace facial weakness.  He was instructed to maintain liquid diet so he is drinking ensure, but feels that he can eat softer foods now.   He continues to take prednisone 20mg  daily and mestinon 30mg  three times daily.  He has many questions regarding discharge medications as aspirin was increased to 325mg , lipitor was stopped, and metoprolol was added.   Medications:  Current Outpatient Medications on File Prior to Visit  Medication Sig Dispense Refill  . aspirin EC 325 MG EC tablet Take 1 tablet (325 mg total) by mouth daily. 30 tablet 0  . Calcium Carbonate-Vitamin D (CALCIUM 600+D) 600-400 MG-UNIT per tablet Take 1 tablet by mouth daily.      . Cyanocobalamin (VITAMIN B-12 PO) Take 1 tablet by mouth daily.    Marland Kitchen doxazosin (CARDURA) 8 MG tablet Take 8 mg by mouth daily.    . feeding supplement, ENSURE ENLIVE, (ENSURE ENLIVE) LIQD Take 237 mLs by mouth 3 (three) times daily between meals. 60 Bottle 60  . finasteride (PROSCAR) 5 MG tablet  Take 5 mg by mouth daily.   2  . Glucosamine HCl 1500 MG TABS Take 1,500 mg by mouth daily.    Marland Kitchen ibuprofen (ADVIL,MOTRIN) 200 MG tablet Take 200 mg by mouth every 6 (six) hours as needed for headache (pain).    . Multiple Vitamin (MULTIVITAMIN WITH MINERALS) TABS tablet Take 1 tablet by mouth daily.    . predniSONE (DELTASONE) 20 MG tablet Take 1 tablet (20 mg total) by mouth 2 (two) times daily with a meal. 90 tablet 3  . Psyllium (METAMUCIL PO) Take 15 mLs by mouth daily. Mix in 8 oz water and drink    .  pyridostigmine (MESTINON) 60 MG tablet Take half tablet at 8am, 1pm, and 6pm (Patient taking differently: Take 30 mg by mouth 3 (three) times daily. Take half tablet at 8am, 1pm, and 6pm) 90 tablet 5  . atorvastatin (LIPITOR) 10 MG tablet Take 1 tablet (10 mg total) by mouth daily at 6 PM. (Patient not taking: Reported on 07/25/2018) 30 tablet 5  . metoprolol tartrate (LOPRESSOR) 50 MG tablet Take 1 tablet (50 mg total) by mouth 2 (two) times daily. (Patient not taking: Reported on 07/25/2018) 60 tablet 0   No current facility-administered medications on file prior to visit.     Allergies:  Allergies  Allergen Reactions  . Pollen Extract Other (See Comments)    Nasal congestion    Review of Systems:  CONSTITUTIONAL: No fevers, chills, night sweats, or weight loss.  EYES: No visual changes or eye pain ENT: No hearing changes.  No history of nose bleeds.   RESPIRATORY: No cough, wheezing and shortness of breath.   CARDIOVASCULAR: Negative for chest pain, and palpitations.   GI: Negative for abdominal discomfort, blood in stools or black stools.  No recent change in bowel habits.   GU:  No history of incontinence.   MUSCLOSKELETAL: No history of joint pain or swelling.  No myalgias.   SKIN: Negative for lesions, rash, and itching.   ENDOCRINE: Negative for cold or heat intolerance, polydipsia or goiter.   PSYCH:  No depression or anxiety symptoms.   NEURO: As Above.   Vital Signs:  BP 100/70   Pulse 85   Ht 6' (1.829 m)   Wt 192 lb 4 oz (87.2 kg)   SpO2 93%   BMI 26.07 kg/m   General Medical Exam:   General:  Well appearing, comfortable  Eyes/ENT: see cranial nerve examination.   Neck: No masses appreciated.  Full range of motion without tenderness.  No carotid bruits. Respiratory:  Clear to auscultation, good air entry bilaterally.   Cardiac:  Regular rate and rhythm, no murmur.   Ext:  No edema  Neurological Exam: MENTAL STATUS including orientation to time, place, person,  recent and remote memory, attention span and concentration, language, and fund of knowledge is normal.  Speech is not dysarthric.  CRANIAL NERVES: No visual field defects.  Pupils equal round and reactive to light.  Normal conjugate, extra-ocular eye movements in all directions of gaze.  Mild left ptosis, without worsening with sustained upgaze.  Face is symmetric.  Orbicularis oculi, buccinator is 5/5, there is trace weakness with orbicularis oris. Palate elevates symmetrically.  Tongue is midline, strength is 5/5 and side-to-side movements intact.  MOTOR:  Neck flexion is 5/5. Motor strength is 5/5 in all extremities.  No atrophy, fasciculations or abnormal movements.  No pronator drift.  Tone is normal.    MSRs:  Reflexes are 2+/4 throughout  SENSORY:  Intact to vibration throughout.  COORDINATION/GAIT:  He can easily stand up from chair without pushing off.  Gait narrow based and stable.   Data: LAbs 06/15/2018:  AChR binding 1.69*, MUSK neg  MRI brain wo contrast 06/14/2018: Negative for acute infarct. 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  CTA head and neck 06/14/2018: 1. Negative for large vessel occlusion, dissection, or arterial stenosis in the head or neck. There is minimal atherosclerosis. 2. Positive for generalized arterial tortuosity. Ectatic distal aortic arch and proximal descending aorta. 3. A small 15 mm area of ground-glass opacity in the right upper lobe appears stable since 2015. 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  NCS/EMG of the legs 03/13/2018:  This is an abnormal study. There is electrophysiologic evidence oflength-dependent, moderately-severe, axonal sensory-motor polyneuropathy.  CT chest 07/11/2018:  Negative for thymoma  MRI cervical spine wo contrast 07/14/2018: IMPRESSION: 1. No acute finding. 2. Disc and facet degeneration causes foraminal impingement that is  advanced on the left at c3-4 and on the left at C5-6. 3. Mild right foraminal narrowing at C5-6 and C6-7.  IMPRESSION/PLAN: 1.  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  He was hospitalized from 9/19 - 9/25 for IVIG and responded very well.  He has mild left ptosis and trace weakness of orbicularis oris, otherwise has 5/5 strength of facial and limb muscles.  He has lost significant weight due to dysphagia and hopefully, now that bulbar muscle are stronger, can slowly start to introduce softer textured foods and gradually work his way back to normal textures.  He will be getting home speech/swallow evaluation  - Continue prednisone 20mg  daily  - Continue mestinon 30mg  three times daily  - Start PA for IVIG in the event this needs to be used as maintenance therapy for 1mg /kg monthly every 4 weeks  - If he develops any new weakness, low threshold to increase prednisone to 40mg  daily and start monthly IVIG  2.  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  He does not have radicular pain into the arms and complains moreso of cervicalgia.  He will be starting PT at home and have asked patient to discussed ROM exercises with the therapist for his neck  3.  Regarding his medication changes which were made in the hospital.  I recommend stopping metoprolol, as BP and HR is normal and it can exacerbate MG. Transition to aspirin 81mg  daily, instead of 325mg  (not clear why this was increased) and he can resume home dose of lipitor 10mg  daily, as risk of myopathy is low.   Return to clinic in 2 weeks  Thank you for allowing me to participate in patient's care.  If I can answer any additional questions, I would be pleased to do so.    Sincerely,    Mistey Hoffert K. [name], DO

## 2018-07-25 NOTE — Patient Instructions (Signed)
Recommend stopping metoprolol.  Transition to aspirin 81mg  daily, instead of 325mg .  Restart lipitor 10mg  daily  Continue prednisone 20mg  daily and mestinon 30mg  three times daily  We will start prior authorization for IVIG, but do not request shipment until further instruction

## 2018-07-28 DIAGNOSIS — N400 Enlarged prostate without lower urinary tract symptoms: Secondary | ICD-10-CM | POA: Diagnosis not present

## 2018-07-28 DIAGNOSIS — R1310 Dysphagia, unspecified: Secondary | ICD-10-CM | POA: Diagnosis not present

## 2018-07-28 DIAGNOSIS — E440 Moderate protein-calorie malnutrition: Secondary | ICD-10-CM | POA: Diagnosis not present

## 2018-07-28 DIAGNOSIS — Z7982 Long term (current) use of aspirin: Secondary | ICD-10-CM | POA: Diagnosis not present

## 2018-07-28 DIAGNOSIS — I10 Essential (primary) hypertension: Secondary | ICD-10-CM | POA: Diagnosis not present

## 2018-07-28 DIAGNOSIS — E785 Hyperlipidemia, unspecified: Secondary | ICD-10-CM | POA: Diagnosis not present

## 2018-07-28 DIAGNOSIS — G7001 Myasthenia gravis with (acute) exacerbation: Secondary | ICD-10-CM | POA: Diagnosis not present

## 2018-07-29 DIAGNOSIS — G7001 Myasthenia gravis with (acute) exacerbation: Secondary | ICD-10-CM | POA: Diagnosis not present

## 2018-07-29 DIAGNOSIS — I10 Essential (primary) hypertension: Secondary | ICD-10-CM | POA: Diagnosis not present

## 2018-07-29 DIAGNOSIS — N400 Enlarged prostate without lower urinary tract symptoms: Secondary | ICD-10-CM | POA: Diagnosis not present

## 2018-07-29 DIAGNOSIS — E785 Hyperlipidemia, unspecified: Secondary | ICD-10-CM | POA: Diagnosis not present

## 2018-07-29 DIAGNOSIS — R1310 Dysphagia, unspecified: Secondary | ICD-10-CM | POA: Diagnosis not present

## 2018-07-29 DIAGNOSIS — E440 Moderate protein-calorie malnutrition: Secondary | ICD-10-CM | POA: Diagnosis not present

## 2018-07-30 ENCOUNTER — Telehealth: Payer: Self-pay | Admitting: Neurology

## 2018-07-30 DIAGNOSIS — R1310 Dysphagia, unspecified: Secondary | ICD-10-CM | POA: Diagnosis not present

## 2018-07-30 DIAGNOSIS — E785 Hyperlipidemia, unspecified: Secondary | ICD-10-CM | POA: Diagnosis not present

## 2018-07-30 DIAGNOSIS — N400 Enlarged prostate without lower urinary tract symptoms: Secondary | ICD-10-CM | POA: Diagnosis not present

## 2018-07-30 DIAGNOSIS — I10 Essential (primary) hypertension: Secondary | ICD-10-CM | POA: Diagnosis not present

## 2018-07-30 DIAGNOSIS — G7001 Myasthenia gravis with (acute) exacerbation: Secondary | ICD-10-CM | POA: Diagnosis not present

## 2018-07-30 DIAGNOSIS — E440 Moderate protein-calorie malnutrition: Secondary | ICD-10-CM | POA: Diagnosis not present

## 2018-07-30 NOTE — Telephone Encounter (Signed)
Ebony Hail the Speech Path called leaving a vm wanting to get verbal orders to address the swallow function. Please call her back at [phone]. Thanks!

## 2018-07-31 DIAGNOSIS — E440 Moderate protein-calorie malnutrition: Secondary | ICD-10-CM | POA: Diagnosis not present

## 2018-07-31 DIAGNOSIS — R1310 Dysphagia, unspecified: Secondary | ICD-10-CM | POA: Diagnosis not present

## 2018-07-31 DIAGNOSIS — I10 Essential (primary) hypertension: Secondary | ICD-10-CM | POA: Diagnosis not present

## 2018-07-31 DIAGNOSIS — G7001 Myasthenia gravis with (acute) exacerbation: Secondary | ICD-10-CM | POA: Diagnosis not present

## 2018-07-31 DIAGNOSIS — E785 Hyperlipidemia, unspecified: Secondary | ICD-10-CM | POA: Diagnosis not present

## 2018-07-31 DIAGNOSIS — N400 Enlarged prostate without lower urinary tract symptoms: Secondary | ICD-10-CM | POA: Diagnosis not present

## 2018-07-31 NOTE — Telephone Encounter (Signed)
Left message on machine for Ebony Hail to call back.

## 2018-07-31 NOTE — Telephone Encounter (Signed)
Spoke with [name] and gave verbal order for speech therapy.

## 2018-08-05 DIAGNOSIS — I10 Essential (primary) hypertension: Secondary | ICD-10-CM | POA: Diagnosis not present

## 2018-08-05 DIAGNOSIS — G7001 Myasthenia gravis with (acute) exacerbation: Secondary | ICD-10-CM | POA: Diagnosis not present

## 2018-08-05 DIAGNOSIS — E440 Moderate protein-calorie malnutrition: Secondary | ICD-10-CM | POA: Diagnosis not present

## 2018-08-05 DIAGNOSIS — R1310 Dysphagia, unspecified: Secondary | ICD-10-CM | POA: Diagnosis not present

## 2018-08-05 DIAGNOSIS — E785 Hyperlipidemia, unspecified: Secondary | ICD-10-CM | POA: Diagnosis not present

## 2018-08-05 DIAGNOSIS — N400 Enlarged prostate without lower urinary tract symptoms: Secondary | ICD-10-CM | POA: Diagnosis not present

## 2018-08-06 ENCOUNTER — Other Ambulatory Visit: Payer: Medicare Other

## 2018-08-06 ENCOUNTER — Encounter: Payer: Self-pay | Admitting: Neurology

## 2018-08-06 ENCOUNTER — Ambulatory Visit (INDEPENDENT_AMBULATORY_CARE_PROVIDER_SITE_OTHER): Payer: Medicare Other | Admitting: Neurology

## 2018-08-06 VITALS — BP 120/70 | HR 82 | Ht 72.0 in | Wt 192.2 lb

## 2018-08-06 DIAGNOSIS — G44209 Tension-type headache, unspecified, not intractable: Secondary | ICD-10-CM | POA: Diagnosis not present

## 2018-08-06 DIAGNOSIS — G7001 Myasthenia gravis with (acute) exacerbation: Secondary | ICD-10-CM

## 2018-08-06 DIAGNOSIS — Z79899 Other long term (current) drug therapy: Secondary | ICD-10-CM | POA: Diagnosis not present

## 2018-08-06 MED ORDER — PREDNISONE 10 MG PO TABS: 10.0000 mg | ORAL_TABLET | Freq: Two times a day (BID) | ORAL | 3 refills | Status: DC

## 2018-08-06 NOTE — Progress Notes (Signed)
Follow-up Visit   Date: 08/06/18    [name] MRN: [id number] DOB: 1941/01/17   Interim History: [name] is a 77 y.o. right-handed Caucasian male with hyperlipidemia, COPD, peripheral neuropathy, SVT s/p ablation, BPH, former smoker, and seropositive myasthenia gravis returning to the clinic for follow-up of myasthenia gravis.  The patient was accompanied to the clinic by wife who also provides collateral information.    History of present illness: In mid-August, his wife noticed that his left eye started to appear droopy and gradually got worse.  Over the next few days, he also noticed that his lower lip felt "loose".  In the morning, his eye is usually open, but as the day progresses, the droopiness get worse to where it is completely shut.  He also has double vision with images on top of each other and is worse with upgaze.  If he closes one eye, double vision is resolved. 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  He then went to the ER where stroke was excluded.  He was seen by Dr. Amie Portland and due to positive ice pack test, AChR antibodies were checked and pending at discharge.  He was started on mestinon 30mg  three times daily (8am, 4pm, and midnight) and has not noticed much change.  His AChR binding antibodies have resulted positive and he is here to establish care for myasthenia.   He denies difficulty swallowing, talking, or shortness of breath.  He denies weakness of the arms or legs.    He also complains of severe neck pain which is worse with prolonged standing and neck extension.  He usually has to sit back down and rest for relief. He does not have numbness/tingling of the hands or weakness.   He has previously been evaluated for neuropathy at [facility] with NCS/EMG which showed peripheral sensorimotor axonal neuropathy.  CSF testing was normal and genetic testing was normal.    UPDATE  07/25/2018:  About a week after his last visit, he called with worsening ptosis, difficulty swallowing, and slurred speech and was instructed to go to the ER.  He was admitted for MG exacerbation from 9/19 - 9/25 and was treated with IVIG. Dysphagia was so severe that NG tube was placed for feeds and medications.  He responded very nicely to IVIG which has significantly helped his double vision, ptosis, and difficulty swallowing.  He has only trace facial weakness.  He was instructed to maintain liquid diet so he is drinking ensure, but feels that he can eat softer foods now.   He continues to take prednisone 20mg  daily and mestinon 30mg  three times daily.  UPDATE 08/06/2018:  He is here for two week follow-up visit.  Overall, he is doing well. He is able to eat better but still has fluctuating weakness with swallowing harder textured foods, especially in the morning.  He also has feeling of "looseness" around the lips.  No slurred speech, droopiness of the eyes, double vision, or limb weakness.  He remains on prednisone 20mg /d and mestinon 30mg  TID.  PA for IVIG is in process. He also complains of increased congestion at nighttime and dull headache at night.  Medications:  Current Outpatient Medications on File Prior to Visit  Medication Sig Dispense Refill  . aspirin 81 MG tablet Take 1 tablet (81 mg total) by mouth daily.    Marland Kitchen atorvastatin (LIPITOR) 10 MG tablet Take 1 tablet (10 mg total) by mouth  daily at 6 PM. (Patient not taking: Reported on 07/25/2018) 30 tablet 5  . Calcium Carbonate-Vitamin D (CALCIUM 600+D) 600-400 MG-UNIT per tablet Take 1 tablet by mouth daily.      . Cyanocobalamin (VITAMIN B-12 PO) Take 1 tablet by mouth daily.    Marland Kitchen doxazosin (CARDURA) 8 MG tablet Take 8 mg by mouth daily.    . feeding supplement, ENSURE ENLIVE, (ENSURE ENLIVE) LIQD Take 237 mLs by mouth 3 (three) times daily between meals. 60 Bottle 60  . finasteride (PROSCAR) 5 MG tablet Take 5 mg by mouth daily.   2  .  Glucosamine HCl 1500 MG TABS Take 1,500 mg by mouth daily.    Marland Kitchen ibuprofen (ADVIL,MOTRIN) 200 MG tablet Take 200 mg by mouth every 6 (six) hours as needed for headache (pain).    . Multiple Vitamin (MULTIVITAMIN WITH MINERALS) TABS tablet Take 1 tablet by mouth daily.    . predniSONE (DELTASONE) 10 MG tablet Take 1 tablet (10 mg total) by mouth 2 (two) times daily with a meal.    . Psyllium (METAMUCIL PO) Take 15 mLs by mouth daily. Mix in 8 oz water and drink    . pyridostigmine (MESTINON) 60 MG tablet Take 0.5 tablets (30 mg total) by mouth 3 (three) times daily. Take half tablet at 8am, 1pm, and 6pm 135 tablet 3   No current facility-administered medications on file prior to visit.     Allergies:  Allergies  Allergen Reactions  . Pollen Extract Other (See Comments)    Nasal congestion    Review of Systems:  CONSTITUTIONAL: No fevers, chills, night sweats, or weight loss.  EYES: No visual changes or eye pain ENT: No hearing changes.  No history of nose bleeds.   RESPIRATORY: No cough, wheezing and shortness of breath.   CARDIOVASCULAR: Negative for chest pain, and palpitations.   GI: Negative for abdominal discomfort, blood in stools or black stools.  No recent change in bowel habits.   GU:  No history of incontinence.   MUSCLOSKELETAL: No history of joint pain or swelling.  No myalgias.   SKIN: Negative for lesions, rash, and itching.   ENDOCRINE: Negative for cold or heat intolerance, polydipsia or goiter.   PSYCH:  No depression or anxiety symptoms.   NEURO: As Above.   Vital Signs:  BP 120/70   Pulse 82   Ht 6' (1.829 m)   Wt 192 lb 4 oz (87.2 kg)   SpO2 95%   BMI 26.07 kg/m   General Medical Exam:   General:  Well appearing, comfortable  Eyes/ENT: see cranial nerve examination.   Neck: No carotid bruits. Respiratory:  Clear to auscultation, good air entry bilaterally.   Cardiac:  Regular rate and rhythm, no murmur.   Ext:  No edema  Neurological Exam: MENTAL  STATUS including orientation to time, place, person, recent and remote memory, attention span and concentration, language, and fund of knowledge is normal.  Speech is not dysarthric.  CRANIAL NERVES: No visual field defects.  Pupils equal round and reactive to light.  Normal conjugate, extra-ocular eye movements in all directions of gaze.  Mild left ptosis, without worsening with sustained upgaze.  Face is symmetric.  Orbicularis oculi, buccinator is 5/5, there is trace weakness with orbicularis oris. Palate elevates symmetrically.  Tongue is midline, strength is 5/5 and side-to-side movements intact.  MOTOR:  Motor strength is 5/5 in all extremities, without fatigability.  No atrophy, fasciculations or abnormal movements.  No pronator drift.  Tone is normal.    COORDINATION/GAIT:    Gait narrow based and stable.   Data: LAbs 06/15/2018:  AChR binding 1.69*, MUSK neg  MRI brain wo contrast 06/14/2018: Negative for acute infarct. 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  CTA head and neck 06/14/2018: 1. Negative for large vessel occlusion, dissection, or arterial stenosis in the head or neck. There is minimal atherosclerosis. 2. Positive for generalized arterial tortuosity. Ectatic distal aortic arch and proximal descending aorta. 3. A small 15 mm area of ground-glass opacity in the right upper lobe appears stable since 2015. 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  NCS/EMG of the legs 03/13/2018:  This is an abnormal study. There is electrophysiologic evidence oflength-dependent, moderately-severe, axonal sensory-motor polyneuropathy.  CT chest 07/11/2018:  Negative for thymoma  MRI cervical spine wo contrast 07/14/2018: IMPRESSION: 1. No acute finding. 2. Disc and facet degeneration causes foraminal impingement that is advanced on the left at c3-4 and on the left at C5-6. 3. Mild right foraminal  narrowing at C5-6 and C6-7.  IMPRESSION/PLAN: 1.  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  He was hospitalized from 9/19 - 9/25 for IVIG and responded very well.  Exam remains stable and shows mild left ptosis and trace weakness of orbicularis oris, otherwise has 5/5 strength of facial and limb muscles. He is continuing to have mild dysphagia, so will increase prednisone to 30mg  daily. Start pepcid 20mg  daily and continue calcium supplements.  Because of need for corticosteroid medication, I will check TMPT level as we consider starting a steroid-sparing medication.  Continue mestinon 30mg  three times daily.  PA for IVIG is in process in the event he has exacerbation.  2.  Congestion and headaches may be due to sinusitis.  Supportive care discussed. He was encouraged to get annual influenza vaccination due to compromised immune system  Return to clinic in 4-6 weeks  Thank you for allowing me to participate in patient's care.  If I can answer any additional questions, I would be pleased to do so.    Sincerely,    Donika K. [name], DO

## 2018-08-06 NOTE — Patient Instructions (Addendum)
Increase prednisone to 30mg /d - you can take 20mg  in the morning and 10mg  at bedtime  Start pepcid 20mg  daily  Continue calcium and vitamin D supplements  Check labs  Recommend influenza immunization  Return to clinic on November 20th at 9:30am

## 2018-08-07 DIAGNOSIS — I10 Essential (primary) hypertension: Secondary | ICD-10-CM | POA: Diagnosis not present

## 2018-08-07 DIAGNOSIS — N400 Enlarged prostate without lower urinary tract symptoms: Secondary | ICD-10-CM | POA: Diagnosis not present

## 2018-08-07 DIAGNOSIS — R1310 Dysphagia, unspecified: Secondary | ICD-10-CM | POA: Diagnosis not present

## 2018-08-07 DIAGNOSIS — E785 Hyperlipidemia, unspecified: Secondary | ICD-10-CM | POA: Diagnosis not present

## 2018-08-07 DIAGNOSIS — G7001 Myasthenia gravis with (acute) exacerbation: Secondary | ICD-10-CM | POA: Diagnosis not present

## 2018-08-07 DIAGNOSIS — E440 Moderate protein-calorie malnutrition: Secondary | ICD-10-CM | POA: Diagnosis not present

## 2018-08-13 LAB — THIOPURINE METHYLTRANSFERASE (TPMT), RBC: Thiopurine Methyltransferase, RBC: 13 nmol/hr/mL RBC

## 2018-08-14 DIAGNOSIS — E785 Hyperlipidemia, unspecified: Secondary | ICD-10-CM | POA: Diagnosis not present

## 2018-08-14 DIAGNOSIS — R1310 Dysphagia, unspecified: Secondary | ICD-10-CM | POA: Diagnosis not present

## 2018-08-14 DIAGNOSIS — I10 Essential (primary) hypertension: Secondary | ICD-10-CM | POA: Diagnosis not present

## 2018-08-14 DIAGNOSIS — E440 Moderate protein-calorie malnutrition: Secondary | ICD-10-CM | POA: Diagnosis not present

## 2018-08-14 DIAGNOSIS — N400 Enlarged prostate without lower urinary tract symptoms: Secondary | ICD-10-CM | POA: Diagnosis not present

## 2018-08-14 DIAGNOSIS — G7001 Myasthenia gravis with (acute) exacerbation: Secondary | ICD-10-CM | POA: Diagnosis not present

## 2018-08-16 ENCOUNTER — Emergency Department (HOSPITAL_COMMUNITY): Payer: Medicare Other

## 2018-08-16 ENCOUNTER — Encounter (HOSPITAL_COMMUNITY): Payer: Self-pay

## 2018-08-16 ENCOUNTER — Inpatient Hospital Stay (HOSPITAL_COMMUNITY)
Admission: EM | Admit: 2018-08-16 | Discharge: 2018-08-21 | DRG: 392 | Disposition: A | Discharge status: Home / Self Care | Payer: Medicare Other | Attending: Internal Medicine | Admitting: Internal Medicine

## 2018-08-16 DIAGNOSIS — E785 Hyperlipidemia, unspecified: Secondary | ICD-10-CM | POA: Diagnosis present

## 2018-08-16 DIAGNOSIS — N400 Enlarged prostate without lower urinary tract symptoms: Secondary | ICD-10-CM | POA: Diagnosis not present

## 2018-08-16 DIAGNOSIS — Z87891 Personal history of nicotine dependence: Secondary | ICD-10-CM

## 2018-08-16 DIAGNOSIS — G7000 Myasthenia gravis without (acute) exacerbation: Secondary | ICD-10-CM | POA: Diagnosis present

## 2018-08-16 DIAGNOSIS — K631 Perforation of intestine (nontraumatic): Secondary | ICD-10-CM | POA: Diagnosis not present

## 2018-08-16 DIAGNOSIS — E876 Hypokalemia: Secondary | ICD-10-CM | POA: Diagnosis present

## 2018-08-16 DIAGNOSIS — Z7982 Long term (current) use of aspirin: Secondary | ICD-10-CM

## 2018-08-16 DIAGNOSIS — N401 Enlarged prostate with lower urinary tract symptoms: Secondary | ICD-10-CM | POA: Diagnosis present

## 2018-08-16 DIAGNOSIS — Z7952 Long term (current) use of systemic steroids: Secondary | ICD-10-CM

## 2018-08-16 DIAGNOSIS — A419 Sepsis, unspecified organism: Secondary | ICD-10-CM

## 2018-08-16 DIAGNOSIS — G7001 Myasthenia gravis with (acute) exacerbation: Secondary | ICD-10-CM | POA: Diagnosis present

## 2018-08-16 DIAGNOSIS — K5720 Diverticulitis of large intestine with perforation and abscess without bleeding: Principal | ICD-10-CM | POA: Diagnosis present

## 2018-08-16 DIAGNOSIS — K5780 Diverticulitis of intestine, part unspecified, with perforation and abscess without bleeding: Secondary | ICD-10-CM

## 2018-08-16 DIAGNOSIS — E860 Dehydration: Secondary | ICD-10-CM | POA: Diagnosis not present

## 2018-08-16 DIAGNOSIS — Z87442 Personal history of urinary calculi: Secondary | ICD-10-CM

## 2018-08-16 DIAGNOSIS — Z79899 Other long term (current) drug therapy: Secondary | ICD-10-CM | POA: Diagnosis not present

## 2018-08-16 DIAGNOSIS — E7800 Pure hypercholesterolemia, unspecified: Secondary | ICD-10-CM | POA: Diagnosis not present

## 2018-08-16 DIAGNOSIS — I10 Essential (primary) hypertension: Secondary | ICD-10-CM | POA: Diagnosis present

## 2018-08-16 DIAGNOSIS — R4702 Dysphasia: Secondary | ICD-10-CM | POA: Diagnosis present

## 2018-08-16 DIAGNOSIS — Z91048 Other nonmedicinal substance allergy status: Secondary | ICD-10-CM | POA: Diagnosis not present

## 2018-08-16 DIAGNOSIS — R109 Unspecified abdominal pain: Secondary | ICD-10-CM | POA: Diagnosis not present

## 2018-08-16 DIAGNOSIS — N201 Calculus of ureter: Secondary | ICD-10-CM | POA: Diagnosis not present

## 2018-08-16 DIAGNOSIS — Z8719 Personal history of other diseases of the digestive system: Secondary | ICD-10-CM

## 2018-08-16 DIAGNOSIS — R350 Frequency of micturition: Secondary | ICD-10-CM | POA: Diagnosis present

## 2018-08-16 DIAGNOSIS — R197 Diarrhea, unspecified: Secondary | ICD-10-CM | POA: Diagnosis not present

## 2018-08-16 HISTORY — DX: Diverticulitis of intestine, part unspecified, without perforation or abscess without bleeding: K57.92

## 2018-08-16 LAB — CBC
HCT: 43.1 % (ref 39.0–52.0)
Hemoglobin: 13.7 g/dL (ref 13.0–17.0)
MCH: 29.2 pg (ref 26.0–34.0)
MCHC: 31.8 g/dL (ref 30.0–36.0)
MCV: 91.9 fL (ref 80.0–100.0)
Platelets: 209 10*3/uL (ref 150–400)
RBC: 4.69 MIL/uL (ref 4.22–5.81)
RDW: 15.1 % (ref 11.5–15.5)
WBC: 10.9 10*3/uL — ABNORMAL HIGH (ref 4.0–10.5)
nRBC: 0 % (ref 0.0–0.2)

## 2018-08-16 LAB — URINALYSIS, ROUTINE W REFLEX MICROSCOPIC
Bilirubin Urine: NEGATIVE
Glucose, UA: NEGATIVE mg/dL
Hgb urine dipstick: NEGATIVE
Ketones, ur: NEGATIVE mg/dL
Leukocytes, UA: NEGATIVE
Nitrite: NEGATIVE
Protein, ur: NEGATIVE mg/dL
Specific Gravity, Urine: 1.038 — ABNORMAL HIGH (ref 1.005–1.030)
pH: 9 — ABNORMAL HIGH (ref 5.0–8.0)

## 2018-08-16 LAB — COMPREHENSIVE METABOLIC PANEL
ALT: 18 U/L (ref 0–44)
AST: 16 U/L (ref 15–41)
Albumin: 3 g/dL — ABNORMAL LOW (ref 3.5–5.0)
Alkaline Phosphatase: 54 U/L (ref 38–126)
Anion gap: 8 (ref 5–15)
BUN: 15 mg/dL (ref 8–23)
CO2: 30 mmol/L (ref 22–32)
Calcium: 9 mg/dL (ref 8.9–10.3)
Chloride: 101 mmol/L (ref 98–111)
Creatinine, Ser: 0.91 mg/dL (ref 0.61–1.24)
GFR calc Af Amer: >60 mL/min (ref >60)
GFR calc non Af Amer: >60 mL/min (ref >60)
Glucose, Bld: 121 mg/dL — ABNORMAL HIGH (ref 70–99)
Potassium: 3.3 mmol/L — ABNORMAL LOW (ref 3.5–5.1)
Sodium: 139 mmol/L (ref 135–145)
Total Bilirubin: 1.7 mg/dL — ABNORMAL HIGH (ref 0.3–1.2)
Total Protein: 6.5 g/dL (ref 6.5–8.1)

## 2018-08-16 LAB — LIPASE, BLOOD: Lipase: 30 U/L (ref 11–51)

## 2018-08-16 LAB — I-STAT CG4 LACTIC ACID, ED: Lactic Acid, Venous: 1.34 mmol/L (ref 0.5–1.9)

## 2018-08-16 MED ORDER — PIPERACILLIN-TAZOBACTAM 3.375 G IVPB 30 MIN: 3.3750 g | Freq: Three times a day (TID) | INTRAVENOUS | Status: DC

## 2018-08-16 MED ORDER — MORPHINE SULFATE (PF) 2 MG/ML IV SOLN: 1.0000 mg | INTRAVENOUS | Status: DC | PRN

## 2018-08-16 MED ORDER — SODIUM CHLORIDE 0.9 % IV BOLUS
1000.0000 mL | Freq: Once | INTRAVENOUS | Status: AC
  Administered 2018-08-16: 1000 mL via INTRAVENOUS

## 2018-08-16 MED ORDER — ENOXAPARIN SODIUM 40 MG/0.4ML ~~LOC~~ SOLN
40.0000 mg | SUBCUTANEOUS | Status: DC
  Filled 2018-08-16: qty 0.4

## 2018-08-16 MED ORDER — PIPERACILLIN-TAZOBACTAM 3.375 G IVPB
3.3750 g | Freq: Three times a day (TID) | INTRAVENOUS | Status: DC
  Administered 2018-08-16 – 2018-08-21 (×14): 3.375 g via INTRAVENOUS
  Filled 2018-08-16 (×13): qty 50

## 2018-08-16 MED ORDER — ACETAMINOPHEN 10 MG/ML IV SOLN
1000.0000 mg | Freq: Four times a day (QID) | INTRAVENOUS | Status: AC
  Administered 2018-08-16 – 2018-08-17 (×3): 1000 mg via INTRAVENOUS
  Filled 2018-08-16 (×5): qty 100

## 2018-08-16 MED ORDER — HYDROCORTISONE NA SUCCINATE PF 100 MG IJ SOLR: 50.0000 mg | Freq: Once | INTRAMUSCULAR | Status: DC

## 2018-08-16 MED ORDER — ONDANSETRON HCL 4 MG/2ML IJ SOLN: 4.0000 mg | Freq: Four times a day (QID) | INTRAMUSCULAR | Status: DC | PRN

## 2018-08-16 MED ORDER — METHYLPREDNISOLONE SODIUM SUCC 40 MG IJ SOLR
16.0000 mg | Freq: Every day | INTRAMUSCULAR | Status: DC
  Administered 2018-08-16 – 2018-08-17 (×2): 16 mg via INTRAVENOUS
  Filled 2018-08-16: qty 1

## 2018-08-16 MED ORDER — METHYLPREDNISOLONE SODIUM SUCC 40 MG IJ SOLR
8.0000 mg | Freq: Every day | INTRAMUSCULAR | Status: DC
  Administered 2018-08-16: 8 mg via INTRAVENOUS
  Filled 2018-08-16 (×2): qty 1

## 2018-08-16 MED ORDER — PYRIDOSTIGMINE BROMIDE 10 MG/2ML IV SOLN
1.0000 mg | Freq: Three times a day (TID) | INTRAVENOUS | Status: DC
  Administered 2018-08-16 – 2018-08-21 (×15): 1 mg via INTRAVENOUS
  Filled 2018-08-16 (×16): qty 0.2

## 2018-08-16 MED ORDER — IOHEXOL 300 MG/ML  SOLN
100.0000 mL | Freq: Once | INTRAMUSCULAR | Status: AC | PRN
  Administered 2018-08-16: 100 mL via INTRAVENOUS

## 2018-08-16 MED ORDER — HYDROCORTISONE NA SUCCINATE PF 100 MG IJ SOLR
100.0000 mg | Freq: Once | INTRAMUSCULAR | Status: AC
  Administered 2018-08-16: 100 mg via INTRAVENOUS
  Filled 2018-08-16: qty 2

## 2018-08-16 MED ORDER — MORPHINE BOLUS VIA INFUSION
1.0000 mg | INTRAVENOUS | Status: DC | PRN
  Filled 2018-08-16: qty 2

## 2018-08-16 MED ORDER — LACTATED RINGERS IV SOLN
INTRAVENOUS | Status: DC
  Administered 2018-08-16 – 2018-08-20 (×8): via INTRAVENOUS

## 2018-08-16 MED ORDER — PYRIDOSTIGMINE BROMIDE 60 MG PO TABS
30.0000 mg | ORAL_TABLET | Freq: Once | ORAL | Status: AC
  Administered 2018-08-16: 30 mg via ORAL
  Filled 2018-08-16: qty 0.5

## 2018-08-16 MED ORDER — PIPERACILLIN-TAZOBACTAM 3.375 G IVPB 30 MIN
3.3750 g | Freq: Once | INTRAVENOUS | Status: AC
  Administered 2018-08-16: 3.375 g via INTRAVENOUS
  Filled 2018-08-16: qty 50

## 2018-08-16 MED ORDER — POTASSIUM CHLORIDE 10 MEQ/100ML IV SOLN
10.0000 meq | INTRAVENOUS | Status: AC
  Administered 2018-08-16 (×3): 10 meq via INTRAVENOUS
  Filled 2018-08-16 (×3): qty 100

## 2018-08-16 NOTE — ED Notes (Signed)
Patient informed of needing urine sample. Urinal at bedside.

## 2018-08-16 NOTE — ED Notes (Signed)
Patient transported to CT 

## 2018-08-16 NOTE — H&P (Signed)
History and Physical    Jasiyah Paulding VQM:[id number] DOB: [birth year] DOA: 08/16/2018  PCP: Hulan Fess, MD  Patient coming from: home; here with his wife  Chief Complaint: abdominal pain  HPI: Chapin Arduini is a 77 y.o. male with medical history significant of MG, hyperlipidemia, and diverticulitis/diverticulosis.  LLQ abd pain for 3 d. Worsened last night and came in. No N/V, bowel changes, bleeding. Dx'd with diverticulitis 4 yrs ago in [facility], did not require surg. No recent diet changes. He last ate just before noon. Eating does not make the pain worse. No palliative or provocative triggers he can ID. Denies fevers, urinary issues, injury.  ED Course: Patient seen in the emergency department and CT abdomen pelvis showed a perforation of the bowel.  He was started on Zosyn and the general surgery team was called.  BMP notable for hypokalemia of 3.3.  For his myasthenia gravis, he was also given 30 mg of prednisone.  Blood cultures were drawn.  Vital signs are stable, no significant leukocytosis.  Review of Systems: As per HPI otherwise 10 point review of systems negative.   Past Medical History:  Diagnosis Date  . Colon polyp   . Diverticulosis    extensive 3'15.  . Dysrhythmia    hx. Ablation for tachycardia, no routine cardiology visits now  . ED (erectile dysfunction)   . Heart murmur    teenager  . History of colon polyps   . History of kidney stones    x1 passed  . Hyperlipidemia   . Myasthenia gravis ([place])   . 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 Osteoarthritis   . Palpitation    with svt rate 150  . Tendinitis    achiles heel spur  . Testicular atrophy   . Tubular adenoma of colon     Past Surgical History:  Procedure Laterality Date  . ABLATION  2010   cardiac   . APPENDECTOMY    . COLONOSCOPY    . EUS N/A 11/17/2014   Procedure: ESOPHAGEAL ENDOSCOPIC ULTRASOUND (EUS) RADIAL;  Surgeon: [name], MD;  Location: [facility];  Service:  Endoscopy;  Laterality: N/A;  . FINE NEEDLE ASPIRATION N/A 11/17/2014   Procedure: FINE NEEDLE ASPIRATION (FNA) LINEAR;  Surgeon: [name], MD;  Location: [facility];  Service: Endoscopy;  Laterality: N/A;  + or- fna  . FLEXIBLE SIGMOIDOSCOPY    . HEMORRHOID SURGERY     banding   . HERNIA REPAIR Bilateral    '05 bilateral hernia  . KNEE ARTHROSCOPY Left    scope '08  . KNEE ARTHROSCOPY    . LAPAROSCOPIC APPENDECTOMY N/A 09/05/2014   Procedure: APPENDECTOMY LAPAROSCOPIC;  Surgeon: [name], MD;  Location: [place];  Service: General;  Laterality: N/A;  . ROTATOR CUFF REPAIR Right 2016     reports that he quit smoking about 17 years ago. His smoking use included cigarettes. He has a 47.00 pack-year smoking history. He has never used smokeless tobacco. He reports that he drinks about 1.0 standard drinks of alcohol per week. He reports that he does not use drugs.  Allergies  Allergen Reactions  . Pollen Extract Other (See Comments)    Nasal congestion    Family History  Problem Relation Age of Onset  . Pancreatic cancer Father   . Stroke Mother   . Aneurysm Brother   . Neuropathy Neg Hx    Unacceptable: Noncontributory, unremarkable, or negative. Acceptable: Family history reviewed and not pertinent (If you reviewed it)  Prior to Admission medications   Medication Sig Start Date End Date Taking? Authorizing Provider  aspirin 81 MG tablet Take 1 tablet (81 mg total) by mouth daily. 07/25/18  Yes Patel, Donika K, DO  atorvastatin (LIPITOR) 10 MG tablet Take 1 tablet (10 mg total) by mouth daily at 6 PM. 07/14/18  Yes Patel, Donika K, DO  Calcium Carbonate-Vitamin D (CALCIUM 600+D) 600-400 MG-UNIT per tablet Take 1 tablet by mouth daily.     Yes [provider]  Cyanocobalamin (VITAMIN B-12 PO) Take 1 tablet by mouth daily.   Yes [provider]  doxazosin (CARDURA) 8 MG tablet Take 8 mg by mouth daily.   Yes [provider]  feeding supplement,  ENSURE ENLIVE, (ENSURE ENLIVE) LIQD Take 237 mLs by mouth 3 (three) times daily between meals. 07/23/18  Yes Ghimire, Henreitta Leber, MD  finasteride (PROSCAR) 5 MG tablet Take 5 mg by mouth daily.  08/20/14  Yes [provider]  Glucosamine HCl 1500 MG TABS Take 1,500 mg by mouth daily.   Yes [provider]  ibuprofen (ADVIL,MOTRIN) 200 MG tablet Take 200 mg by mouth every 6 (six) hours as needed for headache (pain).   Yes [provider]  Multiple Vitamin (MULTIVITAMIN WITH MINERALS) TABS tablet Take 1 tablet by mouth daily.   Yes [provider]  predniSONE (DELTASONE) 10 MG tablet Take 1 tablet (10 mg total) by mouth 2 (two) times daily with a meal. Patient taking differently: Take 10-20 mg by mouth See admin instructions. Take 20 mg in the morning and 10 mg in the evening 08/06/18  Yes Patel, Donika K, DO  pyridostigmine (MESTINON) 60 MG tablet Take 0.5 tablets (30 mg total) by mouth 3 (three) times daily. Take half tablet at 8am, 1pm, and 6pm Patient taking differently: Take 30 mg by mouth 3 (three) times daily. Take 30 mg tablet at 8am, 1pm, and 6pm 07/25/18  Yes Alda Berthold, DO    Physical Exam: Vitals:   08/16/18 1330 08/16/18 1331 08/16/18 1404 08/16/18 1430  BP: 108/65  100/63 109/65  Pulse: 93  91 84  Resp: 18  16 16   Temp:      TempSrc:      SpO2: 94%  93% 96%  Weight:  87 kg    Height:  6' (1.829 m)      Constitutional: NAD, calm, comfortable Vitals:   08/16/18 1330 08/16/18 1331 08/16/18 1404 08/16/18 1430  BP: 108/65  100/63 109/65  Pulse: 93  91 84  Resp: 18  16 16   Temp:      TempSrc:      SpO2: 94%  93% 96%  Weight:  87 kg    Height:  6' (1.829 m)     Eyes: PERRL, lids and conjunctivae normal ENMT: Mucous membranes are moist. Posterior pharynx clear of any exudate or lesions.Normal dentition.  Neck: normal, supple, no masses, no thyromegaly Respiratory: clear to auscultation bilaterally, no wheezing, no crackles. Normal  respiratory effort. No accessory muscle use.  Cardiovascular: Regular rate and rhythm, no murmurs / rubs / gallops. No extremity edema. 2+ pedal pulses. No carotid bruits.  Abdomen: +ttp in LLQ, moderate distension, no masses palpated. No hepatosplenomegaly. Bowel sounds positive.  Musculoskeletal: no clubbing / cyanosis. No joint deformity upper and lower extremities. Good ROM, no contractures. Normal muscle tone.  Skin: no rashes, lesions, ulcers. No induration Neurologic: CN 2-12 grossly intact. Sensation intact, no cerebellar signs. Strength 5/5 in all 4.  Psychiatric: Normal  judgment and insight. Alert and oriented x 3. Normal mood.    Labs on Admission: I have personally reviewed following labs and imaging studies  CBC: Recent Labs  Lab 08/16/18 0915  WBC 10.9*  HGB 13.7  HCT 43.1  MCV 91.9  PLT 811   Basic Metabolic Panel: Recent Labs  Lab 08/16/18 0915  NA 139  K 3.3*  CL 101  CO2 30  GLUCOSE 121*  BUN 15  CREATININE 0.91  CALCIUM 9.0   GFR: Estimated Creatinine Clearance: 73.4 mL/min (by C-G formula based on SCr of 0.91 mg/dL). Liver Function Tests: Recent Labs  Lab 08/16/18 0915  AST 16  ALT 18  ALKPHOS 54  BILITOT 1.7*  PROT 6.5  ALBUMIN 3.0*   Recent Labs  Lab 08/16/18 0915  LIPASE 30  Urine analysis:    Component Value Date/Time   COLORURINE YELLOW 08/16/2018 1409   APPEARANCEUR HAZY (A) 08/16/2018 1409   LABSPEC 1.038 (H) 08/16/2018 1409   PHURINE 9.0 (H) 08/16/2018 1409   GLUCOSEU NEGATIVE 08/16/2018 1409   HGBUR NEGATIVE 08/16/2018 1409   BILIRUBINUR NEGATIVE 08/16/2018 1409   KETONESUR NEGATIVE 08/16/2018 1409   PROTEINUR NEGATIVE 08/16/2018 1409   UROBILINOGEN 0.2 09/05/2014 1218   NITRITE NEGATIVE 08/16/2018 1409   LEUKOCYTESUR NEGATIVE 08/16/2018 1409    Radiological Exams on Admission: Ct Abdomen Pelvis W Contrast  Result Date: 08/16/2018 CLINICAL DATA:  77 year old with abdominal distension and pain. EXAM: CT ABDOMEN AND  PELVIS WITH CONTRAST TECHNIQUE: Multidetector CT imaging of the abdomen and pelvis was performed using the standard protocol following bolus administration of intravenous contrast. CONTRAST:  178mL OMNIPAQUE IOHEXOL 300 MG/ML  SOLN COMPARISON:  09/05/2014 FINDINGS: Lower chest: Chronic densities in the lower lungs are most compatible with post inflammatory changes and scarring. No pleural effusions. Hepatobiliary: Again noted are multiple hypodensities in the liver that are compatible with hepatic cysts. Normal appearance of the gallbladder without biliary dilatation. The main portal venous system is patent. Pancreas: Unremarkable. No pancreatic ductal dilatation or surrounding inflammatory changes. Spleen: Normal in size without focal abnormality. Adrenals/Urinary Tract: Stable nodularity involving the lateral limb of left adrenal gland and unchanged since 2015. This is likely a benign finding based on the stability. There may be a second small nodule involving the inferior aspect of the left adrenal gland which is unchanged. Right adrenal gland is unremarkable. Several right renal cysts without hydronephrosis. Evidence for several small bladder calculi. Largest bladder calculus roughly measures 4 mm. Again noted is a large left renal sinus cyst or cysts measuring up to 7.8 cm. There is no evidence for left hydronephrosis based on the delayed imaging. No ureter dilatation. Stomach/Bowel: Extensive diverticulosis involving the left colon and sigmoid colon. There is a large amount of extraluminal gas with pericolonic edema centered around the descending colon. This is best seen on sequence 4, image 45. Large amount of gas is tracking medially into the left upper abdominal mesentery. Large amount of gas around the descending colon but there is not a discrete fluid or abscess collection. No significant colonic wall thickening. Probable appendectomy. Normal appearance of small bowel without obstruction. Stomach is  unremarkable. Vascular/Lymphatic: Atherosclerotic calcifications in the abdominal aorta without aneurysm. No enlarged lymph nodes in the abdomen or pelvis. Reproductive: Prostate is enlarged, lobulated and indents the posterior base of the bladder. Prostate measures 5.9 x 5.6 x 6.4 cm. Calcifications within the prostate gland. Other: No significant free fluid. The free air is associated with the descending colon tracks into  the left abdominal mesentery. Musculoskeletal: Degenerative facet disease in lower lumbar spine. IMPRESSION: 1. Bowel perforation and inflammatory changes involving the descending colon. Large amount of extraluminal gas surrounding the descending colon that tracks into the upper and central mesentery. Based on the extensive diverticulosis, findings are likely secondary to acute diverticulitis. No focal fluid or abscess collection at this time. 2. Multiple hepatic and renal cysts. 3. Several small bladder calculi.  No hydronephrosis. 4. Prostate enlargement. These results were called by telephone at the time of interpretation on 08/16/2018 at 1:00 pm to Dr. Ronnald Nian, who verbally acknowledged these results. Electronically Signed   By: Markus Daft M.D.   On: 08/16/2018 13:01    Assessment/Plan Active Problems:   Bowel perforation [facility])  Surgery consulted  Zosyn 3.375 mg q 8 hrs  Blood cultures pending  Pt appears well, monitor vitals  NPO  IVF's LR 100 mL/hr  Ofirmev 1000 mg q 6 hrs prn pain  Morphine 1-2 mg q 3 hrs prn pain  Ck AM CBC and BMP   Myasthenia Gravis (HCC)  Hold PO meds  Discussed with pharm- 16 mg Solumedrol in AM, 8 mg Solumedrol in PM  Discussed with pharm- 1 mg injection pyridostigmine TID   Hypokalemia  Replace with 10 mEq/hr for 3 hrs   Hyperlipidemia  Hold Lipitor pending GS consult   BPH (benign prostatic hyperplasia)  Hold Finasteride pending GS consult   DVT prophylaxis: SCDs- will hold on Lovenox/Hep given potential for surg Code Status:  Full Family Communication: Spoke with wife at bedside with patient Disposition Plan: I expect patient to be discharged after 2 midnights of inpatient care. Consults called: ED called general surgery team- Dr. Barry Dienes Admission status: Inpatient   Shelda Pal, [place] [facility] Pager [phone]  If 7PM-7AM, please contact night-coverage [email or web address] Password [email or web address]  08/16/2018, 3:03 PM

## 2018-08-16 NOTE — Plan of Care (Signed)
  Problem: Pain Managment: Goal: General experience of comfort will improve Outcome: Progressing   Problem: Safety: Goal: Ability to remain free from injury will improve Outcome: Progressing   Problem: Skin Integrity: Goal: Risk for impaired skin integrity will decrease Outcome: Progressing   

## 2018-08-16 NOTE — ED Triage Notes (Signed)
Pt to ER for evaluation of lower abdominal pain onset Wednesday with worsening. Denies nausea, vomiting, diarrhea. Reports hx of appendectomy and two hernia repairs. Reports hx of colon abscess and diverticulitis in the past. Pt in NAD.

## 2018-08-16 NOTE — ED Provider Notes (Addendum)
[facility] EMERGENCY DEPARTMENT Provider Note   CSN: [id number] Arrival date & time: 08/16/18  1025     History   Chief Complaint Chief Complaint  Patient presents with  . Abdominal Pain    HPI [name] is a 77 y.o. male.  HPI   77 year old male with past medical history as below here with lower abdominal pain.  The patient states that over the last 2 days, he had gradual onset of progressively worsening lower abdominal pain.  He describes the pain as an aching, gnawing, cramp-like sensation that is worse with palpation and movement.  He states that over the last 12 hours, his pain has worsened and he was unable to sleep.  He strongly denies any associated fever, nausea, vomiting, or diarrhea.  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  He states he is recovering well from that.  No new double vision or decreased vision.  He has chronic dysphasia that is improving.  No other complaints.  Pain is worse with palpation as mentioned, but does not change with eating.  No alleviating factors.  He has not taken anything new today.  Past Medical History:  Diagnosis Date  . Colon polyp   . Diverticulosis    extensive 3'15.  . Dysrhythmia    hx. Ablation for tachycardia, no routine cardiology visits now  . ED (erectile dysfunction)   . Heart murmur    teenager  . History of colon polyps   . History of kidney stones    x1 passed  . Hyperlipidemia   . 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 Neuromuscular disorder ([place])    "neuroma"   . Osteoarthritis   . Palpitation    with svt rate 150  . Tendinitis    achiles heel spur  . Testicular atrophy   . Tubular adenoma of colon     Patient Active Problem List   Diagnosis Date Noted  . Bowel perforation ([place]) 08/16/2018  . Hyperlipidemia 08/16/2018  . Hypokalemia 08/16/2018  . Protein-calorie malnutrition, severe 07/18/2018  . Myasthenia ([place]) 07/17/2018  .  Dysphasia 07/17/2018  . Facial droop 06/14/2018  . BPH (benign prostatic hyperplasia) 06/14/2018  . HTN (hypertension) 06/14/2018  . 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 Unilateral primary osteoarthritis, left knee 03/17/2018  . Mass of stomach 11/02/2014  . 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 Solitary pulmonary nodule 10/26/2014  . Appendicitis 09/05/2014  . Palpitations 02/15/2011  . SUPRAVENTRICULAR TACHYCARDIA 01/11/2011    Past Surgical History:  Procedure Laterality Date  . ABLATION  2010   cardiac   . APPENDECTOMY    . COLONOSCOPY    . EUS N/A 11/17/2014   Procedure: ESOPHAGEAL ENDOSCOPIC ULTRASOUND (EUS) RADIAL;  Surgeon: [name], MD;  Location: [facility];  Service: Endoscopy;  Laterality: N/A;  . FINE NEEDLE ASPIRATION N/A 11/17/2014   Procedure: FINE NEEDLE ASPIRATION (FNA) LINEAR;  Surgeon: [name], MD;  Location: [facility];  Service: Endoscopy;  Laterality: N/A;  + or- fna  . FLEXIBLE SIGMOIDOSCOPY    . HEMORRHOID SURGERY     banding   . HERNIA REPAIR Bilateral    '05 bilateral hernia  . KNEE ARTHROSCOPY Left    scope '08  . KNEE ARTHROSCOPY    . LAPAROSCOPIC APPENDECTOMY N/A 09/05/2014   Procedure: APPENDECTOMY LAPAROSCOPIC;  Surgeon: [name], MD;  Location: [facility];  Service: General;  Laterality: N/A;  . ROTATOR CUFF REPAIR Right 2016  Home Medications    Prior to Admission medications   Medication Sig Start Date End Date Taking? Authorizing Provider  aspirin 81 MG tablet Take 1 tablet (81 mg total) by mouth daily. 07/25/18  Yes Patel, Donika K, DO  atorvastatin (LIPITOR) 10 MG tablet Take 1 tablet (10 mg total) by mouth daily at 6 PM. 07/14/18  Yes Patel, Donika K, DO  Calcium Carbonate-Vitamin D (CALCIUM 600+D) 600-400 MG-UNIT per tablet Take 1 tablet by mouth daily.     Yes [provider]  Cyanocobalamin (VITAMIN B-12 PO) Take 1 tablet by mouth daily.   Yes [provider]  doxazosin (CARDURA) 8 MG tablet Take  8 mg by mouth daily.   Yes [provider]  feeding supplement, ENSURE ENLIVE, (ENSURE ENLIVE) LIQD Take 237 mLs by mouth 3 (three) times daily between meals. 07/23/18  Yes Ghimire, Henreitta Leber, MD  finasteride (PROSCAR) 5 MG tablet Take 5 mg by mouth daily.  08/20/14  Yes [provider]  Glucosamine HCl 1500 MG TABS Take 1,500 mg by mouth daily.   Yes [provider]  ibuprofen (ADVIL,MOTRIN) 200 MG tablet Take 200 mg by mouth every 6 (six) hours as needed for headache (pain).   Yes [provider]  Multiple Vitamin (MULTIVITAMIN WITH MINERALS) TABS tablet Take 1 tablet by mouth daily.   Yes [provider]  predniSONE (DELTASONE) 10 MG tablet Take 1 tablet (10 mg total) by mouth 2 (two) times daily with a meal. Patient taking differently: Take 10-20 mg by mouth See admin instructions. Take 20 mg in the morning and 10 mg in the evening 08/06/18  Yes Patel, Donika K, DO  pyridostigmine (MESTINON) 60 MG tablet Take 0.5 tablets (30 mg total) by mouth 3 (three) times daily. Take half tablet at 8am, 1pm, and 6pm Patient taking differently: Take 30 mg by mouth 3 (three) times daily. Take 30 mg tablet at 8am, 1pm, and 6pm 07/25/18  Yes Alda Berthold, DO    Family History Family History  Problem Relation Age of Onset  . Pancreatic cancer Father   . Stroke Mother   . Aneurysm Brother   . Neuropathy Neg Hx     Social History Social History   Tobacco Use  . Smoking status: Former Smoker    Packs/day: 1.00    Years: 47.00    Pack years: 47.00    Types: Cigarettes    Last attempt to quit: 10/29/2000    Years since quitting: 17.8  . Smokeless tobacco: Never Used  Substance Use Topics  . Alcohol use: Yes    Alcohol/week: 1.0 standard drinks    Types: 1 Standard drinks or equivalent per week    Comment: socially- nightly 1 cocktail  . Drug use: No     Allergies   Pollen extract   Review of Systems Review of Systems  Constitutional: Negative  for chills, fatigue and fever.  HENT: Negative for congestion and rhinorrhea.   Eyes: Negative for visual disturbance.  Respiratory: Negative for cough, shortness of breath and wheezing.   Cardiovascular: Negative for chest pain and leg swelling.  Gastrointestinal: Positive for abdominal pain. Negative for diarrhea, nausea and vomiting.  Genitourinary: Negative for dysuria and flank pain.  Musculoskeletal: Negative for neck pain and neck stiffness.  Skin: Negative for rash and wound.  Allergic/Immunologic: Negative for immunocompromised state.  Neurological: Negative for syncope, weakness and headaches.  All other systems reviewed and are negative.    Physical Exam Updated Vital Signs  BP 103/87 (BP Location: Right Arm)   Pulse 91   Temp 98.4 F (36.9 C) (Oral)   Resp 17   Ht 6' (1.829 m)   Wt 87 kg   SpO2 96%   BMI 26.01 kg/m   Physical Exam  Constitutional: He is oriented to person, place, and time. He appears well-developed and well-nourished. No distress.  HENT:  Head: Normocephalic and atraumatic.  Eyes: Conjunctivae are normal.  Neck: Neck supple.  Cardiovascular: Normal rate, regular rhythm and normal heart sounds. Exam reveals no friction rub.  No murmur heard. Pulmonary/Chest: Effort normal and breath sounds normal. No respiratory distress. He has no wheezes. He has no rales.  Abdominal: He exhibits no distension. There is tenderness in the suprapubic area, left upper quadrant and left lower quadrant. There is rebound. There is no rigidity and no guarding.  Musculoskeletal: He exhibits no edema.  Neurological: He is alert and oriented to person, place, and time. He exhibits normal muscle tone.  Skin: Skin is warm. Capillary refill takes less than 2 seconds.  Psychiatric: He has a normal mood and affect.  Nursing note and vitals reviewed.    ED Treatments / Results  Labs (all labs ordered are listed, but only abnormal results are displayed) Labs Reviewed    COMPREHENSIVE METABOLIC PANEL - Abnormal; Notable for the following components:      Result Value   Potassium 3.3 (*)    Glucose, Bld 121 (*)    Albumin 3.0 (*)    Total Bilirubin 1.7 (*)    All other components within normal limits  CBC - Abnormal; Notable for the following components:   WBC 10.9 (*)    All other components within normal limits  URINALYSIS, ROUTINE W REFLEX MICROSCOPIC - Abnormal; Notable for the following components:   APPearance HAZY (*)    Specific Gravity, Urine 1.038 (*)    pH 9.0 (*)    All other components within normal limits  CULTURE, BLOOD (ROUTINE X 2)  CULTURE, BLOOD (ROUTINE X 2)  LIPASE, BLOOD  I-STAT CG4 LACTIC ACID, ED    EKG EKG Interpretation  Date/Time:  Saturday August 16 2018 09:28:08 EDT Ventricular Rate:  98 PR Interval:    QRS Duration: 101 QT Interval:  366 QTC Calculation: 468 R Axis:   32 Text Interpretation:  Sinus rhythm Borderline repolarization abnormality Baseline wander in lead(s) V3 Non-specific TW changes in precordial leads Otherwise no significant change Confirmed by Duffy Bruce [phone]) on 08/16/2018 9:36:18 AM   Radiology Ct Abdomen Pelvis W Contrast  Result Date: 08/16/2018 CLINICAL DATA:  77 year old with abdominal distension and pain. EXAM: CT ABDOMEN AND PELVIS WITH CONTRAST TECHNIQUE: Multidetector CT imaging of the abdomen and pelvis was performed using the standard protocol following bolus administration of intravenous contrast. CONTRAST:  167mL OMNIPAQUE IOHEXOL 300 MG/ML  SOLN COMPARISON:  09/05/2014 FINDINGS: Lower chest: Chronic densities in the lower lungs are most compatible with post inflammatory changes and scarring. No pleural effusions. Hepatobiliary: Again noted are multiple hypodensities in the liver that are compatible with hepatic cysts. Normal appearance of the gallbladder without biliary dilatation. The main portal venous system is patent. Pancreas: Unremarkable. No pancreatic ductal dilatation  or surrounding inflammatory changes. Spleen: Normal in size without focal abnormality. Adrenals/Urinary Tract: Stable nodularity involving the lateral limb of left adrenal gland and unchanged since 2015. This is likely a benign finding based on the stability. There may be a second small nodule involving the inferior aspect of the left adrenal gland  which is unchanged. Right adrenal gland is unremarkable. Several right renal cysts without hydronephrosis. Evidence for several small bladder calculi. Largest bladder calculus roughly measures 4 mm. Again noted is a large left renal sinus cyst or cysts measuring up to 7.8 cm. There is no evidence for left hydronephrosis based on the delayed imaging. No ureter dilatation. Stomach/Bowel: Extensive diverticulosis involving the left colon and sigmoid colon. There is a large amount of extraluminal gas with pericolonic edema centered around the descending colon. This is best seen on sequence 4, image 45. Large amount of gas is tracking medially into the left upper abdominal mesentery. Large amount of gas around the descending colon but there is not a discrete fluid or abscess collection. No significant colonic wall thickening. Probable appendectomy. Normal appearance of small bowel without obstruction. Stomach is unremarkable. Vascular/Lymphatic: Atherosclerotic calcifications in the abdominal aorta without aneurysm. No enlarged lymph nodes in the abdomen or pelvis. Reproductive: Prostate is enlarged, lobulated and indents the posterior base of the bladder. Prostate measures 5.9 x 5.6 x 6.4 cm. Calcifications within the prostate gland. Other: No significant free fluid. The free air is associated with the descending colon tracks into the left abdominal mesentery. Musculoskeletal: Degenerative facet disease in lower lumbar spine. IMPRESSION: 1. Bowel perforation and inflammatory changes involving the descending colon. Large amount of extraluminal gas surrounding the descending  colon that tracks into the upper and central mesentery. Based on the extensive diverticulosis, findings are likely secondary to acute diverticulitis. No focal fluid or abscess collection at this time. 2. Multiple hepatic and renal cysts. 3. Several small bladder calculi.  No hydronephrosis. 4. Prostate enlargement. These results were called by telephone at the time of interpretation on 08/16/2018 at 1:00 pm to Dr. Ronnald Nian, who verbally acknowledged these results. Electronically Signed   By: Markus Daft M.D.   On: 08/16/2018 13:01    Procedures .Critical Care Performed by: Duffy Bruce, MD Authorized by: Duffy Bruce, MD   Critical care provider statement:    Critical care time (minutes):  35   Critical care time was exclusive of:  Separately billable procedures and treating other patients and teaching time   Critical care was necessary to treat or prevent imminent or life-threatening deterioration of the following conditions:  Dehydration, sepsis and circulatory failure   Critical care was time spent personally by me on the following activities:  Development of treatment plan with patient or surrogate, discussions with consultants, evaluation of patient's response to treatment, examination of patient, obtaining history from patient or surrogate, ordering and performing treatments and interventions, ordering and review of laboratory studies, ordering and review of radiographic studies, pulse oximetry, re-evaluation of patient's condition and review of old charts   I assumed direction of critical care for this patient from another provider in my specialty: no     (including critical care time)  Medications Ordered in ED Medications  lactated ringers infusion ( Intravenous New Bag/Given 08/16/18 1530)  ondansetron (ZOFRAN) injection 4 mg (has no administration in time range)  methylPREDNISolone sodium succinate (SOLU-MEDROL) 40 mg/mL injection 16 mg (16 mg Intravenous Given 08/16/18 1638)    methylPREDNISolone sodium succinate (SOLU-MEDROL) 40 mg/mL injection 8 mg (has no administration in time range)  pyridostigmine (MESTINON) injection 1 mg (1 mg Intravenous Given 08/16/18 1647)  potassium chloride 10 mEq in 100 mL IVPB (10 mEq Intravenous New Bag/Given 08/16/18 1643)  piperacillin-tazobactam (ZOSYN) IVPB 3.375 g (has no administration in time range)  acetaminophen (OFIRMEV) IV 1,000 mg (has no administration in  time range)  morphine bolus via infusion 1-2 mg (has no administration in time range)  morphine 2 MG/ML injection 1-2 mg (has no administration in time range)  sodium chloride 0.9 % bolus 1,000 mL (0 mLs Intravenous Stopped 08/16/18 1449)  sodium chloride 0.9 % bolus 1,000 mL (0 mLs Intravenous Stopped 08/16/18 1332)  iohexol (OMNIPAQUE) 300 MG/ML solution 100 mL (100 mLs Intravenous Contrast Given 08/16/18 1002)  piperacillin-tazobactam (ZOSYN) IVPB 3.375 g ( Intravenous Stopped 08/16/18 1432)  hydrocortisone sodium succinate (SOLU-CORTEF) 100 MG injection 100 mg (100 mg Intravenous Given 08/16/18 1403)  sodium chloride 0.9 % bolus 1,000 mL (1,000 mLs Intravenous New Bag/Given 08/16/18 1405)  pyridostigmine (MESTINON) tablet 30 mg (30 mg Oral Given 08/16/18 1447)     Initial Impression / Assessment and Plan / ED Course  I have reviewed the triage vital signs and the nursing notes.  Pertinent labs & imaging results that were available during my care of the patient were reviewed by me and considered in my medical decision making (see chart for details).  Clinical Course as of Aug 16 1650  Sat Aug 16, 2018  [id number]    [CI]    Clinical Course User Index [CI] Duffy Bruce, MD    77 yo M here with abd pain. On arrival, pt initially normotensive but had likely vagal episode during blood draw (h/o same), dropping pressure to 70s. This returned to 100s on arrival to room. Clinically, concern for diverticulitis, colitis. No fever, HR now 90s, BP back to baseline so  will start IVF but hold on sepsis protocol, hesitant to introduce new ABX/drugs given his h/o myasthenia, unless necessary. No signs of myasthenic crisis.  CT imaging shows perf diverticulitis. LA reassuring but WBC elevated so pt ultimately activated as sepsis protocol - initially delayed 2/2 vagal reaction/return to normal vitals, no obvious infectious source (WBC attributed to prednisone use). IV ABX, steroids given. Surgery consulted, Dr. Barry Dienes. Admit to medicine, likely step down for bowel perf, possible early sepsis.  Final Clinical Impressions(s) / ED Diagnoses   Final diagnoses:  Perforated diverticulum  Bowel perforation ([place])  Sepsis, due to unspecified organism, unspecified whether acute organ dysfunction present [facility])    ED Discharge Orders    None       Duffy Bruce, MD 08/16/18 1651    Duffy Bruce, MD 08/16/18 1652    Duffy Bruce, MD 09/04/18 1154

## 2018-08-16 NOTE — ED Notes (Signed)
While sitting in triage, patient became very faint, pale, BP noted in to 70's. Patient placed in trendelenburg with resolution of symptoms and BP up to 88 systolic. Pt remained conscious and oriented.

## 2018-08-16 NOTE — Consult Note (Signed)
Reason for Consult:  diverticulitis with contained perforation Referring Physician: Ellender Hose, MD  [name] is an 77 y.o. male.  HPI:  Pt is a 77 yo M who presented to ED with around 3 days of abdominal pain and bloating.  This was relatively stable until much more severe pain awoke him this morning.  He has known history of diverticulitis, which was diagnosed 4 years ago when he was living in [place].  He required 5 day hospital admission then, but no surgery.    He has c-scopes every 5 years due to polyps.  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  He received IVIG for this.  It started with diplopia and he has had swallowing dysfunction.  For this he is on a soft diet.  He is now treated with steroids and mestinon.  He denies fever/chills.  He denies constipation or diarrhea.    Past Medical History:  Diagnosis Date  . Colon polyp   . Diverticulosis    extensive 3'15.  . Dysrhythmia    hx. Ablation for tachycardia, no routine cardiology visits now  . ED (erectile dysfunction)   . Heart murmur    teenager  . History of colon polyps   . History of kidney stones    x1 passed  . Hyperlipidemia   . Myasthenia gravis ([place])   . Neuromuscular disorder ([place])    "neuroma"   . Osteoarthritis   . Palpitation    with svt rate 150  . Tendinitis    achiles heel spur  . Testicular atrophy   . Tubular adenoma of colon     Past Surgical History:  Procedure Laterality Date  . ABLATION  2010   cardiac   . APPENDECTOMY    . COLONOSCOPY    . EUS N/A 11/17/2014   Procedure: ESOPHAGEAL ENDOSCOPIC ULTRASOUND (EUS) RADIAL;  Surgeon: [name], MD;  Location: [facility];  Service: Endoscopy;  Laterality: N/A;  . FINE NEEDLE ASPIRATION N/A 11/17/2014   Procedure: FINE NEEDLE ASPIRATION (FNA) LINEAR;  Surgeon: [name], MD;  Location: [facility];  Service: Endoscopy;  Laterality: N/A;  + or- fna  . FLEXIBLE SIGMOIDOSCOPY    . HEMORRHOID SURGERY     banding   .  HERNIA REPAIR Bilateral    '05 bilateral hernia  . KNEE ARTHROSCOPY Left    scope '08  . KNEE ARTHROSCOPY    . LAPAROSCOPIC APPENDECTOMY N/A 09/05/2014   Procedure: APPENDECTOMY LAPAROSCOPIC;  Surgeon: [name], MD;  Location: [facility];  Service: General;  Laterality: N/A;  . ROTATOR CUFF REPAIR Right 2016    Family History  Problem Relation Age of Onset  . Pancreatic cancer Father   . Stroke Mother   . Aneurysm Brother   . Neuropathy Neg Hx     Social History:  reports that he quit smoking about 17 years ago. His smoking use included cigarettes. He has a 47.00 pack-year smoking history. He has never used smokeless tobacco. He reports that he drinks about 1.0 standard drinks of alcohol per week. He reports that he does not use drugs.  Allergies:  Allergies  Allergen Reactions  . Pollen Extract Other (See Comments)    Nasal congestion    Medications:  Prior to Admission:  Medications Prior to Admission  Medication Sig Dispense Refill Last Dose  . aspirin 81 MG tablet Take 1 tablet (81 mg total) by mouth daily.   08/15/2018 at Unknown time  . atorvastatin (LIPITOR) 10 MG  tablet Take 1 tablet (10 mg total) by mouth daily at 6 PM. 30 tablet 5 08/15/2018 at Unknown time  . Calcium Carbonate-Vitamin D (CALCIUM 600+D) 600-400 MG-UNIT per tablet Take 1 tablet by mouth daily.     08/15/2018 at Unknown time  . Cyanocobalamin (VITAMIN B-12 PO) Take 1 tablet by mouth daily.   08/15/2018 at Unknown time  . doxazosin (CARDURA) 8 MG tablet Take 8 mg by mouth daily.   08/16/2018 at Unknown time  . feeding supplement, ENSURE ENLIVE, (ENSURE ENLIVE) LIQD Take 237 mLs by mouth 3 (three) times daily between meals. 60 Bottle 60 08/16/2018 at Unknown time  . finasteride (PROSCAR) 5 MG tablet Take 5 mg by mouth daily.   2 08/16/2018 at Unknown time  . Glucosamine HCl 1500 MG TABS Take 1,500 mg by mouth daily.   08/15/2018 at Unknown time  . ibuprofen (ADVIL,MOTRIN) 200 MG tablet Take 200 mg by  mouth every 6 (six) hours as needed for headache (pain).   08/15/2018 at prn  . Multiple Vitamin (MULTIVITAMIN WITH MINERALS) TABS tablet Take 1 tablet by mouth daily.   08/15/2018 at Unknown time  . predniSONE (DELTASONE) 10 MG tablet Take 1 tablet (10 mg total) by mouth 2 (two) times daily with a meal. (Patient taking differently: Take 10-20 mg by mouth See admin instructions. Take 20 mg in the morning and 10 mg in the evening) 90 tablet 3 08/16/2018 at Unknown time  . pyridostigmine (MESTINON) 60 MG tablet Take 0.5 tablets (30 mg total) by mouth 3 (three) times daily. Take half tablet at 8am, 1pm, and 6pm (Patient taking differently: Take 30 mg by mouth 3 (three) times daily. Take 30 mg tablet at 8am, 1pm, and 6pm) 135 tablet 3 08/16/2018 at Unknown time    Results for orders placed or performed during the hospital encounter of 08/16/18 (from the past 48 hour(s))  Lipase, blood     Status: None   Collection Time: 08/16/18  9:15 AM  Result Value Ref Range   Lipase 30 11 - 51 U/L    Comment: Performed at [facility], [address], [place], [place] [address]  Comprehensive metabolic panel     Status: Abnormal   Collection Time: 08/16/18  9:15 AM  Result Value Ref Range   Sodium 139 135 - 145 mmol/L   Potassium 3.3 (L) 3.5 - 5.1 mmol/L   Chloride 101 98 - 111 mmol/L   CO2 30 22 - 32 mmol/L   Glucose, Bld 121 (H) 70 - 99 mg/dL   BUN 15 8 - 23 mg/dL   Creatinine, Ser 0.91 0.61 - 1.24 mg/dL   Calcium 9.0 8.9 - 10.3 mg/dL   Total Protein 6.5 6.5 - 8.1 g/dL   Albumin 3.0 (L) 3.5 - 5.0 g/dL   AST 16 15 - 41 U/L   ALT 18 0 - 44 U/L   Alkaline Phosphatase 54 38 - 126 U/L   Total Bilirubin 1.7 (H) 0.3 - 1.2 mg/dL   GFR calc non Af Amer >60 >60 mL/min   GFR calc Af Amer >60 >60 mL/min    Comment: (NOTE) The eGFR has been calculated using the CKD EPI equation. This calculation has not been validated in all clinical situations. eGFR's persistently <60 mL/min signify possible Chronic  Kidney Disease.    Anion gap 8 5 - 15    Comment: Performed at [facility] [address] [place], [place] [address]  CBC  Status: Abnormal   Collection Time: 08/16/18  9:15 AM  Result Value Ref Range   WBC 10.9 (H) 4.0 - 10.5 K/uL   RBC 4.69 4.22 - 5.81 MIL/uL   Hemoglobin 13.7 13.0 - 17.0 g/dL   HCT 43.1 39.0 - 52.0 %   MCV 91.9 80.0 - 100.0 fL   MCH 29.2 26.0 - 34.0 pg   MCHC 31.8 30.0 - 36.0 g/dL   RDW 15.1 11.5 - 15.5 %   Platelets 209 150 - 400 K/uL   nRBC 0.0 0.0 - 0.2 %    Comment: Performed at [facility], [place] [address] [place], [place] [address]  I-Stat CG4 Lactic Acid, ED     Status: None   Collection Time: 08/16/18  9:53 AM  Result Value Ref Range   Lactic Acid, Venous 1.34 0.5 - 1.9 mmol/L  Urinalysis, Routine w reflex microscopic     Status: Abnormal   Collection Time: 08/16/18  2:09 PM  Result Value Ref Range   Color, Urine YELLOW YELLOW   APPearance HAZY (A) CLEAR   Specific Gravity, Urine 1.038 (H) 1.005 - 1.030   pH 9.0 (H) 5.0 - 8.0   Glucose, UA NEGATIVE NEGATIVE mg/dL   Hgb urine dipstick NEGATIVE NEGATIVE   Bilirubin Urine NEGATIVE NEGATIVE   Ketones, ur NEGATIVE NEGATIVE mg/dL   Protein, ur NEGATIVE NEGATIVE mg/dL   Nitrite NEGATIVE NEGATIVE   Leukocytes, UA NEGATIVE NEGATIVE    Comment: Performed at [facility] [address], [place], [place] [address]    Ct Abdomen Pelvis W Contrast  Result Date: 08/16/2018 CLINICAL DATA:  77 year old with abdominal distension and pain. EXAM: CT ABDOMEN AND PELVIS WITH CONTRAST TECHNIQUE: Multidetector CT imaging of the abdomen and pelvis was performed using the standard protocol following bolus administration of intravenous contrast. CONTRAST:  120m OMNIPAQUE IOHEXOL 300 MG/ML  SOLN COMPARISON:  09/05/2014 FINDINGS: Lower chest: Chronic densities in the lower lungs are most compatible with post inflammatory changes and scarring. No pleural effusions. Hepatobiliary: Again noted are  multiple hypodensities in the liver that are compatible with hepatic cysts. Normal appearance of the gallbladder without biliary dilatation. The main portal venous system is patent. Pancreas: Unremarkable. No pancreatic ductal dilatation or surrounding inflammatory changes. Spleen: Normal in size without focal abnormality. Adrenals/Urinary Tract: Stable nodularity involving the lateral limb of left adrenal gland and unchanged since 2015. This is likely a benign finding based on the stability. There may be a second small nodule involving the inferior aspect of the left adrenal gland which is unchanged. Right adrenal gland is unremarkable. Several right renal cysts without hydronephrosis. Evidence for several small bladder calculi. Largest bladder calculus roughly measures 4 mm. Again noted is a large left renal sinus cyst or cysts measuring up to 7.8 cm. There is no evidence for left hydronephrosis based on the delayed imaging. No ureter dilatation. Stomach/Bowel: Extensive diverticulosis involving the left colon and sigmoid colon. There is a large amount of extraluminal gas with pericolonic edema centered around the descending colon. This is best seen on sequence 4, image 45. Large amount of gas is tracking medially into the left upper abdominal mesentery. Large amount of gas around the descending colon but there is not a discrete fluid or abscess collection. No significant colonic wall thickening. Probable appendectomy. Normal appearance of small bowel without obstruction. Stomach is unremarkable. Vascular/Lymphatic: Atherosclerotic calcifications in the abdominal aorta without aneurysm. No enlarged lymph nodes in the abdomen or pelvis. Reproductive: Prostate is enlarged,  lobulated and indents the posterior base of the bladder. Prostate measures 5.9 x 5.6 x 6.4 cm. Calcifications within the prostate gland. Other: No significant free fluid. The free air is associated with the descending colon tracks into the left  abdominal mesentery. Musculoskeletal: Degenerative facet disease in lower lumbar spine. IMPRESSION: 1. Bowel perforation and inflammatory changes involving the descending colon. Large amount of extraluminal gas surrounding the descending colon that tracks into the upper and central mesentery. Based on the extensive diverticulosis, findings are likely secondary to acute diverticulitis. No focal fluid or abscess collection at this time. 2. Multiple hepatic and renal cysts. 3. Several small bladder calculi.  No hydronephrosis. 4. Prostate enlargement. These results were called by telephone at the time of interpretation on 08/16/2018 at 1:00 pm to Dr. Ronnald Nian, who verbally acknowledged these results. Electronically Signed   By: Markus Daft M.D.   On: 08/16/2018 13:01    Review of Systems  Constitutional: Negative.   HENT: Negative.   Eyes: Negative.   Respiratory: Negative.   Cardiovascular: Negative.   Gastrointestinal: Positive for abdominal pain.  Genitourinary: Negative.   Musculoskeletal: Negative.   Skin: Negative.   Endo/Heme/Allergies: Negative.   Psychiatric/Behavioral: Negative.      Blood pressure 103/87, pulse 91, temperature 98.4 F (36.9 C), temperature source Oral, resp. rate 17, height 6' (1.829 m), weight 87 kg, SpO2 96 %.   Physical Exam  Constitutional: He is oriented to person, place, and time. He appears well-developed and well-nourished. No distress.  HENT:  Head: Normocephalic and atraumatic.  Right Ear: External ear normal.  Left Ear: External ear normal.  Eyes: Pupils are equal, round, and reactive to light. Conjunctivae are normal. Right eye exhibits no discharge. Left eye exhibits no discharge. No scleral icterus.  Neck: Neck supple. No thyromegaly present.  Cardiovascular: Normal rate, regular rhythm, normal heart sounds and intact distal pulses.  Respiratory: Effort normal and breath sounds normal. No respiratory distress. He exhibits no tenderness.  GI: Soft.  He exhibits distension. He exhibits no mass. There is tenderness (left mid abdomen). There is no rebound and no guarding.  Musculoskeletal: Normal range of motion.  Neurological: He is alert and oriented to person, place, and time. Coordination normal.  Skin: Skin is warm and dry. No rash noted. He is not diaphoretic. No erythema. No pallor.  Psychiatric: He has a normal mood and affect. His behavior is normal. Judgment and thought content normal.     Assessment/Plan: Diverticulitis of descending colon with contained perforation. Bowel rest Iv antibiotics  Will follow for improvement/stagnation/worsening.   High risk for abscess given air in mesentery.  Plan repeat CT in 3-4 days if no significant improvement. Discussed with patient and daughter that surgery may be needed this admit and would likely lead to ostomy given steroids and active infection.    [name] 08/16/2018, 5:12 PM

## 2018-08-17 DIAGNOSIS — K5780 Diverticulitis of intestine, part unspecified, with perforation and abscess without bleeding: Secondary | ICD-10-CM

## 2018-08-17 DIAGNOSIS — E785 Hyperlipidemia, unspecified: Secondary | ICD-10-CM

## 2018-08-17 LAB — CBC
HCT: 35.4 % — ABNORMAL LOW (ref 39.0–52.0)
Hemoglobin: 11.2 g/dL — ABNORMAL LOW (ref 13.0–17.0)
MCH: 28.7 pg (ref 26.0–34.0)
MCHC: 31.6 g/dL (ref 30.0–36.0)
MCV: 90.8 fL (ref 80.0–100.0)
Platelets: 184 10*3/uL (ref 150–400)
RBC: 3.9 MIL/uL — ABNORMAL LOW (ref 4.22–5.81)
RDW: 15.2 % (ref 11.5–15.5)
WBC: 10.6 10*3/uL — ABNORMAL HIGH (ref 4.0–10.5)
nRBC: 0 % (ref 0.0–0.2)

## 2018-08-17 LAB — BASIC METABOLIC PANEL
Anion gap: 4 — ABNORMAL LOW (ref 5–15)
BUN: 12 mg/dL (ref 8–23)
CO2: 26 mmol/L (ref 22–32)
Calcium: 8.2 mg/dL — ABNORMAL LOW (ref 8.9–10.3)
Chloride: 108 mmol/L (ref 98–111)
Creatinine, Ser: 0.61 mg/dL (ref 0.61–1.24)
GFR calc Af Amer: >60 mL/min (ref >60)
GFR calc non Af Amer: >60 mL/min (ref >60)
Glucose, Bld: 136 mg/dL — ABNORMAL HIGH (ref 70–99)
Potassium: 4 mmol/L (ref 3.5–5.1)
Sodium: 138 mmol/L (ref 135–145)

## 2018-08-17 MED ORDER — FINASTERIDE 5 MG PO TABS
5.0000 mg | ORAL_TABLET | Freq: Every day | ORAL | Status: DC
  Administered 2018-08-17 – 2018-08-21 (×5): 5 mg via ORAL
  Filled 2018-08-17 (×5): qty 1

## 2018-08-17 MED ORDER — METHYLPREDNISOLONE SODIUM SUCC 40 MG IJ SOLR
20.0000 mg | Freq: Every morning | INTRAMUSCULAR | Status: DC
  Administered 2018-08-18 – 2018-08-20 (×3): 20 mg via INTRAVENOUS
  Filled 2018-08-17 (×3): qty 1

## 2018-08-17 MED ORDER — MORPHINE SULFATE (PF) 2 MG/ML IV SOLN
1.0000 mg | INTRAVENOUS | Status: DC | PRN
  Administered 2018-08-18: 1 mg via INTRAVENOUS
  Filled 2018-08-17: qty 1

## 2018-08-17 MED ORDER — METHYLPREDNISOLONE SODIUM SUCC 40 MG IJ SOLR
10.0000 mg | Freq: Every evening | INTRAMUSCULAR | Status: DC
  Administered 2018-08-17 – 2018-08-19 (×3): 10 mg via INTRAVENOUS
  Filled 2018-08-17 (×3): qty 1

## 2018-08-17 NOTE — Progress Notes (Signed)
Subjective/Chief Complaint: Feels better   Objective: Vital signs in last 24 hours: Temp:  [97.4 F (36.3 C)-98.4 F (36.9 C)] 97.4 F (36.3 C) (10/20 0425) Pulse Rate:  [61-108] 61 (10/20 0425) Resp:  [12-26] 16 (10/20 0425) BP: (73-117)/(42-96) 114/79 (10/20 0425) SpO2:  [90 %-96 %] 96 % (10/20 0425) Weight:  [87 kg] 87 kg (10/19 1331) Last BM Date: 08/16/18  Intake/Output from previous day: 10/19 0701 - 10/20 0700 In: 3507.3 [I.V.:1307.3; IV Piggyback:2200] Out: 900 [Urine:900] Intake/Output this shift: No intake/output data recorded.  General appearance: alert and cooperative Resp: clear to auscultation bilaterally Cardio: regular rate and rhythm GI: mild LUQ tenderness. otherwise very soft  Lab Results:  Recent Labs    08/16/18 0915 08/17/18 0306  WBC 10.9* 10.6*  HGB 13.7 11.2*  HCT 43.1 35.4*  PLT 209 184   BMET Recent Labs    08/16/18 0915 08/17/18 0306  NA 139 138  K 3.3* 4.0  CL 101 108  CO2 30 26  GLUCOSE 121* 136*  BUN 15 12  CREATININE 0.91 0.61  CALCIUM 9.0 8.2*   PT/INR No results for input(s): LABPROT, INR in the last 72 hours. ABG No results for input(s): PHART, HCO3 in the last 72 hours.  Invalid input(s): PCO2, PO2  Studies/Results: Ct Abdomen Pelvis W Contrast  Result Date: 08/16/2018 CLINICAL DATA:  77 year old with abdominal distension and pain. EXAM: CT ABDOMEN AND PELVIS WITH CONTRAST TECHNIQUE: Multidetector CT imaging of the abdomen and pelvis was performed using the standard protocol following bolus administration of intravenous contrast. CONTRAST:  117mL OMNIPAQUE IOHEXOL 300 MG/ML  SOLN COMPARISON:  09/05/2014 FINDINGS: Lower chest: Chronic densities in the lower lungs are most compatible with post inflammatory changes and scarring. No pleural effusions. Hepatobiliary: Again noted are multiple hypodensities in the liver that are compatible with hepatic cysts. Normal appearance of the gallbladder without biliary  dilatation. The main portal venous system is patent. Pancreas: Unremarkable. No pancreatic ductal dilatation or surrounding inflammatory changes. Spleen: Normal in size without focal abnormality. Adrenals/Urinary Tract: Stable nodularity involving the lateral limb of left adrenal gland and unchanged since 2015. This is likely a benign finding based on the stability. There may be a second small nodule involving the inferior aspect of the left adrenal gland which is unchanged. Right adrenal gland is unremarkable. Several right renal cysts without hydronephrosis. Evidence for several small bladder calculi. Largest bladder calculus roughly measures 4 mm. Again noted is a large left renal sinus cyst or cysts measuring up to 7.8 cm. There is no evidence for left hydronephrosis based on the delayed imaging. No ureter dilatation. Stomach/Bowel: Extensive diverticulosis involving the left colon and sigmoid colon. There is a large amount of extraluminal gas with pericolonic edema centered around the descending colon. This is best seen on sequence 4, image 45. Large amount of gas is tracking medially into the left upper abdominal mesentery. Large amount of gas around the descending colon but there is not a discrete fluid or abscess collection. No significant colonic wall thickening. Probable appendectomy. Normal appearance of small bowel without obstruction. Stomach is unremarkable. Vascular/Lymphatic: Atherosclerotic calcifications in the abdominal aorta without aneurysm. No enlarged lymph nodes in the abdomen or pelvis. Reproductive: Prostate is enlarged, lobulated and indents the posterior base of the bladder. Prostate measures 5.9 x 5.6 x 6.4 cm. Calcifications within the prostate gland. Other: No significant free fluid. The free air is associated with the descending colon tracks into the left abdominal mesentery. Musculoskeletal: Degenerative facet disease  in lower lumbar spine. IMPRESSION: 1. Bowel perforation and  inflammatory changes involving the descending colon. Large amount of extraluminal gas surrounding the descending colon that tracks into the upper and central mesentery. Based on the extensive diverticulosis, findings are likely secondary to acute diverticulitis. No focal fluid or abscess collection at this time. 2. Multiple hepatic and renal cysts. 3. Several small bladder calculi.  No hydronephrosis. 4. Prostate enlargement. These results were called by telephone at the time of interpretation on 08/16/2018 at 1:00 pm to Dr. Ronnald Nian, who verbally acknowledged these results. Electronically Signed   By: Markus Daft M.D.   On: 08/16/2018 13:01    Anti-infectives: Anti-infectives (From admission, onward)   Start     Dose/Rate Route Frequency Ordered Stop   08/16/18 2200  piperacillin-tazobactam (ZOSYN) IVPB 3.375 g  Status:  Discontinued     3.375 g 100 mL/hr over 30 Minutes Intravenous Every 8 hours 08/16/18 1456 08/16/18 1507   08/16/18 2000  piperacillin-tazobactam (ZOSYN) IVPB 3.375 g     3.375 g 12.5 mL/hr over 240 Minutes Intravenous Every 8 hours 08/16/18 1508     08/16/18 1330  piperacillin-tazobactam (ZOSYN) IVPB 3.375 g     3.375 g 100 mL/hr over 30 Minutes Intravenous  Once 08/16/18 1320 08/16/18 1432      Assessment/Plan: s/p * No surgery found * Advance diet. Will allow clears today Continue IV zosyn Ambulate Diverticulitis with contained perf  LOS: 1 day    Autumn Messing III 08/17/2018

## 2018-08-17 NOTE — Progress Notes (Signed)
PROGRESS NOTE    [name]  GTX:[id number] DOB: Oct 25, 1941 DOA: 08/16/2018 PCP: Hulan Fess, MD    Brief Narrative:  77 year old male who presented with abdominal pain, he does have significant past medical history for myasthenia gravis, dyslipidemia and diverticulosis.  Reported 3 days of left lower quadrant abdominal pain, no improving or worsening factors, no radiation.  No nausea or vomiting.  On the initial physical examination blood pressure 108/65, heart rate 93, respiratory rate 18, saturation 94%.  Moist mucous membranes, lungs clear to auscultation bilaterally, heart S1-2 present rhythmic, abdomen with tenderness left lower quadrant, moderate distention, no rebound, positive bowel sounds, no lower extremity edema.  Sodium 139, potassium 3.3, chloride 101, bicarb 30, glucose 121, BUN 15, creatinine 0.91, lipase 30, AST 16, ALT 18, white count 10.9, hemoglobin 13.7, hematocrit 43.1, platelets 209.  Urinalysis negative for infection.  CT of the abdomen with bowel perforation and inflammatory changes involving the descending colon.  Large amount of extraluminal gas around the descending colon that tracks into the upper and central mesentery.  Changes secondary to diverticulitis with no focal fluid or abscess collection.  EKG sinus rhythm, normal axis, normal intervals.  Patient was admitted to the hospital with the working diagnosis of left lower quadrant abdominal pain due to perforated diverticulitis.   Assessment & Plan:   Active Problems:   BPH (benign prostatic hyperplasia)   Myasthenia (HCC)   Bowel perforation ([facility])   Hyperlipidemia   Hypokalemia  1. Perforated diverticulitis. Continue conservative medical therapy, IV fluids with balanced electrolyte solutions with dextrose, IV antibiotic therapy with Zosyn. Clear liquid diet per surgery recommendations. As needed IV analgesics and antiemetics. Continue antiacid therapy.   2. Hypokalemia. K has been corrected, this am up  to 4,0, renal function preserved with serum cr at 0,61. Will follow on renal panel in am.   3. Myasthenia Gravis. No signs of exacerbation, will continue neuro checks per unit protocol, out of bed to chair and ambulate in the hallway. Continue IV steroids equivalent to po prednisone home dose. Continue pyridostigmine.   4. Dyslipidemia. Holding atorvastatin therapy for now.    5. BPH. Continue finesteride.    DVT prophylaxis: enoxaparin   Code Status:  full Family Communication: I spoke with patient's wife at the bedside and all questions were addressed.  Disposition Plan/ discharge barriers:  Pending clinical improvement    Consultants:   Surgery   Procedures:     Antimicrobials:   IV Zosyn.     Subjective: Left lower quadrant abdominal pain improved with analgesics, no nausea or vomiting, no radiation, no worsening factors.   Objective: Vitals:   08/16/18 1430 08/16/18 1523 08/16/18 2116 08/17/18 0425  BP: 109/65 103/87 112/72 114/79  Pulse: 84 91 66 61  Resp: 16 17 16 16   Temp:  98.4 F (36.9 C) 98.2 F (36.8 C) (!) 97.4 F (36.3 C)  TempSrc:  Oral Oral Oral  SpO2: 96% 96% 95% 96%  Weight:      Height:        Intake/Output Summary (Last 24 hours) at 08/17/2018 1159 Last data filed at 08/17/2018 0745 Gross per 24 hour  Intake 3507.26 ml  Output 1200 ml  Net 2307.26 ml   Filed Weights   08/16/18 1331  Weight: 87 kg    Examination:   General: not in pain or dyspnea  Neurology: Awake and alert, non focal  E ENT: mild pallor, no icterus, oral mucosa moist Cardiovascular: No JVD. S1-S2 present, rhythmic, no gallops,  rubs, or murmurs. No lower extremity edema. Pulmonary: positive breath sounds bilaterally, adequate air movement, no wheezing, rhonchi or rales. Gastrointestinal. Abdomen with distention,  no organomegaly, non tender, no rebound or guarding Skin. No rashes Musculoskeletal: no joint deformities     Data Reviewed: I have personally  reviewed following labs and imaging studies  CBC: Recent Labs  Lab 08/16/18 0915 08/17/18 0306  WBC 10.9* 10.6*  HGB 13.7 11.2*  HCT 43.1 35.4*  MCV 91.9 90.8  PLT 209 326   Basic Metabolic Panel: Recent Labs  Lab 08/16/18 0915 08/17/18 0306  NA 139 138  K 3.3* 4.0  CL 101 108  CO2 30 26  GLUCOSE 121* 136*  BUN 15 12  CREATININE 0.91 0.61  CALCIUM 9.0 8.2*   GFR: Estimated Creatinine Clearance: 83.5 mL/min (by C-G formula based on SCr of 0.61 mg/dL). Liver Function Tests: Recent Labs  Lab 08/16/18 0915  AST 16  ALT 18  ALKPHOS 54  BILITOT 1.7*  PROT 6.5  ALBUMIN 3.0*   Recent Labs  Lab 08/16/18 0915  LIPASE 30   No results for input(s): AMMONIA in the last 168 hours. Coagulation Profile: No results for input(s): INR, PROTIME in the last 168 hours. Cardiac Enzymes: No results for input(s): CKTOTAL, CKMB, CKMBINDEX, TROPONINI in the last 168 hours. BNP (last 3 results) No results for input(s): PROBNP in the last 8760 hours. HbA1C: No results for input(s): HGBA1C in the last 72 hours. CBG: No results for input(s): GLUCAP in the last 168 hours. Lipid Profile: No results for input(s): CHOL, HDL, LDLCALC, TRIG, CHOLHDL, LDLDIRECT in the last 72 hours. Thyroid Function Tests: No results for input(s): TSH, T4TOTAL, FREET4, T3FREE, THYROIDAB in the last 72 hours. Anemia Panel: No results for input(s): VITAMINB12, FOLATE, FERRITIN, TIBC, IRON, RETICCTPCT in the last 72 hours.    Radiology Studies: I have reviewed all of the imaging during this hospital visit personally     Scheduled Meds: . methylPREDNISolone (SOLU-MEDROL) injection  16 mg Intravenous Daily  . methylPREDNISolone (SOLU-MEDROL) injection  8 mg Intravenous QHS  . pyridostigmine  1 mg Intravenous Q8H   Continuous Infusions: . acetaminophen 1,000 mg (08/17/18 0504)  . lactated ringers 100 mL/hr at 08/17/18 0930  . piperacillin-tazobactam (ZOSYN)  IV 3.375 g (08/17/18 0505)     LOS: 1  day        Kamareon Sciandra Gerome Apley, MD [facility] Pager [phone]

## 2018-08-18 LAB — CBC WITH DIFFERENTIAL/PLATELET
Abs Immature Granulocytes: 0.04 10*3/uL (ref 0.00–0.07)
Basophils Absolute: 0 10*3/uL (ref 0.0–0.1)
Basophils Relative: 0 %
Eosinophils Absolute: 0 10*3/uL (ref 0.0–0.5)
Eosinophils Relative: 0 %
HCT: 35.5 % — ABNORMAL LOW (ref 39.0–52.0)
Hemoglobin: 11.1 g/dL — ABNORMAL LOW (ref 13.0–17.0)
Immature Granulocytes: 1 %
Lymphocytes Relative: 10 %
Lymphs Abs: 0.8 10*3/uL (ref 0.7–4.0)
MCH: 28.7 pg (ref 26.0–34.0)
MCHC: 31.3 g/dL (ref 30.0–36.0)
MCV: 91.7 fL (ref 80.0–100.0)
Monocytes Absolute: 0.5 10*3/uL (ref 0.1–1.0)
Monocytes Relative: 6 %
Neutro Abs: 6.7 10*3/uL (ref 1.7–7.7)
Neutrophils Relative %: 83 %
Platelets: 217 10*3/uL (ref 150–400)
RBC: 3.87 MIL/uL — ABNORMAL LOW (ref 4.22–5.81)
RDW: 15 % (ref 11.5–15.5)
WBC: 8 10*3/uL (ref 4.0–10.5)
nRBC: 0 % (ref 0.0–0.2)

## 2018-08-18 LAB — BASIC METABOLIC PANEL
Anion gap: 5 (ref 5–15)
BUN: 12 mg/dL (ref 8–23)
CO2: 28 mmol/L (ref 22–32)
Calcium: 8.4 mg/dL — ABNORMAL LOW (ref 8.9–10.3)
Chloride: 107 mmol/L (ref 98–111)
Creatinine, Ser: 0.71 mg/dL (ref 0.61–1.24)
GFR calc Af Amer: >60 mL/min (ref >60)
GFR calc non Af Amer: >60 mL/min (ref >60)
Glucose, Bld: 111 mg/dL — ABNORMAL HIGH (ref 70–99)
Potassium: 4.2 mmol/L (ref 3.5–5.1)
Sodium: 140 mmol/L (ref 135–145)

## 2018-08-18 MED ORDER — ADULT MULTIVITAMIN W/MINERALS CH
1.0000 | ORAL_TABLET | Freq: Every day | ORAL | Status: DC
  Administered 2018-08-18 – 2018-08-21 (×4): 1 via ORAL
  Filled 2018-08-18 (×4): qty 1

## 2018-08-18 MED ORDER — ENOXAPARIN SODIUM 40 MG/0.4ML ~~LOC~~ SOLN
40.0000 mg | SUBCUTANEOUS | Status: DC
  Administered 2018-08-18 – 2018-08-20 (×3): 40 mg via SUBCUTANEOUS
  Filled 2018-08-18 (×3): qty 0.4

## 2018-08-18 MED ORDER — ENSURE ENLIVE PO LIQD
237.0000 mL | Freq: Three times a day (TID) | ORAL | Status: DC
  Administered 2018-08-19 – 2018-08-21 (×6): 237 mL via ORAL

## 2018-08-18 MED ORDER — DOCUSATE SODIUM 100 MG PO CAPS: 100.0000 mg | ORAL_CAPSULE | Freq: Two times a day (BID) | ORAL | Status: DC | PRN

## 2018-08-18 MED ORDER — ENSURE ENLIVE PO LIQD
237.0000 mL | Freq: Two times a day (BID) | ORAL | Status: DC
  Administered 2018-08-18 (×2): 237 mL via ORAL

## 2018-08-18 MED ORDER — DOXAZOSIN MESYLATE 4 MG PO TABS
4.0000 mg | ORAL_TABLET | Freq: Every day | ORAL | Status: DC
  Administered 2018-08-18 – 2018-08-21 (×4): 4 mg via ORAL
  Filled 2018-08-18 (×5): qty 1

## 2018-08-18 NOTE — Progress Notes (Addendum)
PROGRESS NOTE    [name]  ZSW:[id number] DOB: Nov 29, 1940 DOA: 08/16/2018 PCP: Hulan Fess, MD    Brief Narrative:  77 year old male who presented with abdominal pain, he does have significant past medical history for myasthenia gravis, dyslipidemia and diverticulosis.  Reported 3 days of left lower quadrant abdominal pain,. Urinalysis negative for infection.  CT of the abdomen with bowel perforation and inflammatory changes involving the descending colon.  Large amount of extraluminal gas around the descending colon that tracks into the upper and central mesentery.  Changes secondary to diverticulitis with no focal fluid or abscess collection.  EKG sinus rhythm, normal axis, normal intervals.  Patient was admitted to the hospital with the working diagnosis of left lower quadrant abdominal pain due to perforated diverticulitis.   Assessment & Plan:   Active Problems:   BPH (benign prostatic hyperplasia)   Myasthenia (HCC)   Bowel perforation (HCC)   Hyperlipidemia   Hypokalemia  1. Perforated diverticulitis. Continue conservative medical therapy, IV fluids with balanced electrolyte solutions with dextrose, IV antibiotic therapy with Zosyn. Advance to full liquid diet per surgery recommendations. As needed IV analgesics and antiemetics. Continue antiacid therapy. Patient is being followed by general surgery , perforation seems to be contained at this point. Advance diet per general surgery. Will need to updated outpatient  colonoscopy once stable for procedure   2. Hypokalemia. K has been corrected, this am up to 4,0, renal function preserved with serum cr at  0.71. Will follow on renal panel in am.   3. Myasthenia Gravis. No signs of exacerbation, will continue neuro checks per unit protocol, out of bed to chair and ambulate in the hallway. Continue IV steroids equivalent to po prednisone home dose. Continue pyridostigmine.   4. Dyslipidemia. Holding atorvastatin therapy for  now.    5. BPH. Continue finesteride. Resume Cardura at a lower dose   DVT prophylaxis: enoxaparin   Code Status:  full Family Communication: I spoke with patient's wife at the bedside and all questions were addressed.  Disposition Plan/ discharge barriers:  Advancing diet, disposition per surgery, not stable for discharge today   Consultants:   Surgery   Procedures:     Antimicrobials:   IV Zosyn.     Subjective: Patient complaining of urinary frequency,would like to resume his Cardura  Objective: Vitals:   08/17/18 1300 08/17/18 2014 08/18/18 0620 08/18/18 0821  BP: 111/66 (!) 144/82 117/74 131/80  Pulse: 60 63 (!) 56 (!) 55  Resp: 17 18 18 18   Temp: (!) 97.5 F (36.4 C) 97.6 F (36.4 C) (!) 97.5 F (36.4 C) 97.7 F (36.5 C)  TempSrc: Oral Oral Oral Oral  SpO2: 98% 97% 95% 96%  Weight:      Height:        Intake/Output Summary (Last 24 hours) at 08/18/2018 0951 Last data filed at 08/18/2018 0434 Gross per 24 hour  Intake 3089.91 ml  Output 400 ml  Net 2689.91 ml   Filed Weights   08/16/18 1331  Weight: 87 kg    Examination:   General: not in pain or dyspnea  Neurology: Awake and alert, non focal  E ENT: mild pallor, no icterus, oral mucosa moist Cardiovascular: No JVD. S1-S2 present, rhythmic, no gallops, rubs, or murmurs. No lower extremity edema. Pulmonary: positive breath sounds bilaterally, adequate air movement, no wheezing, rhonchi or rales. Gastrointestinal. Abdomen with distention,  no organomegaly, non tender, no rebound or guarding Skin. No rashes Musculoskeletal: no joint deformities     Data  Reviewed: I have personally reviewed following labs and imaging studies  CBC: Recent Labs  Lab 08/16/18 0915 08/17/18 0306 08/18/18 0326  WBC 10.9* 10.6* 8.0  NEUTROABS  --   --  6.7  HGB 13.7 11.2* 11.1*  HCT 43.1 35.4* 35.5*  MCV 91.9 90.8 91.7  PLT 209 184 394   Basic Metabolic Panel: Recent Labs  Lab 08/16/18 0915  08/17/18 0306 08/18/18 0326  NA 139 138 140  K 3.3* 4.0 4.2  CL 101 108 107  CO2 30 26 28   GLUCOSE 121* 136* 111*  BUN 15 12 12   CREATININE 0.91 0.61 0.71  CALCIUM 9.0 8.2* 8.4*   GFR: Estimated Creatinine Clearance: 83.5 mL/min (by C-G formula based on SCr of 0.71 mg/dL). Liver Function Tests: Recent Labs  Lab 08/16/18 0915  AST 16  ALT 18  ALKPHOS 54  BILITOT 1.7*  PROT 6.5  ALBUMIN 3.0*   Recent Labs  Lab 08/16/18 0915  LIPASE 30   No results for input(s): AMMONIA in the last 168 hours. Coagulation Profile: No results for input(s): INR, PROTIME in the last 168 hours. Cardiac Enzymes: No results for input(s): CKTOTAL, CKMB, CKMBINDEX, TROPONINI in the last 168 hours. BNP (last 3 results) No results for input(s): PROBNP in the last 8760 hours. HbA1C: No results for input(s): HGBA1C in the last 72 hours. CBG: No results for input(s): GLUCAP in the last 168 hours. Lipid Profile: No results for input(s): CHOL, HDL, LDLCALC, TRIG, CHOLHDL, LDLDIRECT in the last 72 hours. Thyroid Function Tests: No results for input(s): TSH, T4TOTAL, FREET4, T3FREE, THYROIDAB in the last 72 hours. Anemia Panel: No results for input(s): VITAMINB12, FOLATE, FERRITIN, TIBC, IRON, RETICCTPCT in the last 72 hours.    Radiology Studies: I have reviewed all of the imaging during this hospital visit personally     Scheduled Meds: . finasteride  5 mg Oral Daily  . methylPREDNISolone (SOLU-MEDROL) injection  10 mg Intravenous QPM  . methylPREDNISolone (SOLU-MEDROL) injection  20 mg Intravenous q morning - 10a  . pyridostigmine  1 mg Intravenous Q8H   Continuous Infusions: . lactated ringers 75 mL/hr at 08/18/18 0434  . piperacillin-tazobactam (ZOSYN)  IV 3.375 g (08/18/18 0434)     LOS: 2 days        Reyne Dumas, MD [facility] Pager [phone]

## 2018-08-18 NOTE — Plan of Care (Signed)
  Problem: Education: Goal: Knowledge of General Education information will improve Description Including pain rating scale, medication(s)/side effects and non-pharmacologic comfort measures Outcome: Progressing   Problem: Health Behavior/Discharge Planning: Goal: Ability to manage health-related needs will improve Outcome: Progressing   Problem: Clinical Measurements: Goal: Ability to maintain clinical measurements within normal limits will improve Outcome: Progressing Goal: Will remain free from infection Outcome: Progressing Goal: Diagnostic test results will improve Outcome: Progressing Goal: Respiratory complications will improve Outcome: Progressing Goal: Cardiovascular complication will be avoided Outcome: Progressing   Problem: Activity: Goal: Risk for activity intolerance will decrease Outcome: Progressing   Problem: Nutrition: Goal: Adequate nutrition will be maintained Outcome: Progressing   Problem: Coping: Goal: Level of anxiety will decrease Outcome: Progressing   Problem: Elimination: Goal: Will not experience complications related to bowel motility Outcome: Progressing Goal: Will not experience complications related to urinary retention Outcome: Progressing   Problem: Pain Managment: Goal: General experience of comfort will improve Outcome: Progressing   Problem: Safety: Goal: Ability to remain free from injury will improve Outcome: Progressing   Problem: Skin Integrity: Goal: Risk for impaired skin integrity will decrease Outcome: Progressing  Forestine Chute, RN 08/18/2018

## 2018-08-18 NOTE — Progress Notes (Addendum)
Initial Nutrition Assessment  DOCUMENTATION CODES:   Not applicable  INTERVENTION:   -MVI with minerals daily -Ensure Enlive po TID, each supplement provides 350 kcal and 20 grams of protein -Provided education on dysphagia 3, low fiber diet; gave pt wife "Advanced Mechanical Soft Nutrition Therapy" and "Low Fiber Nutrition Therapy" handouts from [facility] Nutrition Care Manual  NUTRITION DIAGNOSIS:   Inadequate oral intake related to altered GI function, dysphagia as evidenced by per patient/family report, percent weight loss.  GOAL:   Patient will meet greater than or equal to 90% of their needs  MONITOR:   PO intake, Supplement acceptance, Diet advancement, Labs, Weight trends, Skin, I & O's  REASON FOR ASSESSMENT:   Malnutrition Screening Tool    ASSESSMENT:   [name] is a 77 y.o. male with medical history significant of MG, hyperlipidemia, and diverticulitis/diverticulosis.  Pt admitted with diverticulitis of descending colon with contained perforation.   Spoke with pt and wife at bedside. Pt wife reports that pt has experienced a general decline in health over the past month. Pt was hospitalized approximately one month ago and was diagnosed with myasthenia gravis. Pt had experienced many swallowing difficulties and has been working with a Astronomer at home. Pt wife reports that pt was just advanced to a dysphagia 3 diet and was progressing well with therapies. Pt consumes softer texture foods such as salisbury steak, ice cream, and macaroni and cheese. Pt wife also minces meat with a food processor and adds gravy. Additionally, pt consumes approximately 3-5 bottles of Ensure daily PTA. He is tolerating full liquids well (meal completion 95%).   Per pt wife, pt has lost about 20# within the past month, however, no records to confirm this statement. Noted pt has experienced a 8.6% wt loss over the past 6 months, which while not significant for time frame, is  concerning given pt's decreased oral intake and dysphagia.   Pt eager for lunch. Wife verbalizes plan to advance to soft diet tomorrow, with potential discharge. Pt wife requesting education on low fiber, dysphagia 3 diets, which this RD provided. Discussed importance of good meal and supplement intake to promote healing.   Medications reviewed and include solu-medrol.  Labs reviewed.   NUTRITION - FOCUSED PHYSICAL EXAM:    Most Recent Value  Orbital Region  Moderate depletion  Upper Arm Region  No depletion  Thoracic and Lumbar Region  No depletion  Buccal Region  No depletion  Temple Region  Mild depletion  Clavicle Bone Region  Mild depletion  Clavicle and Acromion Bone Region  No depletion  Scapular Bone Region  No depletion  Dorsal Hand  No depletion  Patellar Region  No depletion  Anterior Thigh Region  No depletion  Posterior Calf Region  No depletion  Edema (RD Assessment)  None  Hair  Reviewed  Eyes  Reviewed  Mouth  Reviewed  Skin  Reviewed  Nails  Reviewed       Diet Order:   Diet Order            Diet full liquid Room service appropriate? Yes; Fluid consistency: Thin  Diet effective now              EDUCATION NEEDS:   Education needs have been addressed  Skin:  Skin Assessment: Reviewed RN Assessment  Last BM:  08/17/18  Height:   Ht Readings from Last 1 Encounters:  08/16/18 6' (1.829 m)    Weight:   Wt Readings from Last 1 Encounters:  08/16/18 87 kg    Ideal Body Weight:  80.9 kg  BMI:  Body mass index is 26.01 kg/m.  Estimated Nutritional Needs:   Kcal:  2200-2400  Protein:  115-130 grams  Fluid:  2.2-2.4 L    [name] A. Jimmye Norman, RD, LDN, CDE Pager: [phone] After hours Pager: [phone]

## 2018-08-18 NOTE — Progress Notes (Signed)
[facility] Progress Note      Assessment/Plan Active Problems:   BPH (benign prostatic hyperplasia)   Myasthenia (HCC) - IVIG now on steriods   Bowel perforation (HCC)   Hyperlipidemia   Hypokalemia  Diverticulitis of descending colon with perforation - continue IV abx  FEN: FLD VTE: SCD's, lovenox ID: Zosyn 10/19>> Foley: none Follow up: TBD  DISPO: ideally he will improve without the need for surgery. Continue medical management.     LOS: 2 days    Subjective: CC: diverticulitis  No abdominal pain. No nausea, vomiting, fever or chills. No pain with BM's. Just had  BM. No issues overnight.   Objective: Vital signs in last 24 hours: Temp:  [97.5 F (36.4 C)-97.7 F (36.5 C)] 97.7 F (36.5 C) (10/21 0821) Pulse Rate:  [55-63] 55 (10/21 0821) Resp:  [17-18] 18 (10/21 0821) BP: (111-144)/(66-82) 131/80 (10/21 0821) SpO2:  [95 %-98 %] 96 % (10/21 0821) Last BM Date: 08/17/18  Intake/Output from previous day: 10/20 0701 - 10/21 0700 In: 3089.9 [P.O.:940; I.V.:1999.9; IV Piggyback:150] Out: 700 [Urine:700] Intake/Output this shift: No intake/output data recorded.  PE: Gen:  Alert, NAD, pleasant, cooperative Pulm:  Rate and effort normal Abd: Soft, ND, +BS, no HSM, very mild TTP of LLQ without guarding, no peritonitis  Skin: no rashes noted, warm and dry   Anti-infectives: Anti-infectives (From admission, onward)   Start     Dose/Rate Route Frequency Ordered Stop   08/16/18 2200  piperacillin-tazobactam (ZOSYN) IVPB 3.375 g  Status:  Discontinued     3.375 g 100 mL/hr over 30 Minutes Intravenous Every 8 hours 08/16/18 1456 08/16/18 1507   08/16/18 2000  piperacillin-tazobactam (ZOSYN) IVPB 3.375 g     3.375 g 12.5 mL/hr over 240 Minutes Intravenous Every 8 hours 08/16/18 1508     08/16/18 1330  piperacillin-tazobactam (ZOSYN) IVPB 3.375 g     3.375 g 100 mL/hr over 30 Minutes Intravenous  Once 08/16/18 1320 08/16/18 1432      Lab  Results:  Recent Labs    08/17/18 0306 08/18/18 0326  WBC 10.6* 8.0  HGB 11.2* 11.1*  HCT 35.4* 35.5*  PLT 184 217   BMET Recent Labs    08/17/18 0306 08/18/18 0326  NA 138 140  K 4.0 4.2  CL 108 107  CO2 26 28  GLUCOSE 136* 111*  BUN 12 12  CREATININE 0.61 0.71  CALCIUM 8.2* 8.4*   PT/INR No results for input(s): LABPROT, INR in the last 72 hours. CMP     Component Value Date/Time   NA 140 08/18/2018 0326   NA 143 03/08/2016 1133   NA 145 11/02/2014 1104   K 4.2 08/18/2018 0326   K 3.6 11/02/2014 1104   CL 107 08/18/2018 0326   CO2 28 08/18/2018 0326   CO2 31 (H) 11/02/2014 1104   GLUCOSE 111 (H) 08/18/2018 0326   GLUCOSE 83 11/02/2014 1104   BUN 12 08/18/2018 0326   BUN 11 03/08/2016 1133   BUN 10.0 11/02/2014 1104   CREATININE 0.71 08/18/2018 0326   CREATININE 0.8 11/02/2014 1104   CALCIUM 8.4 (L) 08/18/2018 0326   CALCIUM 9.3 11/02/2014 1104   PROT 6.5 08/16/2018 0915   PROT 6.8 03/08/2016 1133   PROT 7.1 11/02/2014 1104   ALBUMIN 3.0 (L) 08/16/2018 0915   ALBUMIN 4.2 03/08/2016 1133   ALBUMIN 3.9 11/02/2014 1104   AST 16 08/16/2018 0915   AST 21 11/02/2014 1104   ALT 18 08/16/2018 0915  ALT 24 11/02/2014 1104   ALKPHOS 54 08/16/2018 0915   ALKPHOS 79 11/02/2014 1104   BILITOT 1.7 (H) 08/16/2018 0915   BILITOT 0.9 03/08/2016 1133   BILITOT 0.85 11/02/2014 1104   GFRNONAA >60 08/18/2018 0326   GFRAA >60 08/18/2018 0326   Lipase     Component Value Date/Time   LIPASE 30 08/16/2018 0915    Studies/Results: Ct Abdomen Pelvis W Contrast  Result Date: 08/16/2018 CLINICAL DATA:  77 year old with abdominal distension and pain. EXAM: CT ABDOMEN AND PELVIS WITH CONTRAST TECHNIQUE: Multidetector CT imaging of the abdomen and pelvis was performed using the standard protocol following bolus administration of intravenous contrast. CONTRAST:  158mL OMNIPAQUE IOHEXOL 300 MG/ML  SOLN COMPARISON:  09/05/2014 FINDINGS: Lower chest: Chronic densities in the  lower lungs are most compatible with post inflammatory changes and scarring. No pleural effusions. Hepatobiliary: Again noted are multiple hypodensities in the liver that are compatible with hepatic cysts. Normal appearance of the gallbladder without biliary dilatation. The main portal venous system is patent. Pancreas: Unremarkable. No pancreatic ductal dilatation or surrounding inflammatory changes. Spleen: Normal in size without focal abnormality. Adrenals/Urinary Tract: Stable nodularity involving the lateral limb of left adrenal gland and unchanged since 2015. This is likely a benign finding based on the stability. There may be a second small nodule involving the inferior aspect of the left adrenal gland which is unchanged. Right adrenal gland is unremarkable. Several right renal cysts without hydronephrosis. Evidence for several small bladder calculi. Largest bladder calculus roughly measures 4 mm. Again noted is a large left renal sinus cyst or cysts measuring up to 7.8 cm. There is no evidence for left hydronephrosis based on the delayed imaging. No ureter dilatation. Stomach/Bowel: Extensive diverticulosis involving the left colon and sigmoid colon. There is a large amount of extraluminal gas with pericolonic edema centered around the descending colon. This is best seen on sequence 4, image 45. Large amount of gas is tracking medially into the left upper abdominal mesentery. Large amount of gas around the descending colon but there is not a discrete fluid or abscess collection. No significant colonic wall thickening. Probable appendectomy. Normal appearance of small bowel without obstruction. Stomach is unremarkable. Vascular/Lymphatic: Atherosclerotic calcifications in the abdominal aorta without aneurysm. No enlarged lymph nodes in the abdomen or pelvis. Reproductive: Prostate is enlarged, lobulated and indents the posterior base of the bladder. Prostate measures 5.9 x 5.6 x 6.4 cm. Calcifications within  the prostate gland. Other: No significant free fluid. The free air is associated with the descending colon tracks into the left abdominal mesentery. Musculoskeletal: Degenerative facet disease in lower lumbar spine. IMPRESSION: 1. Bowel perforation and inflammatory changes involving the descending colon. Large amount of extraluminal gas surrounding the descending colon that tracks into the upper and central mesentery. Based on the extensive diverticulosis, findings are likely secondary to acute diverticulitis. No focal fluid or abscess collection at this time. 2. Multiple hepatic and renal cysts. 3. Several small bladder calculi.  No hydronephrosis. 4. Prostate enlargement. These results were called by telephone at the time of interpretation on 08/16/2018 at 1:00 pm to Dr. Ronnald Nian, who verbally acknowledged these results. Electronically Signed   By: Markus Daft M.D.   On: 08/16/2018 13:01      Kalman Drape , [facility] 08/18/2018, 10:08 AM  Pager: [phone] Mon-Wed, Friday 7:00am-4:30pm Thurs 7am-11:30am  Consults: [phone]

## 2018-08-19 ENCOUNTER — Telehealth: Payer: Self-pay | Admitting: Neurology

## 2018-08-19 DIAGNOSIS — K631 Perforation of intestine (nontraumatic): Secondary | ICD-10-CM

## 2018-08-19 DIAGNOSIS — G7001 Myasthenia gravis with (acute) exacerbation: Secondary | ICD-10-CM

## 2018-08-19 DIAGNOSIS — E7800 Pure hypercholesterolemia, unspecified: Secondary | ICD-10-CM

## 2018-08-19 DIAGNOSIS — N400 Enlarged prostate without lower urinary tract symptoms: Secondary | ICD-10-CM

## 2018-08-19 LAB — COMPREHENSIVE METABOLIC PANEL
ALT: 17 U/L (ref 0–44)
AST: 14 U/L — ABNORMAL LOW (ref 15–41)
Albumin: 2.4 g/dL — ABNORMAL LOW (ref 3.5–5.0)
Alkaline Phosphatase: 45 U/L (ref 38–126)
Anion gap: 7 (ref 5–15)
BUN: 12 mg/dL (ref 8–23)
CO2: 27 mmol/L (ref 22–32)
Calcium: 8.3 mg/dL — ABNORMAL LOW (ref 8.9–10.3)
Chloride: 105 mmol/L (ref 98–111)
Creatinine, Ser: 0.75 mg/dL (ref 0.61–1.24)
GFR calc Af Amer: >60 mL/min (ref >60)
GFR calc non Af Amer: >60 mL/min (ref >60)
Glucose, Bld: 102 mg/dL — ABNORMAL HIGH (ref 70–99)
Potassium: 3.8 mmol/L (ref 3.5–5.1)
Sodium: 139 mmol/L (ref 135–145)
Total Bilirubin: 1 mg/dL (ref 0.3–1.2)
Total Protein: 5.6 g/dL — ABNORMAL LOW (ref 6.5–8.1)

## 2018-08-19 NOTE — Progress Notes (Signed)
[facility] Progress Note      Assessment/Plan Active Problems:   BPH (benign prostatic hyperplasia)   Myasthenia (HCC) - IVIG now on steriods   Bowel perforation (HCC)   Hyperlipidemia   Hypokalemia  Diverticulitis of descending colon with perforation - continue IV abx - new abdominal pain, if pain does not improve may need a repeat CT scan tomorrow or Thursday.  FEN: FLD VTE: SCD's, lovenox ID: Zosyn 10/19>> Foley: none Follow up: TBD  DISPO: ideally he will improve without the need for surgery. Continue medical management.     LOS: 3 days    Subjective: CC: diverticulitis  Left sided abdominal pain and loose stools. Pt states he tried to move out of the bed last evening and felt a sharp pain in his left side that has persisted into today. Pain about the same. Also he has had numerous BM's today that have progressed to diarrhea. No nausea, vomiting, fever or chills. No increase pain with BM's.  Objective: Vital signs in last 24 hours: Temp:  [97.8 F (36.6 C)-98.9 F (37.2 C)] 98.9 F (37.2 C) (10/22 0539) Pulse Rate:  [76-92] 90 (10/22 0539) Resp:  [14-17] 17 (10/22 0539) BP: (123-137)/(84-89) 132/84 (10/22 0539) SpO2:  [93 %-96 %] 96 % (10/22 0539) Last BM Date: 08/18/18  Intake/Output from previous day: 10/21 0701 - 10/22 0700 In: 720 [P.O.:720] Out: 900 [Urine:900] Intake/Output this shift: No intake/output data recorded.  PE: Gen:  Alert, NAD, pleasant, cooperative Pulm:  Rate and effort normal Abd: Soft, ND, +BS, no HSM, TTP of left side of abdomen with guarding, no peritonitis. No ecchymosis or deformity noted to left hemiabdomen.  Skin: no rashes noted, warm and dry  Anti-infectives: Anti-infectives (From admission, onward)   Start     Dose/Rate Route Frequency Ordered Stop   08/16/18 2200  piperacillin-tazobactam (ZOSYN) IVPB 3.375 g  Status:  Discontinued     3.375 g 100 mL/hr over 30 Minutes Intravenous Every 8 hours  08/16/18 1456 08/16/18 1507   08/16/18 2000  piperacillin-tazobactam (ZOSYN) IVPB 3.375 g     3.375 g 12.5 mL/hr over 240 Minutes Intravenous Every 8 hours 08/16/18 1508     08/16/18 1330  piperacillin-tazobactam (ZOSYN) IVPB 3.375 g     3.375 g 100 mL/hr over 30 Minutes Intravenous  Once 08/16/18 1320 08/16/18 1432      Lab Results:  Recent Labs    08/17/18 0306 08/18/18 0326  WBC 10.6* 8.0  HGB 11.2* 11.1*  HCT 35.4* 35.5*  PLT 184 217   BMET Recent Labs    08/18/18 0326 08/19/18 0244  NA 140 139  K 4.2 3.8  CL 107 105  CO2 28 27  GLUCOSE 111* 102*  BUN 12 12  CREATININE 0.71 0.75  CALCIUM 8.4* 8.3*   PT/INR No results for input(s): LABPROT, INR in the last 72 hours. CMP     Component Value Date/Time   NA 139 08/19/2018 0244   NA 143 03/08/2016 1133   NA 145 11/02/2014 1104   K 3.8 08/19/2018 0244   K 3.6 11/02/2014 1104   CL 105 08/19/2018 0244   CO2 27 08/19/2018 0244   CO2 31 (H) 11/02/2014 1104   GLUCOSE 102 (H) 08/19/2018 0244   GLUCOSE 83 11/02/2014 1104   BUN 12 08/19/2018 0244   BUN 11 03/08/2016 1133   BUN 10.0 11/02/2014 1104   CREATININE 0.75 08/19/2018 0244   CREATININE 0.8 11/02/2014 1104   CALCIUM 8.3 (L) 08/19/2018 0244  CALCIUM 9.3 11/02/2014 1104   PROT 5.6 (L) 08/19/2018 0244   PROT 6.8 03/08/2016 1133   PROT 7.1 11/02/2014 1104   ALBUMIN 2.4 (L) 08/19/2018 0244   ALBUMIN 4.2 03/08/2016 1133   ALBUMIN 3.9 11/02/2014 1104   AST 14 (L) 08/19/2018 0244   AST 21 11/02/2014 1104   ALT 17 08/19/2018 0244   ALT 24 11/02/2014 1104   ALKPHOS 45 08/19/2018 0244   ALKPHOS 79 11/02/2014 1104   BILITOT 1.0 08/19/2018 0244   BILITOT 0.9 03/08/2016 1133   BILITOT 0.85 11/02/2014 1104   GFRNONAA >60 08/19/2018 0244   GFRAA >60 08/19/2018 0244   Lipase     Component Value Date/Time   LIPASE 30 08/16/2018 0915    Studies/Results: No results found.    Kalman Drape , [facility] 08/19/2018, 9:37 AM  Pager:  [phone] Mon-Wed, Friday 7:00am-4:30pm Thurs 7am-11:30am  Consults: [phone]

## 2018-08-19 NOTE — Telephone Encounter (Signed)
Patient's wife is calling in wanting the Doctor to know that he is currently in the hospital. He is in there for his diverticulitis. She just wanted to update on what's going on. If you need to call her back it's [phone]. Thanks!

## 2018-08-19 NOTE — Telephone Encounter (Signed)
FYI

## 2018-08-19 NOTE — Care Management Important Message (Signed)
Important Message  Patient Details  Name: [name] MRN: [id number] Date of Birth: 27-Jun-1941   Medicare Important Message Given:  Yes    Audreana Hancox Montine Circle 08/19/2018, 12:35 PM

## 2018-08-19 NOTE — Plan of Care (Signed)
  Problem: Education: Goal: Knowledge of General Education information will improve Description Including pain rating scale, medication(s)/side effects and non-pharmacologic comfort measures Outcome: Progressing   Problem: Health Behavior/Discharge Planning: Goal: Ability to manage health-related needs will improve Outcome: Progressing   Problem: Clinical Measurements: Goal: Ability to maintain clinical measurements within normal limits will improve Outcome: Progressing Goal: Will remain free from infection Outcome: Progressing Goal: Diagnostic test results will improve Outcome: Progressing Goal: Respiratory complications will improve Outcome: Progressing Goal: Cardiovascular complication will be avoided Outcome: Progressing   Problem: Activity: Goal: Risk for activity intolerance will decrease Outcome: Progressing   Problem: Nutrition: Goal: Adequate nutrition will be maintained Outcome: Progressing   Problem: Coping: Goal: Level of anxiety will decrease Outcome: Progressing   Problem: Elimination: Goal: Will not experience complications related to bowel motility Outcome: Progressing Goal: Will not experience complications related to urinary retention Outcome: Progressing   Problem: Pain Managment: Goal: General experience of comfort will improve Outcome: Progressing   Problem: Safety: Goal: Ability to remain free from injury will improve Outcome: Progressing   Problem: Skin Integrity: Goal: Risk for impaired skin integrity will decrease Outcome: Progressing   Forestine Chute, RN 08/19/2018

## 2018-08-19 NOTE — Progress Notes (Signed)
PROGRESS NOTE    [name]  ZOX:[id number] DOB: Aug 09, 1941 DOA: 08/16/2018 PCP: Hulan Fess, MD   Brief Narrative:  77 year old with history of myasthenia gravis, hyperlipidemia, diverticulosis came to the hospital with complaints of abdominal pain.  CT of the abdomen pelvis showed bowel perforation of the descending colon with large amount of extraluminal gas but largely this was contained.  General surgery was consulted who recommended IV antibiotics and medical management.   Assessment & Plan:   Active Problems:   BPH (benign prostatic hyperplasia)   Myasthenia (HCC)   Bowel perforation (HCC)   Hyperlipidemia   Hypokalemia  Perforated diverticulitis, largely contained Left lower quadrant abdominal pain, persist - General surgery consulted who recommends medical management -Continue IV antibiotics, pain control, antiemetics - Diet as tolerated - Will require repeat CT abdomen pelvis if continues to have pain in next 24-48 hours otherwise can get this done outpatient in 1 week. - Will require outpatient colonoscopy in about 8-12 weeks  Myasthenia gravis -Follows with Dr. [name] outpatient, recently his prednisone dose was increased according to him.  Continue pyridostigmine.  No signs of acute exacerbation  Hyperlipidemia -Resume statin at discharge  BPH -Continue finasteride and Cardura.  DVT prophylaxis: Lovenox Code Status: Full code Family Communication: Wife at bedside Disposition Plan: Maintain inpatient stay until his abdominal symptoms have improved  Consultants:   General surgery  Procedures:   None  Antimicrobials:   Zosyn   Subjective: Patient still reports of left lower quadrant abdominal pain.  Had watery diarrhea this morning, at least 2-3 episodes.  Remains afebrile for now.  Review of Systems Otherwise negative except as per HPI, including: General: Denies fever, chills, night sweats or unintended weight loss. Resp: Denies cough,  wheezing, shortness of breath. Cardiac: Denies chest pain, palpitations, orthopnea, paroxysmal nocturnal dyspnea. GI: Denies nausea, vomitingconstipation GU: Denies dysuria, frequency, hesitancy or incontinence MS: Denies muscle aches, joint pain or swelling Neuro: Denies headache, neurologic deficits (focal weakness, numbness, tingling), abnormal gait Psych: Denies anxiety, depression, SI/HI/AVH Skin: Denies new rashes or lesions ID: Denies sick contacts, exotic exposures, travel  Objective: Vitals:   08/18/18 1332 08/18/18 2110 08/19/18 0539 08/19/18 0810  BP: 123/85 137/89 132/84 140/80  Pulse: 76 92 90 77  Resp: 14 17 17    Temp: 97.8 F (36.6 C) 98.7 F (37.1 C) 98.9 F (37.2 C) 98.2 F (36.8 C)  TempSrc: Oral Oral Oral Oral  SpO2: 95% 93% 96% 94%  Weight:      Height:        Intake/Output Summary (Last 24 hours) at 08/19/2018 1342 Last data filed at 08/19/2018 0810 Gross per 24 hour  Intake 480 ml  Output 1200 ml  Net -720 ml   Filed Weights   08/16/18 1331  Weight: 87 kg    Examination:  General exam: Appears calm and comfortable  Respiratory system: Clear to auscultation. Respiratory effort normal. Cardiovascular system: S1 & S2 heard, RRR. No JVD, murmurs, rubs, gallops or clicks. No pedal edema. Gastrointestinal system: Tender to palpation in the left lower quadrant of the abdomen, abdomen is nondistended, soft  No organomegaly or masses felt. Normal bowel sounds heard. Central nervous system: Alert and oriented. No focal neurological deficits. Extremities: Symmetric 5 x 5 power. Skin: No rashes, lesions or ulcers Psychiatry: Judgement and insight appear normal. Mood & affect appropriate.     Data Reviewed:   CBC: Recent Labs  Lab 08/16/18 0915 08/17/18 0306 08/18/18 0326  WBC 10.9* 10.6* 8.0  NEUTROABS  --   --  6.7  HGB 13.7 11.2* 11.1*  HCT 43.1 35.4* 35.5*  MCV 91.9 90.8 91.7  PLT 209 184 355   Basic Metabolic Panel: Recent Labs  Lab  08/16/18 0915 08/17/18 0306 08/18/18 0326 08/19/18 0244  NA 139 138 140 139  K 3.3* 4.0 4.2 3.8  CL 101 108 107 105  CO2 30 26 28 27   GLUCOSE 121* 136* 111* 102*  BUN 15 12 12 12   CREATININE 0.91 0.61 0.71 0.75  CALCIUM 9.0 8.2* 8.4* 8.3*   GFR: Estimated Creatinine Clearance: 83.5 mL/min (by C-G formula based on SCr of 0.75 mg/dL). Liver Function Tests: Recent Labs  Lab 08/16/18 0915 08/19/18 0244  AST 16 14*  ALT 18 17  ALKPHOS 54 45  BILITOT 1.7* 1.0  PROT 6.5 5.6*  ALBUMIN 3.0* 2.4*   Recent Labs  Lab 08/16/18 0915  LIPASE 30   No results for input(s): AMMONIA in the last 168 hours. Coagulation Profile: No results for input(s): INR, PROTIME in the last 168 hours. Cardiac Enzymes: No results for input(s): CKTOTAL, CKMB, CKMBINDEX, TROPONINI in the last 168 hours. BNP (last 3 results) No results for input(s): PROBNP in the last 8760 hours. HbA1C: No results for input(s): HGBA1C in the last 72 hours. CBG: No results for input(s): GLUCAP in the last 168 hours. Lipid Profile: No results for input(s): CHOL, HDL, LDLCALC, TRIG, CHOLHDL, LDLDIRECT in the last 72 hours. Thyroid Function Tests: No results for input(s): TSH, T4TOTAL, FREET4, T3FREE, THYROIDAB in the last 72 hours. Anemia Panel: No results for input(s): VITAMINB12, FOLATE, FERRITIN, TIBC, IRON, RETICCTPCT in the last 72 hours. Sepsis Labs: Recent Labs  Lab 08/16/18 0953  LATICACIDVEN 1.34    Recent Results (from the past 240 hour(s))  Blood culture (routine x 2)     Status: None (Preliminary result)   Collection Time: 08/16/18  1:46 PM  Result Value Ref Range Status   Specimen Description BLOOD RIGHT ANTECUBITAL  Final   Special Requests   Final    BOTTLES DRAWN AEROBIC AND ANAEROBIC Blood Culture adequate volume   Culture   Final    NO GROWTH 3 DAYS Performed at [facility], [address] [place], [place] [address]    Report Status PENDING  Incomplete  Blood culture (routine x 2)      Status: None (Preliminary result)   Collection Time: 08/16/18  2:01 PM  Result Value Ref Range Status   Specimen Description BLOOD RIGHT WRIST  Final   Special Requests   Final    BOTTLES DRAWN AEROBIC AND ANAEROBIC Blood Culture adequate volume   Culture   Final    NO GROWTH 3 DAYS Performed at [facility], [place] [address], [place], [place] [address]    Report Status PENDING  Incomplete         Radiology Studies: No results found.      Scheduled Meds: . doxazosin  4 mg Oral Daily  . enoxaparin (LOVENOX) injection  40 mg Subcutaneous Q24H  . feeding supplement (ENSURE ENLIVE)  237 mL Oral TID BM  . finasteride  5 mg Oral Daily  . methylPREDNISolone (SOLU-MEDROL) injection  10 mg Intravenous QPM  . methylPREDNISolone (SOLU-MEDROL) injection  20 mg Intravenous q morning - 10a  . multivitamin with minerals  1 tablet Oral Daily  . pyridostigmine  1 mg Intravenous Q8H   Continuous Infusions: . lactated ringers 75 mL/hr at 08/19/18 0907  . piperacillin-tazobactam (ZOSYN)  IV 3.375 g (08/19/18 1205)  LOS: 3 days   Time spent= 30 mins    Olando Willems Arsenio Loader, MD [facility] Pager [phone]   If 7PM-7AM, please contact night-coverage [email or web address] Password [email or web address] 08/19/2018, 1:42 PM

## 2018-08-19 NOTE — Plan of Care (Signed)
  Problem: Activity: Goal: Risk for activity intolerance will decrease Outcome: Progressing   Problem: Nutrition: Goal: Adequate nutrition will be maintained Outcome: Progressing   Problem: Pain Managment: Goal: General experience of comfort will improve Outcome: Progressing   

## 2018-08-20 ENCOUNTER — Encounter (HOSPITAL_COMMUNITY): Payer: Self-pay | Admitting: General Practice

## 2018-08-20 ENCOUNTER — Other Ambulatory Visit: Payer: Self-pay

## 2018-08-20 DIAGNOSIS — E860 Dehydration: Secondary | ICD-10-CM

## 2018-08-20 DIAGNOSIS — E876 Hypokalemia: Secondary | ICD-10-CM

## 2018-08-20 DIAGNOSIS — R197 Diarrhea, unspecified: Secondary | ICD-10-CM

## 2018-08-20 LAB — CBC
HCT: 36 % — ABNORMAL LOW (ref 39.0–52.0)
Hemoglobin: 11.5 g/dL — ABNORMAL LOW (ref 13.0–17.0)
MCH: 28.9 pg (ref 26.0–34.0)
MCHC: 31.9 g/dL (ref 30.0–36.0)
MCV: 90.5 fL (ref 80.0–100.0)
Platelets: 246 10*3/uL (ref 150–400)
RBC: 3.98 MIL/uL — ABNORMAL LOW (ref 4.22–5.81)
RDW: 14.7 % (ref 11.5–15.5)
WBC: 8.9 10*3/uL (ref 4.0–10.5)
nRBC: 0 % (ref 0.0–0.2)

## 2018-08-20 MED ORDER — SACCHAROMYCES BOULARDII 250 MG PO CAPS
250.0000 mg | ORAL_CAPSULE | Freq: Two times a day (BID) | ORAL | Status: DC
  Administered 2018-08-20 – 2018-08-21 (×3): 250 mg via ORAL
  Filled 2018-08-20 (×3): qty 1

## 2018-08-20 MED ORDER — OXYCODONE HCL 5 MG PO TABS: 5.0000 mg | ORAL_TABLET | ORAL | Status: DC | PRN

## 2018-08-20 MED ORDER — ACETAMINOPHEN 325 MG PO TABS: 650.0000 mg | ORAL_TABLET | Freq: Four times a day (QID) | ORAL | Status: DC | PRN

## 2018-08-20 MED ORDER — PREDNISONE 20 MG PO TABS
30.0000 mg | ORAL_TABLET | Freq: Every day | ORAL | Status: DC
  Administered 2018-08-21: 20 mg via ORAL
  Filled 2018-08-20: qty 1

## 2018-08-20 MED ORDER — MORPHINE SULFATE (PF) 2 MG/ML IV SOLN: 1.0000 mg | INTRAVENOUS | Status: DC | PRN

## 2018-08-20 NOTE — Progress Notes (Signed)
[facility] Progress Note     Subjective: CC-  Sitting up in bed eating breakfast, wife at bedside. States that he still feels somewhat lousy but is improving. Abdominal pain is less, only bothers him with palpation. Eating full liquids without increased abdominal pain. Denies n/v. Reports multiple loose BMs.  WBC 8.9, VSS.  Objective: Vital signs in last 24 hours: Temp:  [97.7 F (36.5 C)-98 F (36.7 C)] 97.7 F (36.5 C) (10/23 0445) Pulse Rate:  [63-66] 66 (10/23 0445) Resp:  [18] 18 (10/23 0445) BP: (139)/(83-88) 139/83 (10/23 0445) SpO2:  [94 %] 94 % (10/23 0445) Last BM Date: 08/19/18  Intake/Output from previous day: 10/22 0701 - 10/23 0700 In: 2848.4 [I.V.:2500; IV Piggyback:348.3] Out: 300 [Urine:300] Intake/Output this shift: No intake/output data recorded.  PE: Gen:  Alert, NAD, pleasant HEENT: EOM's intact, pupils equal and round Card:  RRR Pulm:  effort normal, trace expiratory wheezing Abd: Soft, ND, +BS, no HSM, mild subjective TTP LLQ without rebound or guarding Ext:  Calves soft and nontender without edema Psych: A&Ox3  Skin: no rashes noted, warm and dry  Lab Results:  Recent Labs    08/18/18 0326 08/20/18 0206  WBC 8.0 8.9  HGB 11.1* 11.5*  HCT 35.5* 36.0*  PLT 217 246   BMET Recent Labs    08/18/18 0326 08/19/18 0244  NA 140 139  K 4.2 3.8  CL 107 105  CO2 28 27  GLUCOSE 111* 102*  BUN 12 12  CREATININE 0.71 0.75  CALCIUM 8.4* 8.3*   PT/INR No results for input(s): LABPROT, INR in the last 72 hours. CMP     Component Value Date/Time   NA 139 08/19/2018 0244   NA 143 03/08/2016 1133   NA 145 11/02/2014 1104   K 3.8 08/19/2018 0244   K 3.6 11/02/2014 1104   CL 105 08/19/2018 0244   CO2 27 08/19/2018 0244   CO2 31 (H) 11/02/2014 1104   GLUCOSE 102 (H) 08/19/2018 0244   GLUCOSE 83 11/02/2014 1104   BUN 12 08/19/2018 0244   BUN 11 03/08/2016 1133   BUN 10.0 11/02/2014 1104   CREATININE 0.75 08/19/2018 0244   CREATININE 0.8 11/02/2014 1104   CALCIUM 8.3 (L) 08/19/2018 0244   CALCIUM 9.3 11/02/2014 1104   PROT 5.6 (L) 08/19/2018 0244   PROT 6.8 03/08/2016 1133   PROT 7.1 11/02/2014 1104   ALBUMIN 2.4 (L) 08/19/2018 0244   ALBUMIN 4.2 03/08/2016 1133   ALBUMIN 3.9 11/02/2014 1104   AST 14 (L) 08/19/2018 0244   AST 21 11/02/2014 1104   ALT 17 08/19/2018 0244   ALT 24 11/02/2014 1104   ALKPHOS 45 08/19/2018 0244   ALKPHOS 79 11/02/2014 1104   BILITOT 1.0 08/19/2018 0244   BILITOT 0.9 03/08/2016 1133   BILITOT 0.85 11/02/2014 1104   GFRNONAA >60 08/19/2018 0244   GFRAA >60 08/19/2018 0244   Lipase     Component Value Date/Time   LIPASE 30 08/16/2018 0915       Studies/Results: No results found.  Anti-infectives: Anti-infectives (From admission, onward)   Start     Dose/Rate Route Frequency Ordered Stop   08/16/18 2200  piperacillin-tazobactam (ZOSYN) IVPB 3.375 g  Status:  Discontinued     3.375 g 100 mL/hr over 30 Minutes Intravenous Every 8 hours 08/16/18 1456 08/16/18 1507   08/16/18 2000  piperacillin-tazobactam (ZOSYN) IVPB 3.375 g     3.375 g 12.5 mL/hr over 240 Minutes Intravenous Every 8 hours 08/16/18 1508  08/16/18 1330  piperacillin-tazobactam (ZOSYN) IVPB 3.375 g     3.375 g 100 mL/hr over 30 Minutes Intravenous  Once 08/16/18 1320 08/16/18 1432       Assessment/Plan BPH HLD Myasthenia gravis - on prednisone  Diverticulitis of descending colon with perforation - Abdominal pain improving, WBC WNL, VSS, patient tolerating fulls and having bowel function. Advance to dysphagia 3 diet with low residue foods (needs puree foods due to myasthenia gravis). Add probiotic. Encourage ambulation. If patient tolerates diet he is stable for discharge on oral antibiotics from a surgical standpoint. Will arrange follow up in OP surgery clinic with colorectal surgeon. Also needs colonoscopy in about 8 weeks.  LVD:[id number] 3 (low residue) VTE: SCD's, lovenox AE:WYBRK  10/19>> Foley:none Follow up:TBD   LOS: 4 days    [name] , [facility] 08/20/2018, 10:07 AM Pager: [phone] Mon 7:00 am -11:30 AM Tues-Fri 7:00 am-4:30 pm Sat-Sun 7:00 am-11:30 am

## 2018-08-20 NOTE — Progress Notes (Signed)
PROGRESS NOTE    [name]  YBW:[id number] DOB: 08-Dec-1940 DOA: 08/16/2018 PCP: Hulan Fess, MD   Brief Narrative:  77 year old with history of myasthenia gravis, hyperlipidemia, diverticulosis came to the hospital with complaints of abdominal pain.  CT of the abdomen pelvis showed bowel perforation of the descending colon with large amount of extraluminal gas but largely this was contained.  General surgery was consulted who recommended IV antibiotics and medical management.  Patient will need outpatient colorectal surgeon follow-up and colonoscopy in about 8 weeks.   Assessment & Plan:   Active Problems:   BPH (benign prostatic hyperplasia)   Myasthenia (HCC)   Bowel perforation (HCC)   Hyperlipidemia   Hypokalemia  Perforated diverticulitis, largely contained Left lower quadrant abdominal pain, persist Watery diarrhea with clinical signs of dehydration, mild to moderate - Continue medical management per general surgery.  Continue IV antibiotics today, will transition to oral tomorrow-Augmentin -Pain control, antiemetics.  Diet as tolerated - Will eventually require repeat CT of the abdomen and pelvis in about 1-2 weeks and follow with outpatient general surgery/colorectal surgeon. -Recommend outpatient colonoscopy in about 8 weeks - Continue dysphagia 3 diet.  Probiotics added. - Continue lactated Ringer at 75 cc/h  Myasthenia gravis -Spoke with Dr. [name] from neurology outpatient who is an outpatient neurologist for the patient.  Advised to continue prednisone 30 mg daily, will discontinue IV Solu-Medrol.  Hyperlipidemia -Resume statin at discharge  BPH -Continue finasteride and Cardura.  DVT prophylaxis: Lovenox Code Status: Full code Family Communication: Wife at bedside Disposition Plan: Maintain hospital stay for another 24 hours.  Consultants:   General surgery  Procedures:   None  Antimicrobials:   Zosyn   Subjective: Still having multiple  episodes of diarrhea, at least had 10 in the last 12 hours.  States his left lower quadrant abdominal pain is slightly better.  Review of Systems Otherwise negative except as per HPI, including: General = no fevers, chills, dizziness, malaise, fatigue HEENT/EYES = negative for pain, redness, loss of vision, double vision, blurred vision, loss of hearing, sore throat, hoarseness, dysphagia Cardiovascular= negative for chest pain, palpitation, murmurs, lower extremity swelling Respiratory/lungs= negative for shortness of breath, cough, hemoptysis, wheezing, mucus production Gastrointestinal= negative for nausea, vomiting, melena, hematemesis Genitourinary= negative for Dysuria, Hematuria, Change in Urinary Frequency MSK = Negative for arthralgia, myalgias, Back Pain, Joint swelling  Neurology= Negative for headache, seizures, numbness, tingling  Psychiatry= Negative for anxiety, depression, suicidal and homocidal ideation Allergy/Immunology= Medication/Food allergy as listed  Skin= Negative for Rash, lesions, ulcers, itching   Objective: Vitals:   08/19/18 0539 08/19/18 0810 08/19/18 2227 08/20/18 0445  BP: 132/84 140/80 139/88 139/83  Pulse: 90 77 63 66  Resp: 17  18 18   Temp: 98.9 F (37.2 C) 98.2 F (36.8 C) 98 F (36.7 C) 97.7 F (36.5 C)  TempSrc: Oral Oral Oral Oral  SpO2: 96% 94% 94% 94%  Weight:      Height:        Intake/Output Summary (Last 24 hours) at 08/20/2018 1123 Last data filed at 08/20/2018 0000 Gross per 24 hour  Intake 2848.35 ml  Output -  Net 7681.35 ml   Filed Weights   08/16/18 1331  Weight: 87 kg    Examination:  Constitutional: NAD, calm, comfortable Eyes: PERRL, lids and conjunctivae normal ENMT: Mucous membranes are moist. Posterior pharynx clear of any exudate or lesions.Normal dentition.  Neck: normal, supple, no masses, no thyromegaly Respiratory: clear to auscultation bilaterally, no wheezing, no crackles. Normal  respiratory effort. No  accessory muscle use.  Cardiovascular: Regular rate and rhythm, no murmurs / rubs / gallops. No extremity edema. 2+ pedal pulses. No carotid bruits.  Abdomen: Slight tenderness to LLQ, no masses palpated. No hepatosplenomegaly. Bowel sounds positive.  Musculoskeletal: no clubbing / cyanosis. No joint deformity upper and lower extremities. Good ROM, no contractures. Normal muscle tone.  Skin: no rashes, lesions, ulcers. No induration Neurologic: CN 2-12 grossly intact. Sensation intact, DTR normal. Strength 5/5 in all 4.  Psychiatric: Normal judgment and insight. Alert and oriented x 3. Normal mood.     Data Reviewed:   CBC: Recent Labs  Lab 08/16/18 0915 08/17/18 0306 08/18/18 0326 08/20/18 0206  WBC 10.9* 10.6* 8.0 8.9  NEUTROABS  --   --  6.7  --   HGB 13.7 11.2* 11.1* 11.5*  HCT 43.1 35.4* 35.5* 36.0*  MCV 91.9 90.8 91.7 90.5  PLT 209 184 217 470   Basic Metabolic Panel: Recent Labs  Lab 08/16/18 0915 08/17/18 0306 08/18/18 0326 08/19/18 0244  NA 139 138 140 139  K 3.3* 4.0 4.2 3.8  CL 101 108 107 105  CO2 30 26 28 27   GLUCOSE 121* 136* 111* 102*  BUN 15 12 12 12   CREATININE 0.91 0.61 0.71 0.75  CALCIUM 9.0 8.2* 8.4* 8.3*   GFR: Estimated Creatinine Clearance: 83.5 mL/min (by C-G formula based on SCr of 0.75 mg/dL). Liver Function Tests: Recent Labs  Lab 08/16/18 0915 08/19/18 0244  AST 16 14*  ALT 18 17  ALKPHOS 54 45  BILITOT 1.7* 1.0  PROT 6.5 5.6*  ALBUMIN 3.0* 2.4*   Recent Labs  Lab 08/16/18 0915  LIPASE 30   No results for input(s): AMMONIA in the last 168 hours. Coagulation Profile: No results for input(s): INR, PROTIME in the last 168 hours. Cardiac Enzymes: No results for input(s): CKTOTAL, CKMB, CKMBINDEX, TROPONINI in the last 168 hours. BNP (last 3 results) No results for input(s): PROBNP in the last 8760 hours. HbA1C: No results for input(s): HGBA1C in the last 72 hours. CBG: No results for input(s): GLUCAP in the last 168  hours. Lipid Profile: No results for input(s): CHOL, HDL, LDLCALC, TRIG, CHOLHDL, LDLDIRECT in the last 72 hours. Thyroid Function Tests: No results for input(s): TSH, T4TOTAL, FREET4, T3FREE, THYROIDAB in the last 72 hours. Anemia Panel: No results for input(s): VITAMINB12, FOLATE, FERRITIN, TIBC, IRON, RETICCTPCT in the last 72 hours. Sepsis Labs: Recent Labs  Lab 08/16/18 0953  LATICACIDVEN 1.34    Recent Results (from the past 240 hour(s))  Blood culture (routine x 2)     Status: None (Preliminary result)   Collection Time: 08/16/18  1:46 PM  Result Value Ref Range Status   Specimen Description BLOOD RIGHT ANTECUBITAL  Final   Special Requests   Final    BOTTLES DRAWN AEROBIC AND ANAEROBIC Blood Culture adequate volume   Culture   Final    NO GROWTH 4 DAYS Performed at [facility], [address] [place], [place] [address]    Report Status PENDING  Incomplete  Blood culture (routine x 2)     Status: None (Preliminary result)   Collection Time: 08/16/18  2:01 PM  Result Value Ref Range Status   Specimen Description BLOOD RIGHT WRIST  Final   Special Requests   Final    BOTTLES DRAWN AEROBIC AND ANAEROBIC Blood Culture adequate volume   Culture   Final    NO GROWTH 4 DAYS Performed at [facility], [place] [address], [place], [place] [address]    Report Status PENDING  Incomplete         Radiology Studies: No results found.      Scheduled Meds: . doxazosin  4 mg Oral Daily  . enoxaparin (LOVENOX) injection  40 mg Subcutaneous Q24H  . feeding supplement (ENSURE ENLIVE)  237 mL Oral TID BM  . finasteride  5 mg Oral Daily  . methylPREDNISolone (SOLU-MEDROL) injection  10 mg Intravenous QPM  . methylPREDNISolone (SOLU-MEDROL) injection  20 mg Intravenous q morning - 10a  . multivitamin with minerals  1 tablet Oral Daily  . pyridostigmine  1 mg Intravenous Q8H  . saccharomyces boulardii  250 mg Oral BID   Continuous Infusions: . lactated ringers  75 mL/hr at 08/20/18 0000  . piperacillin-tazobactam (ZOSYN)  IV 3.375 g (08/20/18 0445)     LOS: 4 days   Time spent= 25 mins    Kynsli Haapala Arsenio Loader, MD [facility] Pager [phone]   If 7PM-7AM, please contact night-coverage [email or web address] Password [email or web address] 08/20/2018, 11:23 AM

## 2018-08-21 DIAGNOSIS — G7000 Myasthenia gravis without (acute) exacerbation: Secondary | ICD-10-CM

## 2018-08-21 LAB — CULTURE, BLOOD (ROUTINE X 2)
Culture: NO GROWTH
Culture: NO GROWTH
Special Requests: ADEQUATE
Special Requests: ADEQUATE

## 2018-08-21 MED ORDER — METRONIDAZOLE 500 MG PO TABS: 500.0000 mg | ORAL_TABLET | Freq: Three times a day (TID) | ORAL | 0 refills | Status: AC

## 2018-08-21 MED ORDER — CIPROFLOXACIN HCL 500 MG PO TABS: 500.0000 mg | ORAL_TABLET | Freq: Two times a day (BID) | ORAL | 0 refills | Status: AC

## 2018-08-21 MED ORDER — PREDNISONE 10 MG PO TABS: 30.0000 mg | ORAL_TABLET | Freq: Every day | ORAL | 0 refills | Status: DC

## 2018-08-21 MED ORDER — SACCHAROMYCES BOULARDII 250 MG PO CAPS: 250.0000 mg | ORAL_CAPSULE | Freq: Two times a day (BID) | ORAL | 0 refills | Status: AC

## 2018-08-21 NOTE — Progress Notes (Signed)
[facility] Progress Note     Subjective: CC:  No new complaints. Denies pain. Tolerating PO. Diarrhea improved and stools are more formed. Non-bloody. Mobilizing and eager for discharge   Objective: Vital signs in last 24 hours: Temp:  [97.9 F (36.6 C)-98.2 F (36.8 C)] 97.9 F (36.6 C) (10/24 0629) Pulse Rate:  [62-96] 62 (10/24 0629) Resp:  [18] 18 (10/24 0629) BP: (131-144)/(78-88) 144/88 (10/24 0629) SpO2:  [94 %-97 %] 97 % (10/24 0629) Last BM Date: 08/20/18  Intake/Output from previous day: 10/23 0701 - 10/24 0700 In: 1180 [P.O.:280; I.V.:750; IV Piggyback:150] Out: 100 [Urine:100] Intake/Output this shift: No intake/output data recorded.  PE: Gen:  Alert, NAD, pleasant Card:  Regular rate and rhythm, pedal pulses 2+ BL Pulm:  Normal effort, clear to auscultation bilaterally Abd: Soft, protuberant, +BS, nontender  Skin: warm and dry, no rashes  Psych: A&Ox3   Lab Results:  Recent Labs    08/20/18 0206  WBC 8.9  HGB 11.5*  HCT 36.0*  PLT 246   BMET Recent Labs    08/19/18 0244  NA 139  K 3.8  CL 105  CO2 27  GLUCOSE 102*  BUN 12  CREATININE 0.75  CALCIUM 8.3*   PT/INR No results for input(s): LABPROT, INR in the last 72 hours. CMP     Component Value Date/Time   NA 139 08/19/2018 0244   NA 143 03/08/2016 1133   NA 145 11/02/2014 1104   K 3.8 08/19/2018 0244   K 3.6 11/02/2014 1104   CL 105 08/19/2018 0244   CO2 27 08/19/2018 0244   CO2 31 (H) 11/02/2014 1104   GLUCOSE 102 (H) 08/19/2018 0244   GLUCOSE 83 11/02/2014 1104   BUN 12 08/19/2018 0244   BUN 11 03/08/2016 1133   BUN 10.0 11/02/2014 1104   CREATININE 0.75 08/19/2018 0244   CREATININE 0.8 11/02/2014 1104   CALCIUM 8.3 (L) 08/19/2018 0244   CALCIUM 9.3 11/02/2014 1104   PROT 5.6 (L) 08/19/2018 0244   PROT 6.8 03/08/2016 1133   PROT 7.1 11/02/2014 1104   ALBUMIN 2.4 (L) 08/19/2018 0244   ALBUMIN 4.2 03/08/2016 1133   ALBUMIN 3.9 11/02/2014 1104   AST 14 (L)  08/19/2018 0244   AST 21 11/02/2014 1104   ALT 17 08/19/2018 0244   ALT 24 11/02/2014 1104   ALKPHOS 45 08/19/2018 0244   ALKPHOS 79 11/02/2014 1104   BILITOT 1.0 08/19/2018 0244   BILITOT 0.9 03/08/2016 1133   BILITOT 0.85 11/02/2014 1104   GFRNONAA >60 08/19/2018 0244   GFRAA >60 08/19/2018 0244   Lipase     Component Value Date/Time   LIPASE 30 08/16/2018 0915       Studies/Results: No results found.  Anti-infectives: Anti-infectives (From admission, onward)   Start     Dose/Rate Route Frequency Ordered Stop   08/16/18 2200  piperacillin-tazobactam (ZOSYN) IVPB 3.375 g  Status:  Discontinued     3.375 g 100 mL/hr over 30 Minutes Intravenous Every 8 hours 08/16/18 1456 08/16/18 1507   08/16/18 2000  piperacillin-tazobactam (ZOSYN) IVPB 3.375 g     3.375 g 12.5 mL/hr over 240 Minutes Intravenous Every 8 hours 08/16/18 1508     08/16/18 1330  piperacillin-tazobactam (ZOSYN) IVPB 3.375 g     3.375 g 100 mL/hr over 30 Minutes Intravenous  Once 08/16/18 1320 08/16/18 1432     Assessment/Plan BPH HLD Myasthenia gravis - on prednisone  Diverticulitis of descending colon with perforation - Abdominal pain  improving, WBC WNL, VSS, patient tolerating diet and having bowel function.  Encourage ambulation. If patient tolerates diet he is stable for discharge today on oral antibiotics from a surgical standpoint.  OLM:[id number] 3 (low residue) VTE: SCD's, lovenox EF:EOFHQ 10/19>> Foley:none Follow up:Dr. [name];  needs colonoscopy in about 8 weeks.   LOS: 5 days    Obie Dredge, [facility] Pager: [phone]

## 2018-08-21 NOTE — Discharge Summary (Signed)
Physician Discharge Summary  [name] OFB:[id number] DOB: July 12, 1941 DOA: 08/16/2018  PCP: Hulan Fess, MD  Admit date: 08/16/2018 Discharge date: 08/21/2018  Admitted From: Home Disposition: Home  Recommendations for Outpatient Follow-up:  1. Follow up with PCP in 1 weeks 2. Please obtain BMP/CBC in one week your next doctors visit.  3. Take Cipro and Flagyl orally for 7 more days.  Florastor has been prescribed 4. At the request of patient's outpatient neurology Dr. [name], patient will be discharged on prednisone 30 mg daily which was started outpatient recently 5. Follow-up outpatient with general surgery in about 10 days and at that time determination will be made if patient needs a repeat CT of the abdomen pelvis or not 6. Will need colonoscopy in about 8-12 weeks  Home Health: None Equipment/Devices: None Discharge Condition: Stable CODE STATUS: Full code Diet recommendation: As tolerated  Brief/Interim Summary: 77 year old with history of myasthenia gravis, hyperlipidemia, diverticulosis came to the hospital with complaints of abdominal pain.  CT of the abdomen pelvis showed bowel perforation of the descending colon with large amount of extraluminal gas but largely this was contained.  General surgery was consulted who recommended IV antibiotics and medical management.  Patient will need outpatient colorectal surgeon follow-up and colonoscopy in about 8 weeks.  Over the course of several days patient's abdominal pain significantly improved and his diarrhea started improving as well.  He was advised to get outpatient colonoscopy in about 8-12 weeks. At this time we will discharge the patient on Cipro and Flagyl for total of 7 days.  We will also prescribe probiotic. Also spoke with patient's outpatient neurologist Dr. [name] who recommended discharging patient on prednisone 30 mg daily as this was prescribed just few days prior to the admission.  At this time he is reached  maximum benefit from an hospital stay and stable to be discharged with outpatient follow-up recommendations as stated above.   Discharge Diagnoses:  Active Problems:   BPH (benign prostatic hyperplasia)   Myasthenia (HCC)   Bowel perforation (HCC)   Hyperlipidemia   Hypokalemia  Perforated diverticulitis, largely contained, greatly improved Left lower quadrant abdominal pain, greatly improved Watery diarrhea with clinical signs of dehydration, mild to moderate -  At this point patient's symptoms have greatly improved and his diarrhea is improving as well.  He remains afebrile and WBC remained stable.  We will transition his IV Zosyn to oral Cipro and Flagyl and complete the course.  Will need repeat colonoscopy in about 8-12 weeks.  Will prescribe Florastor -Need to follow-up outpatient with general surgery in about 10 days.  He also needs to follow-up outpatient with primary care provider.  Myasthenia gravis -Spoke with Dr. [name] from neurology outpatient who is an outpatient neurologist for the patient.  Advised to continue prednisone 30 mg daily  Hyperlipidemia -Resume statin at discharge   BPH -Finasteride and Cardura  He was a Lovenox while he was here Patient is full code Wife at bedside Discharge today  Discharge Instructions   Allergies as of 08/21/2018      Reactions   Pollen Extract Other (See Comments)   Nasal congestion      Medication List    TAKE these medications   aspirin EC 81 MG tablet Take 1 tablet (81 mg total) by mouth daily.   atorvastatin 10 MG tablet Commonly known as:  LIPITOR Take 1 tablet (10 mg total) by mouth daily at 6 PM.   CALCIUM 600+D 600-400 MG-UNIT tablet Generic drug:  Calcium  Carbonate-Vitamin D Take 1 tablet by mouth daily.   doxazosin 8 MG tablet Commonly known as:  CARDURA Take 8 mg by mouth daily.   feeding supplement (ENSURE ENLIVE) Liqd Take 237 mLs by mouth 3 (three) times daily between meals.   finasteride 5  MG tablet Commonly known as:  PROSCAR Take 5 mg by mouth daily.   Glucosamine HCl 1500 MG Tabs Take 1,500 mg by mouth daily.   ibuprofen 200 MG tablet Commonly known as:  ADVIL,MOTRIN Take 200 mg by mouth every 6 (six) hours as needed for headache (pain).   multivitamin with minerals Tabs tablet Take 1 tablet by mouth daily.   predniSONE 10 MG tablet Commonly known as:  DELTASONE Take 3 tablets (30 mg total) by mouth daily with breakfast. Start taking on:  08/22/2018 What changed:    how much to take  when to take this   pyridostigmine 60 MG tablet Commonly known as:  MESTINON Take 0.5 tablets (30 mg total) by mouth 3 (three) times daily. Take half tablet at 8am, 1pm, and 6pm What changed:  additional instructions   saccharomyces boulardii 250 MG capsule Commonly known as:  FLORASTOR Take 1 capsule (250 mg total) by mouth 2 (two) times daily for 14 days.   VITAMIN B-12 PO Take 1 tablet by mouth daily.      Follow-up Information    [name], MD. Go on 09/01/2018.   Specialty:  General Surgery Why:  Your appointment is 09/01/18 at 8:45 am, to follow up with one of our colorectal surgeons. Please arrive 15 mintues prior to your appointment to check in and fill out paperwork. Contact information: [address] [place] [address] [phone]        Hulan Fess, MD. Schedule an appointment as soon as possible for a visit in 1 week(s).   Specialty:  Family Medicine Contact information: [place] [address] [phone]          Allergies  Allergen Reactions  . Pollen Extract Other (See Comments)    Nasal congestion    You were cared for by a hospitalist during your hospital stay. If you have any questions about your discharge medications or the care you received while you were in the hospital after you are discharged, you can call the unit and asked to speak with the hospitalist on call if the hospitalist that  took care of you is not available. Once you are discharged, your primary care physician will handle any further medical issues. Please note that no refills for any discharge medications will be authorized once you are discharged, as it is imperative that you return to your primary care physician (or establish a relationship with a primary care physician if you do not have one) for your aftercare needs so that they can reassess your need for medications and monitor your lab values.  Consultations:  [facility]   Procedures/Studies: Ct Abdomen Pelvis W Contrast  Result Date: 08/16/2018 CLINICAL DATA:  76 year old with abdominal distension and pain. EXAM: CT ABDOMEN AND PELVIS WITH CONTRAST TECHNIQUE: Multidetector CT imaging of the abdomen and pelvis was performed using the standard protocol following bolus administration of intravenous contrast. CONTRAST:  141mL OMNIPAQUE IOHEXOL 300 MG/ML  SOLN COMPARISON:  09/05/2014 FINDINGS: Lower chest: Chronic densities in the lower lungs are most compatible with post inflammatory changes and scarring. No pleural effusions. Hepatobiliary: Again noted are multiple hypodensities in the liver that are compatible with hepatic cysts. Normal  appearance of the gallbladder without biliary dilatation. The main portal venous system is patent. Pancreas: Unremarkable. No pancreatic ductal dilatation or surrounding inflammatory changes. Spleen: Normal in size without focal abnormality. Adrenals/Urinary Tract: Stable nodularity involving the lateral limb of left adrenal gland and unchanged since 2015. This is likely a benign finding based on the stability. There may be a second small nodule involving the inferior aspect of the left adrenal gland which is unchanged. Right adrenal gland is unremarkable. Several right renal cysts without hydronephrosis. Evidence for several small bladder calculi. Largest bladder calculus roughly measures 4 mm. Again noted is a large left renal  sinus cyst or cysts measuring up to 7.8 cm. There is no evidence for left hydronephrosis based on the delayed imaging. No ureter dilatation. Stomach/Bowel: Extensive diverticulosis involving the left colon and sigmoid colon. There is a large amount of extraluminal gas with pericolonic edema centered around the descending colon. This is best seen on sequence 4, image 45. Large amount of gas is tracking medially into the left upper abdominal mesentery. Large amount of gas around the descending colon but there is not a discrete fluid or abscess collection. No significant colonic wall thickening. Probable appendectomy. Normal appearance of small bowel without obstruction. Stomach is unremarkable. Vascular/Lymphatic: Atherosclerotic calcifications in the abdominal aorta without aneurysm. No enlarged lymph nodes in the abdomen or pelvis. Reproductive: Prostate is enlarged, lobulated and indents the posterior base of the bladder. Prostate measures 5.9 x 5.6 x 6.4 cm. Calcifications within the prostate gland. Other: No significant free fluid. The free air is associated with the descending colon tracks into the left abdominal mesentery. Musculoskeletal: Degenerative facet disease in lower lumbar spine. IMPRESSION: 1. Bowel perforation and inflammatory changes involving the descending colon. Large amount of extraluminal gas surrounding the descending colon that tracks into the upper and central mesentery. Based on the extensive diverticulosis, findings are likely secondary to acute diverticulitis. No focal fluid or abscess collection at this time. 2. Multiple hepatic and renal cysts. 3. Several small bladder calculi.  No hydronephrosis. 4. Prostate enlargement. These results were called by telephone at the time of interpretation on 08/16/2018 at 1:00 pm to Dr. Ronnald Nian, who verbally acknowledged these results. Electronically Signed   By: Markus Daft M.D.   On: 08/16/2018 13:01      Subjective: No complaints, stools are  more formed today.  Denies any abdominal pain.  General = no fevers, chills, dizziness, malaise, fatigue HEENT/EYES = negative for pain, redness, loss of vision, double vision, blurred vision, loss of hearing, sore throat, hoarseness, dysphagia Cardiovascular= negative for chest pain, palpitation, murmurs, lower extremity swelling Respiratory/lungs= negative for shortness of breath, cough, hemoptysis, wheezing, mucus production Gastrointestinal= negative for nausea, vomiting,, abdominal pain, melena, hematemesis Genitourinary= negative for Dysuria, Hematuria, Change in Urinary Frequency MSK = Negative for arthralgia, myalgias, Back Pain, Joint swelling  Neurology= Negative for headache, seizures, numbness, tingling  Psychiatry= Negative for anxiety, depression, suicidal and homocidal ideation Allergy/Immunology= Medication/Food allergy as listed  Skin= Negative for Rash, lesions, ulcers, itching    Discharge Exam: Vitals:   08/20/18 2158 08/21/18 0629  BP: (!) 142/87 (!) 144/88  Pulse: 67 62  Resp: 18 18  Temp: 98.2 F (36.8 C) 97.9 F (36.6 C)  SpO2: 97% 97%   Vitals:   08/20/18 0445 08/20/18 1447 08/20/18 2158 08/21/18 0629  BP: 139/83 131/78 (!) 142/87 (!) 144/88  Pulse: 66 96 67 62  Resp: 18 18 18 18   Temp: 97.7 F (36.5 C) 98.2 F (  36.8 C) 98.2 F (36.8 C) 97.9 F (36.6 C)  TempSrc: Oral Oral Oral Oral  SpO2: 94% 94% 97% 97%  Weight:      Height:        General: Pt is alert, awake, not in acute distress Cardiovascular: RRR, S1/S2 +, no rubs, no gallops Respiratory: CTA bilaterally, no wheezing, no rhonchi Abdominal: Soft, NT, ND, bowel sounds + Extremities: no edema, no cyanosis    The results of significant diagnostics from this hospitalization (including imaging, microbiology, ancillary and laboratory) are listed below for reference.     Microbiology: Recent Results (from the past 240 hour(s))  Blood culture (routine x 2)     Status: None (Preliminary  result)   Collection Time: 08/16/18  1:46 PM  Result Value Ref Range Status   Specimen Description BLOOD RIGHT ANTECUBITAL  Final   Special Requests   Final    BOTTLES DRAWN AEROBIC AND ANAEROBIC Blood Culture adequate volume   Culture   Final    NO GROWTH 4 DAYS Performed at [facility], [address], [place], [place] [address]    Report Status PENDING  Incomplete  Blood culture (routine x 2)     Status: None (Preliminary result)   Collection Time: 08/16/18  2:01 PM  Result Value Ref Range Status   Specimen Description BLOOD RIGHT WRIST  Final   Special Requests   Final    BOTTLES DRAWN AEROBIC AND ANAEROBIC Blood Culture adequate volume   Culture   Final    NO GROWTH 4 DAYS Performed at [facility], [place] [address] [place], [place] [address]    Report Status PENDING  Incomplete     Labs: BNP (last 3 results) No results for input(s): BNP in the last 8760 hours. Basic Metabolic Panel: Recent Labs  Lab 08/16/18 0915 08/17/18 0306 08/18/18 0326 08/19/18 0244  NA 139 138 140 139  K 3.3* 4.0 4.2 3.8  CL 101 108 107 105  CO2 30 26 28 27   GLUCOSE 121* 136* 111* 102*  BUN 15 12 12 12   CREATININE 0.91 0.61 0.71 0.75  CALCIUM 9.0 8.2* 8.4* 8.3*   Liver Function Tests: Recent Labs  Lab 08/16/18 0915 08/19/18 0244  AST 16 14*  ALT 18 17  ALKPHOS 54 45  BILITOT 1.7* 1.0  PROT 6.5 5.6*  ALBUMIN 3.0* 2.4*   Recent Labs  Lab 08/16/18 0915  LIPASE 30   No results for input(s): AMMONIA in the last 168 hours. CBC: Recent Labs  Lab 08/16/18 0915 08/17/18 0306 08/18/18 0326 08/20/18 0206  WBC 10.9* 10.6* 8.0 8.9  NEUTROABS  --   --  6.7  --   HGB 13.7 11.2* 11.1* 11.5*  HCT 43.1 35.4* 35.5* 36.0*  MCV 91.9 90.8 91.7 90.5  PLT 209 184 217 246   Cardiac Enzymes: No results for input(s): CKTOTAL, CKMB, CKMBINDEX, TROPONINI in the last 168 hours. BNP: Invalid input(s): POCBNP CBG: No results for input(s): GLUCAP in the last 168  hours. D-Dimer No results for input(s): DDIMER in the last 72 hours. Hgb A1c No results for input(s): HGBA1C in the last 72 hours. Lipid Profile No results for input(s): CHOL, HDL, LDLCALC, TRIG, CHOLHDL, LDLDIRECT in the last 72 hours. Thyroid function studies No results for input(s): TSH, T4TOTAL, T3FREE, THYROIDAB in the last 72 hours.  Invalid input(s): FREET3 Anemia work up No results for input(s): VITAMINB12, FOLATE, FERRITIN, TIBC, IRON, RETICCTPCT in the last 72 hours. Urinalysis  Component Value Date/Time   COLORURINE YELLOW 08/16/2018 1409   APPEARANCEUR HAZY (A) 08/16/2018 1409   LABSPEC 1.038 (H) 08/16/2018 1409   PHURINE 9.0 (H) 08/16/2018 1409   GLUCOSEU NEGATIVE 08/16/2018 1409   HGBUR NEGATIVE 08/16/2018 1409   BILIRUBINUR NEGATIVE 08/16/2018 1409   KETONESUR NEGATIVE 08/16/2018 1409   PROTEINUR NEGATIVE 08/16/2018 1409   UROBILINOGEN 0.2 09/05/2014 1218   NITRITE NEGATIVE 08/16/2018 1409   LEUKOCYTESUR NEGATIVE 08/16/2018 1409   Sepsis Labs Invalid input(s): PROCALCITONIN,  WBC,  LACTICIDVEN Microbiology Recent Results (from the past 240 hour(s))  Blood culture (routine x 2)     Status: None (Preliminary result)   Collection Time: 08/16/18  1:46 PM  Result Value Ref Range Status   Specimen Description BLOOD RIGHT ANTECUBITAL  Final   Special Requests   Final    BOTTLES DRAWN AEROBIC AND ANAEROBIC Blood Culture adequate volume   Culture   Final    NO GROWTH 4 DAYS Performed at [facility], [place] [address], [place], [place] [address]    Report Status PENDING  Incomplete  Blood culture (routine x 2)     Status: None (Preliminary result)   Collection Time: 08/16/18  2:01 PM  Result Value Ref Range Status   Specimen Description BLOOD RIGHT WRIST  Final   Special Requests   Final    BOTTLES DRAWN AEROBIC AND ANAEROBIC Blood Culture adequate volume   Culture   Final    NO GROWTH 4 DAYS Performed at [facility], [place] [address]  [place], [place] [address]    Report Status PENDING  Incomplete     Time coordinating discharge:  I have spent 35 minutes face to face with the patient and on the ward discussing the patients care, assessment, plan and disposition with other care givers. >50% of the time was devoted counseling the patient about the risks and benefits of treatment/Discharge disposition and coordinating care.   SIGNED:   Damita Lack, MD  [facility] 08/21/2018, 10:19 AM Pager   If 7PM-7AM, please contact night-coverage [email or web address] Password [email or web address]

## 2018-08-28 DIAGNOSIS — Z8719 Personal history of other diseases of the digestive system: Secondary | ICD-10-CM | POA: Diagnosis not present

## 2018-08-28 DIAGNOSIS — R109 Unspecified abdominal pain: Secondary | ICD-10-CM | POA: Diagnosis not present

## 2018-08-28 DIAGNOSIS — E8809 Other disorders of plasma-protein metabolism, not elsewhere classified: Secondary | ICD-10-CM | POA: Diagnosis not present

## 2018-09-01 ENCOUNTER — Ambulatory Visit: Payer: Self-pay | Admitting: Surgery

## 2018-09-01 DIAGNOSIS — K5732 Diverticulitis of large intestine without perforation or abscess without bleeding: Secondary | ICD-10-CM | POA: Diagnosis not present

## 2018-09-01 NOTE — H&P (Signed)
[name] Documented: 09/01/2018 8:56 AM Location: [facility] Patient #: [id number] DOB: [birth year] Married / Language: English / Race: White Male  History of Present Illness Douglas Hector MD; 09/01/2018 11:20 AM) The patient is a 77 year old male who presents with diverticulitis. Note for "Diverticulitis": ` ` ` Patient sent for surgical consultation at the request of Dr [name]  Chief Complaint: Recurrent diverticulitis. Recent abscess. 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 Recently on steroids. Had been on 20 mg twice a day Prednisone. Followed by neurology., hyperlipidemia. 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 Was admitted 10/19. Was in the hospital 5 days with an abscess. Seemed to improve clinically. The to 30 mg twice a day of prednisone. Transitioned to Cipro/Flagyl.  He comes in today with his wife. Abdominal pain is gone away. However he feels like he has some discomfort in the right lower side. Heart he had an appendectomy. No other surgeries. Moving his bowels better. Not is loose. Becoming more solid. He continues on a probiotic. He normally walks about 30 minutes a day. It has been thrown out of whack with his diagnosis of myasthenia since August. That presented with difficulty with swallowing and weakness. Unintentional 20 pound weight loss. That is improved on the medical treatment. Patient had a similar episode of diverticulitis with an abscess about 5 years ago. Was in the hospital for 5 days. He thinks he had a few attacks before that as well.  No personal nor family history of GI/colon cancer, inflammatory bowel disease, irritable bowel syndrome, allergy such as Celiac Sprue, dietary/dairy problems, colitis, ulcers nor gastritis. No recent sick contacts/gastroenteritis. No travel outside the country. No changes in  diet. No dysphagia to solids or liquids. No significant heartburn or reflux. No hematochezia, hematemesis, coffee ground emesis. No evidence of prior gastric/peptic ulceration.  (Review of systems as stated in this history (HPI) or in the review of systems. Otherwise all other 12 point ROS are negative) ` ` `   Allergies [name], CMA; 09/01/2018 8:57 AM) No Known Drug Allergies [08/30/2014]: Allergies Reconciled  Medication History [name], CMA; 09/01/2018 8:58 AM) Atorvastatin Calcium (80MG  Tablet, Oral) Active. Finasteride (5MG  Tablet, Oral daily) Active. Metamucil (Oral) Active. Gabapentin (400MG  Capsule, Oral daily) Active. Naproxen (500MG  Tablet, Oral daily) Active. Rapaflo (8MG  Capsule, Oral daily) Active. Proctosol HC (2.5% Cream, Rectal as needed) Active. Medications Reconciled    Vitals [name] CMA; 09/01/2018 8:59 AM) 09/01/2018 8:58 AM Weight: 191 lb Height: 72in Body Surface Area: 2.09 m Body Mass Index: 25.9 kg/m  Temp.: 97.75F(Oral)  Pulse: 113 (Regular)  BP: 110/72 (Sitting, Left Arm, Standard)      Physical Exam Douglas Hector MD; 09/01/2018 9:41 AM)  General Mental Status-Alert. General Appearance-Not in acute distress, Not Sickly. Orientation-Oriented X3. Hydration-Well hydrated. Voice-Normal.  Integumentary Global Assessment Upon inspection and palpation of skin surfaces of the - Axillae: non-tender, no inflammation or ulceration, no drainage. and Distribution of scalp and body hair is normal. General Characteristics Temperature - normal warmth is noted.  Head and Neck Head-normocephalic, atraumatic with no lesions or palpable masses. Face Global Assessment - atraumatic, no absence of expression. Neck Global Assessment - no abnormal movements, no bruit auscultated on the right, no bruit auscultated on the left, no decreased range of motion,  non-tender. Trachea-midline. Thyroid Gland Characteristics - non-tender.  Eye Eyeball - Left-Extraocular movements intact, No Nystagmus. Eyeball -  Right-Extraocular movements intact, No Nystagmus. Cornea - Left-No Hazy. Cornea - Right-No Hazy. Sclera/Conjunctiva - Left-No scleral icterus, No Discharge. Sclera/Conjunctiva - Right-No scleral icterus, No Discharge. Pupil - Left-Direct reaction to light normal. Pupil - Right-Direct reaction to light normal. Note: Wears glasses. Vision corrected  ENMT Ears Pinna - Left - no drainage observed, no generalized tenderness observed. Right - no drainage observed, no generalized tenderness observed. Nose and Sinuses External Inspection of the Nose - no destructive lesion observed. Inspection of the nares - Left - quiet respiration. Right - quiet respiration. Mouth and Throat Lips - Upper Lip - no fissures observed, no pallor noted. Lower Lip - no fissures observed, no pallor noted. Nasopharynx - no discharge present. Oral Cavity/Oropharynx - Tongue - no dryness observed. Oral Mucosa - no cyanosis observed. Hypopharynx - no evidence of airway distress observed. Note: Hard of hearing  Chest and Lung Exam Inspection Movements - Normal and Symmetrical. Accessory muscles - No use of accessory muscles in breathing. Palpation Palpation of the chest reveals - Non-tender. Auscultation Breath sounds - Normal and Clear.  Cardiovascular Auscultation Rhythm - Regular. Murmurs & Other Heart Sounds - Auscultation of the heart reveals - No Murmurs and No Systolic Clicks.  Abdomen Inspection Inspection of the abdomen reveals - No Visible peristalsis and No Abnormal pulsations. Umbilicus - No Bleeding, No Urine drainage. Palpation/Percussion Palpation and Percussion of the abdomen reveal - Soft, Non Tender, No Rebound tenderness, No Rigidity (guarding) and No Cutaneous hyperesthesia. Note: Abdomen soft. Nontender. Not distended.  No umbilical or incisional hernias. No guarding.  Male Genitourinary Sexual Maturity Tanner 5 - Adult hair pattern and Adult penile size and shape. Note: Abdomen soft. Mild discomfort in right lower abdomen without any major guarding. No severe tenderness at left flank any more. Thin abdominal wall with moderate diastases recti. Overweight Nontender, nondistended. No guarding. No diastasis. No umbilical nor other hernias  Peripheral Vascular Upper Extremity Inspection - Left - No Cyanotic nailbeds, Not Ischemic. Right - No Cyanotic nailbeds, Not Ischemic.  Neurologic Neurologic evaluation reveals -normal attention span and ability to concentrate, able to name objects and repeat phrases. Appropriate fund of knowledge , normal sensation and normal coordination. Mental Status Affect - not angry, not paranoid. Cranial Nerves-Normal Bilaterally. Gait-Normal.  Neuropsychiatric Mental status exam performed with findings of-able to articulate well with normal speech/language, rate, volume and coherence, thought content normal with ability to perform basic computations and apply abstract reasoning and no evidence of hallucinations, delusions, obsessions or homicidal/suicidal ideation.  Musculoskeletal Global Assessment Spine, Ribs and Pelvis - no instability, subluxation or laxity. Right Upper Extremity - no instability, subluxation or laxity.  Lymphatic Head & Neck  General Head & Neck Lymphatics: Bilateral - Description - No Localized lymphadenopathy. Axillary  General Axillary Region: Bilateral - Description - No Localized lymphadenopathy. Femoral & Inguinal  Generalized Femoral & Inguinal Lymphatics: Left - Description - No Localized lymphadenopathy. Right - Description - No Localized lymphadenopathy.    Assessment & Plan Douglas Hector MD; 09/01/2018 11:28 AM)  DIVERTICULITIS OF LARGE INTESTINE WITHOUT PERFORATION OR ABSCESS WITHOUT BLEEDING (K57.32) Impression:  Recurrent diverticulitis. Prior attack ~ 2014 in [place] with abscess. Now with major perforation. Clinically improving on antibiotics.  He still has some pain but now RLQ. With him being immunosuppressed, repeat CT scan to rule out abscess. If it is there, percutaneous drainage and leave in place until surgery since risk of fistula rather high.  I think he would benefit from surgical resection at some point.  I would wait > 6-8 weeks from this attack to allow inflammation be minimized. Minimally invasive robotic sigmoid colectomy.  He is on IV immunoglobulin and steroids for his myasthenia gravis. I would like neurology clearance to see if they can back off on his prednisone a little bit if possible. Lower the risk of leak/ostomy/hernia/wound infection.  I would like cardiac clearance. Had normal echocardiogram 05/2018, but I'm pretty certain anesthesia willwant this.  I would like gastroenterology to re-scope him since its been several years from last attack. Rule out cancer. Rule out other problems in the colon. Ideally could time a day before surgery. He is working to set up appointment with Dr. [name] with gastroenterology.  Current Plans Follow Up - Call [facility] office after tests / studies doneto discuss further plans Pt Education - [facility] Free Text Education/Instructions: discussed with patient and provided information. CT ABDOMEN AND PELVIS W CONTRAST [phone]) (Pt needs CT A/P w/contrast to eval. for diverticulitis/abdominal pain. Pt is getting scheduled for surgery in Jan.2020 to have diverticulitis sx.)  PREOP COLON - ENCOUNTER FOR PREOPERATIVE EXAMINATION FOR GENERAL SURGICAL PROCEDURE (Z01.818)  Current Plans You are being scheduled for surgery- Our schedulers will call you.  You should hear from our office's scheduling department within 5 working days about the location, date, and time of surgery. We try to make accommodations for patient's preferences in scheduling surgery, but  sometimes the OR schedule or the surgeon's schedule prevents [place] from making those accommodations.  If you have not heard from our office [phone]) in 5 working days, call the office and ask for your surgeon's nurse.  If you have other questions about your diagnosis, plan, or surgery, call the office and ask for your surgeon's nurse.  Written instructions provided The anatomy & physiology of the digestive tract was discussed. The pathophysiology of the colon was discussed. Natural history risks without surgery was discussed. I feel the risks of no intervention will lead to serious problems that outweigh the operative risks; therefore, I recommended a partial colectomy to remove the pathology. Minimally invasive (Robotic/Laparoscopic) & open techniques were discussed.  Risks such as bleeding, infection, abscess, leak, reoperation, possible ostomy, hernia, heart attack, death, and other risks were discussed. I noted a good likelihood this will help address the problem. Goals of post-operative recovery were discussed as well. Need for adequate nutrition, daily bowel regimen and healthy physical activity, to optimize recovery was noted as well. We will work to minimize complications. Educational materials were available as well. Questions were answered. The patient expresses understanding & wishes to proceed with surgery.  Pt Education - [facility] Colon Bowel Prep 2018 ERAS/Miralax/Antibiotics Started Neomycin Sulfate 500 MG Oral Tablet, 2 (two) Tablet SEE NOTE, #6, 09/01/2018, No Refill. Local Order: TAKE TWO TABLETS AT 2 PM, 3 PM, AND 10 PM THE DAY PRIOR TO SURGERY Started metroNIDAZOLE 500 MG Oral Tablet, 2 (two) Tablet SEE NOTE, #6, 09/01/2018, No Refill. Local Order: Take at 2pm, 3pm, and 10pm the day prior to your colon operation Pt Education - Pamphlet Given - Laparoscopic Colorectal Surgery: discussed with patient and provided information. Pt Education - [facility] Colectomy post-op  instructions: discussed with patient and provided information.   Douglas Hector, MD, FACS, MASCRS Gastrointestinal and Minimally Invasive Surgery    [address] [place], [place] [address] [phone] Main / Paging [phone] Fax

## 2018-09-03 ENCOUNTER — Other Ambulatory Visit: Payer: Self-pay | Admitting: Surgery

## 2018-09-03 ENCOUNTER — Ambulatory Visit
Admission: RE | Admit: 2018-09-03 | Discharge: 2018-09-03 | Disposition: A | Discharge status: Home / Self Care | Payer: Medicare Other | Source: Ambulatory Visit | Attending: Surgery | Admitting: Surgery

## 2018-09-03 DIAGNOSIS — K5720 Diverticulitis of large intestine with perforation and abscess without bleeding: Secondary | ICD-10-CM | POA: Diagnosis not present

## 2018-09-03 DIAGNOSIS — K5732 Diverticulitis of large intestine without perforation or abscess without bleeding: Secondary | ICD-10-CM

## 2018-09-03 DIAGNOSIS — K5730 Diverticulosis of large intestine without perforation or abscess without bleeding: Secondary | ICD-10-CM | POA: Diagnosis not present

## 2018-09-03 MED ORDER — IOPAMIDOL (ISOVUE-300) INJECTION 61%
100.0000 mL | Freq: Once | INTRAVENOUS | Status: AC | PRN
  Administered 2018-09-03: 100 mL via INTRAVENOUS

## 2018-09-04 ENCOUNTER — Other Ambulatory Visit: Payer: Self-pay | Admitting: Surgery

## 2018-09-04 DIAGNOSIS — K5732 Diverticulitis of large intestine without perforation or abscess without bleeding: Secondary | ICD-10-CM

## 2018-09-05 ENCOUNTER — Telehealth: Payer: Self-pay

## 2018-09-05 NOTE — Telephone Encounter (Signed)
   [facility] Pre-operative Risk Assessment    Request for surgical clearance:  1. What type of surgery is being performed?  Robotic Sigmoid Colectomy   2. When is this surgery scheduled?  TBD   3. What type of clearance is required (medical clearance vs. Pharmacy clearance to hold med vs. Both)?  MEDICAL  4. Are there any medications that need to be held prior to surgery and how long?    5. Practice name and name of physician performing surgery?  [facility]/ Dr [name]   6. What is your office phone number [phone]    7.   What is your office fax number [phone]  8.   Anesthesia type (None, local, MAC, general) ?  general   Frederik Schmidt 09/05/2018, 10:13 AM  _________________________________________________________________   (provider comments below)

## 2018-09-05 NOTE — Telephone Encounter (Signed)
Called and left detailed message re: surgical clearance and that he would need to call and make an appt before he can be cleared. When pt calls, he will considered new pt since he hasn't been seen since 2012.

## 2018-09-05 NOTE — Telephone Encounter (Signed)
   Primary Cardiologist:No primary care provider on file. Remotely seen by Dr. [name] in 2012.  Chart reviewed as part of pre-operative protocol coverage.   Last OV 01/2011 for SVT, advised to f/u only as needed.  Because of Sajid Ruppert past medical history and time since last visit, he will require a follow-up visit in order to better assess preoperative cardiovascular risk.  Pre-op covering staff: - Please contact requesting surgeon's office via preferred method (i.e, phone, fax) to inform them that patient is not actively followed by [facility] - if they would like [place] to clear patient for surgery, they will need appointment beforehand - If surgeon's office still wants clearance, please arrange appointment   Charlie Pitter, PA-C  09/05/2018, 11:59 AM

## 2018-09-05 NOTE — Telephone Encounter (Signed)
Tried calling [facility], surgeon's office, noone answered in the nurse room. Sent Dr. Clyda Greener CMA, Illene Regulus, a staff message to call me re: pt's clearance.

## 2018-09-08 NOTE — Telephone Encounter (Signed)
Pt is scheduled to see Dr. Marlou Porch 10/09/18.

## 2018-09-09 DIAGNOSIS — H35413 Lattice degeneration of retina, bilateral: Secondary | ICD-10-CM | POA: Diagnosis not present

## 2018-09-09 DIAGNOSIS — H2513 Age-related nuclear cataract, bilateral: Secondary | ICD-10-CM | POA: Diagnosis not present

## 2018-09-09 DIAGNOSIS — H43811 Vitreous degeneration, right eye: Secondary | ICD-10-CM | POA: Diagnosis not present

## 2018-09-16 ENCOUNTER — Ambulatory Visit (INDEPENDENT_AMBULATORY_CARE_PROVIDER_SITE_OTHER): Payer: Medicare Other | Admitting: Neurology

## 2018-09-16 ENCOUNTER — Encounter: Payer: Self-pay | Admitting: Neurology

## 2018-09-16 VITALS — BP 138/80 | HR 83 | Ht 72.0 in | Wt 194.5 lb

## 2018-09-16 DIAGNOSIS — G7000 Myasthenia gravis without (acute) exacerbation: Secondary | ICD-10-CM

## 2018-09-16 MED ORDER — PREDNISONE 10 MG PO TABS: ORAL_TABLET | ORAL | 0 refills | Status: DC

## 2018-09-16 NOTE — Patient Instructions (Addendum)
Reduce prednisone to 25mg  for 2 weeks, then decrease 20mg  for 2 weeks, then prednisone 15mg  daily until you see me again.  Continue mestinon 30mg  three times daily  Please call my office if you develop any weakness and we will start IVIG  I will see you back in December

## 2018-09-16 NOTE — Progress Notes (Signed)
Follow-up Visit   Date: 09/16/18    [name] MRN: [id number] DOB: [birth year]   Interim History: [name] is a 77 y.o. right-handed Caucasian male with hyperlipidemia, COPD, peripheral neuropathy, SVT s/p ablation, BPH, former smoker, and recent bowel perforation due to diverticulitis returning to the clinic for follow-up of myasthenia gravis.  The patient was accompanied to the clinic by wife who also provides collateral information.    History of present illness: Patient was diagnosed with myasthenia gravis in August 2019 after presenting with left ptosis, facial weakness, double vision, and dysphagia. He went to the ER for these symptoms where stroke was excluded and AChR antibodies returned positive. He was started on mestinon 30mg  three times daily (8am, 4pm, and midnight).  He was started on prednisone 20mg , with no improvement.  He was hospitalized from 9/19 - 07/23/2018 with MG exacerbated and treated with IVIG with great response.   He also complains of severe neck pain which is worse with prolonged standing and neck extension.  He usually has to sit back down and rest for relief. He does not have numbness/tingling of the hands or weakness.   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  CSF testing was normal and genetic testing was normal.    UPDATE 08/06/2018:  Overall, he is doing well. He is able to eat better but still has fluctuating weakness with swallowing harder textured foods, especially in the morning.  He also has feeling of "looseness" around the lips.  No slurred speech, droopiness of the eyes, double vision, or limb weakness.  He remains on prednisone 20mg /d and mestinon 30mg  TID.  PA for IVIG is in process. He also complains of increased congestion at nighttime and dull headache at night.  UPDATE 09/16/2018:  Since his last visit, he has been hospitalized  with diverticulitis of the colon and bowel perforation and completed antibiotic treatment.  Fortunately, during his illness, he did not have worsening MG symptoms.  He remains on prednisone 30mg  and mestinon 30mg  TID and denies new weakness.  He does very mild droopiness of the eye, but no double vision, difficulty swallowing, or facial weakness.  He is being evaluated for bowel resection by Dr. [name] who has requested neurology clearance.     Medications:  Current Outpatient Medications on File Prior to Visit  Medication Sig Dispense Refill  . aspirin 81 MG tablet Take 1 tablet (81 mg total) by mouth daily.    Marland Kitchen atorvastatin (LIPITOR) 10 MG tablet Take 1 tablet (10 mg total) by mouth daily at 6 PM. 30 tablet 5  . Calcium Carbonate-Vitamin D (CALCIUM 600+D) 600-400 MG-UNIT per tablet Take 1 tablet by mouth daily.      . Cyanocobalamin (VITAMIN B-12 PO) Take 1 tablet by mouth daily.    Marland Kitchen doxazosin (CARDURA) 8 MG tablet Take 8 mg by mouth daily.    . feeding supplement, ENSURE ENLIVE, (ENSURE ENLIVE) LIQD Take 237 mLs by mouth 3 (three) times daily between meals. 60 Bottle 60  . finasteride (PROSCAR) 5 MG tablet Take 5 mg by mouth daily.   2  . Glucosamine HCl 1500 MG TABS Take 1,500 mg by mouth daily.    Marland Kitchen ibuprofen (ADVIL,MOTRIN) 200 MG tablet Take 200 mg by mouth every 6 (six) hours as needed for headache (pain).    . Multiple Vitamin (MULTIVITAMIN WITH MINERALS) TABS tablet Take 1 tablet by mouth daily.    Marland Kitchen  pyridostigmine (MESTINON) 60 MG tablet Take 0.5 tablets (30 mg total) by mouth 3 (three) times daily. Take half tablet at 8am, 1pm, and 6pm (Patient taking differently: Take 30 mg by mouth 3 (three) times daily. Take 30 mg tablet at 8am, 1pm, and 6pm) 135 tablet 3   No current facility-administered medications on file prior to visit.     Allergies:  Allergies  Allergen Reactions  . Pollen Extract Other (See Comments)    Nasal congestion    Review of Systems:  CONSTITUTIONAL: No  fevers, chills, night sweats, or weight loss.  EYES: No visual changes or eye pain ENT: No hearing changes.  No history of nose bleeds.   RESPIRATORY: No cough, wheezing and shortness of breath.   CARDIOVASCULAR: Negative for chest pain, and palpitations.   GI: +for abdominal discomfort, blood in stools or black stools.  No recent change in bowel habits.   GU:  No history of incontinence.   MUSCLOSKELETAL: No history of joint pain or swelling.  No myalgias.   SKIN: Negative for lesions, rash, and itching.   ENDOCRINE: Negative for cold or heat intolerance, polydipsia or goiter.   PSYCH:  No depression or anxiety symptoms.   NEURO: As Above.   Vital Signs:  BP 138/80   Pulse 83   Ht 6' (1.829 m)   Wt 194 lb 8 oz (88.2 kg)   SpO2 92%   BMI 26.38 kg/m   General Medical Exam:   General:  Well appearing, comfortable  Eyes/ENT: see cranial nerve examination.   Neck: No masses appreciated.  Full range of motion without tenderness.  No carotid bruits. Respiratory:  Clear to auscultation, good air entry bilaterally.   Cardiac:  Regular rate and rhythm, no murmur.   Ext:  No edema  Neurological Exam: MENTAL STATUS including orientation to time, place, person, recent and remote memory, attention span and concentration, language, and fund of knowledge is normal.  Speech is not dysarthric.  CRANIAL NERVES:   Pupils equal round and reactive to light.  Normal conjugate, extra-ocular eye movements in all directions of gaze.  Mild right ptosis, without worsening with sustained upgaze.  Face is symmetric.  Orbicularis oculi, buccinator is 5/5, there is trace weakness with orbicularis oris.  Palate elevates symmetrically.  Tongue is midline, strength is 5/5.  MOTOR:  Motor strength is 5/5 in all extremities, without fatigability.  No atrophy, fasciculations or abnormal movements.  No pronator drift.  Tone is normal.    COORDINATION/GAIT:    Gait narrow based and stable.   Data: LAbs 06/15/2018:   AChR binding 1.69*, MUSK neg  MRI brain wo contrast 06/14/2018: Negative for acute infarct. 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  CTA head and neck 06/14/2018: 1. Negative for large vessel occlusion, dissection, or arterial stenosis in the head or neck. There is minimal atherosclerosis. 2. Positive for generalized arterial tortuosity. Ectatic distal aortic arch and proximal descending aorta. 3. A small 15 mm area of ground-glass opacity in the right upper lobe appears stable since 2015. 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  NCS/EMG of the legs 03/13/2018:  This is an abnormal study. There is electrophysiologic evidence oflength-dependent, moderately-severe, axonal sensory-motor polyneuropathy.  CT chest 07/11/2018:  Negative for thymoma  MRI cervical spine wo contrast 07/14/2018: IMPRESSION: 1. No acute finding. 2. Disc and facet degeneration causes foraminal impingement that is advanced on the left at c3-4 and on the left at C5-6. 3. Mild  right foraminal narrowing at C5-6 and C6-7.  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  He was hospitalized from 9/19 - 9/25 with MG exacerbation and responded well to IVIG.  Since this time, he has been well-controlled on prednisone 30mg /d and mestinon 30mg  TID.  Exam shows only mild right ptosis.   Unfortunately, he had a set back from bowel perforation and given that he may need surgical resection, I will taper his prednisone by 5mg  every 2 weeks and hope to stay on prednisone 10mg /d.  He has been very responsive to steroids in the past, so will need to be cautious.  We discussed side effects of corticosteroids, including poor wound healing, and the need to try to keep him on the lowest effective dose of prednisone.  If he develops any breakthrough weakness, I will start maintenance therapy with monthy IVIG 1mg /kg,  if needed.   Continue mestinon 30mg  three times daily (8am, 1pm, and 6pm) Plan on starting azathioprine when clinically stable.  I will see him back in 6 weeks  Thank you for allowing me to participate in patient's care.  If I can answer any additional questions, I would be pleased to do so.    Sincerely,    Velda Wendt K. [name], DO

## 2018-09-17 DIAGNOSIS — Z23 Encounter for immunization: Secondary | ICD-10-CM | POA: Diagnosis not present

## 2018-09-19 ENCOUNTER — Ambulatory Visit: Payer: Medicare Other | Admitting: Neurology

## 2018-09-19 ENCOUNTER — Encounter

## 2018-10-03 DIAGNOSIS — K5720 Diverticulitis of large intestine with perforation and abscess without bleeding: Secondary | ICD-10-CM | POA: Diagnosis not present

## 2018-10-14 ENCOUNTER — Ambulatory Visit (INDEPENDENT_AMBULATORY_CARE_PROVIDER_SITE_OTHER): Payer: Medicare Other | Admitting: Cardiology

## 2018-10-14 ENCOUNTER — Encounter: Payer: Self-pay | Admitting: Cardiology

## 2018-10-14 VITALS — BP 118/66 | HR 81 | Ht 72.0 in | Wt 195.4 lb

## 2018-10-14 DIAGNOSIS — I471 Supraventricular tachycardia: Secondary | ICD-10-CM

## 2018-10-14 DIAGNOSIS — J438 Other emphysema: Secondary | ICD-10-CM

## 2018-10-14 DIAGNOSIS — Z01810 Encounter for preprocedural cardiovascular examination: Secondary | ICD-10-CM | POA: Diagnosis not present

## 2018-10-14 NOTE — Patient Instructions (Signed)
Medication Instructions:  Your provider recommends that you continue on your current medications as directed. Please refer to the Current Medication list given to you today.    Labwork: None  Testing/Procedures: None  Follow-Up: Your provider recommends that you schedule a follow-up appointment AS NEEDED with Cardiology. Call us if you need anything!  Any Other Special Instructions Will Be Listed Below (If Applicable). You have been cleared for surgery!

## 2018-10-14 NOTE — Progress Notes (Signed)
Cardiology Office Note:    Date:  10/14/2018   ID:  [name], DOB Feb 02, 1941, MRN [id number]  PCP:  Hulan Fess, MD  Cardiologist:  No primary care provider on file.  Electrophysiologist:  None   Referring MD: Hulan Fess, MD     History of Present Illness:    [name] is a 77 y.o. male here for preoperative cardiac risk assessment prior to surgery at the request of Dr. Rex Kras.  Colectomy. Dr. [name]. 2nd time with divertic.   In 2012, was seen by Dr. Cristopher Peru with electrophysiology and underwent catheter ablation of PSVT.  Was doing quite well at that time.  Did have some nocturnal palpitations.  He denies any chest pain fevers chills nausea vomiting syncope bleeding orthopnea PND bruises rashes.  By Dr. Hulan Fess on 08/28/2018.  Was discharged from the hospital on August 21, 2018 with abdominal pain CT showed bowel perforation of descending colon was felt secondary to diverticulitis perforation was contained surgery was not needed at that time.  Cipro and Flagyl.  Prednisone has also been taking for Dr. [name] for myasthenia gravis.  He is taking aspirin 81.  No other cardiac meds.  As far as family history, there is 1 brother who died of a stroke and had coronary artery disease otherwise no early family history of coronary artery disease present.  Retired Press photographer.  Quit 2002 1 pack/day smoking.  He is able to complete greater than 4 METS of activity.  Lives in a house with stairs.  No anginal symptoms.  Does have coughing sometimes at night, clear sputum production, wife hears wheezing.  Past Medical History:  Diagnosis Date  . Colon polyp   . Diverticulitis   . Diverticulosis    extensive 3'15.  . Dysrhythmia    hx. Ablation for tachycardia, no routine cardiology visits now  . ED (erectile dysfunction)   . Heart murmur    teenager  . History of colon polyps   . History of kidney stones    x1 passed  . Hyperlipidemia   . Myasthenia gravis ([place])     . Neuromuscular disorder ([place])    "neuroma"   . Osteoarthritis   . Palpitation    with svt rate 150  . Tendinitis    achiles heel spur  . Testicular atrophy   . Tubular adenoma of colon     Past Surgical History:  Procedure Laterality Date  . ABLATION  2010   cardiac   . APPENDECTOMY    . COLONOSCOPY    . EUS N/A 11/17/2014   Procedure: ESOPHAGEAL ENDOSCOPIC ULTRASOUND (EUS) RADIAL;  Surgeon: [name], MD;  Location: [facility];  Service: Endoscopy;  Laterality: N/A;  . FINE NEEDLE ASPIRATION N/A 11/17/2014   Procedure: FINE NEEDLE ASPIRATION (FNA) LINEAR;  Surgeon: [name], MD;  Location: [facility];  Service: Endoscopy;  Laterality: N/A;  + or- fna  . FLEXIBLE SIGMOIDOSCOPY    . HEMORRHOID SURGERY     banding   . HERNIA REPAIR Bilateral    '05 bilateral hernia  . KNEE ARTHROSCOPY Left    scope '08  . KNEE ARTHROSCOPY    . LAPAROSCOPIC APPENDECTOMY N/A 09/05/2014   Procedure: APPENDECTOMY LAPAROSCOPIC;  Surgeon: [name], MD;  Location: [facility];  Service: General;  Laterality: N/A;  . ROTATOR CUFF REPAIR Right 2016    Current Medications: Current Meds  Medication Sig  . aspirin 81 MG tablet Take 1 tablet (81 mg total) by mouth daily.  Marland Kitchen  atorvastatin (LIPITOR) 10 MG tablet Take 1 tablet (10 mg total) by mouth daily at 6 PM.  . Calcium Carbonate-Vitamin D (CALCIUM 600+D) 600-400 MG-UNIT per tablet Take 1 tablet by mouth daily.    . Cyanocobalamin (VITAMIN B-12 PO) Take 1 tablet by mouth daily.  Marland Kitchen doxazosin (CARDURA) 8 MG tablet Take 8 mg by mouth daily.  . feeding supplement, ENSURE ENLIVE, (ENSURE ENLIVE) LIQD Take 237 mLs by mouth 3 (three) times daily between meals.  . finasteride (PROSCAR) 5 MG tablet Take 5 mg by mouth daily.   . Glucosamine HCl 1500 MG TABS Take 1,500 mg by mouth daily.  Marland Kitchen ibuprofen (ADVIL,MOTRIN) 200 MG tablet Take 200 mg by mouth every 6 (six) hours as needed for headache (pain).  . Multiple Vitamin (MULTIVITAMIN WITH  MINERALS) TABS tablet Take 1 tablet by mouth daily.  . predniSONE (DELTASONE) 10 MG tablet Start at prednisone 25mg  and taper by 5mg  every 2 weeks. Stay on prednisone 15mg .  . pyridostigmine (MESTINON) 60 MG tablet Take 0.5 tablets (30 mg total) by mouth 3 (three) times daily. Take half tablet at 8am, 1pm, and 6pm (Patient taking differently: Take 30 mg by mouth 3 (three) times daily. Take 30 mg tablet at 8am, 1pm, and 6pm)     Allergies:   Pollen extract   Social History   Socioeconomic History  . Marital status: Married    Spouse name: Not on file  . Number of children: 3  . Years of education: 58  . Highest education level: Some college, no degree  Occupational History  . Occupation: sales-Retired, works one day a week  Social Needs  . Financial resource strain: Not on file  . Food insecurity:    Worry: Not on file    Inability: Not on file  . Transportation needs:    Medical: Not on file    Non-medical: Not on file  Tobacco Use  . Smoking status: Former Smoker    Packs/day: 1.00    Years: 47.00    Pack years: 47.00    Types: Cigarettes    Last attempt to quit: 10/29/2000    Years since quitting: 17.9  . Smokeless tobacco: Never Used  Substance and Sexual Activity  . Alcohol use: Yes    Alcohol/week: 1.0 standard drinks    Types: 1 Standard drinks or equivalent per week    Comment: socially- nightly 1 cocktail  . Drug use: No  . Sexual activity: Not on file  Lifestyle  . Physical activity:    Days per week: Not on file    Minutes per session: Not on file  . Stress: Not on file  Relationships  . Social connections:    Talks on phone: Not on file    Gets together: Not on file    Attends religious service: Not on file    Active member of club or organization: Not on file    Attends meetings of clubs or organizations: Not on file    Relationship status: Not on file  Other Topics Concern  . Not on file  Social History Narrative   Lives with wife in a one story  home with a basement.  Has 3 daughters.  Works one day a week at the [employer].  Retired from Press photographer.  Education: some college.    Caffeine use: decaf   Right handed     Family History: The patient's family history includes Aneurysm in his brother; Pancreatic cancer in his father; Stroke in his  mother. There is no history of Neuropathy.  ROS:   Please see the history of present illness.     All other systems reviewed and are negative.  EKGs/Labs/Other Studies Reviewed:    The following studies were reviewed today:  06/15/18: ECHO  - Left ventricle: The cavity size was normal. There was mild   concentric hypertrophy. Systolic function was vigorous. The   estimated ejection fraction was in the range of 65% to 70%. Wall   motion was normal; there were no regional wall motion   abnormalities. Doppler parameters are consistent with abnormal   left ventricular relaxation (grade 1 diastolic dysfunction).   There was no evidence of elevated ventricular filling pressure by   Doppler parameters. - Aortic valve: There was no regurgitation. - Mitral valve: Calcified annulus. Mildly thickened leaflets .   There was trivial regurgitation. Valve area by pressure   half-time: 2.37 cm^2. - Right ventricle: The cavity size was normal. Wall thickness was   normal. Systolic function was normal. - Right atrium: The atrium was normal in size. - Tricuspid valve: There was mild regurgitation. - Pulmonary arteries: Systolic pressure was within the normal   range. - Inferior vena cava: The vessel was normal in size. - Pericardium, extracardiac: There was no pericardial effusion.  Impressions:  - No cardiac source of emboli was indentified.  EKG:  EKG is not ordered today.  Prior EKG in August for instance showed sinus rhythm with nonspecific T wave flattening, no other adverse abnormalities.  Personally reviewed and interpreted.  Recent Labs: 06/14/2018: TSH 1.548 07/22/2018: Magnesium  2.2 08/19/2018: ALT 17; BUN 12; Creatinine, Ser 0.75; Potassium 3.8; Sodium 139 08/20/2018: Hemoglobin 11.5; Platelets 246  Recent Lipid Panel    Component Value Date/Time   CHOL 138 06/14/2018 1605   TRIG 71 06/14/2018 1605   HDL 48 06/14/2018 1605   CHOLHDL 2.9 06/14/2018 1605   VLDL 14 06/14/2018 1605   LDLCALC 76 06/14/2018 1605    Physical Exam:    VS:  BP 118/66   Pulse 81   Ht 6' (1.829 m)   Wt 195 lb 6.4 oz (88.6 kg)   SpO2 95%   BMI 26.50 kg/m     Wt Readings from Last 3 Encounters:  10/14/18 195 lb 6.4 oz (88.6 kg)  09/16/18 194 lb 8 oz (88.2 kg)  08/16/18 191 lb 12.8 oz (87 kg)     GEN:  Well nourished, well developed in no acute distress HEENT: Normal NECK: No JVD; No carotid bruits LYMPHATICS: No lymphadenopathy CARDIAC: RRR, no murmurs, rubs, gallops RESPIRATORY:  Mild left lung wheezing or rhonchi , no rales ABDOMEN: Soft, non-tender, non-distended MUSCULOSKELETAL:  No edema; No deformity  SKIN: Warm and dry NEUROLOGIC:  Alert and oriented x 3 PSYCHIATRIC:  Normal affect   ASSESSMENT:    1. Pre-operative cardiovascular examination   2. Other emphysema ([place])   3. PSVT (paroxysmal supraventricular tachycardia) ([facility])    PLAN:    In order of problems listed above:  Preoperative stratification - He is of low to moderate risk based mainly upon age.  EF is normal.  He has not had any dangerous arrhythmias.  He is able to complete greater than 4 METS of activity without any difficulty.  He may proceed with surgery from a cardiac perspective.  LDL 76 creatinine 0.7 TSH 1.5  COPD  - Lungs/Pleura: Mild to moderate changes of emphysema identified on most recent CT scan as well as pulmonary nodules which are being monitored.  -  I asked him to see if Dr. Rex Kras would take a look at this and to see if anything else needs to be done (PFTs, inhaler for instance).  He does have quite a bit of coughing at night, clear mucus production, some wheezing.  Left upper  lobe wheezing heard on exam today.  Former Smoker - Several pack-year history but quit many years ago.  Recent diverticulitis with perforation - Dr. [name].   Prior history of PSVT -Status post ablation AVNRT 2012 Dr. [name].  No further recurrence.  Excellent.  Medication Adjustments/Labs and Tests Ordered: Current medicines are reviewed at length with the patient today.  Concerns regarding medicines are outlined above.  No orders of the defined types were placed in this encounter.  No orders of the defined types were placed in this encounter.   Patient Instructions  Medication Instructions:  Your provider recommends that you continue on your current medications as directed. Please refer to the Current Medication list given to you today.    Labwork: None  Testing/Procedures: None  Follow-Up: Your provider recommends that you schedule a follow-up appointment AS NEEDED with Cardiology. Call us if you need anything!  Any Other Special Instructions Will Be Listed Below (If Applicable). You have been cleared for surgery!      Signed, Candee Furbish, MD  10/14/2018 10:37 AM    [facility]

## 2018-10-27 ENCOUNTER — Ambulatory Visit (INDEPENDENT_AMBULATORY_CARE_PROVIDER_SITE_OTHER): Payer: Medicare Other | Admitting: Neurology

## 2018-10-27 ENCOUNTER — Encounter

## 2018-10-27 ENCOUNTER — Encounter: Payer: Self-pay | Admitting: Neurology

## 2018-10-27 VITALS — BP 140/80 | HR 81 | Ht 72.0 in | Wt 199.5 lb

## 2018-10-27 DIAGNOSIS — G7000 Myasthenia gravis without (acute) exacerbation: Secondary | ICD-10-CM

## 2018-10-27 MED ORDER — PREDNISONE 10 MG PO TABS: ORAL_TABLET | ORAL | 1 refills | Status: DC

## 2018-10-27 NOTE — Progress Notes (Signed)
Follow-up Visit   Date: 10/27/18    [name] MRN: [id number] DOB: 08-27-1941   Interim History: [name] is a 78 y.o. right-handed Caucasian male with hyperlipidemia, COPD, peripheral neuropathy, SVT s/p ablation, BPH, former smoker, and recent bowel perforation due to diverticulitis returning to the clinic for follow-up of myasthenia gravis.  The patient was accompanied to the clinic by wife who also provides collateral information.    History of present illness: Patient was diagnosed with myasthenia gravis in August 2019 after presenting with left ptosis, facial weakness, double vision, and dysphagia. He went to the ER for these symptoms where stroke was excluded and AChR antibodies returned positive. He was started on mestinon 30mg  three times daily (8am, 4pm, and midnight).  He was started on prednisone 20mg , with no improvement.  He was hospitalized from 9/19 - 07/23/2018 with MG exacerbated and treated with IVIG with great response.   He also complains of severe neck pain which is worse with prolonged standing and neck extension.  He usually has to sit back down and rest for relief. He does not have numbness/tingling of the hands or weakness.   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  CSF testing was normal and genetic testing was normal.    UPDATE 08/06/2018:  Overall, he is doing well. He is able to eat better but still has fluctuating weakness with swallowing harder textured foods, especially in the morning.  He also has feeling of "looseness" around the lips.  No slurred speech, droopiness of the eyes, double vision, or limb weakness.  He remains on prednisone 20mg /d and mestinon 30mg  TID.  PA for IVIG is in process. He also complains of increased congestion at nighttime and dull headache at night.  UPDATE 09/16/2018:  Since his last visit, he has been hospitalized  with diverticulitis of the colon and bowel perforation and completed antibiotic treatment.  Fortunately, during his illness, he did not have worsening MG symptoms.  He remains on prednisone 30mg  and mestinon 30mg  TID and denies new weakness.  He is being evaluated for bowel resection by Dr. [name] who has requested neurology clearance.     UPDATE 10/27/2018:  He is here for follow-up visit.  Myasthenia gravis remains well-controlled and he has been able to taper prednisone down to 15mg  without any breakthrough symptoms.  He continues to take mestinon 30mg  TID and has not skipped any doses.  He still has mild right ptosis which is stable.  No double vision, difficutly swallowing, or facial weakness.    Medications:  Current Outpatient Medications on File Prior to Visit  Medication Sig Dispense Refill  . aspirin 81 MG tablet Take 1 tablet (81 mg total) by mouth daily.    Marland Kitchen atorvastatin (LIPITOR) 10 MG tablet Take 1 tablet (10 mg total) by mouth daily at 6 PM. 30 tablet 5  . Calcium Carbonate-Vitamin D (CALCIUM 600+D) 600-400 MG-UNIT per tablet Take 1 tablet by mouth daily.      . Cyanocobalamin (VITAMIN B-12 PO) Take 1 tablet by mouth daily.    Marland Kitchen doxazosin (CARDURA) 8 MG tablet Take 8 mg by mouth daily.    . feeding supplement, ENSURE ENLIVE, (ENSURE ENLIVE) LIQD Take 237 mLs by mouth 3 (three) times daily between meals. 60 Bottle 60  . finasteride (PROSCAR) 5 MG tablet Take 5 mg by mouth daily.   2  . Glucosamine HCl 1500 MG TABS Take  1,500 mg by mouth daily.    Marland Kitchen ibuprofen (ADVIL,MOTRIN) 200 MG tablet Take 200 mg by mouth every 6 (six) hours as needed for headache (pain).    . Multiple Vitamin (MULTIVITAMIN WITH MINERALS) TABS tablet Take 1 tablet by mouth daily.    Marland Kitchen pyridostigmine (MESTINON) 60 MG tablet Take 0.5 tablets (30 mg total) by mouth 3 (three) times daily. Take half tablet at 8am, 1pm, and 6pm (Patient taking differently: Take 30 mg by mouth 3 (three) times daily. Take 30 mg tablet at  8am, 1pm, and 6pm) 135 tablet 3   No current facility-administered medications on file prior to visit.     Allergies:  Allergies  Allergen Reactions  . Pollen Extract Other (See Comments)    Nasal congestion    Review of Systems:  CONSTITUTIONAL: No fevers, chills, night sweats, or weight loss.  EYES: No visual changes or eye pain ENT: No hearing changes.  No history of nose bleeds.   RESPIRATORY: No cough, wheezing and shortness of breath.   CARDIOVASCULAR: Negative for chest pain, and palpitations.   GI: +for abdominal discomfort, blood in stools or black stools.  No recent change in bowel habits.   GU:  No history of incontinence.   MUSCLOSKELETAL: No history of joint pain or swelling.  No myalgias.   SKIN: Negative for lesions, rash, and itching.   ENDOCRINE: Negative for cold or heat intolerance, polydipsia or goiter.   PSYCH:  No depression or anxiety symptoms.   NEURO: As Above.   Vital Signs:  BP 140/80   Pulse 81   Ht 6' (1.829 m)   Wt 199 lb 8 oz (90.5 kg)   SpO2 93%   BMI 27.06 kg/m   General Medical Exam:   General:  Well appearing, comfortable  Eyes/ENT: see cranial nerve examination.   Neck: No masses appreciated.  Full range of motion without tenderness.  No carotid bruits. Respiratory:  Clear to auscultation, good air entry bilaterally.   Cardiac:  Regular rate and rhythm, no murmur.   Ext:  No edema, mild ecchymosis over the right forearm  Neurological Exam: MENTAL STATUS including orientation to time, place, person, recent and remote memory, attention span and concentration, language, and fund of knowledge is normal.  Speech is not dysarthric.  CRANIAL NERVES:   Pupils equal round and reactive to light.  Normal conjugate, extra-ocular eye movements in all directions of gaze.  Mild right ptosis, without worsening with sustained upgaze.  Face is symmetric.  Orbicularis oculi, buccinator is 5/5, there is trace weakness with orbicularis oris.  Palate  elevates symmetrically.  Tongue is midline, strength is 5/5.  MOTOR:  Motor strength is 5/5 in all extremities, without fatigability.  No atrophy, fasciculations or abnormal movements.  No pronator drift.  Tone is normal.    COORDINATION/GAIT:    Gait narrow based and stable. He is able to stand up from low chair without using arms.    Data: Labs 06/15/2018:  AChR binding 1.69*, MUSK neg  MRI brain wo contrast 06/14/2018: Negative for acute infarct. 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  CTA head and neck 06/14/2018: 1. Negative for large vessel occlusion, dissection, or arterial stenosis in the head or neck. There is minimal atherosclerosis. 2. Positive for generalized arterial tortuosity. Ectatic distal aortic arch and proximal descending aorta. 3. A small 15 mm area of ground-glass opacity in the right upper lobe appears stable since 2015. Underlying centrilobular  Emphysema (ICD10-J43.9) suspected.  NCS/EMG of the legs 03/13/2018:  This is an abnormal study. There is electrophysiologic evidence oflength-dependent, moderately-severe, axonal sensory-motor polyneuropathy.  CT chest 07/11/2018:  Negative for thymoma  MRI cervical spine wo contrast 07/14/2018: 1. No acute finding. 2. Disc and facet degeneration causes foraminal impingement that is advanced on the left at c3-4 and on the left at C5-6. 3. Mild right foraminal narrowing at C5-6 and C6-7.   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  He was hospitalized from 9/19 - 9/25 with MG exacerbation and responded well to IVIG.  Since this time, he has been well-controlled on prednisone, maximal dose of 30mg /d and mestinon 30mg  TID. Exam shows mild right ptosis, which has been stable.  No other bulbar or limb weakness.  I have been slowly tapering his prednisone and he has been able to come down 15mg /d without breakthrough  weakness.  I will continue to slowly reduce his prednisone to 15mg  alternating with 10mg /d for two weeks, then keep him on prednisone 10mg  daily.   If he develops any breakthrough weakness, I will start maintenance therapy with monthy IVIG 1mg /kg, if needed. Continue mestinon 30mg  three times daily (8am, 1pm, and 6pm)  He is neurologically cleared for GI surgery.  Anesthesia precautions for MG patients: Avoid neuromuscular blocking agents, unless absolutely necessary.  He will need to stay on prednisone 10mg  daily prior to and following surgery.  Recommend continuing mestinon 30mg  at 8am, 1pm, and 6pm prior to surgery and restart when he can take PO medications post-operatively.    Return to clinic in 6 weeks  Thank you for allowing me to participate in patient's care.  If I can answer any additional questions, I would be pleased to do so.    Sincerely,    Makinzee Durley K. [name], DO

## 2018-10-27 NOTE — Patient Instructions (Signed)
You can start to reduce prednisone and alternate days with 15mg  and 10mg  daily for two weeks, then reduce to 10mg  daily.  Stay on prednisone 10mg  daily.  If you develop any weakness, please call my office  Continue mestinon 30mg  three times daily   Return to clinic in 6 weeks

## 2018-10-28 DIAGNOSIS — M48061 Spinal stenosis, lumbar region without neurogenic claudication: Secondary | ICD-10-CM | POA: Diagnosis not present

## 2018-10-28 DIAGNOSIS — G8929 Other chronic pain: Secondary | ICD-10-CM | POA: Diagnosis not present

## 2018-10-28 DIAGNOSIS — M545 Low back pain: Secondary | ICD-10-CM | POA: Diagnosis not present

## 2018-11-11 DIAGNOSIS — M545 Low back pain: Secondary | ICD-10-CM | POA: Diagnosis not present

## 2018-11-11 DIAGNOSIS — K5792 Diverticulitis of intestine, part unspecified, without perforation or abscess without bleeding: Secondary | ICD-10-CM | POA: Insufficient documentation

## 2018-11-29 HISTORY — PX: COLONOSCOPY: SHX174

## 2018-12-09 NOTE — Progress Notes (Signed)
Follow-up Visit   Date: 12/10/18    [name] MRN: [id number] DOB: 1941/10/24   Interim History: [name] is a 78 y.o. right-handed Caucasian male with hyperlipidemia, COPD, peripheral neuropathy, SVT s/p ablation, BPH, former smoker, and recent bowel perforation due to diverticulitis returning to the clinic for follow-up of myasthenia gravis.  The patient was accompanied to the clinic by wife who also provides collateral information.    History of present illness: Patient was diagnosed with myasthenia gravis in August 2019 after presenting with left ptosis, facial weakness, double vision, and dysphagia. He went to the ER for these symptoms where stroke was excluded and AChR antibodies returned positive. He was started on mestinon 30mg  three times daily (8am, 4pm, and midnight).  He was started on prednisone 20mg , with no improvement.  He was hospitalized from 9/19 - 07/23/2018 with MG exacerbated and treated with IVIG with great response.   He also complains of severe neck pain which is worse with prolonged standing and neck extension.  He usually has to sit back down and rest for relief. He does not have numbness/tingling of the hands or weakness.   He has previously been evaluated for neuropathy at [facility] and [place] with NCS/EMG which showed peripheral sensorimotor axonal neuropathy.  CSF testing was normal and genetic testing was normal.    UPDATE 08/06/2018:  Overall, he is doing well. He is able to eat better but still has fluctuating weakness with swallowing harder textured foods, especially in the morning.  He also has feeling of "looseness" around the lips.  No slurred speech, droopiness of the eyes, double vision, or limb weakness.  He remains on prednisone 20mg /d and mestinon 30mg  TID.  PA for IVIG is in process.   UPDATE 09/16/2018:  Since his last visit, he has been hospitalized with diverticulitis of the colon and bowel perforation and completed antibiotic  treatment.  Fortunately, during his illness, he did not have worsening MG symptoms.  He remains on prednisone 30mg  and mestinon 30mg  TID and denies new weakness.  He is being evaluated for bowel resection by Dr. [name] who has requested neurology clearance.     UPDATE 12/09/2018:  He has been able to reduce prednisone down to 10mg  daily and reports doing well, he still has right ptosis which is stable.   No double vision, difficulty swallowing, limb weakness, or facial weakness.  He is scheduled to have surgery for diverticulosis on 2/28. No new complaints.   Medications:  Current Outpatient Medications on File Prior to Visit  Medication Sig Dispense Refill  . aspirin 81 MG tablet Take 1 tablet (81 mg total) by mouth daily.    Marland Kitchen atorvastatin (LIPITOR) 10 MG tablet Take 1 tablet (10 mg total) by mouth daily at 6 PM. 30 tablet 5  . Calcium Carbonate-Vitamin D (CALCIUM 600+D) 600-400 MG-UNIT per tablet Take 1 tablet by mouth daily.      . Cyanocobalamin (VITAMIN B-12 PO) Take 1 tablet by mouth daily.    Marland Kitchen doxazosin (CARDURA) 8 MG tablet Take 8 mg by mouth daily.    . feeding supplement, ENSURE ENLIVE, (ENSURE ENLIVE) LIQD Take 237 mLs by mouth 3 (three) times daily between meals. 60 Bottle 60  . finasteride (PROSCAR) 5 MG tablet Take 5 mg by mouth daily.   2  . Glucosamine HCl 1500 MG TABS Take 1,500 mg by mouth daily.    Marland Kitchen ibuprofen (ADVIL,MOTRIN) 200 MG tablet Take 200 mg by mouth every 6 (six) hours  as needed for headache (pain).    . Multiple Vitamin (MULTIVITAMIN WITH MINERALS) TABS tablet Take 1 tablet by mouth daily.    . predniSONE (DELTASONE) 10 MG tablet Reduce prednisone to alternating days of 15mg  and 10mg  for two weeks, then reduce to 10mg  daily.  Stay on predinsone 10mg  daily. (Patient taking differently: 10 mg daily with breakfast. ) 90 tablet 1  . pyridostigmine (MESTINON) 60 MG tablet Take 0.5 tablets (30 mg total) by mouth 3 (three) times daily. Take half tablet at 8am, 1pm, and 6pm  (Patient taking differently: Take 30 mg by mouth 3 (three) times daily. Take 30 mg tablet at 8am, 1pm, and 6pm) 135 tablet 3   No current facility-administered medications on file prior to visit.     Allergies:  Allergies  Allergen Reactions  . Pollen Extract Other (See Comments)    Nasal congestion    Review of Systems:  CONSTITUTIONAL: No fevers, chills, night sweats, or weight loss.  EYES: No visual changes or eye pain ENT: No hearing changes.  No history of nose bleeds.   RESPIRATORY: No cough, wheezing and shortness of breath.   CARDIOVASCULAR: Negative for chest pain, and palpitations.   GI: +for abdominal discomfort, blood in stools or black stools.  No recent change in bowel habits.   GU:  No history of incontinence.   MUSCLOSKELETAL: No history of joint pain or swelling.  No myalgias.   SKIN: Negative for lesions, rash, and itching.   ENDOCRINE: Negative for cold or heat intolerance, polydipsia or goiter.   PSYCH:  No depression or anxiety symptoms.   NEURO: As Above.   Vital Signs:  BP 118/80   Pulse 90   Ht 6' (1.829 m)   Wt 202 lb 2 oz (91.7 kg)   SpO2 92%   BMI 27.41 kg/m    Neurological Exam: MENTAL STATUS including orientation to time, place, person, recent and remote memory, attention span and concentration, language, and fund of knowledge is normal.  Speech is not dysarthric.  CRANIAL NERVES:   Pupils equal round and reactive to light.  Normal conjugate, extra-ocular eye movements in all directions of gaze.  Mild right ptosis, without worsening with sustained upgaze.  Face is symmetric.  Orbicularis oculi, buccinator, and orbicularis oris is 5/5.  Palate elevates symmetrically.  Tongue is midline, strength is 5/5.  MOTOR:  Motor strength is 5/5 in all extremities, without fatigability.  No atrophy, fasciculations or abnormal movements.  No pronator drift.  Tone is normal.    COORDINATION/GAIT:    Gait narrow based and stable. He is able to stand up from  low chair without using arms.    Data: Labs 06/15/2018:  AChR binding 1.69*, MUSK neg  MRI brain wo contrast 06/14/2018: Negative for acute infarct. 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  CTA head and neck 06/14/2018: 1. Negative for large vessel occlusion, dissection, or arterial stenosis in the head or neck. There is minimal atherosclerosis. 2. Positive for generalized arterial tortuosity. Ectatic distal aortic arch and proximal descending aorta. 3. A small 15 mm area of ground-glass opacity in the right upper lobe appears stable since 2015. 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  NCS/EMG of the legs 03/13/2018:  This is an abnormal study. There is electrophysiologic evidence oflength-dependent, moderately-severe, axonal sensory-motor polyneuropathy.  CT chest 07/11/2018:  Negative for thymoma  MRI cervical spine wo contrast 07/14/2018: 1. No acute finding. 2. Disc and facet degeneration causes  foraminal impingement that is advanced on the left at c3-4 and on the left at C5-6. 3. Mild right foraminal narrowing at C5-6 and C6-7.   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  He was hospitalized from 9/19 - 9/25 with MG exacerbation and responded well to IVIG.  Since this time, he has been well-controlled on prednisone, maximal dose of 30mg /d and mestinon 30mg  TID. Exam shows mild right ptosis, which has been stable.  No other bulbar or limb weakness.  He is doing great on prednisone taper and now on 10mg  daily with no breakthrough weakness.   I will keep him on prednisone 10mg  daily and mestinon 30mg  TID  (8am, 1pm, and 6pm) If he develops MG exacerbation associated with surgery or post-op, he will need to be managed with IVIG (insurance has approved this).  Anesthesia precautions for MG patients: Avoid neuromuscular blocking agents, unless absolutely  necessary.  He will need to stay on prednisone 10mg  daily prior to and following surgery.  Recommend continuing mestinon 30mg  at 8am, 1pm, and 6pm prior to surgery and restart when he can take PO medications post-operatively.    Return to clinic in 2 months  Greater than 50% of this 17 minute visit was spent in counseling, explanation of diagnosis, planning of further management, and coordination of care.   Thank you for allowing me to participate in patient's care.  If I can answer any additional questions, I would be pleased to do so.    Sincerely,    Mercadez Heitman K. [name], DO

## 2018-12-10 ENCOUNTER — Encounter: Payer: Self-pay | Admitting: Neurology

## 2018-12-10 ENCOUNTER — Ambulatory Visit (INDEPENDENT_AMBULATORY_CARE_PROVIDER_SITE_OTHER): Payer: Medicare Other | Admitting: Neurology

## 2018-12-10 VITALS — BP 118/80 | HR 90 | Ht 72.0 in | Wt 202.1 lb

## 2018-12-10 DIAGNOSIS — G7000 Myasthenia gravis without (acute) exacerbation: Secondary | ICD-10-CM | POA: Diagnosis not present

## 2018-12-10 NOTE — Patient Instructions (Signed)
Continue prednisone 10mg  daily  Continue mestinon 30mg  (half-tablet) three times daily  Return to clinic in 2 months

## 2018-12-12 DIAGNOSIS — M533 Sacrococcygeal disorders, not elsewhere classified: Secondary | ICD-10-CM | POA: Diagnosis not present

## 2018-12-15 ENCOUNTER — Ambulatory Visit: Payer: Self-pay | Admitting: Surgery

## 2018-12-15 NOTE — H&P (View-Only) (Signed)
[name] Documented: 09/01/2018 8:56 AM Location: [facility] Patient #: [id number] DOB: [birth year] Married / Language: English / Race: White Male  History of Present Illness Douglas Hector MD; 09/01/2018 11:20 AM) The patient is a 78 year old male who presents with diverticulitis. Note for "Diverticulitis": ` ` ` Patient sent for surgical consultation at the request of Dr [name]  Chief Complaint: Recurrent diverticulitis. Recent abscess. 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 Recently on steroids. Had been on 20 mg twice a day Prednisone. Followed by neurology., hyperlipidemia. 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 Was admitted 10/19. Was in the hospital 5 days with an abscess. Seemed to improve clinically. The to 30 mg twice a day of prednisone. Transitioned to Cipro/Flagyl.  He comes in today with his wife. Abdominal pain is gone away. However he feels like he has some discomfort in the right lower side. Heart he had an appendectomy. No other surgeries. Moving his bowels better. Not is loose. Becoming more solid. He continues on a probiotic. He normally walks about 30 minutes a day. It has been thrown out of whack with his diagnosis of myasthenia since August. That presented with difficulty with swallowing and weakness. Unintentional 20 pound weight loss. That is improved on the medical treatment. Patient had a similar episode of diverticulitis with an abscess about 5 years ago. Was in the hospital for 5 days. He thinks he had a few attacks before that as well.  No personal nor family history of GI/colon cancer, inflammatory bowel disease, irritable bowel syndrome, allergy such as Celiac Sprue, dietary/dairy problems, colitis, ulcers nor gastritis. No recent sick contacts/gastroenteritis. No travel outside the country. No changes in  diet. No dysphagia to solids or liquids. No significant heartburn or reflux. No hematochezia, hematemesis, coffee ground emesis. No evidence of prior gastric/peptic ulceration.  Dx'd w [name].  Cleared by Dr Narda Amber with Neurology - rec no neuromuscular blocking agents periop.  Prednisone reduced to 10mg /day  (Review of systems as stated in this history (HPI) or in the review of systems. Otherwise all other 12 point ROS are negative) ` ` `   Allergies Sabino Gasser, CMA; 09/01/2018 8:57 AM) No Known Drug Allergies [08/30/2014]: Allergies Reconciled  Medication History Sabino Gasser, CMA; 09/01/2018 8:58 AM) Atorvastatin Calcium (80MG  Tablet, Oral) Active. Finasteride (5MG  Tablet, Oral daily) Active. Metamucil (Oral) Active. Gabapentin (400MG  Capsule, Oral daily) Active. Naproxen (500MG  Tablet, Oral daily) Active. Rapaflo (8MG  Capsule, Oral daily) Active. Proctosol HC (2.5% Cream, Rectal as needed) Active. Medications Reconciled    Vitals Sabino Gasser CMA; 09/01/2018 8:59 AM) 09/01/2018 8:58 AM Weight: 191 lb Height: 72in Body Surface Area: 2.09 m Body Mass Index: 25.9 kg/m  Temp.: 97.11F(Oral)  Pulse: 113 (Regular)  BP: 110/72 (Sitting, Left Arm, Standard)      Physical Exam Douglas Hector MD; 09/01/2018 9:41 AM)  General Mental Status-Alert. General Appearance-Not in acute distress, Not Sickly. Orientation-Oriented X3. Hydration-Well hydrated. Voice-Normal.  Integumentary Global Assessment Upon inspection and palpation of skin surfaces of the - Axillae: non-tender, no inflammation or ulceration, no drainage. and Distribution of scalp and body hair is normal. General Characteristics Temperature - normal warmth is noted.  Head and Neck Head-normocephalic, atraumatic with no lesions or palpable masses. Face Global Assessment - atraumatic, no absence of expression. Neck Global Assessment - no  abnormal movements, no bruit auscultated on the right, no bruit auscultated on  the left, no decreased range of motion, non-tender. Trachea-midline. Thyroid Gland Characteristics - non-tender.  Eye Eyeball - Left-Extraocular movements intact, No Nystagmus. Eyeball - Right-Extraocular movements intact, No Nystagmus. Cornea - Left-No Hazy. Cornea - Right-No Hazy. Sclera/Conjunctiva - Left-No scleral icterus, No Discharge. Sclera/Conjunctiva - Right-No scleral icterus, No Discharge. Pupil - Left-Direct reaction to light normal. Pupil - Right-Direct reaction to light normal. Note: Wears glasses. Vision corrected  ENMT Ears Pinna - Left - no drainage observed, no generalized tenderness observed. Right - no drainage observed, no generalized tenderness observed. Nose and Sinuses External Inspection of the Nose - no destructive lesion observed. Inspection of the nares - Left - quiet respiration. Right - quiet respiration. Mouth and Throat Lips - Upper Lip - no fissures observed, no pallor noted. Lower Lip - no fissures observed, no pallor noted. Nasopharynx - no discharge present. Oral Cavity/Oropharynx - Tongue - no dryness observed. Oral Mucosa - no cyanosis observed. Hypopharynx - no evidence of airway distress observed. Note: Hard of hearing  Chest and Lung Exam Inspection Movements - Normal and Symmetrical. Accessory muscles - No use of accessory muscles in breathing. Palpation Palpation of the chest reveals - Non-tender. Auscultation Breath sounds - Normal and Clear.  Cardiovascular Auscultation Rhythm - Regular. Murmurs & Other Heart Sounds - Auscultation of the heart reveals - No Murmurs and No Systolic Clicks.  Abdomen Inspection Inspection of the abdomen reveals - No Visible peristalsis and No Abnormal pulsations. Umbilicus - No Bleeding, No Urine drainage. Palpation/Percussion Palpation and Percussion of the abdomen reveal - Soft, Non Tender, No  Rebound tenderness, No Rigidity (guarding) and No Cutaneous hyperesthesia. Note: Abdomen soft. Nontender. Not distended. No umbilical or incisional hernias. No guarding.  Male Genitourinary Sexual Maturity Tanner 5 - Adult hair pattern and Adult penile size and shape. Note: Abdomen soft. Mild discomfort in right lower abdomen without any major guarding. No severe tenderness at left flank any more. Thin abdominal wall with moderate diastases recti. Overweight Nontender, nondistended. No guarding. No diastasis. No umbilical nor other hernias  Peripheral Vascular Upper Extremity Inspection - Left - No Cyanotic nailbeds, Not Ischemic. Right - No Cyanotic nailbeds, Not Ischemic.  Neurologic Neurologic evaluation reveals -normal attention span and ability to concentrate, able to name objects and repeat phrases. Appropriate fund of knowledge , normal sensation and normal coordination. Mental Status Affect - not angry, not paranoid. Cranial Nerves-Normal Bilaterally. Gait-Normal.  Neuropsychiatric Mental status exam performed with findings of-able to articulate well with normal speech/language, rate, volume and coherence, thought content normal with ability to perform basic computations and apply abstract reasoning and no evidence of hallucinations, delusions, obsessions or homicidal/suicidal ideation.  Musculoskeletal Global Assessment Spine, Ribs and Pelvis - no instability, subluxation or laxity. Right Upper Extremity - no instability, subluxation or laxity.  Lymphatic Head & Neck  General Head & Neck Lymphatics: Bilateral - Description - No Localized lymphadenopathy. Axillary  General Axillary Region: Bilateral - Description - No Localized lymphadenopathy. Femoral & Inguinal  Generalized Femoral & Inguinal Lymphatics: Left - Description - No Localized lymphadenopathy. Right - Description - No Localized lymphadenopathy.    Assessment & Plan Douglas Hector  MD; 09/01/2018 11:28 AM)  DIVERTICULITIS OF LARGE INTESTINE WITHOUT PERFORATION OR ABSCESS WITHOUT BLEEDING (K57.32) Impression: Recurrent diverticulitis. Prior attack ~ 2014 in [place] with abscess. Now with major perforation. Clinically improving on antibiotics.  He still has some pain but now RLQ. With him being immunosuppressed, repeat CT scan to rule out abscess. If it is there, percutaneous  drainage and leave in place until surgery since risk of fistula rather high.  I think he would benefit from surgical resection at some point. I would wait > 6-8 weeks from this attack to allow inflammation be minimized. Minimally invasive robotic sigmoid colectomy.  He is on IV immunoglobulin and steroids for his myasthenia gravis. HAd neurology clearance to see if they can back off on his prednisone a little bit if possible. Prednisone reduced to 10mg /day  Avoid NM blockade anesthetic agents.  Lower the risk of leak/ostomy/hernia/wound infection.  I would like cardiac clearance. Had normal echocardiogram 05/2018, but I'm pretty certain anesthesia willwant this.  I would like gastroenterology to re-scope him since its been several years from last attack. Rule out cancer. Rule out other problems in the colon. Ideally could time a day before surgery. He is working to set up appointment with Dr. [name] with gastroenterology.  Current Plans Follow Up - Call [facility] office after tests / studies doneto discuss further plans Pt Education - [facility] Free Text Education/Instructions: discussed with patient and provided information. CT ABDOMEN AND PELVIS W CONTRAST [phone]) (Pt needs CT A/P w/contrast to eval. for diverticulitis/abdominal pain. Pt is getting scheduled for surgery in Jan.2020 to have diverticulitis sx.)  PREOP COLON - ENCOUNTER FOR PREOPERATIVE EXAMINATION FOR GENERAL SURGICAL PROCEDURE (Z01.818)  Current Plans You are being scheduled for surgery- Our schedulers will call you.  You should  hear from our office's scheduling department within 5 working days about the location, date, and time of surgery. We try to make accommodations for patient's preferences in scheduling surgery, but sometimes the OR schedule or the surgeon's schedule prevents [place] from making those accommodations.  If you have not heard from our office [phone]) in 5 working days, call the office and ask for your surgeon's nurse.  If you have other questions about your diagnosis, plan, or surgery, call the office and ask for your surgeon's nurse.  Written instructions provided The anatomy & physiology of the digestive tract was discussed. The pathophysiology of the colon was discussed. Natural history risks without surgery was discussed. I feel the risks of no intervention will lead to serious problems that outweigh the operative risks; therefore, I recommended a partial colectomy to remove the pathology. Minimally invasive (Robotic/Laparoscopic) & open techniques were discussed.  Risks such as bleeding, infection, abscess, leak, reoperation, possible ostomy, hernia, heart attack, death, and other risks were discussed. I noted a good likelihood this will help address the problem. Goals of post-operative recovery were discussed as well. Need for adequate nutrition, daily bowel regimen and healthy physical activity, to optimize recovery was noted as well. We will work to minimize complications. Educational materials were available as well. Questions were answered. The patient expresses understanding & wishes to proceed with surgery.  Pt Education - [facility] Colon Bowel Prep 2018 ERAS/Miralax/Antibiotics Started Neomycin Sulfate 500 MG Oral Tablet, 2 (two) Tablet SEE NOTE, #6, 09/01/2018, No Refill. Local Order: TAKE TWO TABLETS AT 2 PM, 3 PM, AND 10 PM THE DAY PRIOR TO SURGERY Started metroNIDAZOLE 500 MG Oral Tablet, 2 (two) Tablet SEE NOTE, #6, 09/01/2018, No Refill. Local Order: Take at 2pm, 3pm, and  10pm the day prior to your colon operation Pt Education - Pamphlet Given - Laparoscopic Colorectal Surgery: discussed with patient and provided information. Pt Education - [facility] Colectomy post-op instructions: discussed with patient and provided information.   Douglas Hector, MD, FACS, MASCRS Gastrointestinal and Minimally Invasive Surgery    [address]  [place], [place] [address] [phone] Main / Paging [phone] Fax

## 2018-12-15 NOTE — H&P (Addendum)
[name] Documented: 09/01/2018 8:56 AM Location: [facility] Patient #: [id number] DOB: 01/02/1941 Married / Language: English / Race: White Male  History of Present Illness Adin Hector MD; 09/01/2018 11:20 AM) The patient is a 78 year old male who presents with diverticulitis. Note for "Diverticulitis": ` ` ` Patient sent for surgical consultation at the request of Dr [name]  Chief Complaint: Recurrent diverticulitis. Recent abscess. 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 Recently on steroids. Had been on 20 mg twice a day Prednisone. Followed by neurology., hyperlipidemia. 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 Was admitted 10/19. Was in the hospital 5 days with an abscess. Seemed to improve clinically. The to 30 mg twice a day of prednisone. Transitioned to Cipro/Flagyl.  He comes in today with his wife. Abdominal pain is gone away. However he feels like he has some discomfort in the right lower side. Heart he had an appendectomy. No other surgeries. Moving his bowels better. Not is loose. Becoming more solid. He continues on a probiotic. He normally walks about 30 minutes a day. It has been thrown out of whack with his diagnosis of myasthenia since August. That presented with difficulty with swallowing and weakness. Unintentional 20 pound weight loss. That is improved on the medical treatment. Patient had a similar episode of diverticulitis with an abscess about 5 years ago. Was in the hospital for 5 days. He thinks he had a few attacks before that as well.  No personal nor family history of GI/colon cancer, inflammatory bowel disease, irritable bowel syndrome, allergy such as Celiac Sprue, dietary/dairy problems, colitis, ulcers nor gastritis. No recent sick contacts/gastroenteritis. No travel outside the country. No changes in  diet. No dysphagia to solids or liquids. No significant heartburn or reflux. No hematochezia, hematemesis, coffee ground emesis. No evidence of prior gastric/peptic ulceration.  Dx'd w [name].  Cleared by Dr Narda Amber with Neurology - rec no neuromuscular blocking agents periop.  Prednisone reduced to 10mg /day  (Review of systems as stated in this history (HPI) or in the review of systems. Otherwise all other 12 point ROS are negative) ` ` `   Allergies Sabino Gasser, CMA; 09/01/2018 8:57 AM) No Known Drug Allergies [08/30/2014]: Allergies Reconciled  Medication History Sabino Gasser, CMA; 09/01/2018 8:58 AM) Atorvastatin Calcium (80MG  Tablet, Oral) Active. Finasteride (5MG  Tablet, Oral daily) Active. Metamucil (Oral) Active. Gabapentin (400MG  Capsule, Oral daily) Active. Naproxen (500MG  Tablet, Oral daily) Active. Rapaflo (8MG  Capsule, Oral daily) Active. Proctosol HC (2.5% Cream, Rectal as needed) Active. Medications Reconciled    Vitals Sabino Gasser CMA; 09/01/2018 8:59 AM) 09/01/2018 8:58 AM Weight: 191 lb Height: 72in Body Surface Area: 2.09 m Body Mass Index: 25.9 kg/m  Temp.: 97.8F(Oral)  Pulse: 113 (Regular)  BP: 110/72 (Sitting, Left Arm, Standard)      Physical Exam Adin Hector MD; 09/01/2018 9:41 AM)  General Mental Status-Alert. General Appearance-Not in acute distress, Not Sickly. Orientation-Oriented X3. Hydration-Well hydrated. Voice-Normal.  Integumentary Global Assessment Upon inspection and palpation of skin surfaces of the - Axillae: non-tender, no inflammation or ulceration, no drainage. and Distribution of scalp and body hair is normal. General Characteristics Temperature - normal warmth is noted.  Head and Neck Head-normocephalic, atraumatic with no lesions or palpable masses. Face Global Assessment - atraumatic, no absence of expression. Neck Global Assessment - no  abnormal movements, no bruit auscultated on the right, no bruit auscultated on  the left, no decreased range of motion, non-tender. Trachea-midline. Thyroid Gland Characteristics - non-tender.  Eye Eyeball - Left-Extraocular movements intact, No Nystagmus. Eyeball - Right-Extraocular movements intact, No Nystagmus. Cornea - Left-No Hazy. Cornea - Right-No Hazy. Sclera/Conjunctiva - Left-No scleral icterus, No Discharge. Sclera/Conjunctiva - Right-No scleral icterus, No Discharge. Pupil - Left-Direct reaction to light normal. Pupil - Right-Direct reaction to light normal. Note: Wears glasses. Vision corrected  ENMT Ears Pinna - Left - no drainage observed, no generalized tenderness observed. Right - no drainage observed, no generalized tenderness observed. Nose and Sinuses External Inspection of the Nose - no destructive lesion observed. Inspection of the nares - Left - quiet respiration. Right - quiet respiration. Mouth and Throat Lips - Upper Lip - no fissures observed, no pallor noted. Lower Lip - no fissures observed, no pallor noted. Nasopharynx - no discharge present. Oral Cavity/Oropharynx - Tongue - no dryness observed. Oral Mucosa - no cyanosis observed. Hypopharynx - no evidence of airway distress observed. Note: Hard of hearing  Chest and Lung Exam Inspection Movements - Normal and Symmetrical. Accessory muscles - No use of accessory muscles in breathing. Palpation Palpation of the chest reveals - Non-tender. Auscultation Breath sounds - Normal and Clear.  Cardiovascular Auscultation Rhythm - Regular. Murmurs & Other Heart Sounds - Auscultation of the heart reveals - No Murmurs and No Systolic Clicks.  Abdomen Inspection Inspection of the abdomen reveals - No Visible peristalsis and No Abnormal pulsations. Umbilicus - No Bleeding, No Urine drainage. Palpation/Percussion Palpation and Percussion of the abdomen reveal - Soft, Non Tender, No  Rebound tenderness, No Rigidity (guarding) and No Cutaneous hyperesthesia. Note: Abdomen soft. Nontender. Not distended. No umbilical or incisional hernias. No guarding.  Male Genitourinary Sexual Maturity Tanner 5 - Adult hair pattern and Adult penile size and shape. Note: Abdomen soft. Mild discomfort in right lower abdomen without any major guarding. No severe tenderness at left flank any more. Thin abdominal wall with moderate diastases recti. Overweight Nontender, nondistended. No guarding. No diastasis. No umbilical nor other hernias  Peripheral Vascular Upper Extremity Inspection - Left - No Cyanotic nailbeds, Not Ischemic. Right - No Cyanotic nailbeds, Not Ischemic.  Neurologic Neurologic evaluation reveals -normal attention span and ability to concentrate, able to name objects and repeat phrases. Appropriate fund of knowledge , normal sensation and normal coordination. Mental Status Affect - not angry, not paranoid. Cranial Nerves-Normal Bilaterally. Gait-Normal.  Neuropsychiatric Mental status exam performed with findings of-able to articulate well with normal speech/language, rate, volume and coherence, thought content normal with ability to perform basic computations and apply abstract reasoning and no evidence of hallucinations, delusions, obsessions or homicidal/suicidal ideation.  Musculoskeletal Global Assessment Spine, Ribs and Pelvis - no instability, subluxation or laxity. Right Upper Extremity - no instability, subluxation or laxity.  Lymphatic Head & Neck  General Head & Neck Lymphatics: Bilateral - Description - No Localized lymphadenopathy. Axillary  General Axillary Region: Bilateral - Description - No Localized lymphadenopathy. Femoral & Inguinal  Generalized Femoral & Inguinal Lymphatics: Left - Description - No Localized lymphadenopathy. Right - Description - No Localized lymphadenopathy.    Assessment & Plan Adin Hector  MD; 09/01/2018 11:28 AM)  DIVERTICULITIS OF LARGE INTESTINE WITHOUT PERFORATION OR ABSCESS WITHOUT BLEEDING (K57.32) Impression: Recurrent diverticulitis. Prior attack ~ 2014 in [place] with abscess. Now with major perforation. Clinically improving on antibiotics.  He still has some pain but now RLQ. With him being immunosuppressed, repeat CT scan to rule out abscess. If it is there, percutaneous  drainage and leave in place until surgery since risk of fistula rather high.  I think he would benefit from surgical resection at some point. I would wait > 6-8 weeks from this attack to allow inflammation be minimized. Minimally invasive robotic sigmoid colectomy.  He is on IV immunoglobulin and steroids for his myasthenia gravis. [facility]Ad neurology clearance to see if they can back off on his prednisone a little bit if possible. Prednisone reduced to 10mg /day  Avoid NM blockade anesthetic agents.  Lower the risk of leak/ostomy/hernia/wound infection.  I would like cardiac clearance. Had normal echocardiogram 05/2018, but I'm pretty certain anesthesia willwant this.  I would like gastroenterology to re-scope him since its been several years from last attack. Rule out cancer. Rule out other problems in the colon. Ideally could time a day before surgery. He is working to set up appointment with Dr. [name] with gastroenterology.  Current Plans Follow Up - Call [facility] office after tests / studies doneto discuss further plans Pt Education - [facility] Free Text Education/Instructions: discussed with patient and provided information. CT ABDOMEN AND PELVIS W CONTRAST [phone]) (Pt needs CT A/P w/contrast to eval. for diverticulitis/abdominal pain. Pt is getting scheduled for surgery in Jan.2020 to have diverticulitis sx.)  PREOP COLON - ENCOUNTER FOR PREOPERATIVE EXAMINATION FOR GENERAL SURGICAL PROCEDURE (Z01.818)  Current Plans You are being scheduled for surgery- Our schedulers will call you.  You should  hear from our office's scheduling department within 5 working days about the location, date, and time of surgery. We try to make accommodations for patient's preferences in scheduling surgery, but sometimes the OR schedule or the surgeon's schedule prevents [place] from making those accommodations.  If you have not heard from our office [phone]) in 5 working days, call the office and ask for your surgeon's nurse.  If you have other questions about your diagnosis, plan, or surgery, call the office and ask for your surgeon's nurse.  Written instructions provided The anatomy & physiology of the digestive tract was discussed. The pathophysiology of the colon was discussed. Natural history risks without surgery was discussed. I feel the risks of no intervention will lead to serious problems that outweigh the operative risks; therefore, I recommended a partial colectomy to remove the pathology. Minimally invasive (Robotic/Laparoscopic) & open techniques were discussed.  Risks such as bleeding, infection, abscess, leak, reoperation, possible ostomy, hernia, heart attack, death, and other risks were discussed. I noted a good likelihood this will help address the problem. Goals of post-operative recovery were discussed as well. Need for adequate nutrition, daily bowel regimen and healthy physical activity, to optimize recovery was noted as well. We will work to minimize complications. Educational materials were available as well. Questions were answered. The patient expresses understanding & wishes to proceed with surgery.  Pt Education - [facility] Colon Bowel Prep 2018 ERAS/Miralax/Antibiotics Started Neomycin Sulfate 500 MG Oral Tablet, 2 (two) Tablet SEE NOTE, #6, 09/01/2018, No Refill. Local Order: TAKE TWO TABLETS AT 2 PM, 3 PM, AND 10 PM THE DAY PRIOR TO SURGERY Started metroNIDAZOLE 500 MG Oral Tablet, 2 (two) Tablet SEE NOTE, #6, 09/01/2018, No Refill. Local Order: Take at 2pm, 3pm, and  10pm the day prior to your colon operation Pt Education - Pamphlet Given - Laparoscopic Colorectal Surgery: discussed with patient and provided information. Pt Education - [facility] Colectomy post-op instructions: discussed with patient and provided information.   Adin Hector, MD, FACS, MASCRS Gastrointestinal and Minimally Invasive Surgery    [address], [address]  [place], [place] [address] [phone] Main / Paging [phone] Fax

## 2018-12-18 DIAGNOSIS — Z0000 Encounter for general adult medical examination without abnormal findings: Secondary | ICD-10-CM | POA: Diagnosis not present

## 2018-12-18 DIAGNOSIS — Z79899 Other long term (current) drug therapy: Secondary | ICD-10-CM | POA: Diagnosis not present

## 2018-12-18 DIAGNOSIS — N401 Enlarged prostate with lower urinary tract symptoms: Secondary | ICD-10-CM | POA: Diagnosis not present

## 2018-12-18 DIAGNOSIS — Z8679 Personal history of other diseases of the circulatory system: Secondary | ICD-10-CM | POA: Diagnosis not present

## 2018-12-18 DIAGNOSIS — G629 Polyneuropathy, unspecified: Secondary | ICD-10-CM | POA: Diagnosis not present

## 2018-12-18 DIAGNOSIS — E782 Mixed hyperlipidemia: Secondary | ICD-10-CM | POA: Diagnosis not present

## 2018-12-18 DIAGNOSIS — Z8719 Personal history of other diseases of the digestive system: Secondary | ICD-10-CM | POA: Diagnosis not present

## 2018-12-18 DIAGNOSIS — R911 Solitary pulmonary nodule: Secondary | ICD-10-CM | POA: Diagnosis not present

## 2018-12-18 DIAGNOSIS — G7000 Myasthenia gravis without (acute) exacerbation: Secondary | ICD-10-CM | POA: Diagnosis not present

## 2018-12-18 NOTE — Progress Notes (Signed)
12-10-08 [facility]) Neurology clearance from Dr. [name]   10-14-18 [facility]) Cardiac Clearance from Dr. Marlou Porch  08-16-18 (Epic) EKG  06-15-18 (Epic) ECHO

## 2018-12-18 NOTE — Patient Instructions (Addendum)
[name]  12/18/2018   Your procedure is scheduled on: 12-26-18    Report to Dr [facility] Main  Entrance    Report to Admitting at 11:00 AM    Call this number if you have problems the morning of surgery [phone]     Remember:DRINK 2 PRESURGERY ENSURE DRINKS THE NIGHT BEFORE SURGERY AT  1000 PM AND 1 PRESURGERY DRINK THE DAY OF THE PROCEDURE 3 HOURS PRIOR TO SCHEDULED SURGERY. NO SOLIDS AFTER MIDNIGHT THE DAY PRIOR TO THE SURGERY. NOTHING BY MOUTH EXCEPT CLEAR LIQUIDS UNTIL THREE HOURS PRIOR TO SCHEDULED SURGERY. PLEASE FINISH PRESURGERY ENSURE DRINK PER SURGEON ORDER 3 HOURS PRIOR TO SCHEDULED SURGERY TIME WHICH NEEDS TO BE COMPLETED AT 10:00 AM.    CLEAR LIQUID DIET   Foods Allowed                                                                     Foods Excluded  Coffee and tea, regular and decaf                             liquids that you cannot  Plain Jell-O in any flavor                                             see through such as: Fruit ices (not with fruit pulp)                                     milk, soups, orange juice  Iced Popsicles                                    All solid food Carbonated beverages, regular and diet                                    Cranberry, grape and apple juices Sports drinks like Gatorade Lightly seasoned clear broth or consume(fat free) Sugar, honey syrup  Sample Menu Breakfast                                Lunch                                     Supper Cranberry juice                    Beef broth                            Chicken broth Jell-O  Grape juice                           Apple juice Coffee or tea                        Jell-O                                      Popsicle                                                Coffee or tea                        Coffee or tea  _____________________________________________________________________    Take  these medicines the morning of surgery with A SIP OF WATER: Doxazosin ([place]), Finasteride (Proscar), and Pyridostigmine (Mestinon)        BRUSH YOUR TEETH MORNING OF SURGERY AND RINSE YOUR MOUTH OUT, NO CHEWING GUM CANDY OR MINTS.                          You may not have any metal on your body including hair pins and              piercings  Do not wear jewelry, cologne, lotions, powders or deodorant             Men may shave face and neck.   Do not bring valuables to the hospital. [place].  Contacts, dentures or bridgework may not be worn into surgery.  Leave suitcase in the car. After surgery it may be brought to your room.     Patients discharged the day of surgery will not be allowed to drive home. IF YOU ARE HAVING SURGERY AND GOING HOME THE SAME DAY, YOU MUST HAVE AN ADULT TO DRIVE YOU HOME AND BE WITH YOU FOR 24 HOURS. YOU MAY GO HOME BY TAXI OR UBER OR ORTHERWISE, BUT AN ADULT MUST ACCOMPANY YOU HOME AND STAY WITH YOU FOR 24 HOURS.    Special Instructions: N/A              Please read over the following fact sheets you were given: _____________________________________________________________________    [facility] - Preparing for Surgery  Before surgery, you can play an important role.  Because skin is not sterile, your skin needs to be as free of germs as possible.  You can reduce the number of germs on your skin by washing with CHG (chlorahexidine gluconate) soap before surgery.  CHG is an antiseptic cleaner which kills germs and bonds with the skin to continue killing germs even after washing. Please DO NOT use if you have an allergy to CHG or antibacterial soaps.  If your skin becomes reddened/irritated stop using the CHG and inform your nurse when you arrive at Short Stay. Do not shave (including legs and underarms) for at least 48 hours prior to the first CHG shower.  You may shave your face/neck. Please follow these  instructions carefully:  1.  Shower with CHG  Soap the night before surgery and the  morning of Surgery.  2.  If you choose to wash your hair, wash your hair first as usual with your  normal  shampoo.  3.  After you shampoo, rinse your hair and body thoroughly to remove the  shampoo.                           4.  Use CHG as you would any other liquid soap.  You can apply chg directly  to the skin and wash                       Gently with a scrungie or clean washcloth.  5.  Apply the CHG Soap to your body ONLY FROM THE NECK DOWN.   Do not use on face/ open                           Wound or open sores. Avoid contact with eyes, ears mouth and genitals (private parts).                       Wash face,  Genitals (private parts) with your normal soap.             6.  Wash thoroughly, paying special attention to the area where your surgery  will be performed.  7.  Thoroughly rinse your body with warm water from the neck down.  8.  DO NOT shower/wash with your normal soap after using and rinsing off  the CHG Soap.                9.  Pat yourself dry with a clean towel.            10.  Wear clean pajamas.            11.  Place clean sheets on your bed the night of your first shower and do not  sleep with pets. Day of Surgery : Do not apply any lotions/deodorants the morning of surgery.  Please wear clean clothes to the hospital/surgery center.  FAILURE TO FOLLOW THESE INSTRUCTIONS MAY RESULT IN THE CANCELLATION OF YOUR SURGERY PATIENT SIGNATURE_________________________________  NURSE SIGNATURE__________________________________  ________________________________________________________________________

## 2018-12-22 ENCOUNTER — Other Ambulatory Visit: Payer: Self-pay | Admitting: Family Medicine

## 2018-12-22 DIAGNOSIS — R911 Solitary pulmonary nodule: Secondary | ICD-10-CM

## 2018-12-23 DIAGNOSIS — M533 Sacrococcygeal disorders, not elsewhere classified: Secondary | ICD-10-CM | POA: Diagnosis not present

## 2018-12-24 ENCOUNTER — Encounter (HOSPITAL_COMMUNITY): Payer: Self-pay

## 2018-12-24 ENCOUNTER — Other Ambulatory Visit: Payer: Self-pay

## 2018-12-24 ENCOUNTER — Encounter (HOSPITAL_COMMUNITY)
Admission: RE | Admit: 2018-12-24 | Discharge: 2018-12-24 | Disposition: A | Discharge status: Home / Self Care | Payer: Medicare Other | Source: Ambulatory Visit | Attending: Surgery | Admitting: Surgery

## 2018-12-24 DIAGNOSIS — K5720 Diverticulitis of large intestine with perforation and abscess without bleeding: Secondary | ICD-10-CM | POA: Insufficient documentation

## 2018-12-24 DIAGNOSIS — Z01812 Encounter for preprocedural laboratory examination: Secondary | ICD-10-CM | POA: Insufficient documentation

## 2018-12-24 HISTORY — DX: Other complications of anesthesia, initial encounter: T88.59XA

## 2018-12-24 HISTORY — DX: Adverse effect of unspecified anesthetic, initial encounter: T41.45XA

## 2018-12-24 LAB — BASIC METABOLIC PANEL
Anion gap: 8 (ref 5–15)
BUN: 17 mg/dL (ref 8–23)
CO2: 27 mmol/L (ref 22–32)
Calcium: 9.6 mg/dL (ref 8.9–10.3)
Chloride: 107 mmol/L (ref 98–111)
Creatinine, Ser: 0.7 mg/dL (ref 0.61–1.24)
GFR calc Af Amer: >60 mL/min (ref >60)
GFR calc non Af Amer: >60 mL/min (ref >60)
Glucose, Bld: 117 mg/dL — ABNORMAL HIGH (ref 70–99)
Potassium: 4.2 mmol/L (ref 3.5–5.1)
Sodium: 142 mmol/L (ref 135–145)

## 2018-12-24 LAB — CBC
HCT: 47.7 % (ref 39.0–52.0)
Hemoglobin: 14.7 g/dL (ref 13.0–17.0)
MCH: 28.7 pg (ref 26.0–34.0)
MCHC: 30.8 g/dL (ref 30.0–36.0)
MCV: 93.2 fL (ref 80.0–100.0)
Platelets: 300 10*3/uL (ref 150–400)
RBC: 5.12 MIL/uL (ref 4.22–5.81)
RDW: 13.2 % (ref 11.5–15.5)
WBC: 11.7 10*3/uL — ABNORMAL HIGH (ref 4.0–10.5)
nRBC: 0 % (ref 0.0–0.2)

## 2018-12-24 LAB — HEMOGLOBIN A1C
Hgb A1c MFr Bld: 5.7 % — ABNORMAL HIGH (ref 4.8–5.6)
Mean Plasma Glucose: 116.89 mg/dL

## 2018-12-24 NOTE — Progress Notes (Signed)
PCP:Dr. Hulan Fess  CARDIOLOGIST: Dr. Marlou Porch  INFO IN Epic:   12-10-08 [facility]) Neurology clearance from Dr. [name]   10-14-18 [facility]) Cardiac Clearance from Dr. Marlou Porch  08-16-18 (Epic) EKG  06-15-18 (Epic) ECHO   INFO ON CHART:  BLOOD THINNERS AND LAST DOSES: 81 mg ASA, Pt has not stopped taking ____________________________________  PATIENT SYMPTOMS AT TIME OF PREOP:  Hx of HTN, COPD Gold II, Myasthenia Graves

## 2018-12-25 ENCOUNTER — Encounter: Payer: Self-pay | Admitting: Internal Medicine

## 2018-12-25 DIAGNOSIS — D125 Benign neoplasm of sigmoid colon: Secondary | ICD-10-CM | POA: Diagnosis not present

## 2018-12-25 DIAGNOSIS — K5732 Diverticulitis of large intestine without perforation or abscess without bleeding: Secondary | ICD-10-CM | POA: Diagnosis not present

## 2018-12-25 DIAGNOSIS — D120 Benign neoplasm of cecum: Secondary | ICD-10-CM | POA: Diagnosis not present

## 2018-12-25 DIAGNOSIS — K5730 Diverticulosis of large intestine without perforation or abscess without bleeding: Secondary | ICD-10-CM | POA: Diagnosis not present

## 2018-12-25 DIAGNOSIS — K640 First degree hemorrhoids: Secondary | ICD-10-CM | POA: Diagnosis not present

## 2018-12-25 DIAGNOSIS — D124 Benign neoplasm of descending colon: Secondary | ICD-10-CM | POA: Diagnosis not present

## 2018-12-25 MED ORDER — BUPIVACAINE LIPOSOME 1.3 % IJ SUSP
20.0000 mL | INTRAMUSCULAR | Status: DC
  Filled 2018-12-25: qty 20

## 2018-12-25 MED ORDER — SODIUM CHLORIDE 0.9 % IV SOLN
INTRAVENOUS | Status: DC
  Filled 2018-12-25: qty 6

## 2018-12-25 NOTE — Anesthesia Preprocedure Evaluation (Addendum)
Anesthesia Evaluation  Patient identified by MRN, date of birth, ID band Patient awake    Reviewed: Allergy & Precautions, NPO status , Patient's Chart, lab work & pertinent test results  Airway Mallampati: I  TM Distance: >3 FB Neck ROM: Full    Dental   Pulmonary COPD, former smoker,    Pulmonary exam normal        Cardiovascular hypertension, Pt. on medications Normal cardiovascular exam     Neuro/Psych H O Myesthenia Gravis on Mestinon    GI/Hepatic   Endo/Other    Renal/GU      Musculoskeletal   Abdominal   Peds  Hematology   Anesthesia Other Findings   Reproductive/Obstetrics                           Anesthesia Physical Anesthesia Plan  ASA: III  Anesthesia Plan: General   Post-op Pain Management:    Induction: Intravenous  PONV Risk Score and Plan: 2  Airway Management Planned: Oral ETT  Additional Equipment:   Intra-op Plan:   Post-operative Plan: Extubation in OR  Informed Consent: I have reviewed the patients History and Physical, chart, labs and discussed the procedure including the risks, benefits and alternatives for the proposed anesthesia with the patient or authorized representative who has indicated his/her understanding and acceptance.       Plan Discussed with: CRNA and Surgeon  Anesthesia Plan Comments: (See PAT note 12/24/18, Konrad Felix, PA-C)       Anesthesia Quick Evaluation

## 2018-12-25 NOTE — Progress Notes (Signed)
Anesthesia Chart Review   Case:  [id number] Date/Time:  12/26/18 1245   Procedures:      XI ROBOT RESECTION OF SIGMOID COLON (N/A )     RIGID PROCTOSCOPY (N/A )   Anesthesia type:  General   Pre-op diagnosis:  diverticulitis   Location:  [facility] ROOM 02 / [facility]   Surgeon:  [name], MD      DISCUSSION:78 yo former smoker (72 pack years, quit 10/29/00) with h/o HLD, SVT s/p ablation 2010, BPH, myasthenia gravis (on IVIG immunoglobulin), COPD, HTN, bowel perforation due to diverticulitis scheduled for above surgery 12/06/18 with Dr. [name].   Myasthenia gravis well controlled on prednisone 10mg  daily and mestinon 30 mg TID.  Followed by Dr. [name].  Last seen 12/10/18.  Per her note, "I will keep him on prednisone 10mg  daily and mestinon 30mg  TID  (8am, 1pm, and 6pm).  If he develops MG exacerbation associated with surgery or post-op, he will need to be managed with IVIG (insurance has approved this).  Anesthesia precautions for MG patients:Avoid neuromuscular blocking agents, unless absolutely necessary.He will need to stay on prednisone 10mg  daily prior to and following surgery. Recommend continuing mestinon 30mg  at 8am, 1pm, and 6pm prior to surgery and restart when he can take PO medications post-operatively."  Pt last seen by Cardiologist, Dr. Candee Furbish, on 10/14/18.  Per his note, "He is of low to moderate risk based mainly upon age.  EF is normal.  He has not had any dangerous arrhythmias.  He is able to complete greater than 4 METS of activity without any difficulty.  He may proceed with surgery from a cardiac perspective.  LDL 76 creatinine 0.7 TSH 1.5"  Pt can proceed with planned procedure barring acute status change.  VS: BP 133/78 (BP Location: Left Arm)   Pulse 92   Temp 36.8 C (Oral)   Resp 18   Ht 6' (1.829 m)   Wt 91.4 kg   SpO2 92%   BMI 27.33 kg/m   PROVIDERS: Hulan Fess, MD is PCP   Narda Amber, DO is Neurologist   Candee Furbish, MD is Cardiologist   LABS: Labs reviewed: Acceptable for surgery. (all labs ordered are listed, but only abnormal results are displayed)  Labs Reviewed  HEMOGLOBIN A1C - Abnormal; Notable for the following components:      Result Value   Hgb A1c MFr Bld 5.7 (*)    All other components within normal limits  BASIC METABOLIC PANEL - Abnormal; Notable for the following components:   Glucose, Bld 117 (*)    All other components within normal limits  CBC - Abnormal; Notable for the following components:   WBC 11.7 (*)    All other components within normal limits     IMAGES: CT Abdomen Pelvis 09/03/2018 IMPRESSION: 1. Acute multifocal mild colonic diverticulitis. Small volume residual versus recurrent pneumoperitoneum. Severe diverticulosis. No focal fluid collection. 2. Prostatomegaly invading bladder.  Multiple small bladder calculi. 3.  Aortic Atherosclerosis (ICD10-I70.0).  EKG: 08/16/18 Rate 98 bpm Sinus rhythm  Borderline repolarization abnormality Baseline wander in leads V3  CV: Echo 06/15/18 Study Conclusions  - Left ventricle: The cavity size was normal. There was mild   concentric hypertrophy. Systolic function was vigorous. The   estimated ejection fraction was in the range of 65% to 70%. Wall   motion was normal; there were no regional wall motion   abnormalities. Doppler parameters are consistent with abnormal   left ventricular relaxation (grade 1 diastolic  dysfunction).   There was no evidence of elevated ventricular filling pressure by   Doppler parameters. - Aortic valve: There was no regurgitation. - Mitral valve: Calcified annulus. Mildly thickened leaflets .   There was trivial regurgitation. Valve area by pressure   half-time: 2.37 cm^2. - Right ventricle: The cavity size was normal. Wall thickness was   normal. Systolic function was normal. - Right atrium: The atrium was normal in size. - Tricuspid valve: There was mild regurgitation. - Pulmonary arteries: Systolic  pressure was within the normal   range. - Inferior vena cava: The vessel was normal in size. - Pericardium, extracardiac: There was no pericardial effusion.  Impressions:  - No cardiac source of emboli was indentified.  Past Medical History:  Diagnosis Date  . Colon polyp   . Complication of anesthesia    Difficult to arouse, Combative x 1  . Diverticulitis   . Diverticulosis    extensive 3'15.  . Dysrhythmia    hx. Ablation for tachycardia, no routine cardiology visits now  . ED (erectile dysfunction)   . Heart murmur    teenager  . History of colon polyps   . History of kidney stones    x1 passed  . Hyperlipidemia   . Myasthenia gravis ([place])   . Neuromuscular disorder ([place])    "neuroma"   . Osteoarthritis   . Palpitation    with svt rate 150  . Tendinitis    achiles heel spur  . Testicular atrophy   . Tubular adenoma of colon     Past Surgical History:  Procedure Laterality Date  . ABLATION  2010   cardiac   . APPENDECTOMY    . COLONOSCOPY    . EUS N/A 11/17/2014   Procedure: ESOPHAGEAL ENDOSCOPIC ULTRASOUND (EUS) RADIAL;  Surgeon: [name], MD;  Location: [facility];  Service: Endoscopy;  Laterality: N/A;  . FINE NEEDLE ASPIRATION N/A 11/17/2014   Procedure: FINE NEEDLE ASPIRATION (FNA) LINEAR;  Surgeon: [name], MD;  Location: [facility];  Service: Endoscopy;  Laterality: N/A;  + or- fna  . FLEXIBLE SIGMOIDOSCOPY    . HEMORRHOID SURGERY     banding   . HERNIA REPAIR Bilateral    '05 bilateral hernia  . KNEE ARTHROSCOPY Left    scope '08  . KNEE ARTHROSCOPY    . LAPAROSCOPIC APPENDECTOMY N/A 09/05/2014   Procedure: APPENDECTOMY LAPAROSCOPIC;  Surgeon: [name], MD;  Location: [facility];  Service: General;  Laterality: N/A;  . ROTATOR CUFF REPAIR Right 2016    MEDICATIONS: . aspirin 81 MG tablet  . atorvastatin (LIPITOR) 10 MG tablet  . Calcium Carbonate-Vitamin D (CALCIUM 600+D) 600-400 MG-UNIT per tablet  . Cyanocobalamin  (VITAMIN B-12 PO)  . doxazosin (CARDURA) 8 MG tablet  . feeding supplement, ENSURE ENLIVE, (ENSURE ENLIVE) LIQD  . finasteride (PROSCAR) 5 MG tablet  . Glucosamine HCl 1500 MG TABS  . ibuprofen (ADVIL,MOTRIN) 200 MG tablet  . Multiple Vitamin (MULTIVITAMIN WITH MINERALS) TABS tablet  . predniSONE (DELTASONE) 10 MG tablet  . pyridostigmine (MESTINON) 60 MG tablet   No current facility-administered medications for this encounter.    Derrill Memo ON 12/26/2018] bupivacaine liposome (EXPAREL) 1.3 % injection 266 mg  . [START ON 12/26/2018] clindamycin (CLEOCIN) 900 mg, gentamicin (GARAMYCIN) 240 mg in sodium chloride 0.9 % 1,000 mL for intraperitoneal lavage     Maia Plan [facility] Pre-Surgical Testing [phone] 12/25/18 10:15 AM

## 2018-12-26 ENCOUNTER — Encounter (HOSPITAL_COMMUNITY): Payer: Self-pay | Admitting: Surgery

## 2018-12-26 ENCOUNTER — Other Ambulatory Visit: Payer: Self-pay

## 2018-12-26 ENCOUNTER — Inpatient Hospital Stay (HOSPITAL_COMMUNITY): Payer: Medicare Other | Admitting: Certified Registered Nurse Anesthetist

## 2018-12-26 ENCOUNTER — Encounter (HOSPITAL_COMMUNITY)
Admission: RE | Disposition: A | Discharge status: Home / Self Care | Payer: Self-pay | Source: Home / Self Care | Attending: Surgery

## 2018-12-26 ENCOUNTER — Inpatient Hospital Stay (HOSPITAL_COMMUNITY): Payer: Medicare Other | Admitting: Physician Assistant

## 2018-12-26 ENCOUNTER — Inpatient Hospital Stay (HOSPITAL_COMMUNITY)
Admission: RE | Admit: 2018-12-26 | Discharge: 2018-12-29 | DRG: 331 | Disposition: A | Discharge status: Home / Self Care | Payer: Medicare Other | Attending: Surgery | Admitting: Surgery

## 2018-12-26 DIAGNOSIS — K5720 Diverticulitis of large intestine with perforation and abscess without bleeding: Secondary | ICD-10-CM

## 2018-12-26 DIAGNOSIS — K635 Polyp of colon: Secondary | ICD-10-CM | POA: Diagnosis not present

## 2018-12-26 DIAGNOSIS — J449 Chronic obstructive pulmonary disease, unspecified: Secondary | ICD-10-CM | POA: Diagnosis not present

## 2018-12-26 DIAGNOSIS — K5732 Diverticulitis of large intestine without perforation or abscess without bleeding: Principal | ICD-10-CM | POA: Diagnosis present

## 2018-12-26 DIAGNOSIS — K5730 Diverticulosis of large intestine without perforation or abscess without bleeding: Secondary | ICD-10-CM | POA: Diagnosis present

## 2018-12-26 DIAGNOSIS — R319 Hematuria, unspecified: Secondary | ICD-10-CM | POA: Diagnosis present

## 2018-12-26 DIAGNOSIS — Z7952 Long term (current) use of systemic steroids: Secondary | ICD-10-CM

## 2018-12-26 DIAGNOSIS — Z7982 Long term (current) use of aspirin: Secondary | ICD-10-CM | POA: Diagnosis not present

## 2018-12-26 DIAGNOSIS — Z87891 Personal history of nicotine dependence: Secondary | ICD-10-CM

## 2018-12-26 DIAGNOSIS — Z79899 Other long term (current) drug therapy: Secondary | ICD-10-CM

## 2018-12-26 DIAGNOSIS — G7000 Myasthenia gravis without (acute) exacerbation: Secondary | ICD-10-CM | POA: Diagnosis present

## 2018-12-26 DIAGNOSIS — N401 Enlarged prostate with lower urinary tract symptoms: Secondary | ICD-10-CM | POA: Diagnosis not present

## 2018-12-26 DIAGNOSIS — E785 Hyperlipidemia, unspecified: Secondary | ICD-10-CM | POA: Diagnosis present

## 2018-12-26 DIAGNOSIS — Z8719 Personal history of other diseases of the digestive system: Secondary | ICD-10-CM

## 2018-12-26 DIAGNOSIS — N400 Enlarged prostate without lower urinary tract symptoms: Secondary | ICD-10-CM | POA: Diagnosis present

## 2018-12-26 DIAGNOSIS — K660 Peritoneal adhesions (postprocedural) (postinfection): Secondary | ICD-10-CM | POA: Diagnosis present

## 2018-12-26 DIAGNOSIS — I10 Essential (primary) hypertension: Secondary | ICD-10-CM | POA: Diagnosis not present

## 2018-12-26 DIAGNOSIS — G609 Hereditary and idiopathic neuropathy, unspecified: Secondary | ICD-10-CM | POA: Diagnosis present

## 2018-12-26 DIAGNOSIS — Z86010 Personal history of colonic polyps: Secondary | ICD-10-CM

## 2018-12-26 HISTORY — DX: Diverticulitis of large intestine with perforation and abscess without bleeding: K57.20

## 2018-12-26 HISTORY — DX: Unspecified appendicitis: K37

## 2018-12-26 HISTORY — PX: PROCTOSCOPY: SHX2266

## 2018-12-26 HISTORY — DX: Other specified cardiac arrhythmias: I49.8

## 2018-12-26 SURGERY — COLECTOMY, PARTIAL, ROBOT-ASSISTED, LAPAROSCOPIC
Anesthesia: General

## 2018-12-26 MED ORDER — ROCURONIUM BROMIDE 100 MG/10ML IV SOLN
INTRAVENOUS | Status: AC
  Filled 2018-12-26: qty 1

## 2018-12-26 MED ORDER — GLUCOSAMINE HCL 1500 MG PO TABS: 1500.0000 mg | ORAL_TABLET | Freq: Every day | ORAL | Status: DC

## 2018-12-26 MED ORDER — SACCHAROMYCES BOULARDII 250 MG PO CAPS
250.0000 mg | ORAL_CAPSULE | Freq: Two times a day (BID) | ORAL | Status: DC
  Administered 2018-12-26 – 2018-12-28 (×5): 250 mg via ORAL
  Filled 2018-12-26 (×5): qty 1

## 2018-12-26 MED ORDER — BUPIVACAINE HCL (PF) 0.25 % IJ SOLN
INTRAMUSCULAR | Status: DC | PRN
  Administered 2018-12-26: 60 mL

## 2018-12-26 MED ORDER — BUPIVACAINE HCL (PF) 0.25 % IJ SOLN
INTRAMUSCULAR | Status: AC
  Filled 2018-12-26: qty 30

## 2018-12-26 MED ORDER — NEOMYCIN SULFATE 500 MG PO TABS
1000.0000 mg | ORAL_TABLET | ORAL | Status: DC
  Filled 2018-12-26: qty 2

## 2018-12-26 MED ORDER — SUGAMMADEX SODIUM 200 MG/2ML IV SOLN
INTRAVENOUS | Status: AC
  Filled 2018-12-26: qty 2

## 2018-12-26 MED ORDER — ALVIMOPAN 12 MG PO CAPS
12.0000 mg | ORAL_CAPSULE | ORAL | Status: AC
  Administered 2018-12-26: 12 mg via ORAL
  Filled 2018-12-26: qty 1

## 2018-12-26 MED ORDER — GABAPENTIN 300 MG PO CAPS
300.0000 mg | ORAL_CAPSULE | Freq: Two times a day (BID) | ORAL | Status: DC
  Administered 2018-12-26 – 2018-12-28 (×5): 300 mg via ORAL
  Filled 2018-12-26 (×5): qty 1

## 2018-12-26 MED ORDER — PHENYLEPHRINE 40 MCG/ML (10ML) SYRINGE FOR IV PUSH (FOR BLOOD PRESSURE SUPPORT)
PREFILLED_SYRINGE | INTRAVENOUS | Status: DC | PRN
  Administered 2018-12-26 (×2): 80 ug via INTRAVENOUS
  Administered 2018-12-26: 120 ug via INTRAVENOUS

## 2018-12-26 MED ORDER — METRONIDAZOLE 500 MG PO TABS
1000.0000 mg | ORAL_TABLET | ORAL | Status: DC
  Filled 2018-12-26: qty 2

## 2018-12-26 MED ORDER — PHENYLEPHRINE HCL 10 MG/ML IJ SOLN
INTRAMUSCULAR | Status: AC
  Filled 2018-12-26: qty 2

## 2018-12-26 MED ORDER — ENSURE SURGERY PO LIQD
237.0000 mL | Freq: Two times a day (BID) | ORAL | Status: DC
  Administered 2018-12-27 – 2018-12-28 (×3): 237 mL via ORAL
  Filled 2018-12-26 (×6): qty 237

## 2018-12-26 MED ORDER — DOXAZOSIN MESYLATE 2 MG PO TABS
8.0000 mg | ORAL_TABLET | Freq: Every day | ORAL | Status: DC
  Administered 2018-12-27 – 2018-12-28 (×2): 8 mg via ORAL
  Filled 2018-12-26 (×2): qty 4

## 2018-12-26 MED ORDER — LIDOCAINE HCL 2 % IJ SOLN
INTRAMUSCULAR | Status: AC
  Filled 2018-12-26: qty 20

## 2018-12-26 MED ORDER — 0.9 % SODIUM CHLORIDE (POUR BTL) OPTIME
TOPICAL | Status: DC | PRN
  Administered 2018-12-26: 2000 mL

## 2018-12-26 MED ORDER — MAGIC MOUTHWASH
15.0000 mL | Freq: Four times a day (QID) | ORAL | Status: DC | PRN
  Filled 2018-12-26: qty 15

## 2018-12-26 MED ORDER — ADULT MULTIVITAMIN W/MINERALS CH
1.0000 | ORAL_TABLET | Freq: Every day | ORAL | Status: DC
  Administered 2018-12-27 – 2018-12-28 (×2): 1 via ORAL
  Filled 2018-12-26 (×2): qty 1

## 2018-12-26 MED ORDER — PYRIDOSTIGMINE BROMIDE 60 MG PO TABS
30.0000 mg | ORAL_TABLET | ORAL | Status: DC
  Filled 2018-12-26: qty 0.5

## 2018-12-26 MED ORDER — ATORVASTATIN CALCIUM 10 MG PO TABS
10.0000 mg | ORAL_TABLET | Freq: Every day | ORAL | Status: DC
  Administered 2018-12-26 – 2018-12-28 (×3): 10 mg via ORAL
  Filled 2018-12-26 (×3): qty 1

## 2018-12-26 MED ORDER — ENALAPRILAT 1.25 MG/ML IV SOLN
0.6250 mg | Freq: Four times a day (QID) | INTRAVENOUS | Status: DC | PRN
  Filled 2018-12-26: qty 1

## 2018-12-26 MED ORDER — PREDNISONE 10 MG PO TABS
10.0000 mg | ORAL_TABLET | Freq: Every day | ORAL | Status: DC
  Administered 2018-12-27 – 2018-12-29 (×3): 10 mg via ORAL
  Filled 2018-12-26 (×3): qty 1

## 2018-12-26 MED ORDER — SUGAMMADEX SODIUM 200 MG/2ML IV SOLN
INTRAVENOUS | Status: DC | PRN
  Administered 2018-12-26: 200 mg via INTRAVENOUS

## 2018-12-26 MED ORDER — TRAMADOL HCL 50 MG PO TABS
50.0000 mg | ORAL_TABLET | Freq: Four times a day (QID) | ORAL | Status: DC | PRN
  Administered 2018-12-27 – 2018-12-28 (×4): 50 mg via ORAL
  Filled 2018-12-26 (×4): qty 1

## 2018-12-26 MED ORDER — ACETAMINOPHEN 500 MG PO TABS
1000.0000 mg | ORAL_TABLET | ORAL | Status: AC
  Administered 2018-12-26: 1000 mg via ORAL
  Filled 2018-12-26: qty 2

## 2018-12-26 MED ORDER — ONDANSETRON HCL 4 MG/2ML IJ SOLN
INTRAMUSCULAR | Status: AC
  Filled 2018-12-26: qty 2

## 2018-12-26 MED ORDER — EPHEDRINE 5 MG/ML INJ
INTRAVENOUS | Status: AC
  Filled 2018-12-26: qty 10

## 2018-12-26 MED ORDER — LACTATED RINGERS IV SOLN
INTRAVENOUS | Status: DC
  Administered 2018-12-26 (×4): via INTRAVENOUS

## 2018-12-26 MED ORDER — METOPROLOL TARTRATE 5 MG/5ML IV SOLN: 5.0000 mg | Freq: Four times a day (QID) | INTRAVENOUS | Status: DC | PRN

## 2018-12-26 MED ORDER — CALCIUM CARBONATE-VITAMIN D 500-200 MG-UNIT PO TABS
1.0000 | ORAL_TABLET | Freq: Every day | ORAL | Status: DC
  Administered 2018-12-27 – 2018-12-28 (×2): 1 via ORAL
  Filled 2018-12-26 (×2): qty 1

## 2018-12-26 MED ORDER — PROPOFOL 10 MG/ML IV BOLUS
INTRAVENOUS | Status: DC | PRN
  Administered 2018-12-26 (×2): 50 mg via INTRAVENOUS
  Administered 2018-12-26: 130 mg via INTRAVENOUS

## 2018-12-26 MED ORDER — SODIUM CHLORIDE 0.9 % IV SOLN
2.0000 g | INTRAVENOUS | Status: AC
  Administered 2018-12-26: 2 g via INTRAVENOUS
  Filled 2018-12-26: qty 2

## 2018-12-26 MED ORDER — DIPHENHYDRAMINE HCL 50 MG/ML IJ SOLN: 12.5000 mg | Freq: Four times a day (QID) | INTRAMUSCULAR | Status: DC | PRN

## 2018-12-26 MED ORDER — ALVIMOPAN 12 MG PO CAPS
12.0000 mg | ORAL_CAPSULE | Freq: Two times a day (BID) | ORAL | Status: DC
  Administered 2018-12-27: 12 mg via ORAL
  Filled 2018-12-26: qty 1

## 2018-12-26 MED ORDER — LACTATED RINGERS IR SOLN
Status: DC | PRN
  Administered 2018-12-26: 1000 mL

## 2018-12-26 MED ORDER — FINASTERIDE 5 MG PO TABS
5.0000 mg | ORAL_TABLET | Freq: Every morning | ORAL | Status: DC
  Administered 2018-12-27 – 2018-12-28 (×2): 5 mg via ORAL
  Filled 2018-12-26 (×2): qty 1

## 2018-12-26 MED ORDER — ONDANSETRON HCL 4 MG PO TABS: 4.0000 mg | ORAL_TABLET | Freq: Four times a day (QID) | ORAL | Status: DC | PRN

## 2018-12-26 MED ORDER — PROCHLORPERAZINE MALEATE 10 MG PO TABS: 10.0000 mg | ORAL_TABLET | Freq: Four times a day (QID) | ORAL | Status: DC | PRN

## 2018-12-26 MED ORDER — GABAPENTIN 300 MG PO CAPS
300.0000 mg | ORAL_CAPSULE | ORAL | Status: AC
  Administered 2018-12-26: 300 mg via ORAL
  Filled 2018-12-26: qty 1

## 2018-12-26 MED ORDER — SODIUM CHLORIDE 0.9 % IV SOLN
INTRAVENOUS | Status: DC | PRN
  Administered 2018-12-26: 1000 mL via INTRAPERITONEAL

## 2018-12-26 MED ORDER — POLYETHYLENE GLYCOL 3350 17 GM/SCOOP PO POWD
1.0000 | Freq: Once | ORAL | Status: DC
  Filled 2018-12-26: qty 255

## 2018-12-26 MED ORDER — SODIUM CHLORIDE 0.9 % IV SOLN
INTRAVENOUS | Status: DC | PRN
  Administered 2018-12-26: 40 ug/min via INTRAVENOUS

## 2018-12-26 MED ORDER — FENTANYL CITRATE (PF) 100 MCG/2ML IJ SOLN
INTRAMUSCULAR | Status: AC
  Filled 2018-12-26: qty 2

## 2018-12-26 MED ORDER — SODIUM CHLORIDE 0.9 % IV SOLN: 1000.0000 mL | Freq: Three times a day (TID) | INTRAVENOUS | Status: AC | PRN

## 2018-12-26 MED ORDER — BISACODYL 5 MG PO TBEC
20.0000 mg | DELAYED_RELEASE_TABLET | Freq: Once | ORAL | Status: DC
  Filled 2018-12-26: qty 4

## 2018-12-26 MED ORDER — PHENYLEPHRINE 40 MCG/ML (10ML) SYRINGE FOR IV PUSH (FOR BLOOD PRESSURE SUPPORT)
PREFILLED_SYRINGE | INTRAVENOUS | Status: AC
  Filled 2018-12-26: qty 10

## 2018-12-26 MED ORDER — ALUM & MAG HYDROXIDE-SIMETH 200-200-20 MG/5ML PO SUSP: 30.0000 mL | Freq: Four times a day (QID) | ORAL | Status: DC | PRN

## 2018-12-26 MED ORDER — METOCLOPRAMIDE HCL 5 MG/ML IJ SOLN: 10.0000 mg | Freq: Four times a day (QID) | INTRAMUSCULAR | Status: DC | PRN

## 2018-12-26 MED ORDER — ENOXAPARIN SODIUM 40 MG/0.4ML ~~LOC~~ SOLN
40.0000 mg | Freq: Once | SUBCUTANEOUS | Status: AC
  Administered 2018-12-26: 40 mg via SUBCUTANEOUS
  Filled 2018-12-26: qty 0.4

## 2018-12-26 MED ORDER — IBUPROFEN 200 MG PO TABS: 200.0000 mg | ORAL_TABLET | Freq: Four times a day (QID) | ORAL | Status: DC | PRN

## 2018-12-26 MED ORDER — SODIUM CHLORIDE 0.9 % IV SOLN
2.0000 g | Freq: Two times a day (BID) | INTRAVENOUS | Status: AC
  Administered 2018-12-27: 2 g via INTRAVENOUS
  Filled 2018-12-26: qty 2

## 2018-12-26 MED ORDER — LIDOCAINE 2% (20 MG/ML) 5 ML SYRINGE
INTRAMUSCULAR | Status: AC
  Filled 2018-12-26: qty 5

## 2018-12-26 MED ORDER — PROPOFOL 10 MG/ML IV BOLUS
INTRAVENOUS | Status: AC
  Filled 2018-12-26: qty 20

## 2018-12-26 MED ORDER — LIP MEDEX EX OINT
1.0000 "application " | TOPICAL_OINTMENT | Freq: Two times a day (BID) | CUTANEOUS | Status: DC
  Administered 2018-12-26 – 2018-12-28 (×5): 1 via TOPICAL
  Filled 2018-12-26: qty 7

## 2018-12-26 MED ORDER — HYDRALAZINE HCL 20 MG/ML IJ SOLN: 10.0000 mg | INTRAMUSCULAR | Status: DC | PRN

## 2018-12-26 MED ORDER — ONDANSETRON HCL 4 MG/2ML IJ SOLN: 4.0000 mg | Freq: Once | INTRAMUSCULAR | Status: DC | PRN

## 2018-12-26 MED ORDER — BUPIVACAINE LIPOSOME 1.3 % IJ SUSP
INTRAMUSCULAR | Status: DC | PRN
  Administered 2018-12-26: 20 mL

## 2018-12-26 MED ORDER — ONDANSETRON HCL 4 MG/2ML IJ SOLN: 4.0000 mg | Freq: Four times a day (QID) | INTRAMUSCULAR | Status: DC | PRN

## 2018-12-26 MED ORDER — LIDOCAINE 20MG/ML (2%) 15 ML SYRINGE OPTIME
INTRAMUSCULAR | Status: DC | PRN
  Administered 2018-12-26: 1.5 mg/kg/h via INTRAVENOUS

## 2018-12-26 MED ORDER — EPHEDRINE SULFATE-NACL 50-0.9 MG/10ML-% IV SOSY
PREFILLED_SYRINGE | INTRAVENOUS | Status: DC | PRN
  Administered 2018-12-26: 7.5 mg via INTRAVENOUS

## 2018-12-26 MED ORDER — INDOCYANINE GREEN 25 MG IV SOLR
INTRAVENOUS | Status: DC | PRN
  Administered 2018-12-26 (×2): 5 mg via INTRAVENOUS

## 2018-12-26 MED ORDER — HYDROMORPHONE HCL 1 MG/ML IJ SOLN: 0.5000 mg | INTRAMUSCULAR | Status: DC | PRN

## 2018-12-26 MED ORDER — ASPIRIN EC 81 MG PO TBEC
81.0000 mg | DELAYED_RELEASE_TABLET | Freq: Every day | ORAL | Status: DC
  Administered 2018-12-26 – 2018-12-28 (×3): 81 mg via ORAL
  Filled 2018-12-26 (×3): qty 1

## 2018-12-26 MED ORDER — MEPERIDINE HCL 50 MG/ML IJ SOLN: 6.2500 mg | INTRAMUSCULAR | Status: DC | PRN

## 2018-12-26 MED ORDER — FENTANYL CITRATE (PF) 100 MCG/2ML IJ SOLN
INTRAMUSCULAR | Status: DC | PRN
  Administered 2018-12-26 (×3): 50 ug via INTRAVENOUS
  Administered 2018-12-26: 100 ug via INTRAVENOUS
  Administered 2018-12-26 (×3): 50 ug via INTRAVENOUS

## 2018-12-26 MED ORDER — DIPHENHYDRAMINE HCL 12.5 MG/5ML PO ELIX: 12.5000 mg | ORAL_SOLUTION | Freq: Four times a day (QID) | ORAL | Status: DC | PRN

## 2018-12-26 MED ORDER — PYRIDOSTIGMINE BROMIDE 60 MG PO TABS
30.0000 mg | ORAL_TABLET | Freq: Three times a day (TID) | ORAL | Status: DC
  Administered 2018-12-26 – 2018-12-29 (×8): 30 mg via ORAL
  Filled 2018-12-26 (×9): qty 0.5

## 2018-12-26 MED ORDER — PROCHLORPERAZINE EDISYLATE 10 MG/2ML IJ SOLN: 5.0000 mg | Freq: Four times a day (QID) | INTRAMUSCULAR | Status: DC | PRN

## 2018-12-26 MED ORDER — LIDOCAINE 2% (20 MG/ML) 5 ML SYRINGE
INTRAMUSCULAR | Status: DC | PRN
  Administered 2018-12-26: 60 mg via INTRAVENOUS

## 2018-12-26 MED ORDER — DEXAMETHASONE SODIUM PHOSPHATE 10 MG/ML IJ SOLN
INTRAMUSCULAR | Status: AC
  Filled 2018-12-26: qty 1

## 2018-12-26 MED ORDER — ROCURONIUM BROMIDE 10 MG/ML (PF) SYRINGE
PREFILLED_SYRINGE | INTRAVENOUS | Status: DC | PRN
  Administered 2018-12-26: 30 mg via INTRAVENOUS

## 2018-12-26 MED ORDER — HYDROMORPHONE HCL 1 MG/ML IJ SOLN
INTRAMUSCULAR | Status: AC
  Filled 2018-12-26: qty 1

## 2018-12-26 MED ORDER — LACTATED RINGERS IV SOLN: INTRAVENOUS | Status: DC

## 2018-12-26 MED ORDER — DEXAMETHASONE SODIUM PHOSPHATE 4 MG/ML IJ SOLN
INTRAMUSCULAR | Status: DC | PRN
  Administered 2018-12-26: 10 mg via INTRAVENOUS

## 2018-12-26 MED ORDER — ACETAMINOPHEN 500 MG PO TABS
1000.0000 mg | ORAL_TABLET | Freq: Four times a day (QID) | ORAL | Status: DC
  Administered 2018-12-26 – 2018-12-29 (×9): 1000 mg via ORAL
  Filled 2018-12-26 (×9): qty 2

## 2018-12-26 MED ORDER — ONDANSETRON HCL 4 MG/2ML IJ SOLN
INTRAMUSCULAR | Status: DC | PRN
  Administered 2018-12-26: 4 mg via INTRAVENOUS

## 2018-12-26 MED ORDER — HYDROMORPHONE HCL 1 MG/ML IJ SOLN
0.2500 mg | INTRAMUSCULAR | Status: DC | PRN
  Administered 2018-12-26 (×2): 0.5 mg via INTRAVENOUS

## 2018-12-26 MED ORDER — ENOXAPARIN SODIUM 40 MG/0.4ML ~~LOC~~ SOLN
40.0000 mg | SUBCUTANEOUS | Status: DC
  Administered 2018-12-27 – 2018-12-29 (×3): 40 mg via SUBCUTANEOUS
  Filled 2018-12-26 (×3): qty 0.4

## 2018-12-26 SURGICAL SUPPLY — 104 items
APPLIER CLIP 5 13 M/L LIGAMAX5 (MISCELLANEOUS)
APPLIER CLIP ROT 10 11.4 M/L (STAPLE)
APR CLP MED LRG 11.4X10 (STAPLE)
APR CLP MED LRG 5 ANG JAW (MISCELLANEOUS)
BLADE EXTENDED COATED 6.5IN (ELECTRODE) ×2 IMPLANT
CANNULA REDUC XI 12-8 STAPL (CANNULA) ×1
CANNULA REDUCER 12-8 DVNC XI (CANNULA) ×1 IMPLANT
CELLS DAT CNTRL 66122 CELL SVR (MISCELLANEOUS) IMPLANT
CHLORAPREP W/TINT 26ML (MISCELLANEOUS) ×2 IMPLANT
CLIP APPLIE 5 13 M/L LIGAMAX5 (MISCELLANEOUS) IMPLANT
CLIP APPLIE ROT 10 11.4 M/L (STAPLE) IMPLANT
CLIP VESOLOCK LG 6/CT PURPLE (CLIP) IMPLANT
CLIP VESOLOCK MED LG 6/CT (CLIP) IMPLANT
COVER SURGICAL LIGHT HANDLE (MISCELLANEOUS) ×4 IMPLANT
COVER TIP SHEARS 8 DVNC (MISCELLANEOUS) ×1 IMPLANT
COVER TIP SHEARS 8MM DA VINCI (MISCELLANEOUS) ×1
COVER WAND RF STERILE (DRAPES) IMPLANT
DECANTER SPIKE VIAL GLASS SM (MISCELLANEOUS) ×2 IMPLANT
DEVICE TROCAR PUNCTURE CLOSURE (ENDOMECHANICALS) IMPLANT
DRAIN CHANNEL 19F RND (DRAIN) ×2 IMPLANT
DRAPE ARM DVNC X/XI (DISPOSABLE) ×4 IMPLANT
DRAPE COLUMN DVNC XI (DISPOSABLE) ×1 IMPLANT
DRAPE DA VINCI XI ARM (DISPOSABLE) ×4
DRAPE DA VINCI XI COLUMN (DISPOSABLE) ×1
DRAPE SURG IRRIG POUCH 19X23 (DRAPES) ×2 IMPLANT
DRSG OPSITE POSTOP 3X4 (GAUZE/BANDAGES/DRESSINGS) ×1 IMPLANT
DRSG OPSITE POSTOP 4X10 (GAUZE/BANDAGES/DRESSINGS) IMPLANT
DRSG OPSITE POSTOP 4X6 (GAUZE/BANDAGES/DRESSINGS) IMPLANT
DRSG OPSITE POSTOP 4X8 (GAUZE/BANDAGES/DRESSINGS) IMPLANT
DRSG TEGADERM 2-3/8X2-3/4 SM (GAUZE/BANDAGES/DRESSINGS) ×6 IMPLANT
DRSG TEGADERM 4X4.75 (GAUZE/BANDAGES/DRESSINGS) ×2 IMPLANT
ELECT PENCIL ROCKER SW 15FT (MISCELLANEOUS) ×2 IMPLANT
ELECT REM PT RETURN 15FT ADLT (MISCELLANEOUS) ×2 IMPLANT
ENDOLOOP SUT PDS II  0 18 (SUTURE)
ENDOLOOP SUT PDS II 0 18 (SUTURE) IMPLANT
EVACUATOR SILICONE 100CC (DRAIN) ×2 IMPLANT
GAUZE SPONGE 2X2 8PLY STRL LF (GAUZE/BANDAGES/DRESSINGS) ×1 IMPLANT
GAUZE SPONGE 4X4 12PLY STRL (GAUZE/BANDAGES/DRESSINGS) IMPLANT
GLOVE ECLIPSE 8.0 STRL XLNG CF (GLOVE) ×10 IMPLANT
GLOVE INDICATOR 8.0 STRL GRN (GLOVE) ×10 IMPLANT
GOWN STRL REUS W/TWL XL LVL3 (GOWN DISPOSABLE) ×10 IMPLANT
GRASPER SUT TROCAR 14GX15 (MISCELLANEOUS) ×2 IMPLANT
HOLDER FOLEY CATH W/STRAP (MISCELLANEOUS) ×2 IMPLANT
IRRIG SUCT STRYKERFLOW 2 WTIP (MISCELLANEOUS)
IRRIGATION SUCT STRKRFLW 2 WTP (MISCELLANEOUS) IMPLANT
KIT PROCEDURE DA VINCI SI (MISCELLANEOUS) ×1
KIT PROCEDURE DVNC SI (MISCELLANEOUS) ×1 IMPLANT
KIT SIGMOIDOSCOPE (SET/KITS/TRAYS/PACK) ×1 IMPLANT
NDL INSUFFLATION 14GA 120MM (NEEDLE) ×1 IMPLANT
NEEDLE INSUFFLATION 14GA 120MM (NEEDLE) ×2 IMPLANT
PACK CARDIOVASCULAR III (CUSTOM PROCEDURE TRAY) ×2 IMPLANT
PACK COLON (CUSTOM PROCEDURE TRAY) ×2 IMPLANT
PAD POSITIONING PINK XL (MISCELLANEOUS) ×2 IMPLANT
PORT LAP GEL ALEXIS MED 5-9CM (MISCELLANEOUS) ×2 IMPLANT
PROTECTOR NERVE ULNAR (MISCELLANEOUS) ×4 IMPLANT
RELOAD STAPLE 45 BLU REG DVNC (STAPLE) IMPLANT
RELOAD STAPLE 45 GRN THCK DVNC (STAPLE) IMPLANT
RETRACTOR WND ALEXIS 18 MED (MISCELLANEOUS) IMPLANT
RTRCTR WOUND ALEXIS 18CM MED (MISCELLANEOUS)
SCISSORS LAP 5X35 DISP (ENDOMECHANICALS) ×2 IMPLANT
SEAL CANN UNIV 5-8 DVNC XI (MISCELLANEOUS) ×4 IMPLANT
SEAL XI 5MM-8MM UNIVERSAL (MISCELLANEOUS) ×4
SEALER VESSEL DA VINCI XI (MISCELLANEOUS) ×1
SEALER VESSEL EXT DVNC XI (MISCELLANEOUS) ×1 IMPLANT
SLEEVE ADV FIXATION 5X100MM (TROCAR) IMPLANT
SOLUTION ELECTROLUBE (MISCELLANEOUS) ×2 IMPLANT
SPONGE GAUZE 2X2 STER 10/PKG (GAUZE/BANDAGES/DRESSINGS) ×1
STAPLER 45 BLU RELOAD XI (STAPLE) IMPLANT
STAPLER 45 BLUE RELOAD XI (STAPLE)
STAPLER 45 GREEN RELOAD XI (STAPLE)
STAPLER 45 GRN RELOAD XI (STAPLE) IMPLANT
STAPLER CANNULA SEAL DVNC XI (STAPLE) ×1 IMPLANT
STAPLER CANNULA SEAL XI (STAPLE) ×1
STAPLER ECHELON POWER CIR 31 (STAPLE) ×1 IMPLANT
STAPLER SHEATH (SHEATH) ×1
STAPLER SHEATH ENDOWRIST DVNC (SHEATH) ×1 IMPLANT
SURGILUBE 2OZ TUBE FLIPTOP (MISCELLANEOUS) ×2 IMPLANT
SUT MNCRL AB 4-0 PS2 18 (SUTURE) ×2 IMPLANT
SUT PDS AB 1 CTX 36 (SUTURE) ×2 IMPLANT
SUT PDS AB 1 TP1 96 (SUTURE) IMPLANT
SUT PROLENE 0 CT 2 (SUTURE) IMPLANT
SUT PROLENE 2 0 KS (SUTURE) IMPLANT
SUT PROLENE 2 0 SH DA (SUTURE) IMPLANT
SUT SILK 2 0 (SUTURE)
SUT SILK 2 0 SH CR/8 (SUTURE) IMPLANT
SUT SILK 2-0 18XBRD TIE 12 (SUTURE) IMPLANT
SUT SILK 3 0 (SUTURE) ×2
SUT SILK 3 0 SH CR/8 (SUTURE) ×2 IMPLANT
SUT SILK 3-0 18XBRD TIE 12 (SUTURE) ×1 IMPLANT
SUT V-LOC BARB 180 2/0GR6 GS22 (SUTURE)
SUT VIC AB 3-0 SH 18 (SUTURE) IMPLANT
SUT VIC AB 3-0 SH 27 (SUTURE)
SUT VIC AB 3-0 SH 27XBRD (SUTURE) IMPLANT
SUT VICRYL 0 UR6 27IN ABS (SUTURE) ×2 IMPLANT
SUTURE V-LC BRB 180 2/0GR6GS22 (SUTURE) IMPLANT
SYR 10ML LL (SYRINGE) ×2 IMPLANT
SYS LAPSCP GELPORT 120MM (MISCELLANEOUS)
SYSTEM LAPSCP GELPORT 120MM (MISCELLANEOUS) IMPLANT
TAPE UMBILICAL COTTON 1/8X30 (MISCELLANEOUS) ×2 IMPLANT
TOWEL OR NON WOVEN STRL DISP B (DISPOSABLE) ×2 IMPLANT
TRAY FOLEY MTR SLVR 16FR STAT (SET/KITS/TRAYS/PACK) ×2 IMPLANT
TROCAR ADV FIXATION 5X100MM (TROCAR) ×2 IMPLANT
TUBING CONNECTING 10 (TUBING) ×4 IMPLANT
TUBING INSUFFLATION 10FT LAP (TUBING) ×2 IMPLANT

## 2018-12-26 NOTE — Anesthesia Procedure Notes (Signed)
Procedure Name: Intubation Date/Time: 12/26/2018 12:40 PM Performed by: Claudia Desanctis, CRNA Pre-anesthesia Checklist: Patient identified, Emergency Drugs available, Suction available and Patient being monitored Patient Re-evaluated:Patient Re-evaluated prior to induction Oxygen Delivery Method: Circle system utilized Preoxygenation: Pre-oxygenation with 100% oxygen Induction Type: IV induction Ventilation: Mask ventilation without difficulty and Oral airway inserted - appropriate to patient size Laryngoscope Size: 2 and Miller Grade View: Grade I Tube type: Oral Tube size: 8.0 mm Number of attempts: 1 Airway Equipment and Method: Stylet Placement Confirmation: ETT inserted through vocal cords under direct vision,  positive ETCO2 and breath sounds checked- equal and bilateral Secured at: 22 cm Tube secured with: Tape Dental Injury: Teeth and Oropharynx as per pre-operative assessment

## 2018-12-26 NOTE — Discharge Instructions (Signed)
SURGERY: POST OP INSTRUCTIONS °(Surgery for small bowel obstruction, colon resection, etc) ° ° °###################################################################### ° °EAT °Gradually transition to a high fiber diet with a fiber supplement over the next few days after discharge ° °WALK °Walk an hour a day.  Control your pain to do that.   ° °CONTROL PAIN °Control pain so that you can walk, sleep, tolerate sneezing/coughing, go up/down stairs. ° °HAVE A BOWEL MOVEMENT DAILY °Keep your bowels regular to avoid problems.  OK to try a laxative to override constipation.  OK to use an antidairrheal to slow down diarrhea.  Call if not better after 2 tries ° °CALL IF YOU HAVE PROBLEMS/CONCERNS °Call if you are still struggling despite following these instructions. °Call if you have concerns not answered by these instructions ° °###################################################################### ° ° °DIET °Follow a light diet the first few days at home.  Start with a bland diet such as soups, liquids, starchy foods, low fat foods, etc.  If you feel full, bloated, or constipated, stay on a ful liquid or pureed/blenderized diet for a few days until you feel better and no longer constipated. °Be sure to drink plenty of fluids every day to avoid getting dehydrated (feeling dizzy, not urinating, etc.). °Gradually add a fiber supplement to your diet over the next week.  Gradually get back to a regular solid diet.  Avoid fast food or heavy meals the first week as you are more likely to get nauseated. °It is expected for your digestive tract to need a few months to get back to normal.  It is common for your bowel movements and stools to be irregular.  You will have occasional bloating and cramping that should eventually fade away.  Until you are eating solid food normally, off all pain medications, and back to regular activities; your bowels will not be normal. °Focus on eating a low-fat, high fiber diet the rest of your life  (See Getting to Good Bowel Health, below). ° °CARE of your INCISION or WOUND °It is good for closed incision and even open wounds to be washed every day.  Shower every day.  Short baths are fine.  Wash the incisions and wounds clean with soap & water.    °If you have a closed incision(s), wash the incision with soap & water every day.  You may leave closed incisions open to air if it is dry.   You may cover the incision with clean gauze & replace it after your daily shower for comfort. °If you have skin tapes (Steristrips) or skin glue (Dermabond) on your incision, leave them in place.  They will fall off on their own like a scab.  You may trim any edges that curl up with clean scissors.  If you have staples, set up an appointment for them to be removed in the office in 10 days after surgery.  °If you have a drain, wash around the skin exit site with soap & water and place a new dressing of gauze or band aid around the skin every day.  Keep the drain site clean & dry.    °If you have an open wound with packing, see wound care instructions.  In general, it is encouraged that you remove your dressing and packing, shower with soap & water, and replace your dressing once a day.  Pack the wound with clean gauze moistened with normal (0.9%) saline to keep the wound moist & uninfected.  Pressure on the dressing for 30 minutes will stop most wound   bleeding.  Eventually your body will heal & pull the open wound closed over the next few months.  °Raw open wounds will occasionally bleed or secrete yellow drainage until it heals closed.  Drain sites will drain a little until the drain is removed.  Even closed incisions can have mild bleeding or drainage the first few days until the skin edges scab over & seal.   °If you have an open wound with a wound vac, see wound vac care instructions. ° ° ° ° °ACTIVITIES as tolerated °Start light daily activities --- self-care, walking, climbing stairs-- beginning the day after surgery.   Gradually increase activities as tolerated.  Control your pain to be active.  Stop when you are tired.  Ideally, walk several times a day, eventually an hour a day.   °Most people are back to most day-to-day activities in a few weeks.  It takes 4-8 weeks to get back to unrestricted, intense activity. °If you can walk 30 minutes without difficulty, it is safe to try more intense activity such as jogging, treadmill, bicycling, low-impact aerobics, swimming, etc. °Save the most intensive and strenuous activity for last (Usually 4-8 weeks after surgery) such as sit-ups, heavy lifting, contact sports, etc.  Refrain from any intense heavy lifting or straining until you are off narcotics for pain control.  You will have off days, but things should improve week-by-week. °DO NOT PUSH THROUGH PAIN.  Let pain be your guide: If it hurts to do something, don't do it.  Pain is your body warning you to avoid that activity for another week until the pain goes down. °You may drive when you are no longer taking narcotic prescription pain medication, you can comfortably wear a seatbelt, and you can safely make sudden turns/stops to protect yourself without hesitating due to pain. °You may have sexual intercourse when it is comfortable. If it hurts to do something, stop. ° °MEDICATIONS °Take your usually prescribed home medications unless otherwise directed.   °Blood thinners:  °Usually you can restart any strong blood thinners after the second postoperative day.  It is OK to take aspirin right away.    ° If you are on strong blood thinners (warfarin/Coumadin, Plavix, Xerelto, Eliquis, Pradaxa, etc), discuss with your surgeon, medicine PCP, and/or cardiologist for instructions on when to restart the blood thinner & if blood monitoring is needed (PT/INR blood check, etc).   ° ° °PAIN CONTROL °Pain after surgery or related to activity is often due to strain/injury to muscle, tendon, nerves and/or incisions.  This pain is usually  short-term and will improve in a few months.  °To help speed the process of healing and to get back to regular activity more quickly, DO THE FOLLOWING THINGS TOGETHER: °1. Increase activity gradually.  DO NOT PUSH THROUGH PAIN °2. Use Ice and/or Heat °3. Try Gentle Massage and/or Stretching °4. Take over the counter pain medication °5. Take Narcotic prescription pain medication for more severe pain ° °Good pain control = faster recovery.  It is better to take more medicine to be more active than to stay in bed all day to avoid medications. °1.  Increase activity gradually °Avoid heavy lifting at first, then increase to lifting as tolerated over the next 6 weeks. °Do not “push through” the pain.  Listen to your body and avoid positions and maneuvers than reproduce the pain.  Wait a few days before trying something more intense °Walking an hour a day is encouraged to help your body recover faster   and more safely.  Start slowly and stop when getting sore.  If you can walk 30 minutes without stopping or pain, you can try more intense activity (running, jogging, aerobics, cycling, swimming, treadmill, sex, sports, weightlifting, etc.) °Remember: If it hurts to do it, then don’t do it! °2. Use Ice and/or Heat °You will have swelling and bruising around the incisions.  This will take several weeks to resolve. °Ice packs or heating pads (6-8 times a day, 30-60 minutes at a time) will help sooth soreness & bruising. °Some people prefer to use ice alone, heat alone, or alternate between ice & heat.  Experiment and see what works best for you.  Consider trying ice for the first few days to help decrease swelling and bruising; then, switch to heat to help relax sore spots and speed recovery. °Shower every day.  Short baths are fine.  It feels good!  Keep the incisions and wounds clean with soap & water.   °3. Try Gentle Massage and/or Stretching °Massage at the area of pain many times a day °Stop if you feel pain - do not  overdo it °4. Take over the counter pain medication °This helps the muscle and nerve tissues become less irritable and calm down faster °Choose ONE of the following over-the-counter anti-inflammatory medications: °Acetaminophen 500mg tabs (Tylenol) 1-2 pills with every meal and just before bedtime (avoid if you have liver problems or if you have acetaminophen in you narcotic prescription) °Naproxen 220mg tabs (ex. Aleve, Naprosyn) 1-2 pills twice a day (avoid if you have kidney, stomach, IBD, or bleeding problems) °Ibuprofen 200mg tabs (ex. Advil, Motrin) 3-4 pills with every meal and just before bedtime (avoid if you have kidney, stomach, IBD, or bleeding problems) °Take with food/snack several times a day as directed for at least 2 weeks to help keep pain / soreness down & more manageable. °5. Take Narcotic prescription pain medication for more severe pain °A prescription for strong pain control is often given to you upon discharge (for example: oxycodone/Percocet, hydrocodone/Norco/Vicodin, or tramadol/Ultram) °Take your pain medication as prescribed. °Be mindful that most narcotic prescriptions contain Tylenol (acetaminophen) as well - avoid taking too much Tylenol. °If you are having problems/concerns with the prescription medicine (does not control pain, nausea, vomiting, rash, itching, etc.), please call us [phone] to see if we need to switch you to a different pain medicine that will work better for you and/or control your side effects better. °If you need a refill on your pain medication, you must call the office before 4 pm and on weekdays only.  By federal law, prescriptions for narcotics cannot be called into a pharmacy.  They must be filled out on paper & picked up from our office by the patient or authorized caretaker.  Prescriptions cannot be filled after 4 pm nor on weekends.   ° °WHEN TO CALL US [phone] °Severe uncontrolled or worsening pain  °Fever over 101 F (38.5 C) °Concerns with  the incision: Worsening pain, redness, rash/hives, swelling, bleeding, or drainage °Reactions / problems with new medications (itching, rash, hives, nausea, etc.) °Nausea and/or vomiting °Difficulty urinating °Difficulty breathing °Worsening fatigue, dizziness, lightheadedness, blurred vision °Other concerns °If you are not getting better after two weeks or are noticing you are getting worse, contact our office [phone] for further advice.  We may need to adjust your medications, re-evaluate you in the office, send you to the emergency room, or see what other things we can do to help. °The   clinic staff is available to answer your questions during regular business hours (8:30am-5pm).  Please don’t hesitate to call and ask to speak to one of our nurses for clinical concerns.    °A surgeon from [facility] is always on call at the hospitals 24 hours/day °If you have a medical emergency, go to the nearest emergency room or call 911. ° °FOLLOW UP in our office °One the day of your discharge from the hospital (or the next business weekday), please call [facility] to set up or confirm an appointment to see your surgeon in the office for a follow-up appointment.  Usually it is 2-3 weeks after your surgery.   °If you have skin staples at your incision(s), let the office know so we can set up a time in the office for the nurse to remove them (usually around 10 days after surgery). °Make sure that you call for appointments the day of discharge (or the next business weekday) from the hospital to ensure a convenient appointment time. °IF YOU HAVE DISABILITY OR FAMILY LEAVE FORMS, BRING THEM TO THE OFFICE FOR PROCESSING.  DO NOT GIVE THEM TO YOUR DOCTOR. ° °[facility] °[address], [address], [place],   [address] ? °[phone] - Main °[phone] - Toll Free,  [phone] - Fax °[email or web address] ° °GETTING TO GOOD BOWEL HEALTH. °It is  expected for your digestive tract to need a few months to get back to normal.  It is common for your bowel movements and stools to be irregular.  You will have occasional bloating and cramping that should eventually fade away.  Until you are eating solid food normally, off all pain medications, and back to regular activities; your bowels will not be normal.   °Avoiding constipation °The goal: ONE SOFT BOWEL MOVEMENT A DAY!    °Drink plenty of fluids.  Choose water first. °TAKE A FIBER SUPPLEMENT EVERY DAY THE REST OF YOUR LIFE °During your first week back home, gradually add back a fiber supplement every day °Experiment which form you can tolerate.   There are many forms such as powders, tablets, wafers, gummies, etc °Psyllium bran (Metamucil), methylcellulose (Citrucel), Miralax or Glycolax, Benefiber, Flax Seed.  °Adjust the dose week-by-week (1/2 dose/day to 6 doses a day) until you are moving your bowels 1-2 times a day.  Cut back the dose or try a different fiber product if it is giving you problems such as diarrhea or bloating. °Sometimes a laxative is needed to help jump-start bowels if constipated until the fiber supplement can help regulate your bowels.  If you are tolerating eating & you are farting, it is okay to try a gentle laxative such as double dose MiraLax, prune juice, or Milk of Magnesia.  Avoid using laxatives too often. °Stool softeners can sometimes help counteract the constipating effects of narcotic pain medicines.  It can also cause diarrhea, so avoid using for too long. °If you are still constipated despite taking fiber daily, eating solids, and a few doses of laxatives, call our office. °Controlling diarrhea °Try drinking liquids and eating bland foods for a few days to avoid stressing your intestines further. °Avoid dairy products (especially milk & ice cream) for a short time.  The intestines often can lose the ability to digest lactose when stressed. °Avoid foods that cause gassiness or  bloating.  Typical foods include beans and other legumes, cabbage, broccoli, and dairy foods.  Avoid greasy, spicy, fast foods.  Every person has   some sensitivity to other foods, so listen to your body and avoid those foods that trigger problems for you. Probiotics (such as active yogurt, Align, etc) may help repopulate the intestines and colon with normal bacteria and calm down a sensitive digestive tract Adding a fiber supplement gradually can help thicken stools by absorbing excess fluid and retrain the intestines to act more normally.  Slowly increase the dose over a few weeks.  Too much fiber too soon can backfire and cause cramping & bloating. It is okay to try and slow down diarrhea with a few doses of antidiarrheal medicines.   Bismuth subsalicylate (ex. Kayopectate, Pepto Bismol) for a few doses can help control diarrhea.  Avoid if pregnant.   Loperamide (Imodium) can slow down diarrhea.  Start with one tablet (2mg ) first.  Avoid if you are having fevers or severe pain.  ILEOSTOMY PATIENTS WILL HAVE CHRONIC DIARRHEA since their colon is not in use.    Drink plenty of liquids.  You will need to drink even more glasses of water/liquid a day to avoid getting dehydrated. Record output from your ileostomy.  Expect to empty the bag every 3-4 hours at first.  Most people with a permanent ileostomy empty their bag 4-6 times at the least.   Use antidiarrheal medicine (especially Imodium) several times a day to avoid getting dehydrated.  Start with a dose at bedtime & breakfast.  Adjust up or down as needed.  Increase antidiarrheal medications as directed to avoid emptying the bag more than 8 times a day (every 3 hours). Work with your wound ostomy nurse to learn care for your ostomy.  See ostomy care instructions. TROUBLESHOOTING IRREGULAR BOWELS 1) Start with a soft & bland diet. No spicy, greasy, or fried foods.  2) Avoid gluten/wheat or dairy products from diet to see if symptoms improve. 3) Miralax  17gm or flax seed mixed in [place]. water or juice-daily. May use 2-4 times a day as needed. 4) Gas-X, Phazyme, etc. as needed for gas & bloating.  5) Prilosec (omeprazole) over-the-counter as needed 6)  Consider probiotics (Align, Activa, etc) to help calm the bowels down  Call your doctor if you are getting worse or not getting better.  Sometimes further testing (cultures, endoscopy, X-ray studies, CT scans, bloodwork, etc.) may be needed to help diagnose and treat the cause of the diarrhea. [facility], [place], [place], [place], [place]  [address] [phone] - Main.    [phone]  - Toll Free.   [phone] - Fax [email or web address]    Diverticulitis  Diverticulitis is infection or inflammation of small pouches (diverticula) in the colon that form due to a condition called diverticulosis. Diverticula can trap stool (feces) and bacteria, causing infection and inflammation. Diverticulitis may cause severe stomach pain and diarrhea. It may lead to tissue damage in the colon that causes bleeding. The diverticula may also burst (rupture) and cause infected stool to enter other areas of the abdomen. Complications of diverticulitis can include:  Bleeding.  Severe infection.  Severe pain.  Rupture (perforation) of the colon.  Blockage (obstruction) of the colon. What are the causes? This condition is caused by stool becoming trapped in the diverticula, which allows bacteria to grow in the diverticula. This leads to inflammation and infection. What increases the risk? You are more likely to develop this condition if:  You have diverticulosis. The risk for diverticulosis increases if: ? You are overweight or obese. ? You use tobacco products. ? You  do not get enough exercise.  You eat a diet that does not include enough fiber. High-fiber foods include fruits, vegetables, beans, nuts, and whole grains. What are the signs or  symptoms? Symptoms of this condition may include:  Pain and tenderness in the abdomen. The pain is normally located on the left side of the abdomen, but it may occur in other areas.  Fever and chills.  Bloating.  Cramping.  Nausea.  Vomiting.  Changes in bowel routines.  Blood in your stool. How is this diagnosed? This condition is diagnosed based on:  Your medical history.  A physical exam.  Tests to make sure there is nothing else causing your condition. These tests may include: ? Blood tests. ? Urine tests. ? Imaging tests of the abdomen, including X-rays, ultrasounds, MRIs, or CT scans. How is this treated? Most cases of this condition are mild and can be treated at home. Treatment may include:  Taking over-the-counter pain medicines.  Following a clear liquid diet.  Taking antibiotic medicines by mouth.  Rest. More severe cases may need to be treated at a hospital. Treatment may include:  Not eating or drinking.  Taking prescription pain medicine.  Receiving antibiotic medicines through an IV tube.  Receiving fluids and nutrition through an IV tube.  Surgery. When your condition is under control, your health care provider may recommend that you have a colonoscopy. This is an exam to look at the entire large intestine. During the exam, a lubricated, bendable tube is inserted into the anus and then passed into the rectum, colon, and other parts of the large intestine. A colonoscopy can show how severe your diverticula are and whether something else may be causing your symptoms. Follow these instructions at home: Medicines  Take over-the-counter and prescription medicines only as told by your health care provider. These include fiber supplements, probiotics, and stool softeners.  If you were prescribed an antibiotic medicine, take it as told by your health care provider. Do not stop taking the antibiotic even if you start to feel better.  Do not drive or  use heavy machinery while taking prescription pain medicine. General instructions   Follow a full liquid diet or another diet as directed by your health care provider. After your symptoms improve, your health care provider may tell you to change your diet. He or she may recommend that you eat a diet that contains at least 25 g (25 grams) of fiber daily. Fiber makes it easier to pass stool. Healthy sources of fiber include: ? Berries. One cup contains 4-8 grams of fiber. ? Beans or lentils. One half cup contains 5-8 grams of fiber. ? Green vegetables. One cup contains 4 grams of fiber.  Exercise for at least 30 minutes, 3 times each week. You should exercise hard enough to raise your heart rate and break a sweat.  Keep all follow-up visits as told by your health care provider. This is important. You may need a colonoscopy. Contact a health care provider if:  Your pain does not improve.  You have a hard time drinking or eating food.  Your bowel movements do not return to normal. Get help right away if:  Your pain gets worse.  Your symptoms do not get better with treatment.  Your symptoms suddenly get worse.  You have a fever.  You vomit more than one time.  You have stools that are bloody, black, or tarry. Summary  Diverticulitis is infection or inflammation of small pouches (diverticula) in the  colon that form due to a condition called diverticulosis. Diverticula can trap stool (feces) and bacteria, causing infection and inflammation.  You are at higher risk for this condition if you have diverticulosis and you eat a diet that does not include enough fiber.  Most cases of this condition are mild and can be treated at home. More severe cases may need to be treated at a hospital.  When your condition is under control, your health care provider may recommend that you have an exam called a colonoscopy. This exam can show how severe your diverticula are and whether something else  may be causing your symptoms. This information is not intended to replace advice given to you by your health care provider. Make sure you discuss any questions you have with your health care provider. Document Released: 07/25/2005 Document Revised: 11/17/2016 Document Reviewed: 11/17/2016 Elsevier Interactive Patient Education  2019 [employer].

## 2018-12-26 NOTE — Anesthesia Postprocedure Evaluation (Signed)
Anesthesia Post Note  Patient: [name]  Procedure(s) Performed: XI ROBOTIC LEFT COLECTOMY, MOBILIZATION OF SPLENIC FLEXURE OF COLON, PARTIAL OMENTECTOMY (N/A ) RIGID PROCTOSCOPY (N/A )     Patient location during evaluation: PACU Anesthesia Type: General Level of consciousness: awake and alert Pain management: pain level controlled Vital Signs Assessment: post-procedure vital signs reviewed and stable Respiratory status: spontaneous breathing, nonlabored ventilation, respiratory function stable and patient connected to nasal cannula oxygen Cardiovascular status: blood pressure returned to baseline and stable Postop Assessment: no apparent nausea or vomiting Anesthetic complications: no    Last Vitals:  Vitals:   12/26/18 1117 12/26/18 1718  BP: 113/63 105/75  Pulse: 69 84  Resp: 18 13  Temp: 36.6 C (!) 36.4 C  SpO2: 96% 97%    Last Pain:  Vitals:   12/26/18 1718  TempSrc:   PainSc: 0-No pain                 [name]

## 2018-12-26 NOTE — Transfer of Care (Signed)
Immediate Anesthesia Transfer of Care Note  Patient: [name]  Procedure(s) Performed: XI ROBOTIC LEFT COLECTOMY, MOBILIZATION OF SPLENIC FLEXURE OF COLON, PARTIAL OMENTECTOMY (N/A ) RIGID PROCTOSCOPY (N/A )  Patient Location: PACU  Anesthesia Type:General  Level of Consciousness: awake and patient cooperative  Airway & Oxygen Therapy: Patient Spontanous Breathing and Patient connected to face mask  Post-op Assessment: Report given to RN and Post -op Vital signs reviewed and stable  Post vital signs: Reviewed and stable  Last Vitals:  Vitals Value Taken Time  BP 105/75 12/26/2018  5:18 PM  Temp    Pulse 86 12/26/2018  5:19 PM  Resp 12 12/26/2018  5:19 PM  SpO2 98 % 12/26/2018  5:19 PM  Vitals shown include unvalidated device data.  Last Pain:  Vitals:   12/26/18 1141  TempSrc:   PainSc: 0-No pain      Patients Stated Pain Goal: 4 (35/67/01 4103)  Complications: No apparent anesthesia complications

## 2018-12-26 NOTE — Interval H&P Note (Signed)
History and Physical Interval Note:  12/26/2018 11:24 AM  [name]  has presented today for surgery, with the diagnosis of diverticulitis  The various methods of treatment have been discussed with the patient and family. After consideration of risks, benefits and other options for treatment, the patient has consented to  Procedure(s): XI ROBOT RESECTION OF SIGMOID COLON (N/A) RIGID PROCTOSCOPY (N/A) as a surgical intervention .  The patient's history has been reviewed, patient examined, no change in status, stable for surgery.  I have reviewed the patient's chart and labs.  Questions were answered to the patient's satisfaction.    Screening colonoscopy to be done by his gastroenterologist, Dr. [name].  Numerous polyps noted.  He held off on polypectomy.  He recommended proceeding with surgery since many polyps were then the sigmoid colon.  Then follow-up colonoscopy in 3-6 months to clear the rest of the polyps.  Neurology clearance done.  Anesthesia aware of diagnosis of myasthenia gravis.  Steroids minimized  I have re-reviewed the the patient's records, history, medications, and allergies.  I have re-examined the patient.  I again discussed intraoperative plans and goals of post-operative recovery.  The patient agrees to proceed.  [name]  05-24-1941 [id number]  Patient Care Team: Hulan Fess, MD as PCP - General (Family Medicine) [name], MD as Consulting Physician (General Surgery) Alda Berthold, DO as Consulting Physician (Neurology) Wilford Corner, MD as Consulting Physician (Gastroenterology)  Patient Active Problem List   Diagnosis Date Noted  . Bowel perforation ([place]) 08/16/2018  . Hyperlipidemia 08/16/2018  . Hypokalemia 08/16/2018  . Protein-calorie malnutrition, severe 07/18/2018  . Myasthenia ([place]) 07/17/2018  . Dysphasia 07/17/2018  . Facial droop 06/14/2018  . BPH (benign prostatic hyperplasia) 06/14/2018  . HTN (hypertension) 06/14/2018  .  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 Unilateral primary osteoarthritis, left knee 03/17/2018  . Mass of stomach 11/02/2014  . 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 Solitary pulmonary nodule 10/26/2014  . Appendicitis 09/05/2014  . Palpitations 02/15/2011  . SUPRAVENTRICULAR TACHYCARDIA 01/11/2011    Past Medical History:  Diagnosis Date  . Colon polyp   . Complication of anesthesia    Difficult to arouse, Combative x 1  . Diverticulitis   . Diverticulosis    extensive 3'15.  . Dysrhythmia    hx. Ablation for tachycardia, no routine cardiology visits now  . ED (erectile dysfunction)   . Heart murmur    teenager  . History of colon polyps   . History of kidney stones    x1 passed  . Hyperlipidemia   . Myasthenia gravis ([place])   . Neuromuscular disorder ([place])    "neuroma"   . Osteoarthritis   . Palpitation    with svt rate 150  . Tendinitis    achiles heel spur  . Testicular atrophy   . Tubular adenoma of colon     Past Surgical History:  Procedure Laterality Date  . ABLATION  2010   cardiac   . APPENDECTOMY    . COLONOSCOPY    . EUS N/A 11/17/2014   Procedure: ESOPHAGEAL ENDOSCOPIC ULTRASOUND (EUS) RADIAL;  Surgeon: [name], MD;  Location: [facility];  Service: Endoscopy;  Laterality: N/A;  . FINE NEEDLE ASPIRATION N/A 11/17/2014   Procedure: FINE NEEDLE ASPIRATION (FNA) LINEAR;  Surgeon: [name], MD;  Location: [facility];  Service: Endoscopy;  Laterality: N/A;  + or- fna  . FLEXIBLE SIGMOIDOSCOPY    . HEMORRHOID SURGERY     banding   . HERNIA REPAIR Bilateral    '  05 bilateral hernia  . KNEE ARTHROSCOPY Left    scope '08  . KNEE ARTHROSCOPY    . LAPAROSCOPIC APPENDECTOMY N/A 09/05/2014   Procedure: APPENDECTOMY LAPAROSCOPIC;  Surgeon: [name], MD;  Location: [facility];  Service: General;  Laterality: N/A;  . ROTATOR CUFF REPAIR Right 2016    Social History   Socioeconomic History  . Marital status: Married    Spouse name: Not on file  .  Number of children: 3  . Years of education: 53  . Highest education level: Some college, no degree  Occupational History  . Occupation: sales-Retired, works one day a week  Social Needs  . Financial resource strain: Not on file  . Food insecurity:    Worry: Not on file    Inability: Not on file  . Transportation needs:    Medical: Not on file    Non-medical: Not on file  Tobacco Use  . Smoking status: Former Smoker    Packs/day: 1.00    Years: 47.00    Pack years: 47.00    Types: Cigarettes    Last attempt to quit: 10/29/2000    Years since quitting: 18.1  . Smokeless tobacco: Never Used  Substance and Sexual Activity  . Alcohol use: Yes    Alcohol/week: 1.0 standard drinks    Types: 1 Standard drinks or equivalent per week    Comment: socially- nightly 1 cocktail  . Drug use: No  . Sexual activity: Not on file  Lifestyle  . Physical activity:    Days per week: Not on file    Minutes per session: Not on file  . Stress: Not on file  Relationships  . Social connections:    Talks on phone: Not on file    Gets together: Not on file    Attends religious service: Not on file    Active member of club or organization: Not on file    Attends meetings of clubs or organizations: Not on file    Relationship status: Not on file  . Intimate partner violence:    Fear of current or ex partner: Not on file    Emotionally abused: Not on file    Physically abused: Not on file    Forced sexual activity: Not on file  Other Topics Concern  . Not on file  Social History Narrative   Lives with wife in a one story home with a basement.  Has 3 daughters.  Works one day a week at the [employer].  Retired from [employer].  Education: some college.    Caffeine use: decaf   Right handed    Family History  Problem Relation Age of Onset  . Pancreatic cancer Father   . Stroke Mother   . Aneurysm Brother   . Neuropathy Neg Hx     Medications Prior to Admission  Medication Sig Dispense  Refill Last Dose  . aspirin 81 MG tablet Take 1 tablet (81 mg total) by mouth daily.   Taking  . atorvastatin (LIPITOR) 10 MG tablet Take 1 tablet (10 mg total) by mouth daily at 6 PM. 30 tablet 5 Taking  . Calcium Carbonate-Vitamin D (CALCIUM 600+D) 600-400 MG-UNIT per tablet Take 1 tablet by mouth daily.     Taking  . Cyanocobalamin (VITAMIN B-12 PO) Take 5,000 mcg by mouth daily.    Taking  . doxazosin (CARDURA) 8 MG tablet Take 8 mg by mouth daily.   Taking  . feeding supplement, ENSURE ENLIVE, (ENSURE ENLIVE) LIQD  Take 237 mLs by mouth 3 (three) times daily between meals. (Patient taking differently: Take 237 mLs by mouth daily. ) 60 Bottle 60 Taking  . finasteride (PROSCAR) 5 MG tablet Take 5 mg by mouth every morning.   2 Taking  . Glucosamine HCl 1500 MG TABS Take 1,500 mg by mouth daily.   Taking  . ibuprofen (ADVIL,MOTRIN) 200 MG tablet Take 200 mg by mouth every 6 (six) hours as needed for headache or moderate pain.    Taking  . Multiple Vitamin (MULTIVITAMIN WITH MINERALS) TABS tablet Take 1 tablet by mouth daily.   Taking  . predniSONE (DELTASONE) 10 MG tablet Reduce prednisone to alternating days of 15mg  and 10mg  for two weeks, then reduce to 10mg  daily.  Stay on predinsone 10mg  daily. (Patient taking differently: Take 10 mg by mouth daily with breakfast. ) 90 tablet 1 Taking  . pyridostigmine (MESTINON) 60 MG tablet Take 0.5 tablets (30 mg total) by mouth 3 (three) times daily. Take half tablet at 8am, 1pm, and 6pm (Patient taking differently: Take 30 mg by mouth 3 (three) times daily. Take 30 mg tablet at 8am, 1pm, and 6pm) 135 tablet 3 Taking    Current Facility-Administered Medications  Medication Dose Route Frequency Provider Last Rate Last Dose  . acetaminophen (TYLENOL) tablet 1,000 mg  1,000 mg Oral On Call to OR [name], MD      . alvimopan (ENTEREG) capsule 12 mg  12 mg Oral On Call to OR [name], MD      . bisacodyl (DULCOLAX) EC tablet 20 mg  20 mg Oral Once  [name], MD      . bupivacaine liposome (EXPAREL) 1.3 % injection 266 mg  20 mL Infiltration On Call to OR [name], MD      . cefoTEtan (CEFOTAN) 2 g in sodium chloride 0.9 % 100 mL IVPB  2 g Intravenous On Call to OR [name], MD      . clindamycin (CLEOCIN) 900 mg, gentamicin (GARAMYCIN) 240 mg in sodium chloride 0.9 % 1,000 mL for intraperitoneal lavage   Intraperitoneal To OR [name], MD      . enoxaparin (LOVENOX) injection 40 mg  40 mg Subcutaneous Once [name], MD      . gabapentin (NEURONTIN) capsule 300 mg  300 mg Oral On Call to OR [name], MD      . lactated ringers infusion   Intravenous Continuous Lillia Abed, MD      . neomycin [facility]) tablet 1,000 mg  1,000 mg Oral 3 times per day [name], MD       And  . metroNIDAZOLE (FLAGYL) tablet 1,000 mg  1,000 mg Oral 3 times per day [name], MD      . polyethylene glycol powder (GLYCOLAX/MIRALAX) container 255 g  1 Container Oral Once [name], MD         Allergies  Allergen Reactions  . Pollen Extract Other (See Comments)    Nasal congestion    BP 113/63   Pulse 69   Temp 97.8 F (36.6 C) (Oral)   Resp 18   SpO2 96%   Labs: Results for orders placed or performed during the hospital encounter of 12/24/18 (from the past 48 hour(s))  Hemoglobin A1c     Status: Abnormal   Collection Time: 12/24/18  1:10 PM  Result Value Ref Range   Hgb A1c MFr Bld 5.7 (H) 4.8 - 5.6 %    Comment: (NOTE) Pre diabetes:  5.7%-6.4% Diabetes:              >6.4% Glycemic control for   <7.0% adults with diabetes    Mean Plasma Glucose 116.89 mg/dL    Comment: Performed at [facility] [address] [place], [place] [address]  Basic metabolic panel     Status: Abnormal   Collection Time: 12/24/18  1:10 PM  Result Value Ref Range   Sodium 142 135 - 145 mmol/L   Potassium 4.2 3.5 - 5.1 mmol/L   Chloride 107 98 - 111 mmol/L   CO2 27 22 - 32 mmol/L   Glucose, Bld 117 (H)  70 - 99 mg/dL   BUN 17 8 - 23 mg/dL   Creatinine, Ser 0.70 0.61 - 1.24 mg/dL   Calcium 9.6 8.9 - 10.3 mg/dL   GFR calc non Af Amer >60 >60 mL/min   GFR calc Af Amer >60 >60 mL/min   Anion gap 8 5 - 15    Comment: Performed at [facility], [place] [address] [place], [place] [address]  CBC     Status: Abnormal   Collection Time: 12/24/18  1:10 PM  Result Value Ref Range   WBC 11.7 (H) 4.0 - 10.5 K/uL   RBC 5.12 4.22 - 5.81 MIL/uL   Hemoglobin 14.7 13.0 - 17.0 g/dL   HCT 47.7 39.0 - 52.0 %   MCV 93.2 80.0 - 100.0 fL   MCH 28.7 26.0 - 34.0 pg   MCHC 30.8 30.0 - 36.0 g/dL   RDW 13.2 11.5 - 15.5 %   Platelets 300 150 - 400 K/uL   nRBC 0.0 0.0 - 0.2 %    Comment: Performed at [facility], [place] [address], [place], [place] [address]    Imaging / Studies: No results found.   Adin Hector, M.D., F.A.C.S. Gastrointestinal and Minimally Invasive Surgery [facility] [address] [place], [place] [id number] [phone] Main / Paging  12/26/2018 11:25 AM     Adin Hector

## 2018-12-26 NOTE — Progress Notes (Signed)
PHARMACIST - PHYSICIAN ORDER COMMUNICATION  CONCERNING: P&T Medication Policy on Herbal Medications  DESCRIPTION:  This patient's order for: Glucosamine has been noted.  This product(s) is classified as an "herbal" or natural product. Due to a lack of definitive safety studies or FDA approval, nonstandard manufacturing practices, plus the potential risk of unknown drug-drug interactions while on inpatient medications, the Pharmacy and Therapeutics Committee does not permit the use of "herbal" or natural products of this type within [facility].   ACTION TAKEN: The pharmacy department is unable to verify this order at this time and your patient has been informed of this safety policy. Please reevaluate patient's clinical condition at discharge and address if the herbal or natural product(s) should be resumed at that time.   Lindell Spar, PharmD, BCPS Pager: [phone] 12/26/2018 7:09 PM

## 2018-12-26 NOTE — Op Note (Signed)
12/26/2018  5:02 PM  PATIENT:  [name]  78 y.o. male  Patient Care Team: Hulan Fess, MD as PCP - General (Family Medicine) [name], MD as Consulting Physician (General Surgery) Alda Berthold, DO as Consulting Physician (Neurology) Wilford Corner, MD as Consulting Physician (Gastroenterology)  PRE-OPERATIVE DIAGNOSIS:  Descending colon diverticulitis  POST-OPERATIVE DIAGNOSIS:  Left sided colon diverticulitis  PROCEDURE:  XI ROBOTIC LOW ANTERIOR RESECTION (descending colon to mid-rectum) MOBILIZATION OF SPLENIC FLEXURE OF COLON Assessment of tissue perfusion using Firefly immunofluorescence PARTIAL OMENTECTOMY RIGID PROCTOSCOPY  SURGEON:  Adin Hector, MD  ASSISTANT: Nadeen Landau, MD An experienced assistant was required given the standard of surgical care given the complexity of the case.  This assistant was needed for exposure, dissection, suctioning, retraction, instrument exchange, etc.  ANESTHESIA:   local and general  EBL:  Total I/O In: 2000 [I.V.:2000] Out: 1050 [Urine:350; Blood:700]  Delay start of Pharmacological VTE agent (>24hrs) due to surgical blood loss or risk of bleeding:  no  DRAINS: None  SPECIMEN:  Left-sided colon from descending, sigmoid, proximal rectum. Proximal anastomotic ring.  (blue prolene stitch within it).   Distal anastomotic ring on rectum.  DISPOSITION OF SPECIMEN:  PATHOLOGY  COUNTS:  YES  PLAN OF CARE: Admit to inpatient   PATIENT DISPOSITION:  PACU - hemodynamically stable.  INDICATION:    Is less than gentleman with a history of diverticulitis.  Had episode of perforation required admission.  Improved with bowel rest and IV antibiotics.  Myasthenia gravis.  Chronically suppressed.  Steroid underwent his medical surgery.  Prednisone able to visualize down to help.  Preoperative colonoscopy revealed colon polyps.  Felt by his gastroenterologist to have follow-up colonoscopy in 3-6 months to clear the  rest of the remaining polyps since a fair amount already on the left colon.  I recommended segmental resection:  The anatomy & physiology of the digestive tract was discussed.  The pathophysiology was discussed.  Natural history risks without surgery was discussed.   I worked to give an overview of the disease and the frequent need to have multispecialty involvement.  I feel the risks of no intervention will lead to serious problems that outweigh the operative risks; therefore, I recommended a partial colectomy to remove the pathology.  Laparoscopic & open techniques were discussed.    Risks such as bleeding, infection, abscess, leak, reoperation, possible ostomy, hernia, heart attack, death, and other risks were discussed.  I noted a good likelihood this will help address the problem.   Goals of post-operative recovery were discussed as well.  We will work to minimize complications.  Educational materials on the pathology had been given in the office.  Questions were answered.    The patient expressed understanding & wished to proceed with surgery.  OR FINDINGS:   Patient had chronically inflamed left-sided colon especially in the mid descending colon and sigmoid colon.  Very dense adhesions of thickened omentum along the left colon and splenic flexure.  No obvious metastatic disease on visceral parietal peritoneum or liver.  The anastomosis rests 10 cm from the anal verge by rigid proctoscopy.  It is an end-splenic-flexure-colon to anterior-mid-rectal-wall stapled 31 Ethicon EEAanastomosis  DESCRIPTION:   Informed consent was confirmed.  The patient underwent general anaesthesia without difficulty.  The patient was positioned appropriately.  VTE prevention in place.  The patient was clipped, prepped, & draped in a sterile fashion.  Surgical timeout confirmed our plan.  The patient was positioned in reverse Trendelenburg.  Abdominal  entry was gained using Varess technique at the left subcostal  ridge on the anterior abdominal wall.  No elevated EtCO2 noted.  Port placed.  Camera inspection revealed no injury.  Extra ports were carefully placed under direct laparoscopic visualization.  I reflected small bowel in the upper abdomen.  Had some rather dense adhesions by omentum to the left colon & sigmoid.  The patient was carefully positioned.  The Intuitive daVinci robot was docked with camera & instruments carefully placed.  The patient had obviously inflamed colon around the descending colon and sigmoid colon.  Correlated with his progression free air primarily focused in the descending colon.  We carefully freed greater omentum off its attachments to the sigmoid colon.  Was rather dense to the descending colon.  Eventually freed most of that up more towards the splenic flexure.  Mobilized the colon in a lateral to medial fashion to help straighten out the somewhat twisted inflamed sigmoid colon and descending colon.   This allowed [place] to straighten out the left colon mesentery.  Elevated the sigmoid colon to place the inferior mesenteric vessels on tension.  This allowed me to identify the inferior mesenteric pedicle.  I scored the base of peritoneum of the medial side of the mesentery of the elevated left colon from the ligament of Treitz to the peritoneal reflection of the mid rectum.   I elevated the sigmoid mesentery and entered into the retro-mesenteric plane. We were able to identify the left ureter and gonadal vessels. We kept those posterior within the retroperitoneum and elevated the left colon mesentery off that. I did isolate the inferior mesenteric artery (IMA) pedicle but did not ligate it yet.  I continued distally and got into the avascular plane posterior to the mesorectum. This allowed me to help mobilize the rectum as well by freeing the mesorectum off the sacrum.  I mobilized the peritoneal coverings towards the peritoneal reflection on both the right and left sides of the rectum.  I  stayed away from the right and left ureters.  I kept the lateral vascular pedicles to the rectum intact.  I did do more dissection to free the greater omentum off the left colon that was chronically inflamed and thickened.  Ended up having to resect some greater omentum to do this.  I encountered no abscess or pus pocket.  I skeletonized the lymph nodes off the inferior mesenteric artery pedicle.  I went down to its takeoff from the aorta.  I isolated the inferior mesenteric vein off of the ligament of Treitz just cephalad to that as well.  After confirming the left ureter was out of the way, I went ahead and ligated the inferior mesenteric artery pedicle just near its takeoff from the aorta.  I did ligate the inferior mesenteric vein in a similar fashion since the descending colon was the most inflamed area in the IMV short in the left colon mesentery markedly..  We ensured hemostasis. I skeletonized the mesorectum at the junction at the proximal rectum for the distal point of resection.  I transected through the mesorectum at the proximal/mid junction distal to where there was some chronic inflammation of the proximal rectum and due to the inflamed sigmoid colon.  This area was much more soft.  I mobilized the left colon in a lateral to medial fashion off the line of Toldt up towards the splenic flexure.  Despite that, it was quite apparent that the proximal descending colon near the splenic flexure that was noninflamed would  not reach down.  Therefore decided to do a formal splenic flexure mobilization to ensure good mobilization of the remaining colon to reach into the pelvis.  I elevated the mid transverse colon and identified the left middle colic pedicle.  I split the mesentery radially to get into the lesser sac.  Came over the body of the pancreas to do that.  This help me identify the distal transverse colon and splenic flexure mesentery.  I mobilized in a lateral medial fashion and transected this  mesentery distally towards the splenic flexure until I connected with my prior dissection of the proximal left colon mesentery.  Eventually elevated off so that the entire left colon was freed off the retroperitoneum from the left middle colic pedicle down to the mesorectum.  I then mobilized the greater omentum off the distal transverse colon and splenic flexure.  Untwisted and unfolded it.  Freddrick March off some retroperitoneal attachments of the inferior pancreatic border as well as retroperitoneal attachments near the splenic hilum.  With that I had full mobilization of the left colon down into the pelvis.   I chose an area close to the splenic flexure that was soft and noninflamed.  I transected the mesentery radially.  I did firefly immunofluorescence administered intravenously by anesthesia and confirmed good perfusion of the transverse colon and splenic flexure to the premarked area of proximal transection.  Rectal perfusion viable as well.   I transected at the proximal-mid rectum junction using a robotic 45 mm stapler.  I transected the mesentery of the colon radially to preserve remaining colon blood supply.  Came 90% across with the first firing.  Did the left lateral corner with one more firing.  I created an extraction incision through a small Pfannenstiel incision in the suprapubic region.  Placed a wound protector.  I was able to eviscerate the excised greater omentum.  Was able to eviscerate the left colon out the wound.   Sized some adherent greater omentum off the left colon that had become ischemic until had much more healthy mesocolon.  I clamped the colon proximal to this area using a reusable pursestringer device.  Passed a 2-0 Keith needle. I transected at the descending/sigmoid junction with a scalpel. I got healthy bleeding mucosa.  We sent the rectosigmoid colon specimen off to go to pathology.  We sized the colon orifice.  I chose a 31 EEA anvil stapler system.  I reinforced the prolene  pursestring with interrupted silk suture.  I placed the anvil to the open end of the proximal remaining colon and closed around it using the pursestring.    We did copious irrigation with crystalloid solution.  Hemostasis was good.  The distal end of the remaining colon easily reached down to the rectal stump, therefore, further colonic mobilization was not needed.      Dr Dema Severin scrubbed down and did gentle anal dilation and advanced the EEA stapler up the rectal stump.  The stapler could not come up to the upper end, so we redirected did come out the anterior rectal wall 6 cm distal to the staple line under direct visualization.  I attached the anvil of the proximal colon the spike of the stapler. Anvil was tightened down and held clamped for 60 seconds. The EEA stapler was fired and held clamped for 30 seconds. The stapler was released & removed. We noted 2 excellent anastomotic rings. Blue stitch is in the proximal ring.  Dr Dema Severin did rigid proctoscopy noted the anastomosis was at  10 cm from the anal verge consistent with the proximal rectum.  We did a final irrigation of antibiotic solution (900 mg clindamycin/240 mg gentamicin in a liter of crystalloid) & held that for the pelvic air leak test .  The rectum was insufflated the rectum while clamping the colon proximal to that anastomosis.  There was a negative air leak test. There was no tension of mesentery or bowel at the anastomosis.   Evacuated old clot but so no active bleeding or other concerns.  Tissues looked viable.  Ureters & bowel uninjured.  The anastomosis looked healthy.  Endoluminal gas was evacuated.  Ports & wound protector removed.  We changed gloves & redraped the patient per colon SSI prevention protocol.  We aspirated the antibiotic irrigation.  Hemostasis was good.  Sterile unused instruments were used from this point.  I closed the skin at the port sites using Monocryl stitch and sterile dressing.  I closed the extraction wound using a  0 Vicryl vertical peritoneal closure and a #1 PDS transverse anterior rectal fascial closure like a small Pfannenstiel closure. I closed the skin with some interrupted Monocryl stitches. I placed antibiotic-soaked wicks into the closure at the corners x2.  I placed sterile dressings.     Patient is extubated go to recovery room.  He is breathing on his own and looks comfortable.  No obvious weakness.  I think he is stable to go to the floor.  Anesthesia agrees.  I had discussed postop care with the patient in detail the office & in the holding area. Instructions are written. I discussed operative findings, updated the patient's status, discussed probable steps to recovery, and gave postoperative recommendations to the patient's family.  Recommendations were made.  Questions were answered.  They expressed understanding & appreciation.  One daughter gave me a hug.     Adin Hector, M.D., F.A.C.S. Gastrointestinal and Minimally Invasive Surgery [facility] [address] [place], [place] [address] [phone] Main / Paging

## 2018-12-27 ENCOUNTER — Encounter (HOSPITAL_COMMUNITY): Payer: Self-pay

## 2018-12-27 LAB — BASIC METABOLIC PANEL
Anion gap: 6 (ref 5–15)
BUN: 9 mg/dL (ref 8–23)
CO2: 27 mmol/L (ref 22–32)
Calcium: 7.9 mg/dL — ABNORMAL LOW (ref 8.9–10.3)
Chloride: 106 mmol/L (ref 98–111)
Creatinine, Ser: 0.71 mg/dL (ref 0.61–1.24)
GFR calc Af Amer: >60 mL/min (ref >60)
GFR calc non Af Amer: >60 mL/min (ref >60)
Glucose, Bld: 116 mg/dL — ABNORMAL HIGH (ref 70–99)
Potassium: 3.7 mmol/L (ref 3.5–5.1)
Sodium: 139 mmol/L (ref 135–145)

## 2018-12-27 LAB — CBC
HCT: 35.4 % — ABNORMAL LOW (ref 39.0–52.0)
Hemoglobin: 11 g/dL — ABNORMAL LOW (ref 13.0–17.0)
MCH: 29.6 pg (ref 26.0–34.0)
MCHC: 31.1 g/dL (ref 30.0–36.0)
MCV: 95.2 fL (ref 80.0–100.0)
Platelets: 202 10*3/uL (ref 150–400)
RBC: 3.72 MIL/uL — ABNORMAL LOW (ref 4.22–5.81)
RDW: 13.6 % (ref 11.5–15.5)
WBC: 8.1 10*3/uL (ref 4.0–10.5)
nRBC: 0 % (ref 0.0–0.2)

## 2018-12-27 LAB — MAGNESIUM: Magnesium: 1.9 mg/dL (ref 1.7–2.4)

## 2018-12-27 NOTE — Evaluation (Signed)
Physical Therapy Evaluation Patient Details Name: [name] MRN: [id number] DOB: [birth year] Today's Date: 12/27/2018   History of Present Illness  [name]  has presented today for surgery, with the diagnosis of diverticulitis. Pt s/p colectomy  Clinical Impression  Pt presents with with the above diagnosis. Pt is Independent with mobility without an assistive device. Pt does have abdominal pain due to surgery. Educated pt and family on bed mobility log roll technique to minimize the strain on the abdominal muscles. Reinforced frequent walks on the unit and at home to maintain activity level. Pt does not have any further PT needs and is safe to d/c home with faimly and no follow-up therapy indicated. Pt is d/c from PT services.    Follow Up Recommendations No PT follow up    Equipment Recommendations  None recommended by PT    Recommendations for Other Services       Precautions / Restrictions Precautions Precautions: Other (comment) Precaution Comments: abdominal Restrictions Weight Bearing Restrictions: No      Mobility  Bed Mobility Overal bed mobility: Independent             General bed mobility comments: reviewed log roll technique to minimize abdominal strain  Transfers Overall transfer level: Independent Equipment used: None                Ambulation/Gait Ambulation/Gait assistance: Independent Gait Distance (Feet): 200 Feet Assistive device: None Gait Pattern/deviations: Step-through pattern Gait velocity: good      Stairs            Wheelchair Mobility    Modified Rankin (Stroke Patients Only)       Balance Overall balance assessment: No apparent balance deficits (not formally assessed)                                           Pertinent Vitals/Pain Pain Assessment: 0-10 Pain Score: 8  Pain Location: abdomin Pain Descriptors / Indicators: Discomfort Pain Intervention(s): Limited activity within  patient's tolerance;Monitored during session;Repositioned    Home Living Family/patient expects to be discharged to:: Private residence Living Arrangements: Spouse/significant other Available Help at Discharge: Family Type of Home: House Home Access: Stairs to enter Entrance Stairs-Rails: Right Entrance Stairs-Number of Steps: 2 Home Layout: Two level;Able to live on main level with bedroom/bathroom Home Equipment: Crutches;Cane - single point      Prior Function Level of Independence: Independent         Comments: active, does yard workl     Hand Dominance        Extremity/Trunk Assessment        Lower Extremity Assessment Lower Extremity Assessment: Overall WFL for tasks assessed    Cervical / Trunk Assessment Cervical / Trunk Assessment: Normal  Communication   Communication: No difficulties  Cognition Arousal/Alertness: Awake/alert   Overall Cognitive Status: Within Functional Limits for tasks assessed                                        General Comments      Exercises     Assessment/Plan    PT Assessment Patent does not need any further PT services  PT Problem List         PT Treatment Interventions      PT Goals (Current  goals can be found in the Care Plan section)       Frequency     Barriers to discharge        Co-evaluation               AM-PAC PT "6 Clicks" Mobility  Outcome Measure Help needed turning from your back to your side while in a flat bed without using bedrails?: None Help needed moving from lying on your back to sitting on the side of a flat bed without using bedrails?: None Help needed moving to and from a bed to a chair (including a wheelchair)?: None Help needed standing up from a chair using your arms (e.g., wheelchair or bedside chair)?: None Help needed to walk in hospital room?: None Help needed climbing 3-5 steps with a railing? : None 6 Click Score: 24    End of Session    Activity Tolerance: Patient tolerated treatment well Patient left: in chair;with family/visitor present Nurse Communication: Mobility status(Pt is Independent) PT Visit Diagnosis: Unsteadiness on feet (R26.81)    Time: 8309-4076 PT Time Calculation (min) (ACUTE ONLY): 24 min   Charges:   PT Evaluation $PT Eval Low Complexity: 1 Low PT Treatments $Self Care/Home Management: 8-22        Theodoro Grist, PT  Lelon Mast 12/27/2018, 3:03 PM

## 2018-12-27 NOTE — Progress Notes (Signed)
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 Active Problems:   Myasthenia ([place])   Idiopathic polyneuropathy   BPH (benign prostatic hyperplasia)   HTN (hypertension)   Hyperlipidemia   History of diverticulitis of colon   History of colonic polyps   Diverticulitis of sigmoid colon   1 Day Post-Op  12/26/2018  POST-OPERATIVE DIAGNOSIS:  Left sided colon diverticulitis  PROCEDURE:  XI ROBOTIC LOW ANTERIOR RESECTION (descending colon to mid-rectum) MOBILIZATION OF SPLENIC FLEXURE OF COLON Assessment of tissue perfusion using Firefly immunofluorescence PARTIAL OMENTECTOMY RIGID PROCTOSCOPY  OR FINDINGS:   Patient had chronically inflamed left-sided colon especially in the mid descending colon and sigmoid colon.  Very dense adhesions of thickened omentum along the left colon and splenic flexure.  No obvious metastatic disease on visceral parietal peritoneum or liver.  The anastomosis rests 10 cm from the anal verge by rigid proctoscopy.  It is an end-splenic-flexure-colon to anterior-mid-rectal-wall stapled 31 Ethicon EEAanastomosis  SURGEON:  Adin Hector, MD   Assessment  Doing rather well so far.  [facility] Stay = 1 days)  Plan:  Enhance recovery protocol.  Stop IV fluids.  Advance diet.  Hematuria most likely related to BPH at Foley placement.  Resolving.  DC tomorrow per protocol.  No evidence of any neurological  weakness.  Continue steroids and myasthenia medication   -VTE prophylaxis- SCDs, etc -mobilize as tolerated to help recovery  20 minutes spent in review, evaluation, examination, counseling, and coordination of care.  More than 50% of that time was spent in counseling.  12/27/2018    Subjective: (Chief complaint)  Walking well in hallways.  Soreness minimal.  Wife and nurse in room.  He feels like he is doing rather well so far.  Objective:  Vital signs:  Vitals:   12/26/18 2051 12/26/18 2211 12/27/18 0120 12/27/18 0614  BP: 125/78 115/80 107/74 100/71  Pulse: 69 71 78 65  Resp: 14 14 15 15   Temp: (!) 97.5 F (36.4 C) (!) 97.4 F (36.3 C) 97.6 F (36.4 C) 98.1 F (36.7 C)  TempSrc: Oral Oral Oral Oral  SpO2: 98% 96% 96% 93%  Weight:    90.6 kg  Height:           Intake/Output   Yesterday:  02/28 0701 - 02/29 0700 In: 3225.6 [I.V.:3225.6] Out: 4026 [Urine:3325; Stool:1; Blood:700] This shift:  No intake/output data recorded.  Bowel function:  Flatus: YES  BM:  YES  Drain: (No drain)   Physical Exam:  General: Pt awake/alert/oriented x4 in no acute distress Eyes: PERRL, normal EOM.  Sclera clear.  No icterus Neuro: CN II-XII intact w/o focal sensory/motor deficits. Lymph: No head/neck/groin lymphadenopathy Psych:  No delerium/psychosis/paranoia HENT: Normocephalic, Mucus membranes moist.  No thrush Neck: Supple, No tracheal deviation Chest: No chest wall pain w good excursion CV:  Pulses intact.  Regular rhythm MS: Normal AROM mjr joints.  No obvious deformity  Abdomen: Soft.  Nondistended.  Mildly tender at incisions only.  No evidence of peritonitis.  No incarcerated hernias.  Ext:  No deformity.  No mjr edema.  No cyanosis Skin: No petechiae / purpura  Results:   Labs: Results for orders placed or performed during the hospital encounter of 12/26/18 (from the past 48 hour(s))  Basic metabolic panel     Status: Abnormal   Collection  Time: 12/27/18  4:44 AM  Result Value Ref Range   Sodium 139 135 - 145 mmol/L   Potassium 3.7 3.5 - 5.1 mmol/L   Chloride 106 98 - 111 mmol/L   CO2 27 22 - 32 mmol/L   Glucose, Bld 116 (H) 70 - 99 mg/dL   BUN 9 8 - 23 mg/dL   Creatinine, Ser 0.71 0.61 - 1.24 mg/dL   Calcium 7.9 (L) 8.9 - 10.3 mg/dL   GFR calc non Af Amer >60 >60 mL/min   GFR calc Af Amer >60 >60 mL/min   Anion gap 6 5 - 15    Comment: Performed at [facility], [place] [address], [place], [place] [address]  CBC     Status: Abnormal   Collection Time: 12/27/18  4:44 AM  Result Value Ref Range   WBC 8.1 4.0 - 10.5 K/uL   RBC 3.72 (L) 4.22 - 5.81 MIL/uL   Hemoglobin 11.0 (L) 13.0 - 17.0 g/dL   HCT 35.4 (L) 39.0 - 52.0 %   MCV 95.2 80.0 - 100.0 fL   MCH 29.6 26.0 - 34.0 pg   MCHC 31.1 30.0 - 36.0 g/dL   RDW 13.6 11.5 - 15.5 %   Platelets 202 150 - 400 K/uL   nRBC 0.0 0.0 - 0.2 %    Comment: Performed at [facility], [place] [address], [place], [place] [address]  Magnesium     Status: None   Collection Time: 12/27/18  4:44 AM  Result Value Ref Range   Magnesium 1.9 1.7 - 2.4 mg/dL    Comment: Performed at [facility], [place] [address] [place], [place] [address]    Imaging / Studies: No results found.  Medications / Allergies: per chart  Antibiotics: Anti-infectives (From admission, onward)   Start     Dose/Rate Route Frequency Ordered Stop   12/27/18 0030  cefoTEtan (CEFOTAN) 2 g in sodium chloride 0.9 % 100 mL IVPB     2 g 200 mL/hr over 30 Minutes Intravenous Every 12 hours 12/26/18 1901 12/27/18 0141   12/26/18 1624  clindamycin (CLEOCIN) 900 mg, gentamicin (GARAMYCIN) 240 mg in sodium chloride 0.9 % 1,000 mL for intraperitoneal lavage  Status:  Discontinued       As needed 12/26/18 1624 12/26/18 1714   12/26/18 1400  neomycin (MYCIFRADIN) tablet 1,000 mg  Status:  Discontinued     1,000 mg Oral 3 times per day 12/26/18 1118 12/26/18 1900   12/26/18  1400  metroNIDAZOLE (FLAGYL) tablet 1,000 mg  Status:  Discontinued     1,000 mg Oral 3 times per day 12/26/18 1118 12/26/18 1900   12/26/18 1130  cefoTEtan (CEFOTAN) 2 g in sodium chloride 0.9 % 100 mL IVPB     2 g 200 mL/hr over 30 Minutes Intravenous On call to O.R. 12/26/18 1118 12/26/18 1248   12/26/18 0600  clindamycin (CLEOCIN) 900 mg, gentamicin (GARAMYCIN) 240 mg in sodium chloride 0.9 % 1,000 mL for intraperitoneal lavage  Status:  Discontinued      Intraperitoneal To Surgery 12/25/18 4010 12/26/18 1750        Note: Portions of this report may have been transcribed using voice  recognition software. Every effort was made to ensure accuracy; however, inadvertent computerized transcription errors may be present.   Any transcriptional errors that result from this process are unintentional.     Adin Hector, MD, FACS, MASCRS Gastrointestinal and Minimally Invasive Surgery    [address], [place], [place] [address] [phone] Main / Paging [phone] Fax

## 2018-12-27 NOTE — Progress Notes (Signed)
Pharmacy Brief Note - Alvimopan (Entereg)  The standing order set for alvimopan (Entereg) now includes an automatic order to discontinue the drug after the patient has had a bowel movement. The change was approved by the [employer] [facility] and the Medical Executive Committee.  This patient has had a bowel movement documented by nursing. Therefore, alvimopan has been discontinued. If there are questions, please contact the pharmacy at [phone].  Thank you   Reuel Boom, PharmD, BCPS [phone] 12/27/2018, 2:40 PM

## 2018-12-28 MED ORDER — BISMUTH SUBSALICYLATE 262 MG/15ML PO SUSP
30.0000 mL | Freq: Three times a day (TID) | ORAL | Status: DC | PRN
  Filled 2018-12-28: qty 236

## 2018-12-28 NOTE — Progress Notes (Signed)
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[place])   Idiopathic polyneuropathy   BPH (benign prostatic hyperplasia)   HTN (hypertension)   Hyperlipidemia   History of diverticulitis of colon   History of colonic polyps   Diverticulitis of sigmoid colon   2 Days Post-Op  12/26/2018  POST-OPERATIVE DIAGNOSIS:  Left sided colon diverticulitis  PROCEDURE:  XI ROBOTIC LOW ANTERIOR RESECTION (descending colon to mid-rectum) MOBILIZATION OF SPLENIC FLEXURE OF COLON Assessment of tissue perfusion using Firefly immunofluorescence PARTIAL OMENTECTOMY RIGID PROCTOSCOPY  OR FINDINGS:   Patient had chronically inflamed left-sided colon especially in the mid descending colon and sigmoid colon.  Very dense adhesions of thickened omentum along the left colon and splenic flexure.  No obvious metastatic disease on visceral parietal peritoneum or liver.  The anastomosis rests 10 cm from the anal verge by rigid proctoscopy.  It is an end-splenic-flexure-colon to anterior-mid-rectal-wall stapled 31 Ethicon EEAanastomosis  SURGEON:  Adin Hector, MD   Assessment  Doing rather well so far.  [employer] Stay = 2 days)  Plan:  Enhance recovery protocol.  Stop IV fluids.  Advance diet.  Hematuria most likely related to BPH at Foley placement.  Resolving.  DC tomorrow per protocol.  No evidence of any neurological  weakness.  Continue steroids and myasthenia medication  VTE prophylaxis- SCDs, etc  mobilize as tolerated to help recovery  D/C patient from hospital when patient meets criteria (anticipate in 1-2 day(s)):-  Tolerating oral intake well Ambulating well Adequate pain control without IV medications Urinating  Having flatus Disposition planning in place   20 minutes spent in review, evaluation, examination, counseling, and coordination of care.  More than 50% of that time was spent in counseling.  12/28/2018    Subjective: (Chief complaint)  Walking well in hallways.  Soreness minimal.  Tolerated blenderized foods.  Trying soft foods.  Urinated a few clots with Foley removal but nothing too severe.  Loose bowel movements.  NO incontinence.  Objective:  Vital signs:  Vitals:   12/27/18 0944 12/27/18 1434 12/27/18 2100 12/28/18 0639  BP: 90/61 102/68 115/77 106/69  Pulse: 86 76 74 69  Resp: 16 16 18 18   Temp: 97.7 F (36.5 C) 97.6 F (36.4 C) (!) 97.5 F (36.4 C) 98 F (36.7 C)  TempSrc: Oral Oral Oral Oral  SpO2: 90% 97% 95% 93%  Weight:      Height:        Last BM Date: 12/27/18  Intake/Output   Yesterday:  02/29 0701 - 03/01 0700 In: 840 [P.O.:840] Out: 1450 [Urine:1450] This shift:  No intake/output data recorded.  Bowel function:  Flatus: YES  BM:  YES  Drain: (No drain)   Physical Exam:  General: Pt awake/alert/oriented x4 in no acute distress Eyes: PERRL, normal EOM.  Sclera clear.  No icterus Neuro: CN II-XII intact w/o focal sensory/motor deficits. Lymph: No head/neck/groin lymphadenopathy Psych:  No delerium/psychosis/paranoia HENT: Normocephalic, Mucus membranes moist.  No thrush Neck: Supple, No tracheal deviation Chest: No chest wall  pain w good excursion CV:  Pulses intact.  Regular rhythm MS: Normal AROM mjr joints.  No obvious deformity  Abdomen: Soft.  Nondistended.  Mildly tender at incisions only.  No evidence of  peritonitis.  No incarcerated hernias.  Ext:  No deformity.  No mjr edema.  No cyanosis Skin: No petechiae / purpura  Results:   Labs: Results for orders placed or performed during the hospital encounter of 12/26/18 (from the past 48 hour(s))  Basic metabolic panel     Status: Abnormal   Collection Time: 12/27/18  4:44 AM  Result Value Ref Range   Sodium 139 135 - 145 mmol/L   Potassium 3.7 3.5 - 5.1 mmol/L   Chloride 106 98 - 111 mmol/L   CO2 27 22 - 32 mmol/L   Glucose, Bld 116 (H) 70 - 99 mg/dL   BUN 9 8 - 23 mg/dL   Creatinine, Ser 0.71 0.61 - 1.24 mg/dL   Calcium 7.9 (L) 8.9 - 10.3 mg/dL   GFR calc non Af Amer >60 >60 mL/min   GFR calc Af Amer >60 >60 mL/min   Anion gap 6 5 - 15    Comment: Performed at [facility], [place] [address] [place], [place] [address]  CBC     Status: Abnormal   Collection Time: 12/27/18  4:44 AM  Result Value Ref Range   WBC 8.1 4.0 - 10.5 K/uL   RBC 3.72 (L) 4.22 - 5.81 MIL/uL   Hemoglobin 11.0 (L) 13.0 - 17.0 g/dL   HCT 35.4 (L) 39.0 - 52.0 %   MCV 95.2 80.0 - 100.0 fL   MCH 29.6 26.0 - 34.0 pg   MCHC 31.1 30.0 - 36.0 g/dL   RDW 13.6 11.5 - 15.5 %   Platelets 202 150 - 400 K/uL   nRBC 0.0 0.0 - 0.2 %    Comment: Performed at [facility], [place] [address] [place], [place] [address]  Magnesium     Status: None   Collection Time: 12/27/18  4:44 AM  Result Value Ref Range   Magnesium 1.9 1.7 - 2.4 mg/dL    Comment: Performed at [facility], [place] [address] [place], [place] [address]    Imaging / Studies: No results found.  Medications / Allergies: per chart  Antibiotics: Anti-infectives (From admission, onward)   Start     Dose/Rate Route Frequency Ordered Stop   12/27/18 0030  cefoTEtan (CEFOTAN) 2 g in sodium chloride 0.9 % 100 mL IVPB     2 g 200 mL/hr over 30 Minutes Intravenous Every 12 hours 12/26/18 1901 12/27/18 0141   12/26/18 1624  clindamycin (CLEOCIN) 900 mg,  gentamicin (GARAMYCIN) 240 mg in sodium chloride 0.9 % 1,000 mL for intraperitoneal lavage  Status:  Discontinued       As needed 12/26/18 1624 12/26/18 1714   12/26/18 1400  neomycin (MYCIFRADIN) tablet 1,000 mg  Status:  Discontinued     1,000 mg Oral 3 times per day 12/26/18 1118 12/26/18 1900   12/26/18 1400  metroNIDAZOLE (FLAGYL) tablet 1,000 mg  Status:  Discontinued     1,000 mg Oral 3 times per day 12/26/18 1118 12/26/18 1900   12/26/18 1130  cefoTEtan (CEFOTAN) 2 g in sodium chloride 0.9 % 100 mL IVPB     2 g 200 mL/hr over 30 Minutes Intravenous On call to O.R. 12/26/18 1118 12/26/18 1248   12/26/18 0600  clindamycin (CLEOCIN) 900 mg, gentamicin (GARAMYCIN) 240 mg in  sodium chloride 0.9 % 1,000 mL for intraperitoneal lavage  Status:  Discontinued      Intraperitoneal To Surgery 12/25/18 7867 12/26/18 1750        Note: Portions of this report may have been transcribed using voice recognition software. Every effort was made to ensure accuracy; however, inadvertent computerized transcription errors may be present.   Any transcriptional errors that result from this process are unintentional.     Adin Hector, MD, FACS, MASCRS Gastrointestinal and Minimally Invasive Surgery    [address] [place], [place] [address] [phone] Main / Paging [phone] Fax

## 2018-12-29 ENCOUNTER — Encounter (HOSPITAL_COMMUNITY): Payer: Self-pay | Admitting: Surgery

## 2018-12-29 MED ORDER — TRAMADOL HCL 50 MG PO TABS: 50.0000 mg | ORAL_TABLET | Freq: Four times a day (QID) | ORAL | 0 refills | Status: DC | PRN

## 2018-12-29 NOTE — OR Nursing (Signed)
Looked in chart to see specimen.   Anthon Harpole, RN3, BSN

## 2018-12-29 NOTE — Progress Notes (Addendum)
Discharge and medication instructions reviewed with patient, daughter and spouse. Questions answered. Daughter and spouse requesting home care services. Dr [name] paged about a Case Manager consult. Spouse and daughter are driving patient home. Upon re-entering patient's room, wife states she has already arranged home care for patient. Donne Hazel, RN

## 2018-12-29 NOTE — Consult Note (Signed)
[place] Nurse requested for preoperative stoma site marking  Discussed surgical procedure and stoma creation with patient and family.  Explained role of the [place] nurse team.  Provided the patient with educational booklet and provided samples of pouching options.  Answered patient and family questions.   Examined patient sitting  and standing in order to place the marking in the patient's visual field, away from any creases or abdominal contour issues and within the rectus muscle.  Discussed at length marking the   Marked for colostomy in the LLQ  5 cm to the left of the umbilicus and 2.5 cm above the umbilicus.  Patient's abdomen cleansed with CHG wipes at site markings, allowed to air dry prior to marking.Covered mark with thin film transparent dressing to preserve mark until date of surgery.   [place] Nurse team will follow up with patient after surgery for continue ostomy care and teaching.  [place] MSN, [place], [place], [place]

## 2018-12-29 NOTE — Discharge Summary (Signed)
Physician Discharge Summary    Patient ID: [name] MRN: [id number] DOB/AGE: 80-Feb-1942  78 y.o.  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 Active Problems:   Myasthenia ([place])   Idiopathic polyneuropathy   BPH (benign prostatic hyperplasia)   HTN (hypertension)   Hyperlipidemia   History of diverticulitis of colon   History of colonic polyps   Diverticulitis of sigmoid colon   3 Days Post-Op  12/26/2018  POST-OPERATIVE DIAGNOSIS:  Left sided colon diverticulitis  PROCEDURE:  XI ROBOTIC LOW ANTERIOR RESECTION (descending colon to mid-rectum) MOBILIZATION OF SPLENIC FLEXURE OF COLON Assessment of tissue perfusion using Firefly immunofluorescence PARTIAL OMENTECTOMY RIGID PROCTOSCOPY  SURGEON:  Adin Hector, MD  Consults: None  Hospital Course:   Patients with episode of diverticulitis left-sided requiring hospitalization.  Recommendation made for segmental colonic resection to break the cycle of attacks.  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  Steroids minimized and cleared by medicine and neurology.  The patient underwent the surgery above.  Postoperatively, the patient gradually mobilized and advanced to a solid diet.  Pain and other symptoms were treated aggressively.    By the time of discharge, the patient was walking well the hallways, eating food, having flatus.  Pain was well-controlled on an oral medications.  He did have some hematuria with clots after Foley placement but that resolved quickly.  He was urinating well with minimal hematuria,  nearly resolved.  Based on meeting discharge criteria and continuing to recover, I felt it was safe for the patient to be discharged from the hospital to further recover with close followup. Postoperative recommendations were discussed in detail to the patient and his wife.  They are written as well.  They are comfortable with going home today  Discharged Condition: good  Discharge Exam: Blood pressure 113/78, pulse 66, temperature 98.2 F (36.8 C), temperature source Oral, resp. rate 18, height 6' (1.829 m), weight 90.6 kg, SpO2 93 %.  General: Pt awake/alert/oriented x4 in No acute distress.  Sitting up in chair smiling.  Getting up and walking in room normally.  No guarding. Eyes: PERRL, normal EOM.  Sclera clear.  No icterus Neuro: CN II-XII intact w/o focal sensory/motor deficits. Lymph: No head/neck/groin lymphadenopathy Psych:  No delerium/psychosis/paranoia HENT: Normocephalic, Mucus membranes moist.  No thrush Neck: Supple, No tracheal deviation Chest: No chest wall pain w good excursion CV:  Pulses intact.  Regular rhythm MS: Normal AROM mjr joints.  No obvious deformity Abdomen: Soft.  Nondistended.  Nontender.  No evidence of peritonitis.  No incarcerated hernias. Ext:  SCDs BLE.  No mjr edema.  No cyanosis Skin: No petechiae / purpura   Disposition:   Follow-up Information    [name], MD. Schedule an appointment as soon as possible for a visit in 3 weeks.   Specialty:  General Surgery Why:  To follow up after your operation, To follow up after your hospital stay Contact information: [place] [address] [phone]           Discharge disposition: 01-Home or Self Care       Discharge Instructions    Call MD for:  Complete by:  As directed    FEVER > 101.5 F  (temperatures < 101.5 F are not significant)   Call MD for:  extreme fatigue   Complete by:  As directed    Call MD for:  persistant dizziness or  light-headedness   Complete by:  As directed    Call MD for:  persistant nausea and vomiting   Complete by:  As directed    Call MD for:  redness, tenderness, or signs of infection (pain, swelling, redness, odor or green/yellow discharge around incision site)   Complete by:  As directed    Call MD for:  severe uncontrolled pain   Complete by:  As directed    Diet - low sodium heart healthy   Complete by:  As directed    Start with a bland diet such as soups, liquids, starchy foods, low fat foods, etc. the first few days at home. Gradually advance to a solid, low-fat, high fiber diet by the end of the first week at home.   Add a fiber supplement to your diet (Metamucil, etc) If you feel full, bloated, or constipated, stay on a full liquid or pureed/blenderized diet for a few days until you feel better and are no longer constipated.   Discharge instructions   Complete by:  As directed    See Discharge Instructions If you are not getting better after two weeks or are noticing you are getting worse, contact our office [phone] for further advice.  We may need to adjust your medications, re-evaluate you in the office, send you to the emergency room, or see what other things we can do to help. The clinic staff is available to answer your questions during regular business hours (8:30am-5pm).  Please don't hesitate to call and ask to speak to one of our nurses for clinical concerns.    A surgeon from [facility] is always on call at the hospitals 24 hours/day If you have a medical emergency, go to the nearest emergency room or call 911.   Discharge wound care:   Complete by:  As directed    It is good for closed incisions and even open wounds to be washed every day.  Shower every day.  Short baths are fine.  Wash the incisions and wounds clean with soap & water.     You may leave closed incisions open to air if it is dry.   You may cover the incision with clean gauze & replace it  after your daily shower for comfort.   Driving Restrictions   Complete by:  As directed    You may drive when: - you are no longer taking narcotic prescription pain medication - you can comfortably wear a seatbelt - you can safely make sudden turns/stops without pain.   Increase activity slowly   Complete by:  As directed    Start light daily activities --- self-care, walking, climbing stairs- beginning the day after surgery.  Gradually increase activities as tolerated.  Control your pain to be active.  Stop when you are tired.  Ideally, walk several times a day, eventually an hour a day.   Most people are back to most day-to-day activities in a few weeks.  It takes 4-6 weeks to get back to unrestricted, intense activity. If you can walk 30 minutes without difficulty, it is safe to try more intense activity such as jogging, treadmill, bicycling, low-impact aerobics, swimming, etc. Save the most intensive and strenuous activity for  last (Usually 4-8 weeks after surgery) such as sit-ups, heavy lifting, contact sports, etc.  Refrain from any intense heavy lifting or straining until you are off narcotics for pain control.  You will have off days, but things should improve week-by-week. DO NOT PUSH THROUGH PAIN.  Let pain be your guide: If it hurts to do something, don't do it.   Lifting restrictions   Complete by:  As directed    If you can walk 30 minutes without difficulty, it is safe to try more intense activity such as jogging, treadmill, bicycling, low-impact aerobics, swimming, etc. Save the most intensive and strenuous activity for last (Usually 4-8 weeks after surgery) such as sit-ups, heavy lifting, contact sports, etc.   Refrain from any intense heavy lifting or straining until you are off narcotics for pain control.  You will have off days, but things should improve week-by-week. DO NOT PUSH THROUGH PAIN.  Let pain be your guide: If it hurts to do something, don't do it.  Pain is your body  warning you to avoid that activity for another week until the pain goes down.   May shower / Bathe   Complete by:  As directed    May walk up steps   Complete by:  As directed    Sexual Activity Restrictions   Complete by:  As directed    You may have sexual intercourse when it is comfortable. If it hurts to do something, stop.      Allergies as of 12/29/2018      Reactions   Pollen Extract Other (See Comments)   Nasal congestion      Medication List    TAKE these medications   aspirin EC 81 MG tablet Take 1 tablet (81 mg total) by mouth daily.   atorvastatin 10 MG tablet Commonly known as:  LIPITOR Take 1 tablet (10 mg total) by mouth daily at 6 PM.   CALCIUM 600+D 600-400 MG-UNIT tablet Generic drug:  Calcium Carbonate-Vitamin D Take 1 tablet by mouth daily.   doxazosin 8 MG tablet Commonly known as:  CARDURA Take 8 mg by mouth daily.   feeding supplement (ENSURE ENLIVE) Liqd Take 237 mLs by mouth 3 (three) times daily between meals. What changed:  when to take this   finasteride 5 MG tablet Commonly known as:  PROSCAR Take 5 mg by mouth every morning.   Glucosamine HCl 1500 MG Tabs Take 1,500 mg by mouth daily.   ibuprofen 200 MG tablet Commonly known as:  ADVIL,MOTRIN Take 200 mg by mouth every 6 (six) hours as needed for headache or moderate pain.   multivitamin with minerals Tabs tablet Take 1 tablet by mouth daily.   predniSONE 10 MG tablet Commonly known as:  DELTASONE Reduce prednisone to alternating days of 15mg  and 10mg  for two weeks, then reduce to 10mg  daily.  Stay on predinsone 10mg  daily. What changed:    how much to take  how to take this  when to take this  additional instructions   pyridostigmine 60 MG tablet Commonly known as:  MESTINON Take 0.5 tablets (30 mg total) by mouth 3 (three) times daily. Take half tablet at 8am, 1pm, and 6pm What changed:  additional instructions   traMADol 50 MG tablet Commonly known as:   ULTRAM Take 1-2 tablets (50-100 mg total) by mouth every 6 (six) hours as needed for moderate pain or severe pain (mild pain).   VITAMIN B-12 PO Take 5,000 mcg by mouth daily.  Discharge Care Instructions  (From admission, onward)         Start     Ordered   12/29/18 0000  Discharge wound care:    Comments:  It is good for closed incisions and even open wounds to be washed every day.  Shower every day.  Short baths are fine.  Wash the incisions and wounds clean with soap & water.     You may leave closed incisions open to air if it is dry.   You may cover the incision with clean gauze & replace it after your daily shower for comfort.   12/29/18 0747          Significant Diagnostic Studies:  Results for orders placed or performed during the hospital encounter of 12/26/18 (from the past 72 hour(s))  Basic metabolic panel     Status: Abnormal   Collection Time: 12/27/18  4:44 AM  Result Value Ref Range   Sodium 139 135 - 145 mmol/L   Potassium 3.7 3.5 - 5.1 mmol/L   Chloride 106 98 - 111 mmol/L   CO2 27 22 - 32 mmol/L   Glucose, Bld 116 (H) 70 - 99 mg/dL   BUN 9 8 - 23 mg/dL   Creatinine, Ser 0.71 0.61 - 1.24 mg/dL   Calcium 7.9 (L) 8.9 - 10.3 mg/dL   GFR calc non Af Amer >60 >60 mL/min   GFR calc Af Amer >60 >60 mL/min   Anion gap 6 5 - 15    Comment: Performed at [facility], [place] [address] [place], [place] [address]  CBC     Status: Abnormal   Collection Time: 12/27/18  4:44 AM  Result Value Ref Range   WBC 8.1 4.0 - 10.5 K/uL   RBC 3.72 (L) 4.22 - 5.81 MIL/uL   Hemoglobin 11.0 (L) 13.0 - 17.0 g/dL   HCT 35.4 (L) 39.0 - 52.0 %   MCV 95.2 80.0 - 100.0 fL   MCH 29.6 26.0 - 34.0 pg   MCHC 31.1 30.0 - 36.0 g/dL   RDW 13.6 11.5 - 15.5 %   Platelets 202 150 - 400 K/uL   nRBC 0.0 0.0 - 0.2 %    Comment: Performed at [facility], [place] [address] [place], [place] [address]  Magnesium     Status: None   Collection  Time: 12/27/18  4:44 AM  Result Value Ref Range   Magnesium 1.9 1.7 - 2.4 mg/dL    Comment: Performed at [facility], [place] [address], [place], [place] [address]    No results found.  Past Medical History:  Diagnosis Date  . Abscess of sigmoid colon due to diverticulitis 12/26/2018  . Appendicitis 09/05/2014  . Colon polyp   . Complication of anesthesia    Difficult to arouse, Combative x 1  . Diverticulitis   . Diverticulosis    extensive 3'15.  . Dysrhythmia    hx. Ablation for tachycardia, no routine cardiology visits now  . ED (erectile dysfunction)   . Heart murmur    teenager  . History of colon polyps   . History of kidney stones    x1 passed  . Hyperlipidemia   . Myasthenia gravis ([place])   . 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 Osteoarthritis   . Palpitation    with svt rate 150  . 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  Tendinitis    achiles heel spur  . Testicular atrophy   . Tubular adenoma of colon     Past Surgical History:  Procedure Laterality Date  . CARDIAC ELECTROPHYSIOLOGY MAPPING AND ABLATION  2010   cardiac   . COLONOSCOPY  11/2018   Dr [name].  Many polyps.  rec f/u colonoscopy summer 2020  . EUS N/A 11/17/2014   Procedure: ESOPHAGEAL ENDOSCOPIC ULTRASOUND (EUS) RADIAL;  Surgeon: [name], MD;  Location: [facility];  Service: Endoscopy;  Laterality: N/A;  . FINE NEEDLE ASPIRATION N/A 11/17/2014   Procedure: FINE NEEDLE ASPIRATION (FNA) LINEAR;  Surgeon: [name], MD;  Location: [facility];  Service: Endoscopy;  Laterality: N/A;  + or- fna  . FLEXIBLE SIGMOIDOSCOPY    . HEMORRHOID SURGERY     banding   . INGUINAL HERNIA REPAIR Bilateral 2005   '05 bilateral hernia  . KNEE ARTHROSCOPY Left 2008   scope '08  . LAPAROSCOPIC APPENDECTOMY N/A 09/05/2014   Procedure: APPENDECTOMY LAPAROSCOPIC;  Surgeon: [name], MD;  Location: [place];  Service: General;  Laterality: N/A;  . ROTATOR CUFF REPAIR Right 2016    Social History   Socioeconomic History  . Marital status: Married    Spouse name: Not on file  . Number of children: 3  . Years of education: 12  . Highest education level: Some college, no degree  Occupational History  . Occupation: sales-Retired, works one day a week  Social Needs  . Financial resource strain: Not on file  . Food insecurity:    Worry: Not on file    Inability: Not on file  . Transportation needs:    Medical: Not on file    Non-medical: Not on file  Tobacco Use  . Smoking status: Former Smoker    Packs/day: 1.00    Years: 47.00    Pack years: 47.00    Types: Cigarettes    Last attempt to quit: 10/29/2000    Years since quitting: 18.1  . Smokeless tobacco: Never Used  Substance and Sexual Activity  . Alcohol use: Yes    Alcohol/week: 1.0 standard drinks    Types: 1 Standard drinks or equivalent per week    Comment: socially- nightly 1 cocktail  . Drug use: No  . Sexual activity: Not on file  Lifestyle  . Physical activity:    Days per week: Not on file    Minutes per session: Not on file  . Stress: Not on file  Relationships  . Social connections:    Talks on phone: Not on file    Gets together: Not on file    Attends religious service: Not on file    Active member of club or organization: Not on file    Attends meetings of clubs or organizations: Not on file    Relationship status: Not on file  . Intimate partner violence:    Fear of current or ex partner: Not on file    Emotionally abused: Not on file    Physically abused: Not on file    Forced sexual activity: Not on file  Other Topics Concern  . Not on file  Social History Narrative   Lives with wife in a one story home with a basement.  Has 3 daughters.  Works one day a week at the [employer].  Retired from [employer].  Education: some college.    Caffeine use: decaf   Right handed    Family History  Problem  Relation Age of  Onset  . Pancreatic cancer Father   . Stroke Mother   . Aneurysm Brother   . Neuropathy Neg Hx     Current Facility-Administered Medications  Medication Dose Route Frequency Provider Last Rate Last Dose  . acetaminophen (TYLENOL) tablet 1,000 mg  1,000 mg Oral Lajuana Ripple, MD   1,000 mg at 12/29/18 0511  . alum & mag hydroxide-simeth (MAALOX/MYLANTA) 200-200-20 MG/5ML suspension 30 mL  30 mL Oral Q6H PRN [name], MD      . aspirin EC tablet 81 mg  81 mg Oral Daily [name], MD   81 mg at 12/28/18 0951  . atorvastatin (LIPITOR) tablet 10 mg  10 mg Oral q1800 [name], MD   10 mg at 12/28/18 1849  . bismuth subsalicylate (PEPTO BISMOL) 262 MG/15ML suspension 30 mL  30 mL Oral Q8H PRN [name], MD      . calcium-vitamin D (OSCAL WITH D) 500-200 MG-UNIT per tablet 1 tablet  1 tablet Oral Daily [name], MD   1 tablet at 12/28/18 [phone]  . diphenhydrAMINE (BENADRYL) 12.5 MG/5ML elixir 12.5 mg  12.5 mg Oral Q6H PRN [name], MD       Or  . diphenhydrAMINE (BENADRYL) injection 12.5 mg  12.5 mg Intravenous Q6H PRN [name], MD      . doxazosin (CARDURA) tablet 8 mg  8 mg Oral Daily [name], MD   8 mg at 12/28/18 0950  . enalaprilat (VASOTEC) injection 0.625-1.25 mg  0.625-1.25 mg Intravenous Q6H PRN [name], MD      . enoxaparin (LOVENOX) injection 40 mg  40 mg Subcutaneous Q24H [name], MD   40 mg at 12/28/18 0818  . feeding supplement (ENSURE SURGERY) liquid 237 mL  237 mL Oral BID BM [name], MD   237 mL at 12/28/18 1453  . finasteride (PROSCAR) tablet 5 mg  5 mg Oral q morning - Lorenza Burton, MD   5 mg at 12/28/18 2993  . gabapentin (NEURONTIN) capsule 300 mg  300 mg Oral BID [name], MD   300 mg at 12/28/18 2144  . hydrALAZINE (APRESOLINE) injection 10 mg  10 mg Intravenous Q2H PRN [name], MD      . HYDROmorphone (DILAUDID) injection 0.5-2 mg  0.5-2 mg Intravenous Q4H PRN [name], MD      .  ibuprofen (ADVIL,MOTRIN) tablet 200 mg  200 mg Oral Q6H PRN [name], MD      . lip balm (CARMEX) ointment 1 application  1 application Topical BID [name], MD   1 application at 71/69/67 2130  . magic mouthwash  15 mL Oral QID PRN [name], MD      . metoCLOPramide (REGLAN) injection 10 mg  10 mg Intravenous Q6H PRN [name], MD      . metoprolol tartrate (LOPRESSOR) injection 5 mg  5 mg Intravenous Q6H PRN [name], MD      . multivitamin with minerals tablet 1 tablet  1 tablet Oral Daily [name], MD   1 tablet at 12/28/18 [phone]  . ondansetron (ZOFRAN) tablet 4 mg  4 mg Oral Q6H PRN [name], MD       Or  . ondansetron [facility]) injection 4 mg  4 mg Intravenous Q6H PRN [name], MD      . predniSONE (DELTASONE) tablet 10 mg  10 mg Oral Q breakfast [name], MD   10 mg at 12/28/18 0818  . prochlorperazine (COMPAZINE) tablet 10  mg  10 mg Oral Q6H PRN [name], MD       Or  . prochlorperazine (COMPAZINE) injection 5-10 mg  5-10 mg Intravenous Q6H PRN [name], MD      . pyridostigmine (MESTINON) tablet 30 mg  30 mg Oral TID [name], MD   30 mg at 12/28/18 1850  . saccharomyces boulardii (FLORASTOR) capsule 250 mg  250 mg Oral BID [name], MD   250 mg at 12/28/18 2144  . traMADol (ULTRAM) tablet 50-100 mg  50-100 mg Oral Q6H PRN [name], MD   50 mg at 12/28/18 2147     Allergies  Allergen Reactions  . Pollen Extract Other (See Comments)    Nasal congestion    Signed: Morton Peters, MD, FACS, MASCRS Gastrointestinal and Minimally Invasive Surgery    [address], [place], [place] [address] [phone] Main / Paging [phone] Fax   12/29/2018, 7:48 AM

## 2018-12-30 DIAGNOSIS — Z48815 Encounter for surgical aftercare following surgery on the digestive system: Secondary | ICD-10-CM | POA: Diagnosis not present

## 2018-12-30 DIAGNOSIS — G7000 Myasthenia gravis without (acute) exacerbation: Secondary | ICD-10-CM | POA: Diagnosis not present

## 2018-12-30 DIAGNOSIS — Z9049 Acquired absence of other specified parts of digestive tract: Secondary | ICD-10-CM | POA: Diagnosis not present

## 2018-12-30 DIAGNOSIS — G609 Hereditary and idiopathic neuropathy, unspecified: Secondary | ICD-10-CM | POA: Diagnosis not present

## 2018-12-30 DIAGNOSIS — Z87891 Personal history of nicotine dependence: Secondary | ICD-10-CM | POA: Diagnosis not present

## 2018-12-30 DIAGNOSIS — I10 Essential (primary) hypertension: Secondary | ICD-10-CM | POA: Diagnosis not present

## 2019-01-01 DIAGNOSIS — I10 Essential (primary) hypertension: Secondary | ICD-10-CM | POA: Diagnosis not present

## 2019-01-01 DIAGNOSIS — Z87891 Personal history of nicotine dependence: Secondary | ICD-10-CM | POA: Diagnosis not present

## 2019-01-01 DIAGNOSIS — G609 Hereditary and idiopathic neuropathy, unspecified: Secondary | ICD-10-CM | POA: Diagnosis not present

## 2019-01-01 DIAGNOSIS — Z9049 Acquired absence of other specified parts of digestive tract: Secondary | ICD-10-CM | POA: Diagnosis not present

## 2019-01-01 DIAGNOSIS — Z48815 Encounter for surgical aftercare following surgery on the digestive system: Secondary | ICD-10-CM | POA: Diagnosis not present

## 2019-01-01 DIAGNOSIS — G7000 Myasthenia gravis without (acute) exacerbation: Secondary | ICD-10-CM | POA: Diagnosis not present

## 2019-01-05 DIAGNOSIS — Z48815 Encounter for surgical aftercare following surgery on the digestive system: Secondary | ICD-10-CM | POA: Diagnosis not present

## 2019-01-05 DIAGNOSIS — G609 Hereditary and idiopathic neuropathy, unspecified: Secondary | ICD-10-CM | POA: Diagnosis not present

## 2019-01-05 DIAGNOSIS — Z9049 Acquired absence of other specified parts of digestive tract: Secondary | ICD-10-CM | POA: Diagnosis not present

## 2019-01-05 DIAGNOSIS — G7000 Myasthenia gravis without (acute) exacerbation: Secondary | ICD-10-CM | POA: Diagnosis not present

## 2019-01-05 DIAGNOSIS — Z87891 Personal history of nicotine dependence: Secondary | ICD-10-CM | POA: Diagnosis not present

## 2019-01-05 DIAGNOSIS — I10 Essential (primary) hypertension: Secondary | ICD-10-CM | POA: Diagnosis not present

## 2019-01-08 DIAGNOSIS — D649 Anemia, unspecified: Secondary | ICD-10-CM | POA: Diagnosis not present

## 2019-01-08 DIAGNOSIS — R350 Frequency of micturition: Secondary | ICD-10-CM | POA: Diagnosis not present

## 2019-01-08 DIAGNOSIS — J449 Chronic obstructive pulmonary disease, unspecified: Secondary | ICD-10-CM | POA: Diagnosis not present

## 2019-01-08 DIAGNOSIS — J309 Allergic rhinitis, unspecified: Secondary | ICD-10-CM | POA: Diagnosis not present

## 2019-01-09 ENCOUNTER — Ambulatory Visit
Admission: RE | Admit: 2019-01-09 | Discharge: 2019-01-09 | Disposition: A | Discharge status: Home / Self Care | Payer: Medicare Other | Source: Ambulatory Visit | Attending: Family Medicine | Admitting: Family Medicine

## 2019-01-09 ENCOUNTER — Other Ambulatory Visit: Payer: Self-pay

## 2019-01-09 DIAGNOSIS — Z9049 Acquired absence of other specified parts of digestive tract: Secondary | ICD-10-CM | POA: Diagnosis not present

## 2019-01-09 DIAGNOSIS — Z87891 Personal history of nicotine dependence: Secondary | ICD-10-CM | POA: Diagnosis not present

## 2019-01-09 DIAGNOSIS — R918 Other nonspecific abnormal finding of lung field: Secondary | ICD-10-CM | POA: Diagnosis not present

## 2019-01-09 DIAGNOSIS — G609 Hereditary and idiopathic neuropathy, unspecified: Secondary | ICD-10-CM | POA: Diagnosis not present

## 2019-01-09 DIAGNOSIS — I10 Essential (primary) hypertension: Secondary | ICD-10-CM | POA: Diagnosis not present

## 2019-01-09 DIAGNOSIS — Z48815 Encounter for surgical aftercare following surgery on the digestive system: Secondary | ICD-10-CM | POA: Diagnosis not present

## 2019-01-09 DIAGNOSIS — R911 Solitary pulmonary nodule: Secondary | ICD-10-CM

## 2019-01-09 DIAGNOSIS — G7000 Myasthenia gravis without (acute) exacerbation: Secondary | ICD-10-CM | POA: Diagnosis not present

## 2019-01-13 DIAGNOSIS — I10 Essential (primary) hypertension: Secondary | ICD-10-CM | POA: Diagnosis not present

## 2019-01-13 DIAGNOSIS — G7000 Myasthenia gravis without (acute) exacerbation: Secondary | ICD-10-CM | POA: Diagnosis not present

## 2019-01-13 DIAGNOSIS — G609 Hereditary and idiopathic neuropathy, unspecified: Secondary | ICD-10-CM | POA: Diagnosis not present

## 2019-01-13 DIAGNOSIS — Z48815 Encounter for surgical aftercare following surgery on the digestive system: Secondary | ICD-10-CM | POA: Diagnosis not present

## 2019-01-13 DIAGNOSIS — Z9049 Acquired absence of other specified parts of digestive tract: Secondary | ICD-10-CM | POA: Diagnosis not present

## 2019-01-13 DIAGNOSIS — Z87891 Personal history of nicotine dependence: Secondary | ICD-10-CM | POA: Diagnosis not present

## 2019-01-15 DIAGNOSIS — Z48815 Encounter for surgical aftercare following surgery on the digestive system: Secondary | ICD-10-CM | POA: Diagnosis not present

## 2019-01-15 DIAGNOSIS — G609 Hereditary and idiopathic neuropathy, unspecified: Secondary | ICD-10-CM | POA: Diagnosis not present

## 2019-01-15 DIAGNOSIS — G7000 Myasthenia gravis without (acute) exacerbation: Secondary | ICD-10-CM | POA: Diagnosis not present

## 2019-01-15 DIAGNOSIS — Z87891 Personal history of nicotine dependence: Secondary | ICD-10-CM | POA: Diagnosis not present

## 2019-01-15 DIAGNOSIS — Z9049 Acquired absence of other specified parts of digestive tract: Secondary | ICD-10-CM | POA: Diagnosis not present

## 2019-01-15 DIAGNOSIS — I10 Essential (primary) hypertension: Secondary | ICD-10-CM | POA: Diagnosis not present

## 2019-01-28 ENCOUNTER — Encounter: Payer: Self-pay | Admitting: *Deleted

## 2019-01-28 ENCOUNTER — Telehealth (INDEPENDENT_AMBULATORY_CARE_PROVIDER_SITE_OTHER): Payer: Medicare Other | Admitting: Neurology

## 2019-01-28 ENCOUNTER — Encounter: Payer: Self-pay | Admitting: Neurology

## 2019-01-28 ENCOUNTER — Other Ambulatory Visit: Payer: Self-pay

## 2019-01-28 VITALS — Ht 72.0 in | Wt 185.0 lb

## 2019-01-28 DIAGNOSIS — G7000 Myasthenia gravis without (acute) exacerbation: Secondary | ICD-10-CM | POA: Diagnosis not present

## 2019-01-28 NOTE — Progress Notes (Signed)
   Virtual Visit via Telephone Note The purpose of this virtual visit is to provide medical care while limiting exposure to the novel coronavirus.    Consent was obtained for video visit:  Yes.   Answered questions that patient had about telehealth interaction:  Yes.   I discussed the limitations, risks, security and privacy concerns of performing an evaluation and management service by telemedicine. I also discussed with the patient that there may be a patient responsible charge related to this service. The patient expressed understanding and agreed to proceed.  Pt location: Home Physician Location: office Name of referring provider:  Hulan Fess, MD I connected with [name] at patients initiation/request on 01/28/2019 at 10:00 AM EDT by video enabled telemedicine application and verified that I am speaking with the correct person using two identifiers. Pt MRN:  [id number] Pt DOB:  [birth year] Participants:  [name]   History of Present Illness: This is a 78 year-old man returning for follow-up of seropositive myasthenia gravis. For myasthenia, he remains on prednisone 10mg  and has right droopy eyelid, but denies any double vision, difficulty swallowing, limb weakness, or facial weakness.    He underwent robotic left colectomy for diverticulosis in February 2020 and has been recovering well.  No post-op complication.  He is awake, alert, and easily engages in conversation.  No dysarthria.  Video call was attempted but due to technical difficulty, visit was transitioned to telephone.  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  He is doing well on prednisone taper and down to 10mg  daily. I will keep him on prednisone 10mg  daily until I get better assess him on video or in the office.  Continue mestinon 30mg  at 8am, 1pm, and 6pm.   Follow Up Instructions:   I discussed the assessment and treatment plan with the patient. The patient was  provided an opportunity to ask questions and all were answered. The patient agreed with the plan and demonstrated an understanding of the instructions.   The patient was advised to call back or seek an in-person evaluation if the symptoms worsen or if the condition fails to improve as anticipated.  Follow-up in 1 month  Total Time spent in visit with the patient was:  15 min, of which more than 50% of the time was spent in counseling and/or coordinating care.   Pt understands and agrees with the plan of care outlined.     Alda Berthold, DO

## 2019-02-16 ENCOUNTER — Ambulatory Visit: Payer: Medicare Other | Admitting: Neurology

## 2019-02-26 ENCOUNTER — Encounter: Payer: Self-pay | Admitting: *Deleted

## 2019-03-02 ENCOUNTER — Encounter: Payer: Self-pay | Admitting: *Deleted

## 2019-03-02 ENCOUNTER — Other Ambulatory Visit: Payer: Self-pay

## 2019-03-02 ENCOUNTER — Telehealth (INDEPENDENT_AMBULATORY_CARE_PROVIDER_SITE_OTHER): Payer: Medicare Other | Admitting: Neurology

## 2019-03-02 ENCOUNTER — Encounter: Payer: Self-pay | Admitting: Neurology

## 2019-03-02 VITALS — Ht 72.0 in | Wt 187.0 lb

## 2019-03-02 DIAGNOSIS — G7001 Myasthenia gravis with (acute) exacerbation: Secondary | ICD-10-CM | POA: Diagnosis not present

## 2019-03-02 NOTE — Progress Notes (Signed)
   Virtual Visit via Video Note The purpose of this virtual visit is to provide medical care while limiting exposure to the novel coronavirus.    Consent was obtained for video visit:  Yes.   Answered questions that patient had about telehealth interaction:  Yes.   I discussed the limitations, risks, security and privacy concerns of performing an evaluation and management service by telemedicine. I also discussed with the patient that there may be a patient responsible charge related to this service. The patient expressed understanding and agreed to proceed.  Pt location: Home Physician Location: office Name of referring provider:  Hulan Fess, MD I connected with [name] at patients initiation/request on 03/02/2019 at 10:00 AM EDT by video enabled telemedicine application and verified that I am speaking with the correct person using two identifiers. Pt MRN:  [id number] Pt DOB:  11-28-1940 Video Participants:  [name]   History of Present Illness: This is a 78 y.o. male returning for follow-up of seropositive myasthenia gravis.  He continues to have right ptosis, which remains unchanged.  He does not have any double vision, difficulty swallowing, or talking.  No limb weakness.  He remains on prednisone 10 mg daily and Mestinon 30 mg 3 times daily.   Observations/Objective:   Vitals:   03/02/19 0948  Weight: 187 lb (84.8 kg)  Height: 6' (1.829 m)   Patient is awake, alert, and appears comfortable.  Oriented x 4.   Extraocular muscles are intact.  Moderate right ptosis at baseline and worse with sustained upgaze.  Face is symmetric.  Speech is not dysarthric. Tongue is midline. Antigravity in all extremities.  No pronator drift. He is able to stand up with arms crossed.  Gait appears normal and stable, unassisted..   Assessment and Plan:  Seropositive myasthenia gravis with exacerbation (diagnosed August 2019).  He continues to have right ptosis.  No other bulbar or limb  weakness.  I will keep him on prednisone 10 mg daily throughout the summer months and consider tapering in the fall.  Continue Mestinon 30 mg at 8 AM, 1 PM, and 6 PM.patient was informed to contact my office if he develops new weakness.  He has noticed pseudo-exacerbation as that temperature is are warming up.  I did counsel him on warmer temperatures causing mild exacerbation of symptoms and the importance to stay in shaded areas and keep ice water when outside.   Follow Up Instructions:   I discussed the assessment and treatment plan with the patient. The patient was provided an opportunity to ask questions and all were answered. The patient agreed with the plan and demonstrated an understanding of the instructions.   The patient was advised to call back or seek an in-person evaluation if the symptoms worsen or if the condition fails to improve as anticipated.  Follow-up in 3 months  Total time spent:  25 minutes     Alda Berthold, DO

## 2019-03-10 DIAGNOSIS — H2513 Age-related nuclear cataract, bilateral: Secondary | ICD-10-CM | POA: Diagnosis not present

## 2019-03-10 DIAGNOSIS — H43811 Vitreous degeneration, right eye: Secondary | ICD-10-CM | POA: Diagnosis not present

## 2019-03-10 DIAGNOSIS — H35413 Lattice degeneration of retina, bilateral: Secondary | ICD-10-CM | POA: Diagnosis not present

## 2019-03-12 ENCOUNTER — Other Ambulatory Visit: Payer: Self-pay

## 2019-03-12 ENCOUNTER — Ambulatory Visit (INDEPENDENT_AMBULATORY_CARE_PROVIDER_SITE_OTHER): Payer: Medicare Other | Admitting: Internal Medicine

## 2019-03-12 ENCOUNTER — Encounter: Payer: Self-pay | Admitting: Internal Medicine

## 2019-03-12 VITALS — BP 124/74 | HR 65 | Temp 98.0°F | Ht 72.0 in | Wt 199.0 lb

## 2019-03-12 DIAGNOSIS — R05 Cough: Secondary | ICD-10-CM

## 2019-03-12 DIAGNOSIS — J449 Chronic obstructive pulmonary disease, unspecified: Secondary | ICD-10-CM

## 2019-03-12 DIAGNOSIS — R911 Solitary pulmonary nodule: Secondary | ICD-10-CM

## 2019-03-12 DIAGNOSIS — R058 Other specified cough: Secondary | ICD-10-CM

## 2019-03-12 MED ORDER — PANTOPRAZOLE SODIUM 40 MG PO TBEC: 40.0000 mg | DELAYED_RELEASE_TABLET | Freq: Every day | ORAL | 2 refills | Status: DC

## 2019-03-12 MED ORDER — FAMOTIDINE 20 MG PO TABS: ORAL_TABLET | ORAL | 11 refills | Status: DC

## 2019-03-12 NOTE — Progress Notes (Signed)
[name], male    DOB: December 03, 1940,    MRN: [id number]   Brief patient profile:  33 yowm   with problems with seasonal allergy sneezing/coughing/ eyes running all his life spring to fall never with sob or wheeze and quit smoking 2002 with pfts c/w GOLD II copd 2015 but no problems with breathing  until fall of 2019 with dx of MG and after rx breathing improved p started prednisone (floor of 10 mg daily)  but persistent cough so referred to pulmonary clinic 03/12/2019 by Dr   Hulan Fess    History of Present Illness  03/12/2019  Pulmonary/ 1st office eval/  Chief Complaint  Patient presents with  . Pulmonary Consult    Referred by Dr. Rex Kras. Pt c/o cough for a year- worse since Sept 2019. His cough is prod with clear sputum.   Dyspnea:  Not limited by breathing from desired activities at preseent Cough: during the day sporadic / no relationship between cough and pred dose / assoc with swallowing feels like something gets stuck =esp crackers  Sleep: no noct cough at all SABA use: none    No obvious day to day or daytime variability or assoc excess/ purulent sputum or mucus plugs or hemoptysis or cp or chest tightness, subjective wheeze or overt sinus or hb symptoms.   Sleeping flat without nocturnal  or early am exacerbation  of respiratory  c/o's or need for noct saba. Also denies any obvious fluctuation of symptoms with weather or environmental changes or other aggravating or alleviating factors except as outlined above   No unusual exposure hx or h/o childhood pna/ asthma or knowledge of premature birth.  Current Allergies, Complete Past Medical History, Past Surgical History, Family History, and Social History were reviewed in [employer] record.  ROS  The following are not active complaints unless bolded Hoarseness, sore throat, dysphagia with last st eval 07/18/18= severe asp risk , dental problems, itching, sneezing,  nasal congestion or discharge  of excess mucus or purulent secretions, ear ache,   fever, chills, sweats, unintended wt loss or wt gain, classically pleuritic or exertional cp,  orthopnea pnd or arm/hand swelling  or leg swelling, presyncope, palpitations, abdominal pain, anorexia, nausea, vomiting, diarrhea  or change in bowel habits or change in bladder habits, change in stools or change in urine, dysuria, hematuria,  rash, arthralgias, visual complaints, headache, numbness, weakness or ataxia or problems with walking or coordination,  change in mood or  memory.           Past Medical History:  Diagnosis Date  . Abscess of sigmoid colon due to diverticulitis 12/26/2018  . Appendicitis 09/05/2014  . Colon polyp   . Complication of anesthesia    Difficult to arouse, Combative x 1  . Diverticulitis   . Diverticulosis    extensive 3'15.  . Dysrhythmia    hx. Ablation for tachycardia, no routine cardiology visits now  . ED (erectile dysfunction)   . Heart murmur    teenager  . History of colon polyps   . History of kidney stones    x1 passed  . Hyperlipidemia   . Myasthenia gravis ([place])   . 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 Osteoarthritis   . Palpitation    with svt rate 150  . 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 Tendinitis    achiles heel spur  . Testicular  atrophy   . Tubular adenoma of colon     Outpatient Medications Prior to Visit  Medication Sig Dispense Refill  . aspirin 81 MG tablet Take 1 tablet (81 mg total) by mouth daily.    Marland Kitchen atorvastatin (LIPITOR) 10 MG tablet Take 10 mg by mouth daily.    . Calcium Carbonate-Vitamin D (CALCIUM 600+D) 600-400 MG-UNIT per tablet Take 1 tablet by mouth daily.      . Cyanocobalamin (VITAMIN B-12 PO) Take 5,000 mcg by mouth daily.     Marland Kitchen doxazosin (CARDURA) 8 MG tablet Take 8 mg by mouth daily.    . finasteride (PROSCAR) 5 MG tablet Take 5 mg by mouth every morning.   2  . fluticasone  (FLONASE) 50 MCG/ACT nasal spray Place 2 sprays into both nostrils daily.    . Glucosamine HCl 1500 MG TABS Take 1,500 mg by mouth daily.    . Multiple Vitamin (MULTIVITAMIN WITH MINERALS) TABS tablet Take 1 tablet by mouth daily.    . predniSONE (DELTASONE) 10 MG tablet Reduce prednisone to alternating days of 15mg  and 10mg  for two weeks, then reduce to 10mg  daily.  Stay on predinsone 10mg  daily. (Patient taking differently: Take 10 mg by mouth daily with breakfast. ) 90 tablet 1  . pyridostigmine (MESTINON) 60 MG tablet Take 0.5 tablets (30 mg total) by mouth 3 (three) times daily. Take half tablet at 8am, 1pm, and 6pm (Patient taking differently: Take 30 mg by mouth 3 (three) times daily. Take 30 mg tablet at 8am, 1pm, and 6pm) 135 tablet 3  . sodium chloride (OCEAN) 0.65 % SOLN nasal spray Place 1 spray into both nostrils as needed for congestion.    Marland Kitchen atorvastatin (LIPITOR) 10 MG tablet Take 1 tablet (10 mg total) by mouth daily at 6 PM. 30 tablet 5  . feeding supplement, ENSURE ENLIVE, (ENSURE ENLIVE) LIQD Take 237 mLs by mouth 3 (three) times daily between meals. (Patient taking differently: Take 237 mLs by mouth daily. ) 60 Bottle 60  . ibuprofen (ADVIL,MOTRIN) 200 MG tablet Take 200 mg by mouth every 6 (six) hours as needed for headache or moderate pain.     . traMADol (ULTRAM) 50 MG tablet Take 1-2 tablets (50-100 mg total) by mouth every 6 (six) hours as needed for moderate pain or severe pain (mild pain). 20 tablet 0   No facility-administered medications prior to visit.      Objective:     BP 124/74 (BP Location: Left Arm, Cuff Size: Normal)   Pulse 65   Temp 98 F (36.7 C) (Oral)   Ht 6' (1.829 m)   Wt 230 lb (104.3 kg)   SpO2 98%   BMI 31.19 kg/m   SpO2: 98 %  slt hoarse wm nad   HEENT: nl dentition, turbinates bilaterally, and oropharynx. Nl external ear canals without cough reflex   NECK :  without JVD/Nodes/TM/ nl carotid upstrokes bilaterally   LUNGS: no acc  muscle use,  Nl contour chest which is clear to A and P bilaterally without cough on insp or exp maneuvers   CV:  RRR  no s3 or murmur or increase in P2, and no edema   ABD:  soft and nontender with nl inspiratory excursion in the supine position. No bruits or organomegaly appreciated, bowel sounds nl  MS:  Nl gait/ ext warm without deformities, calf tenderness, cyanosis or clubbing No obvious joint restrictions   SKIN: warm and dry without lesions    NEURO:  alert,  approp, nl sensorium with  no motor or cerebellar deficits apparent.      I personally reviewed images and agree with radiology impression as follows:   Chest CT 01/09/19 1. Unchanged 8 mm ground-glass opacity in the right apex. Repeat CT is recommended every 2 years until 5 years of stability has been Established    Assessment   Upper airway cough syndrome Onset fall 2019 with dx of MG - See ST eval 07/18/18 c/w severe asp risk - max rx for gerd rec 03/12/2019   The most common causes of chronic cough in immunocompetent adults include the following: upper airway cough syndrome (UACS), previously referred to as postnasal drip syndrome (PNDS), which is caused by variety of rhinosinus conditions; (2) asthma; (3) GERD; (4) chronic bronchitis from cigarette smoking or other inhaled environmental irritants; (5) nonasthmatic eosinophilic bronchitis; and (6) bronchiectasis.  Allergic mechs for cough less likely as no better on pred in various doses.   These conditions, singly or in combination, have accounted for up to 94% of the causes of chronic cough in prospective studies.   Other conditions have constituted no >6% of the causes in prospective studies These have included bronchogenic carcinoma, chronic interstitial pneumonia, sarcoidosis, left ventricular failure, ACEI-induced cough, and aspiration from a condition associated with pharyngeal dysfunction.    Chronic cough is often simultaneously caused by more than one  condition. A single cause has been found from 38 to 82% of the time, multiple causes from 18 to 62%. Multiply caused cough has been the result of three diseases up to 42% of the time.    His hx is most c/w MG assoc with  Upper airway cough syndrome (previously labeled PNDS),  is so named because it's frequently impossible to sort out how much is  CR/sinusitis with freq throat clearing (which can be related to primary GERD)   vs  causing  secondary (" extra esophageal")  GERD from wide swings in gastric pressure that occur with throat clearing, often  promoting self use of mint and menthol lozenges that reduce the lower esophageal sphincter tone and exacerbate the problem further in a cyclical fashion.   These are the same pts (now being labeled as having "irritable larynx syndrome" by some cough centers) who not infrequently have a history of having failed to tolerate ace inhibitors,  dry powder inhalers or biphosphonates or report having atypical/extraesophageal reflux symptoms that don't respond to standard doses of PPI  and are easily confused as having aecopd or asthma flares by even experienced allergists/ pulmonologists (myself included).    >>> rec first try max rx for gerd then consider redo ST eval   Discussed in detail all the  indications, usual  risks and alternatives  relative to the benefits with patient/wife(over the phone) who agree  to proceed with w/u as outlined.           COPD GOLD II - Quit smoking 2002 -  Spirometry 10/26/14  FEV1  2.49   (70%)  ratio 59     I reviewed the Fletcher curve with the patient that basically indicates  if you quit smoking when your best day FEV1 is still well preserved (as is relatively the case here)  it is highly unlikely you will progress to severe disease and informed the patient there was  no medication on the market that has proven to alter the curve/ its downward trajectory  or the likelihood of progression of their disease(unlike other  chronic medical conditions such  as atheroclerosis where we do think we can change the natural hx with risk reducing meds)    Therefore stopping smoking and maintaining abstinence are  the most important aspects of care, not choice of inhalers or for that matter, doctors.   Treatment other than smoking cessation  is entirely directed by severity of symptoms and focused also on reducing exacerbations, not attempting to change the natural history of the disease.     >>> since he has no symptoms (albeit on prednisone for MG they may be otherwise suppressed ) or tendency to aecopd I don't rec rx for now.     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  > CT chest 10/27/2014 > spn resolved - See CT  01/09/19   8 mm GG RUL > rec repeat in 2 years   CT results reviewed with pt >>> Too small for PET or bx, not suspicious enough for excisional bx > really only option for now is follow the Fleischner society guidelines as rec by radiology > will place in our reminder file to close the loop 12/2020  Discussed in detail all the  indications, usual  risks and alternatives  relative to the benefits with patient who agrees to proceed with conservative f/u as outlined      Total time devoted to counseling  > 50 % of initial 60 min office visit:  review case with pt/wife discussion of options/alternatives/ personally creating written customized instructions  in presence of pt  then going over those specific  Instructions directly with the pt including how to use all of the meds but in particular covering each new medication in detail and the difference between the maintenance= "automatic" meds and the prns using an action plan format for the latter (If this problem/symptom => do that organization reading Left to right).  Please see AVS from this visit for a full list of these instructions which I personally wrote for this pt and  are unique to this visit.     Christinia Gully, MD 03/12/2019

## 2019-03-12 NOTE — Assessment & Plan Note (Signed)
-   Quit smoking 2002 -  Spirometry 10/26/14  FEV1  2.49   (70%)  ratio 59     I reviewed the Fletcher curve with the patient that basically indicates  if you quit smoking when your best day FEV1 is still well preserved (as is relatively the case here)  it is highly unlikely you will progress to severe disease and informed the patient there was  no medication on the market that has proven to alter the curve/ its downward trajectory  or the likelihood of progression of their disease(unlike other chronic medical conditions such as atheroclerosis where we do think we can change the natural hx with risk reducing meds)    Therefore stopping smoking and maintaining abstinence are  the most important aspects of care, not choice of inhalers or for that matter, doctors.   Treatment other than smoking cessation  is entirely directed by severity of symptoms and focused also on reducing exacerbations, not attempting to change the natural history of the disease.     >>> since he has no symptoms (albeit on prednisone for MG they may be otherwise suppressed ) or tendency to aecopd I don't rec rx for now.

## 2019-03-12 NOTE — Assessment & Plan Note (Addendum)
Onset fall 2019 with dx of MG - See ST eval 07/18/18 c/w severe asp risk - max rx for gerd rec 03/12/2019   The most common causes of chronic cough in immunocompetent adults include the following: upper airway cough syndrome (UACS), previously referred to as postnasal drip syndrome (PNDS), which is caused by variety of rhinosinus conditions; (2) asthma; (3) GERD; (4) chronic bronchitis from cigarette smoking or other inhaled environmental irritants; (5) nonasthmatic eosinophilic bronchitis; and (6) bronchiectasis. Allergic mechs for cough less likely as no better on pred in various doses   These conditions, singly or in combination, have accounted for up to 94% of the causes of chronic cough in prospective studies.   Other conditions have constituted no >6% of the causes in prospective studies These have included bronchogenic carcinoma, chronic interstitial pneumonia, sarcoidosis, left ventricular failure, ACEI-induced cough, and aspiration from a condition associated with pharyngeal dysfunction.    Chronic cough is often simultaneously caused by more than one condition. A single cause has been found from 38 to 82% of the time, multiple causes from 18 to 62%. Multiply caused cough has been the result of three diseases up to 42% of the time.    His hx is most c/w MG assoc with  Upper airway cough syndrome (previously labeled PNDS),  is so named because it's frequently impossible to sort out how much is  CR/sinusitis with freq throat clearing (which can be related to primary GERD)   vs  causing  secondary (" extra esophageal")  GERD from wide swings in gastric pressure that occur with throat clearing, often  promoting self use of mint and menthol lozenges that reduce the lower esophageal sphincter tone and exacerbate the problem further in a cyclical fashion.   These are the same pts (now being labeled as having "irritable larynx syndrome" by some cough centers) who not infrequently have a history of  having failed to tolerate ace inhibitors,  dry powder inhalers or biphosphonates or report having atypical/extraesophageal reflux symptoms that don't respond to standard doses of PPI  and are easily confused as having aecopd or asthma flares by even experienced allergists/ pulmonologists (myself included).    >>> rec first try max rx for gerd then consider redo ST eval    Discussed in detail all the  indications, usual  risks and alternatives  relative to the benefits with patient/wife(over the phone) who agree  to proceed with w/u as outlined.

## 2019-03-12 NOTE — Patient Instructions (Addendum)
Pantoprazole (protonix) 40 mg   Take  30-60 min before first meal of the day and Pepcid (famotidine)  20 mg one right after supper automatically until return to office - this is the best way to tell whether stomach acid is contributing to your problem.     GERD (REFLUX)  is an extremely common cause of respiratory symptoms just like yours , many times with no obvious heartburn at all.    It can be treated with medication, but also with lifestyle changes including elevation of the head of your bed (ideally with 6 -8inch blocks under the headboard of your bed),  Smoking cessation, avoidance of late meals, excessive alcohol, and avoid fatty foods, chocolate, peppermint, colas, red wine, and acidic juices such as orange juice.  NO MINT OR MENTHOL PRODUCTS SO NO COUGH DROPS  USE SUGARLESS CANDY INSTEAD (Jolley ranchers or Stover's or Life Savers) or even ice chips will also do - the key is to swallow to prevent all throat clearing. NO OIL BASED VITAMINS - use powdered substitutes.  Avoid fish oil when coughing.    Please schedule a follow up office visit in 6 weeks, call sooner if needed

## 2019-03-12 NOTE — Assessment & Plan Note (Signed)
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  > CT chest 10/27/2014 > spn resolved - See CT  01/09/19   8 mm GG RUL > rec repeat in 2 years   CT results reviewed with pt >>> Too small for PET or bx, not suspicious enough for excisional bx > really only option for now is follow the Fleischner society guidelines as rec by radiology > will place in our reminder file to close the loop 12/2020  Discussed in detail all the  indications, usual  risks and alternatives  relative to the benefits with patient who agrees to proceed with conservative f/u as outlined      Total time devoted to counseling  > 50 % of initial 60 min office visit:  review case with pt/wife discussion of options/alternatives/ personally creating written customized instructions  in presence of pt  then going over those specific  Instructions directly with the pt including how to use all of the meds but in particular covering each new medication in detail and the difference between the maintenance= "automatic" meds and the prns using an action plan format for the latter (If this problem/symptom => do that organization reading Left to right).  Please see AVS from this visit for a full list of these instructions which I personally wrote for this pt and  are unique to this visit.

## 2019-03-13 ENCOUNTER — Telehealth: Payer: Self-pay | Admitting: Internal Medicine

## 2019-03-13 NOTE — Telephone Encounter (Signed)
Dr. [name],  Would these PPI's interfere with medications that you have prescribed for pt?

## 2019-03-13 NOTE — Telephone Encounter (Signed)
Spoke with patient. He wanted to know why MW had prescribed Pepcid and Protonix. Explained to him that it was common for MW to prescribe these medications to help eliminate any type of cough that a patient has. He verbalized understanding.   While on the phone, patient also wanted to know why he was advised to stop taking Ensure.   MW, please advise on the Ensure. Thanks!

## 2019-03-14 NOTE — Telephone Encounter (Signed)
Re reflux rx, the instruction on the avs make it clear why I rec them  "GERD (REFLUX)  is an extremely common cause of respiratory symptoms just like yours , many times with no obvious heartburn at all. "  Everything I wanted him to do is on the avs,  I don't recall commenting on the ensure and don't see it in the instructions but don't have any problem with him taking it.

## 2019-03-16 NOTE — Telephone Encounter (Signed)
Called and spoke with patient today regarding MW recommendations below Read the AVS in full to the patient twice clear and slowly Pt expressed and verbalized understanding Nothing further needed.

## 2019-04-13 DIAGNOSIS — H2511 Age-related nuclear cataract, right eye: Secondary | ICD-10-CM | POA: Diagnosis not present

## 2019-04-17 ENCOUNTER — Encounter: Payer: Self-pay | Admitting: Podiatry

## 2019-04-17 ENCOUNTER — Other Ambulatory Visit: Payer: Self-pay

## 2019-04-17 ENCOUNTER — Ambulatory Visit (INDEPENDENT_AMBULATORY_CARE_PROVIDER_SITE_OTHER): Payer: Medicare Other | Admitting: Podiatry

## 2019-04-17 ENCOUNTER — Other Ambulatory Visit: Payer: Self-pay | Admitting: Podiatry

## 2019-04-17 ENCOUNTER — Ambulatory Visit (INDEPENDENT_AMBULATORY_CARE_PROVIDER_SITE_OTHER): Payer: Medicare Other

## 2019-04-17 VITALS — Temp 97.9°F

## 2019-04-17 DIAGNOSIS — M79671 Pain in right foot: Secondary | ICD-10-CM

## 2019-04-17 DIAGNOSIS — M19071 Primary osteoarthritis, right ankle and foot: Secondary | ICD-10-CM | POA: Diagnosis not present

## 2019-04-17 DIAGNOSIS — D3613 Benign neoplasm of peripheral nerves and autonomic nervous system of lower limb, including hip: Secondary | ICD-10-CM

## 2019-04-17 DIAGNOSIS — M19072 Primary osteoarthritis, left ankle and foot: Secondary | ICD-10-CM | POA: Diagnosis not present

## 2019-04-17 DIAGNOSIS — G5763 Lesion of plantar nerve, bilateral lower limbs: Secondary | ICD-10-CM

## 2019-04-17 DIAGNOSIS — G5762 Lesion of plantar nerve, left lower limb: Secondary | ICD-10-CM

## 2019-04-17 DIAGNOSIS — M79672 Pain in left foot: Secondary | ICD-10-CM | POA: Diagnosis not present

## 2019-04-17 DIAGNOSIS — M722 Plantar fascial fibromatosis: Secondary | ICD-10-CM

## 2019-04-17 DIAGNOSIS — M19079 Primary osteoarthritis, unspecified ankle and foot: Secondary | ICD-10-CM

## 2019-04-20 ENCOUNTER — Telehealth: Payer: Self-pay | Admitting: Podiatry

## 2019-04-20 NOTE — Telephone Encounter (Signed)
This is an error.

## 2019-04-20 NOTE — Telephone Encounter (Signed)
Pt saw Dr. March Rummage on Friday and wants to know why his AVS states he needs to stop taking the doxazosin 8 mg tablet (cardura).

## 2019-04-21 ENCOUNTER — Encounter: Payer: Self-pay | Admitting: Podiatry

## 2019-04-22 ENCOUNTER — Other Ambulatory Visit: Payer: Self-pay

## 2019-04-22 ENCOUNTER — Encounter: Payer: Self-pay | Admitting: Internal Medicine

## 2019-04-22 ENCOUNTER — Ambulatory Visit (INDEPENDENT_AMBULATORY_CARE_PROVIDER_SITE_OTHER): Payer: Medicare Other

## 2019-04-22 ENCOUNTER — Ambulatory Visit (INDEPENDENT_AMBULATORY_CARE_PROVIDER_SITE_OTHER): Payer: Medicare Other | Admitting: Internal Medicine

## 2019-04-22 DIAGNOSIS — J449 Chronic obstructive pulmonary disease, unspecified: Secondary | ICD-10-CM

## 2019-04-22 DIAGNOSIS — R058 Other specified cough: Secondary | ICD-10-CM

## 2019-04-22 DIAGNOSIS — R911 Solitary pulmonary nodule: Secondary | ICD-10-CM

## 2019-04-22 DIAGNOSIS — R05 Cough: Secondary | ICD-10-CM | POA: Diagnosis not present

## 2019-04-22 LAB — CBC WITH DIFFERENTIAL/PLATELET
Basophils Absolute: 0 10*3/uL (ref 0.0–0.1)
Basophils Relative: 0.4 % (ref 0.0–3.0)
Eosinophils Absolute: 0 10*3/uL (ref 0.0–0.7)
Eosinophils Relative: 0.5 % (ref 0.0–5.0)
HCT: 44.4 % (ref 39.0–52.0)
Hemoglobin: 14.9 g/dL (ref 13.0–17.0)
Lymphocytes Relative: 10.6 % — ABNORMAL LOW (ref 12.0–46.0)
Lymphs Abs: 0.7 10*3/uL (ref 0.7–4.0)
MCHC: 33.5 g/dL (ref 30.0–36.0)
MCV: 87.2 fl (ref 78.0–100.0)
Monocytes Absolute: 0.3 10*3/uL (ref 0.1–1.0)
Monocytes Relative: 3.8 % (ref 3.0–12.0)
Neutro Abs: 5.7 10*3/uL (ref 1.4–7.7)
Neutrophils Relative %: 84.7 % — ABNORMAL HIGH (ref 43.0–77.0)
Platelets: 229 10*3/uL (ref 150.0–400.0)
RBC: 5.1 Mil/uL (ref 4.22–5.81)
RDW: 14.1 % (ref 11.5–15.5)
WBC: 6.8 10*3/uL (ref 4.0–10.5)

## 2019-04-22 NOTE — Patient Instructions (Addendum)
Bed blocks x 6-8 inchest / keep the candy handy   Add zyrtec 10 mg daily in am (instead of your "allergy pill" unless it is already zyrtec)  Use fluticasone one twice aimed toward your ear on the same side   Please remember to go to the lab and x-ray department   for your tests - we will call you with the results when they are available.       Please schedule a follow up office visit in 6 weeks, call sooner if needed with all medications /inhalers/ solutions in hand so we can verify exactly what you are taking. This includes all medications from all doctors and over the counters  - consider CT sinus/hrct chest next if not responding to rx

## 2019-04-22 NOTE — Progress Notes (Signed)
[name], male    DOB: 1941-01-29,    MRN: [id number]   Brief patient profile:  20 yowm  Quit smoking 2002  with problems with seasonal allergy sneezing/coughing/ eyes running all his life spring to fall never with sob or wheeze  with pfts c/w GOLD II copd 2015 but no problems with breathing  until fall of 2019 with dx of MG and after rx breathing improved p started prednisone (floor of 10 mg daily)  but persistent cough so referred to pulmonary clinic 03/12/2019 by Dr   Hulan Fess    History of Present Illness  03/12/2019  Pulmonary/ 1st office eval/[name]  maint on 10 mg daily and fluticasone  Chief Complaint  Patient presents with  . Pulmonary Consult    Referred by Dr. Rex Kras. Pt c/o cough for a year- worse since Sept 2019. His cough is prod with clear sputum.   Dyspnea:  Not limited by breathing from desired activities at present Cough: during the day sporadic / no relationship between cough and pred dose / assoc with swallowing feels like something gets stuck =esp crackers  Sleep: no noct cough at all SABA use: none  rec Pantoprazole (protonix) 40 mg   Take  30-60 min before first meal of the day and Pepcid (famotidine)  20 mg one right after supper automatically until return to office - this is the best way to tell whether stomach acid is contributing to your problem.   GERD  Diet    04/22/2019  f/u ov/[name] re: uacs no better  Onset sept 2019 / stil on prednisone for MG at 10 mg  Chief Complaint  Patient presents with  . Follow-up    Cough has not improved. No new co's.  Dyspnea:  Not limited by breathing from desired activities   Cough: daily but never wakes him up / min white mucus  Sleeping: bed is flat couple pillows  SABA use: none  02: none  Swallowing water helps    No obvious day to day or daytime variability or assoc excess/ purulent sputum or mucus plugs or hemoptysis or cp or chest tightness, subjective wheeze or overt sinus or hb symptoms.   Sleeping as  above without nocturnal  or early am exacerbation  of respiratory  c/o's or need for noct saba. Also denies any obvious fluctuation of symptoms with weather or environmental changes or other aggravating or alleviating factors except as outlined above   No unusual exposure hx or h/o childhood pna/ asthma or knowledge of premature birth.  Current Allergies, Complete Past Medical History, Past Surgical History, Family History, and Social History were reviewed in [employer] record.  ROS  The following are not active complaints unless bolded Hoarseness, sore throat, dysphagia, dental problems, itching, sneezing,  nasal congestion or discharge of excess mucus or purulent secretions, ear ache,   fever, chills, sweats, unintended wt loss or wt gain, classically pleuritic or exertional cp,  orthopnea pnd or arm/hand swelling  or leg swelling, presyncope, palpitations, abdominal pain, anorexia, nausea, vomiting, diarrhea  or change in bowel habits or change in bladder habits, change in stools or change in urine, dysuria, hematuria,  rash, arthralgias, visual complaints, headache, numbness, weakness or ataxia or problems with walking or coordination,  change in mood or  memory.        Current Meds - - NOTE:   Unable to verify as accurately reflecting what pt takes   says "I have a list" but did not  include his "allergy pill"   Medication Sig  . aspirin 81 MG tablet Take 1 tablet (81 mg total) by mouth daily.  Marland Kitchen atorvastatin (LIPITOR) 10 MG tablet Take 10 mg by mouth daily.  . Calcium Carbonate-Vitamin D (CALCIUM 600+D) 600-400 MG-UNIT per tablet Take 1 tablet by mouth daily.    . Cyanocobalamin (VITAMIN B-12 PO) Take 5,000 mcg by mouth daily.   . famotidine (PEPCID) 20 MG tablet One right after supper  . finasteride (PROSCAR) 5 MG tablet Take 5 mg by mouth every morning.   . fluticasone (FLONASE) 50 MCG/ACT nasal spray Place 2 sprays into both nostrils daily.  . Glucosamine HCl 1500  MG TABS Take 1,500 mg by mouth daily.  . Multiple Vitamin (MULTIVITAMIN WITH MINERALS) TABS tablet Take 1 tablet by mouth daily.  . pantoprazole (PROTONIX) 40 MG tablet Take 1 tablet (40 mg total) by mouth daily. Take 30-60 min before first meal of the day  . predniSONE (DELTASONE) 10 MG tablet Reduce prednisone to alternating days of 15mg  and 10mg  for two weeks, then reduce to 10mg  daily.  Stay on predinsone 10mg  daily. (Patient taking differently: Take 10 mg by mouth daily with breakfast. )  . pyridostigmine (MESTINON) 60 MG tablet Take 0.5 tablets (30 mg total) by mouth 3 (three) times daily. Take half tablet at 8am, 1pm, and 6pm (Patient taking differently: Take 30 mg by mouth 3 (three) times daily. Take 30 mg tablet at 8am, 1pm, and 6pm)  . sodium chloride (OCEAN) 0.65 % SOLN nasal spray Place 1 spray into both nostrils as needed for congestion.                          Past Medical History:  Diagnosis Date  . Abscess of sigmoid colon due to diverticulitis 12/26/2018  . Appendicitis 09/05/2014  . Colon polyp   . Complication of anesthesia    Difficult to arouse, Combative x 1  . Diverticulitis   . Diverticulosis    extensive 3'15.  . Dysrhythmia    hx. Ablation for tachycardia, no routine cardiology visits now  . ED (erectile dysfunction)   . Heart murmur    teenager  . History of colon polyps   . History of kidney stones    x1 passed  . Hyperlipidemia   . 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 Neuromuscular disorder ([place])    "neuroma"   . Osteoarthritis   . Palpitation    with svt rate 150  . 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 Tendinitis    achiles heel spur  . Testicular atrophy   . Tubular adenoma of colon        Objective:     Wt Readings from Last 3 Encounters:  04/22/19 188 lb (85.3 kg)  03/12/19 199 lb (90.3 kg)  03/02/19 187 lb (84.8 kg)     Vital signs reviewed - Note on arrival 02  sats  93% on RA        pleasant wm hoarse wm nad no cough on insp/exp   HEENT: nl dentition, turbinates bilaterally, and oropharynx. Nl external ear canals without cough reflex   NECK :  without JVD/Nodes/TM/ nl carotid upstrokes bilaterally   LUNGS: no acc muscle use,  Nl contour chest which is clear to A and P bilaterally without cough on insp or exp maneuvers   CV:  RRR  no s3 or  murmur or increase in P2, and no edema   ABD:  soft and nontender with nl inspiratory excursion in the supine position. No bruits or organomegaly appreciated, bowel sounds nl  MS:  Nl gait/ ext warm without deformities, calf tenderness, cyanosis or clubbing No obvious joint restrictions   SKIN: warm and dry without lesions    NEURO:  alert, approp, nl sensorium with  no motor or cerebellar deficits apparent.    CXR PA and Lateral:   04/22/2019 :    I personally reviewed images and agree with radiology impression as follows:   Chronic lung changes without acute findings.    Labs ordered 04/22/2019    Allergy profile   Assessment

## 2019-04-22 NOTE — Telephone Encounter (Signed)
Called pt to let him know it was an error and to disregard.

## 2019-04-23 ENCOUNTER — Telehealth: Payer: Self-pay | Admitting: Internal Medicine

## 2019-04-23 ENCOUNTER — Encounter: Payer: Self-pay | Admitting: Internal Medicine

## 2019-04-23 LAB — RESPIRATORY ALLERGY PROFILE REGION II ~~LOC~~
Allergen, A. alternata, m6: <0.1 kU/L
Allergen, Cedar tree, t12: <0.1 kU/L
Allergen, Comm Silver Birch, t9: <0.1 kU/L
Allergen, Cottonwood, t14: <0.1 kU/L
Allergen, D pternoyssinus,d7: <0.1 kU/L
Allergen, Mouse Urine Protein, e78: <0.1 kU/L
Allergen, Mulberry, t76: <0.1 kU/L
Allergen, Oak,t7: <0.1 kU/L
Allergen, P. notatum, m1: <0.1 kU/L
Aspergillus fumigatus, m3: <0.1 kU/L
Bermuda Grass: <0.1 kU/L
Box Elder IgE: <0.1 kU/L
CLADOSPORIUM HERBARUM (M2) IGE: <0.1 kU/L
COMMON RAGWEED (SHORT) (W1) IGE: 0.26 kU/L — ABNORMAL HIGH
Cat Dander: <0.1 kU/L
Class: 0
Class: 0
Class: 0
Class: 0
Class: 0
Class: 0
Class: 0
Class: 0
Class: 0
Class: 0
Class: 0
Class: 0
Class: 0
Class: 0
Class: 0
Class: 0
Class: 0
Class: 0
Class: 0
Class: 0
Class: 0
Class: 0
Class: 0
Class: 0
Cockroach: <0.1 kU/L
D. farinae: <0.1 kU/L
Dog Dander: <0.1 kU/L
Elm IgE: <0.1 kU/L
IgE (Immunoglobulin E), Serum: 12 kU/L (ref <=114)
Johnson Grass: <0.1 kU/L
Pecan/Hickory Tree IgE: <0.1 kU/L
Rough Pigweed  IgE: <0.1 kU/L
Sheep Sorrel IgE: <0.1 kU/L
Timothy Grass: 0.33 kU/L — ABNORMAL HIGH

## 2019-04-23 LAB — INTERPRETATION:

## 2019-04-23 NOTE — Assessment & Plan Note (Signed)
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

## 2019-04-23 NOTE — Assessment & Plan Note (Addendum)
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  The best way to treat this is actually 1 of the cheapest, namely over-the-counter first generation antihistamines along with evaluation for allergies and optimal treatment with nasal steroids and if not effective add astelin or change to atrovent for vasomotor rhinitis.  I spent extra time today reviewing topical application of Flonase for best delivery to the target tissue.   If not improving on this regimen the neck step is an HRCT of the chest and sinus CT.   >>> f/u in 6 weeks with all meds in hand using a trust but verify approach to confirm accurate Medication  Reconciliation The principal here is that until we are certain that the  patients are doing what we've asked, it makes no sense to ask them to do more.    I had an extended discussion with the patient reviewing all relevant studies completed to date and  lasting 15 to 20 minutes of a 25 minute visit      device teaching (nasal steroids) extended face to face time for this visit.  Each maintenance medication was reviewed in detail including emphasizing most importantly the difference between maintenance and prns and under what circumstances the prns are to be triggered using an action plan format that is not reflected in the computer generated alphabetically organized AVS which I have not found useful in most complex  patients, especially with respiratory illnesses  Please see AVS for specific instructions unique to this visit that I personally wrote and verbalized to the the pt in detail and then reviewed with pt  by my nurse highlighting any  changes in therapy recommended at today's visit to their plan of care.

## 2019-04-23 NOTE — Telephone Encounter (Signed)
Documented on allergy lab profile Allergy to grass/ragweed.. Nothing further needed.

## 2019-04-23 NOTE — Assessment & Plan Note (Signed)
Quit smoking 2002 -  Spirometry 10/26/14  FEV1  2.49   (70%)  ratio 59   No clinical evidence suggest that his cough is asthmatic bronchitis or COPD related.

## 2019-04-27 DIAGNOSIS — H2512 Age-related nuclear cataract, left eye: Secondary | ICD-10-CM | POA: Diagnosis not present

## 2019-04-27 NOTE — Progress Notes (Signed)
Subjective:  Patient ID: [name], male    DOB: 1941/03/15,  MRN: [id number]  Chief Complaint  Patient presents with  . Foot Pain    Bilateral feet "all over" - patient states feet feel tight and stiff for years intermittently, has seen several docs-tried meds, ice, shots-no help  . New Patient (Initial Visit)    78 y.o. male presents with the above complaint. Hx as above.  Review of Systems: Negative except as noted in the HPI. Denies N/V/F/Ch.  Past Medical History:  Diagnosis Date  . Abscess of sigmoid colon due to diverticulitis 12/26/2018  . Appendicitis 09/05/2014  . Colon polyp   . Complication of anesthesia    Difficult to arouse, Combative x 1  . Diverticulitis   . Diverticulosis    extensive 3'15.  . Dysrhythmia    hx. Ablation for tachycardia, no routine cardiology visits now  . ED (erectile dysfunction)   . Heart murmur    teenager  . History of colon polyps   . History of kidney stones    x1 passed  . Hyperlipidemia   . 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 Neuromuscular disorder ([place])    "neuroma"   . Osteoarthritis   . Palpitation    with svt rate 150  . 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 Tendinitis    achiles heel spur  . Testicular atrophy   . Tubular adenoma of colon     Current Outpatient Medications:  .  aspirin 81 MG tablet, Take 1 tablet (81 mg total) by mouth daily., Disp: , Rfl:  .  atorvastatin (LIPITOR) 10 MG tablet, Take 10 mg by mouth daily., Disp: , Rfl:  .  Calcium Carbonate-Vitamin D (CALCIUM 600+D) 600-400 MG-UNIT per tablet, Take 1 tablet by mouth daily.  , Disp: , Rfl:  .  Cyanocobalamin (VITAMIN B-12 PO), Take 5,000 mcg by mouth daily. , Disp: , Rfl:  .  famotidine (PEPCID) 20 MG tablet, One right after supper, Disp: 30 tablet, Rfl: 11 .  finasteride (PROSCAR) 5 MG tablet, Take 5 mg by mouth every morning. , Disp: , Rfl: 2 .  fluticasone (FLONASE) 50 MCG/ACT  nasal spray, Place 2 sprays into both nostrils daily., Disp: , Rfl:  .  Glucosamine HCl 1500 MG TABS, Take 1,500 mg by mouth daily., Disp: , Rfl:  .  Multiple Vitamin (MULTIVITAMIN WITH MINERALS) TABS tablet, Take 1 tablet by mouth daily., Disp: , Rfl:  .  pantoprazole (PROTONIX) 40 MG tablet, Take 1 tablet (40 mg total) by mouth daily. Take 30-60 min before first meal of the day, Disp: 30 tablet, Rfl: 2 .  predniSONE (DELTASONE) 10 MG tablet, Reduce prednisone to alternating days of 15mg  and 10mg  for two weeks, then reduce to 10mg  daily.  Stay on predinsone 10mg  daily. (Patient taking differently: Take 10 mg by mouth daily with breakfast. ), Disp: 90 tablet, Rfl: 1 .  pyridostigmine (MESTINON) 60 MG tablet, Take 0.5 tablets (30 mg total) by mouth 3 (three) times daily. Take half tablet at 8am, 1pm, and 6pm (Patient taking differently: Take 30 mg by mouth 3 (three) times daily. Take 30 mg tablet at 8am, 1pm, and 6pm), Disp: 135 tablet, Rfl: 3 .  sodium chloride (OCEAN) 0.65 % SOLN nasal spray, Place 1 spray into both nostrils as needed for congestion., Disp: , Rfl:   Social History   Tobacco Use  Smoking Status Former [name]  . Packs/day:  1.00  . Years: 47.00  . Pack years: 47.00  . Types: Cigarettes  . Quit date: 10/29/2000  . Years since quitting: 18.5  Smokeless Tobacco Never Used    Allergies  Allergen Reactions  . Pollen Extract Other (See Comments)    Nasal congestion   Objective:   Vitals:   04/17/19 1459  Temp: 97.9 F (36.6 C)   There is no height or weight on file to calculate BMI. Constitutional Well developed. Well nourished.  Vascular Dorsalis pedis pulses palpable bilaterally. Posterior tibial pulses palpable bilaterally. Capillary refill normal to all digits.  No cyanosis or clubbing noted. Pedal hair growth normal.  Neurologic Normal speech. Oriented to person, place, and time. Epicritic sensation to light touch grossly present bilaterally.  Dermatologic  Nails well groomed and normal in appearance. No open wounds. No skin lesions.  Orthopedic: POP bilat 3rd MPJ with mulder's click, POP dorsal midfoot bilat.   Radiographs: Taken and reviewed no acute fractures midfoot DJD Assessment:   1. Morton's metatarsalgia, neuralgia, or neuroma, bilateral   2. Arthritis of midfoot   3. Pain in both feet    Plan:  Patient was evaluated and treated and all questions answered.  Morton Neuroma Bilat, Midfoot OA bilat -XR reviewed -Educated on etiology of above -Injections as below  Procedure: Neuroma Injection Location: Bilateral 3rd interspace Skin Prep: Alcohol. Injectate: 0.5 cc 0.5% marcaine plain, 0.5 cc dexamethasone phosphate. Disposition: Patient tolerated procedure well. Injection site dressed with a band-aid.  Procedure: Joint Injection Location: Bilateral dorsal 2nd TMT joint Skin Prep: Alcohol. Injectate: 0.5 cc 1% lidocaine plain, 0.5 cc dexamethasone phosphate. Disposition: Patient tolerated procedure well. Injection site dressed with a band-aid.  Return in about 6 weeks (around 05/29/2019) for Neuroma and midfoot arthritis f/u after injections.

## 2019-05-04 DIAGNOSIS — H2512 Age-related nuclear cataract, left eye: Secondary | ICD-10-CM | POA: Diagnosis not present

## 2019-05-28 ENCOUNTER — Ambulatory Visit (INDEPENDENT_AMBULATORY_CARE_PROVIDER_SITE_OTHER): Payer: Medicare Other | Admitting: Podiatry

## 2019-05-28 ENCOUNTER — Other Ambulatory Visit: Payer: Self-pay

## 2019-05-28 VITALS — Temp 97.8°F

## 2019-05-28 DIAGNOSIS — M25571 Pain in right ankle and joints of right foot: Secondary | ICD-10-CM

## 2019-05-28 DIAGNOSIS — M19079 Primary osteoarthritis, unspecified ankle and foot: Secondary | ICD-10-CM | POA: Diagnosis not present

## 2019-05-28 DIAGNOSIS — G5763 Lesion of plantar nerve, bilateral lower limbs: Secondary | ICD-10-CM | POA: Diagnosis not present

## 2019-05-28 DIAGNOSIS — M25572 Pain in left ankle and joints of left foot: Secondary | ICD-10-CM | POA: Diagnosis not present

## 2019-05-28 NOTE — Progress Notes (Signed)
Subjective:  Patient ID: [name], male    DOB: 28-Jan-1941,  MRN: [id number]  Chief Complaint  Patient presents with  . Foot Pain    foot pain bilateral -6 weeks follow up - tops of feet feel great -ankles and between the toes are not any better    78 y.o. male presents with the above complaint.  Still having pain in the ankle area. Hx as above.  Review of Systems: Negative except as noted in the HPI. Denies N/V/F/Ch.  Past Medical History:  Diagnosis Date  . Abscess of sigmoid colon due to diverticulitis 12/26/2018  . Appendicitis 09/05/2014  . Colon polyp   . Complication of anesthesia    Difficult to arouse, Combative x 1  . Diverticulitis   . Diverticulosis    extensive 3'15.  . Dysrhythmia    hx. Ablation for tachycardia, no routine cardiology visits now  . ED (erectile dysfunction)   . Heart murmur    teenager  . History of colon polyps   . History of kidney stones    x1 passed  . Hyperlipidemia   . 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 Neuromuscular disorder ([place])    "neuroma"   . Osteoarthritis   . Palpitation    with svt rate 150  . 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 Tendinitis    achiles heel spur  . Testicular atrophy   . Tubular adenoma of colon     Current Outpatient Medications:  .  aspirin 81 MG tablet, Take 1 tablet (81 mg total) by mouth daily., Disp: , Rfl:  .  atorvastatin (LIPITOR) 10 MG tablet, Take 10 mg by mouth daily., Disp: , Rfl:  .  Calcium Carbonate-Vitamin D (CALCIUM 600+D) 600-400 MG-UNIT per tablet, Take 1 tablet by mouth daily.  , Disp: , Rfl:  .  Cyanocobalamin (VITAMIN B-12 PO), Take 5,000 mcg by mouth daily. , Disp: , Rfl:  .  famotidine (PEPCID) 20 MG tablet, One right after supper, Disp: 30 tablet, Rfl: 11 .  finasteride (PROSCAR) 5 MG tablet, Take 5 mg by mouth every morning. , Disp: , Rfl: 2 .  fluticasone (FLONASE) 50 MCG/ACT nasal spray, Place 2 sprays into  both nostrils daily., Disp: , Rfl:  .  Glucosamine HCl 1500 MG TABS, Take 1,500 mg by mouth daily., Disp: , Rfl:  .  Multiple Vitamin (MULTIVITAMIN WITH MINERALS) TABS tablet, Take 1 tablet by mouth daily., Disp: , Rfl:  .  pantoprazole (PROTONIX) 40 MG tablet, Take 1 tablet (40 mg total) by mouth daily. Take 30-60 min before first meal of the day, Disp: 30 tablet, Rfl: 2 .  predniSONE (DELTASONE) 10 MG tablet, Reduce prednisone to alternating days of 15mg  and 10mg  for two weeks, then reduce to 10mg  daily.  Stay on predinsone 10mg  daily. (Patient taking differently: Take 10 mg by mouth daily with breakfast. ), Disp: 90 tablet, Rfl: 1 .  pyridostigmine (MESTINON) 60 MG tablet, Take 0.5 tablets (30 mg total) by mouth 3 (three) times daily. Take half tablet at 8am, 1pm, and 6pm (Patient taking differently: Take 30 mg by mouth 3 (three) times daily. Take 30 mg tablet at 8am, 1pm, and 6pm), Disp: 135 tablet, Rfl: 3 .  sodium chloride (OCEAN) 0.65 % SOLN nasal spray, Place 1 spray into both nostrils as needed for congestion., Disp: , Rfl:   Social History   Tobacco Use  Smoking Status Former Smoker  .  Packs/day: 1.00  . Years: 47.00  . Pack years: 47.00  . Types: Cigarettes  . Quit date: 10/29/2000  . Years since quitting: 18.5  Smokeless Tobacco Never Used    Allergies  Allergen Reactions  . Pollen Extract Other (See Comments)    Nasal congestion   Objective:   Vitals:   05/28/19 1017  Temp: 97.8 F (36.6 C)   There is no height or weight on file to calculate BMI. Constitutional Well developed. Well nourished.  Vascular Dorsalis pedis pulses palpable bilaterally. Posterior tibial pulses palpable bilaterally. Capillary refill normal to all digits.  No cyanosis or clubbing noted. Pedal hair growth normal.  Neurologic Normal speech. Oriented to person, place, and time. Epicritic sensation to light touch grossly present bilaterally.  Dermatologic Nails well groomed and normal in  appearance. No open wounds. No skin lesions.  Orthopedic: POP bilat 3rd MPJ with mulder's click Pain palpation about the sinus tarsi bilateral   Radiographs: None today Assessment:   1. 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  Morton Neuroma Bilat,  -Injection set #2 delivered as below to the neuromas.   Midfoot arthritis -Greatly improved with the injection no repeat injection today  Procedure: Neuroma Injection Location: Bilateral 3rd interspace Skin Prep: Alcohol. Injectate: 0.5 cc 0.5% marcaine plain, 0.5 cc dexamethasone phosphate. Disposition: Patient tolerated procedure well. Injection site dressed with a band-aid.  Sinus tarsitis, Bilateral -Injection delivered to the sinus tarsi as below  Procedure: Injection Intermediate Joint Consent: Verbal consent obtained. Location: Bilateral sinus tarsi. Skin Prep: alcohol. Injectate: 1 cc 0.5% marcaine plain, 1 cc dexamethasone phosphate, 0.5 cc kenalog 10. Disposition: Patient tolerated procedure well. Injection site dressed with a band-aid.   No follow-ups on file.

## 2019-05-30 ENCOUNTER — Other Ambulatory Visit: Payer: Self-pay | Admitting: Neurology

## 2019-06-01 NOTE — Telephone Encounter (Signed)
Requested Prescriptions   Pending Prescriptions Disp Refills  . predniSONE (DELTASONE) 10 MG tablet [Pharmacy Med Name: PREDNISONE 10 MG TABLET] 90 tablet 1    Sig: REDUCE PREDNISONE TO ALTERNATING DAYS OF 15MG  AND 10MG  FOR TWO WEEKS, THEN REDUCE TO 10MG  DAILY. STAY ON PREDINSONE 10MG  DAILY.   Rx last filled:12*30/19 #90 1 REFILLS  Pt last seen:03/02/19  Follow up appt scheduled:06/15/19

## 2019-06-02 ENCOUNTER — Ambulatory Visit (INDEPENDENT_AMBULATORY_CARE_PROVIDER_SITE_OTHER): Payer: Medicare Other | Admitting: Internal Medicine

## 2019-06-02 ENCOUNTER — Other Ambulatory Visit: Payer: Self-pay

## 2019-06-02 ENCOUNTER — Encounter: Payer: Self-pay | Admitting: Internal Medicine

## 2019-06-02 DIAGNOSIS — R05 Cough: Secondary | ICD-10-CM | POA: Diagnosis not present

## 2019-06-02 DIAGNOSIS — J449 Chronic obstructive pulmonary disease, unspecified: Secondary | ICD-10-CM

## 2019-06-02 DIAGNOSIS — R058 Other specified cough: Secondary | ICD-10-CM

## 2019-06-02 MED ORDER — BUDESONIDE-FORMOTEROL FUMARATE 80-4.5 MCG/ACT IN AERO: 2.0000 | INHALATION_SPRAY | Freq: Two times a day (BID) | RESPIRATORY_TRACT | 11 refills | Status: DC

## 2019-06-02 MED ORDER — BUDESONIDE-FORMOTEROL FUMARATE 80-4.5 MCG/ACT IN AERO: 2.0000 | INHALATION_SPRAY | Freq: Two times a day (BID) | RESPIRATORY_TRACT | 0 refills | Status: DC

## 2019-06-02 NOTE — Progress Notes (Signed)
[name], male    DOB: [birth year],    MRN: [id number]   Brief patient profile:  51 yowm  Quit smoking 2002  with problems with seasonal allergy sneezing/coughing/ eyes running all his life spring to fall never with sob or wheeze  with pfts c/w GOLD II copd 2015 but no problems with breathing  until fall of 2019 with dx of MG and after rx breathing improved p started prednisone (floor of 10 mg daily)  but persistent cough (did not notice a dosing relationship)  so referred to pulmonary clinic 03/12/2019 by Dr   Hulan Fess.    History of Present Illness  03/12/2019  Pulmonary/ 1st office eval/Toleen Lachapelle  maint on 10 mg daily and fluticasone  Chief Complaint  Patient presents with   Pulmonary Consult    Referred by Dr. Rex Kras. Pt c/o cough for a year- worse since Sept 2019. His cough is prod with clear sputum.   Dyspnea:  Not limited by breathing from desired activities at present Cough: during the day sporadic / no relationship between cough and pred dose / assoc with swallowing feels like something gets stuck =esp crackers  Sleep: no noct cough at all SABA use: none  rec Pantoprazole (protonix) 40 mg   Take  30-60 min before first meal of the day and Pepcid (famotidine)  20 mg one right after supper automatically until return to office - this is the best way to tell whether stomach acid is contributing to your problem.   GERD  Diet    04/22/2019  f/u ov/Vaughan Garfinkle re: uacs no better  Onset sept 2019 / still on prednisone for MG at 10 mg  Chief Complaint  Patient presents with   Follow-up    Cough has not improved. No new co's.  Dyspnea:  Not limited by breathing from desired activities   Cough: daily but never wakes him up / min white mucus  Sleeping: bed is flat couple pillows  SABA use: none  02: none  Swallowing water helps  rec Bed blocks x 6-8 inches  / keep the candy handy  Add zyrtec 10 mg daily in am (instead of your "allergy pill" unless it is already zyrtec) Use  fluticasone one twice aimed toward your ear on the same side  Please schedule a follow up office visit in 6 weeks, call sooner if needed with all medications /inhalers/ solutions in hand so we can verify exactly what you are taking. This includes all medications from all doctors and over the counters  - consider CT sinus/hrct chest next if not responding to rx    06/02/2019  f/u ov/Mariame Rybolt re: cough x years, worse x summer 2019 / no better on high dose pred Chief Complaint  Patient presents with   Follow-up    Cough is unchanged.   Dyspnea:  Not limited by breathing from desired activities   Cough: varies but not noct / some worse first thing in am but does not wake him up  Sleeping: ok bed blocks  SABA use: never  02: none  clariton helps the nasal congestion but not the cough    No obvious day to day or daytime variability or assoc excess/ purulent sputum or mucus plugs or hemoptysis or cp or chest tightness, subjective wheeze or overt sinus or hb symptoms.   Sleeping better on bed blocks  without nocturnal  or early am exacerbation  of respiratory  c/o's or need for noct saba. Also denies any obvious fluctuation of  symptoms with weather or environmental changes or other aggravating or alleviating factors except as outlined above   No unusual exposure hx or h/o childhood pna/ asthma or knowledge of premature birth.  Current Allergies, Complete Past Medical History, Past Surgical History, Family History, and Social History were reviewed in [employer] record.  ROS  The following are not active complaints unless bolded Hoarseness, sore throat, dysphagia, dental problems, itching, sneezing,  nasal congestion or discharge of excess mucus or purulent secretions, ear ache,   fever, chills, sweats, unintended wt loss or wt gain, classically pleuritic or exertional cp,  orthopnea pnd or arm/hand swelling  or leg swelling, presyncope, palpitations, abdominal pain, anorexia,  nausea, vomiting, diarrhea  or change in bowel habits or change in bladder habits, change in stools or change in urine, dysuria, hematuria,  rash, arthralgias, visual complaints, headache, numbness, weakness or ataxia or problems with walking or coordination,  change in mood or  memory.        Current Meds  Medication Sig   aspirin 81 MG tablet Take 1 tablet (81 mg total) by mouth daily.   atorvastatin (LIPITOR) 10 MG tablet Take 10 mg by mouth daily.   Cyanocobalamin (VITAMIN B-12 PO) Take 5,000 mcg by mouth daily.    famotidine (PEPCID) 20 MG tablet One right after supper   finasteride (PROSCAR) 5 MG tablet Take 5 mg by mouth every morning.    fluticasone (FLONASE) 50 MCG/ACT nasal spray Place 2 sprays into both nostrils daily.   Glucosamine HCl 1500 MG TABS Take 1,500 mg by mouth daily.   Multiple Vitamin (MULTIVITAMIN WITH MINERALS) TABS tablet Take 1 tablet by mouth daily.   pantoprazole (PROTONIX) 40 MG tablet Take 1 tablet (40 mg total) by mouth daily. Take 30-60 min before first meal of the day   predniSONE (DELTASONE) 10 MG tablet REDUCE PREDNISONE TO ALTERNATING DAYS OF 15MG  AND 10MG  FOR TWO WEEKS, THEN REDUCE TO 10MG  DAILY. STAY ON PREDINSONE 10MG  DAILY. (Patient taking differently: 10 mg daily)   pyridostigmine (MESTINON) 60 MG tablet Take 0.5 tablets (30 mg total) by mouth 3 (three) times daily. Take half tablet at 8am, 1pm, and 6pm (Patient taking differently: Take 30 mg by mouth 3 (three) times daily. Take 30 mg tablet at 8am, 1pm, and 6pm)   sodium chloride (OCEAN) 0.65 % SOLN nasal spray Place 1 spray into both nostrils as needed for congestion.                               Past Medical History:  Diagnosis Date   Abscess of sigmoid colon due to diverticulitis 12/26/2018   Appendicitis 09/05/2014   Colon polyp    Complication of anesthesia    Difficult to arouse, Combative x 1   Diverticulitis    Diverticulosis    extensive 3'15.    Dysrhythmia    hx. 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[place])    Neuromuscular disorder ([place])    "neuroma"    Osteoarthritis    Palpitation    with svt rate 150   SUPRAVENTRICULAR TACHYCARDIA 01/11/2011   Qualifier: Diagnosis of  By: [name], MD, [name]    Tendinitis  achiles heel spur   Testicular atrophy    Tubular adenoma of colon        Objective:      06/02/2019         201  04/22/19 188 lb (85.3 kg)  03/12/19 199 lb (90.3 kg)  03/02/19 187 lb (84.8 kg)      amb wm nad somewhat congested sounding cough worse on exp      Vital signs reviewed - Note on arrival 02 sats  95% on RA   HEENT: nl dentition, turbinates bilaterally, and oropharynx. Nl external ear canals without cough reflex   NECK :  without JVD/Nodes/TM/ nl carotid upstrokes bilaterally   LUNGS: no acc muscle use,  Nl contour chest with insp/exp rhonchi  bilaterally with cough on   exp maneuvers   CV:  RRR  no s3 or murmur or increase in P2, and no edema   ABD:  soft and nontender with nl inspiratory excursion in the supine position. No bruits or organomegaly appreciated, bowel sounds nl  MS:  Nl gait/ ext warm without deformities, calf tenderness, cyanosis or clubbing No obvious joint restrictions   SKIN: warm and dry without lesions    NEURO:  alert, approp, nl sensorium with  no motor or cerebellar deficits apparent.               Assessment

## 2019-06-02 NOTE — Patient Instructions (Addendum)
Stop protonix and pepcid to see what different it makes  Symbicort 80 Take 2 puffs first thing in am and then another 2 puffs about 12 hours later.     Work on inhaler technique:  relax and gently blow all the way out then take a nice smooth deep breath back in, triggering the inhaler at same time you start breathing in.  Hold for up to 5 seconds if you can. Blow out thru nose. Rinse and gargle with water when done      GERD (REFLUX)  is an extremely common cause of respiratory symptoms just like yours , many times with no obvious heartburn at all.    It can be treated with medication, but also with lifestyle changes including elevation of the head of your bed (ideally with 6 -8inch blocks under the headboard of your bed),  Smoking cessation, avoidance of late meals, excessive alcohol, and avoid fatty foods, chocolate, peppermint, colas, red wine, and acidic juices such as orange juice.  NO MINT OR MENTHOL PRODUCTS SO NO COUGH DROPS  USE SUGARLESS CANDY INSTEAD (Jolley ranchers or Stover's or Life Savers) or even ice chips will also do - the key is to swallow to prevent all throat clearing. NO OIL BASED VITAMINS - use powdered substitutes.  Avoid fish oil when coughing.    If not better by the time you finish your symbicort call  to schedule sinus and chest CT scans

## 2019-06-03 ENCOUNTER — Encounter: Payer: Self-pay | Admitting: Internal Medicine

## 2019-06-03 NOTE — Assessment & Plan Note (Signed)
Onset in childhold but worse since fall 2019 with dx of MG - See ST eval 07/18/18 c/w severe asp risk - max rx for gerd rec 03/12/2019 > no better 04/22/2019 so added 1st gen H1 blockers per guidelines  - Allergy profile  04/22/2019 >  Eos 0.0 /  IgE  12  RAST pos grass, ragweed  -  06/02/2019 no better on pred /gerd rx so d/c gerd Rx   If not better with rx with symbicort for copd, consider ct chest and sinus next in the w/u    I had an extended discussion with the patient reviewing all relevant studies completed to date and  lasting 15 to 20 minutes of a 25 minute visit    I performed detailed device teaching using a teach back method which extended face to face time for this visit (see above)  Each maintenance medication was reviewed in detail including emphasizing most importantly the difference between maintenance and prns and under what circumstances the prns are to be triggered using an action plan format that is not reflected in the computer generated alphabetically organized AVS which I have not found useful in most complex patients, especially with respiratory illnesses  Please see AVS for specific instructions unique to this visit that I personally wrote and verbalized to the the pt in detail and then reviewed with pt  by my nurse highlighting any  changes in therapy recommended at today's visit to their plan of care.

## 2019-06-03 NOTE — Assessment & Plan Note (Signed)
Quit smoking 2002 -  Spirometry 10/26/14  FEV1  2.49   (70%)  ratio 59    - . 06/02/2019  After extensive coaching inhaler device,  effectiveness =    75% > try symb 80 2bid for refractory cough on expiration typical of AB but surprising in pt already on pred 10 mg daily

## 2019-06-15 ENCOUNTER — Encounter: Payer: Self-pay | Admitting: Neurology

## 2019-06-15 ENCOUNTER — Ambulatory Visit (INDEPENDENT_AMBULATORY_CARE_PROVIDER_SITE_OTHER): Payer: Medicare Other | Admitting: Neurology

## 2019-06-15 ENCOUNTER — Other Ambulatory Visit: Payer: Self-pay

## 2019-06-15 VITALS — BP 118/58 | HR 78 | Ht 72.0 in | Wt 206.0 lb

## 2019-06-15 DIAGNOSIS — G7000 Myasthenia gravis without (acute) exacerbation: Secondary | ICD-10-CM

## 2019-06-15 NOTE — Progress Notes (Signed)
Follow-up Visit   Date: 06/15/19    [name] MRN: [id number] DOB: 1940/11/05   Interim History: [name] is a 78 y.o. right-handed Caucasian male with hyperlipidemia, COPD, peripheral neuropathy, SVT s/p ablation, BPH, former smoker, and recent bowel perforation due to diverticulitis returning to the clinic for follow-up of myasthenia gravis.  The patient was accompanied to the clinic by wife who also provides collateral information.    History of present illness: Patient was diagnosed with myasthenia gravis in August 2019 after presenting with left ptosis, facial weakness, double vision, and dysphagia. He went to the ER for these symptoms where stroke was excluded and AChR antibodies returned positive. He was started on mestinon 30mg  three times daily (8am, 4pm, and midnight).  He was started on prednisone 20mg , with no improvement.  He was hospitalized from 9/19 - 07/23/2018 with MG exacerbated and treated with IVIG with great response.   He also complains of severe neck pain which is worse with prolonged standing and neck extension.  He usually has to sit back down and rest for relief. He does not have numbness/tingling of the hands or weakness.   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  CSF testing was normal and genetic testing was normal.    His prednisone was increased to 30mg  daily and he was started on IVIG, which helped control MG symptoms. During 2020, his prednisone was tapered and he has been on prednisone 10mg  daily.  He was hospitalized in 2019 with diverticulitis and underwent bowel resection in early 2020.  UPDATE 06/15/2019: He is here for follow-up visit.  His myasthenia gravis is very well controlled on prednisone 10 mg daily.  He denies any double vision, difficulty swallowing, difficulty speaking, or limb weakness.  He has mild right ptosis, which has  been unchanged.  Medications:  Current Outpatient Medications on File Prior to Visit  Medication Sig Dispense Refill   aspirin 81 MG tablet Take 1 tablet (81 mg total) by mouth daily.     atorvastatin (LIPITOR) 10 MG tablet Take 10 mg by mouth daily.     budesonide-formoterol (SYMBICORT) 80-4.5 MCG/ACT inhaler Inhale 2 puffs into the lungs 2 (two) times daily. 1 Inhaler 11   Cyanocobalamin (VITAMIN B-12 PO) Take 5,000 mcg by mouth daily.      finasteride (PROSCAR) 5 MG tablet Take 5 mg by mouth every morning.   2   fluticasone (FLONASE) 50 MCG/ACT nasal spray Place 2 sprays into both nostrils daily.     Glucosamine HCl 1500 MG TABS Take 1,500 mg by mouth daily.     Multiple Vitamin (MULTIVITAMIN WITH MINERALS) TABS tablet Take 1 tablet by mouth daily.     predniSONE (DELTASONE) 10 MG tablet REDUCE PREDNISONE TO ALTERNATING DAYS OF 15MG  AND 10MG  FOR TWO WEEKS, THEN REDUCE TO 10MG  DAILY. STAY ON PREDINSONE 10MG  DAILY. (Patient taking differently: 10 mg daily) 90 tablet 1   pyridostigmine (MESTINON) 60 MG tablet Take 0.5 tablets (30 mg total) by mouth 3 (three) times daily. Take half tablet at 8am, 1pm, and 6pm (Patient taking differently: Take 30 mg by mouth 3 (three) times daily. Take 30 mg tablet at 8am, 1pm, and 6pm) 135 tablet 3   Calcium Carbonate-Vitamin D (CALCIUM 600+D) 600-400 MG-UNIT per tablet Take 1 tablet by mouth daily.       famotidine (PEPCID) 20 MG tablet One right after supper 30 tablet 11  pantoprazole (PROTONIX) 40 MG tablet Take 1 tablet (40 mg total) by mouth daily. Take 30-60 min before first meal of the day 30 tablet 2   sodium chloride (OCEAN) 0.65 % SOLN nasal spray Place 1 spray into both nostrils as needed for congestion.     No current facility-administered medications on file prior to visit.     Allergies:  Allergies  Allergen Reactions   Pollen Extract Other (See Comments)    Nasal congestion    Review of Systems:  CONSTITUTIONAL: No fevers,  chills, night sweats, or weight loss.  EYES: No visual changes or eye pain ENT: No hearing changes.  No history of nose bleeds.   RESPIRATORY: No cough, wheezing and shortness of breath.   CARDIOVASCULAR: Negative for chest pain, and palpitations.   GI: +for abdominal discomfort, blood in stools or black stools.  No recent change in bowel habits.   GU:  No history of incontinence.   MUSCLOSKELETAL: No history of joint pain or swelling.  No myalgias.   SKIN: Negative for lesions, rash, and itching.   ENDOCRINE: Negative for cold or heat intolerance, polydipsia or goiter.   PSYCH:  No depression or anxiety symptoms.   NEURO: As Above.   Vital Signs:  BP (!) 118/58    Pulse 78    Ht 6' (1.829 m)    Wt 206 lb (93.4 kg)    SpO2 95%    BMI 27.94 kg/m    Neurological Exam: MENTAL STATUS including orientation to time, place, person, recent and remote memory, attention span and concentration, language, and fund of knowledge is normal.  Speech is not dysarthric.  CRANIAL NERVES:   Pupils equal round and reactive to light.  Normal conjugate, extra-ocular eye movements in all directions of gaze.  Mild right ptosis, without worsening with sustained upgaze.  Face is symmetric.  Orbicularis oculi, buccinator, and orbicularis oris is 5/5.  Palate elevates symmetrically.  Tongue is midline, strength is 5/5.  MOTOR:  Motor strength is 5/5 in all extremities, without fatigability.  No atrophy, fasciculations or abnormal movements.  No pronator drift.  Tone is normal.    COORDINATION/GAIT:    Gait narrow based and stable. He is able to stand up from low chair without using arms.    Data: Labs 06/15/2018:  AChR binding 1.69*, MUSK neg  MRI brain wo contrast 06/14/2018: Negative for acute infarct. 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  CTA head and neck 06/14/2018: 1. Negative for large vessel occlusion, dissection, or arterial  stenosis in the head or neck. There is minimal atherosclerosis. 2. Positive for generalized arterial tortuosity. Ectatic distal aortic arch and proximal descending aorta. 3. A small 15 mm area of ground-glass opacity in the right upper lobe appears stable since 2015. 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  NCS/EMG of the legs 03/13/2018:  This is an abnormal study. There is electrophysiologic evidence oflength-dependent, moderately-severe, axonal sensory-motor polyneuropathy.  CT chest 07/11/2018:  Negative for thymoma  MRI cervical spine wo contrast 07/14/2018: 1. No acute finding. 2. Disc and facet degeneration causes foraminal impingement that is advanced on the left at c3-4 and on the left at C5-6. 3. Mild right foraminal narrowing at C5-6 and C6-7.   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  He was hospitalized from 9/19 - 9/25 with MG exacerbation and responded well to IVIG.  Since this time, he has been well-controlled on prednisone, maximal dose of  30mg /d and mestinon 30mg  TID. Exam shows mild right ptosis, which has been stable.  No other bulbar or limb weakness.  We discussed slowly tapering his prednisone further, but elected to wait until the fall season. Plan to reduce my 1mg /month He will be continued on prednisone 10 mg daily and mestinon 30mg  TID  (8am, 1pm, and 6pm)  Return to clinic in 3 months  Greater than 50% of this 20 minute visit was spent in counseling, explanation of diagnosis, planning of further management, and coordination of care.   Thank you for allowing me to participate in patient's care.  If I can answer any additional questions, I would be pleased to do so.    Sincerely,    Syreeta Figler K. [name], DO

## 2019-06-15 NOTE — Patient Instructions (Signed)
Continue prednisone 10mg  daily Continue mestinon 30mg  three times daily  Return to clinic in November 2020

## 2019-06-18 ENCOUNTER — Ambulatory Visit: Payer: Medicare Other | Admitting: Podiatry

## 2019-06-18 DIAGNOSIS — R972 Elevated prostate specific antigen [PSA]: Secondary | ICD-10-CM | POA: Diagnosis not present

## 2019-06-19 ENCOUNTER — Ambulatory Visit (INDEPENDENT_AMBULATORY_CARE_PROVIDER_SITE_OTHER): Payer: Medicare Other | Admitting: Podiatry

## 2019-06-19 ENCOUNTER — Ambulatory Visit: Payer: Medicare Other | Admitting: Neurology

## 2019-06-19 ENCOUNTER — Other Ambulatory Visit: Payer: Self-pay

## 2019-06-19 VITALS — Temp 98.1°F

## 2019-06-19 DIAGNOSIS — G5763 Lesion of plantar nerve, bilateral lower limbs: Secondary | ICD-10-CM | POA: Diagnosis not present

## 2019-06-22 DIAGNOSIS — D122 Benign neoplasm of ascending colon: Secondary | ICD-10-CM | POA: Diagnosis not present

## 2019-06-22 DIAGNOSIS — K5730 Diverticulosis of large intestine without perforation or abscess without bleeding: Secondary | ICD-10-CM | POA: Diagnosis not present

## 2019-06-22 DIAGNOSIS — K640 First degree hemorrhoids: Secondary | ICD-10-CM | POA: Diagnosis not present

## 2019-06-22 DIAGNOSIS — D120 Benign neoplasm of cecum: Secondary | ICD-10-CM | POA: Diagnosis not present

## 2019-06-22 DIAGNOSIS — K621 Rectal polyp: Secondary | ICD-10-CM | POA: Diagnosis not present

## 2019-06-22 DIAGNOSIS — K635 Polyp of colon: Secondary | ICD-10-CM | POA: Diagnosis not present

## 2019-06-22 DIAGNOSIS — D123 Benign neoplasm of transverse colon: Secondary | ICD-10-CM | POA: Diagnosis not present

## 2019-06-24 DIAGNOSIS — K635 Polyp of colon: Secondary | ICD-10-CM | POA: Diagnosis not present

## 2019-06-24 DIAGNOSIS — D120 Benign neoplasm of cecum: Secondary | ICD-10-CM | POA: Diagnosis not present

## 2019-06-24 DIAGNOSIS — D122 Benign neoplasm of ascending colon: Secondary | ICD-10-CM | POA: Diagnosis not present

## 2019-06-24 DIAGNOSIS — D123 Benign neoplasm of transverse colon: Secondary | ICD-10-CM | POA: Diagnosis not present

## 2019-06-25 DIAGNOSIS — N210 Calculus in bladder: Secondary | ICD-10-CM | POA: Diagnosis not present

## 2019-06-25 DIAGNOSIS — N401 Enlarged prostate with lower urinary tract symptoms: Secondary | ICD-10-CM | POA: Diagnosis not present

## 2019-06-25 DIAGNOSIS — R351 Nocturia: Secondary | ICD-10-CM | POA: Diagnosis not present

## 2019-06-25 DIAGNOSIS — R972 Elevated prostate specific antigen [PSA]: Secondary | ICD-10-CM | POA: Diagnosis not present

## 2019-06-25 DIAGNOSIS — N281 Cyst of kidney, acquired: Secondary | ICD-10-CM | POA: Diagnosis not present

## 2019-07-10 ENCOUNTER — Other Ambulatory Visit: Payer: Self-pay

## 2019-07-10 ENCOUNTER — Other Ambulatory Visit: Payer: Self-pay | Admitting: Neurology

## 2019-07-10 ENCOUNTER — Ambulatory Visit (INDEPENDENT_AMBULATORY_CARE_PROVIDER_SITE_OTHER): Payer: Medicare Other | Admitting: Podiatry

## 2019-07-10 DIAGNOSIS — M19079 Primary osteoarthritis, unspecified ankle and foot: Secondary | ICD-10-CM

## 2019-07-10 DIAGNOSIS — M25572 Pain in left ankle and joints of left foot: Secondary | ICD-10-CM | POA: Diagnosis not present

## 2019-07-10 DIAGNOSIS — G5763 Lesion of plantar nerve, bilateral lower limbs: Secondary | ICD-10-CM

## 2019-07-10 DIAGNOSIS — M25571 Pain in right ankle and joints of right foot: Secondary | ICD-10-CM | POA: Diagnosis not present

## 2019-07-15 DIAGNOSIS — E785 Hyperlipidemia, unspecified: Secondary | ICD-10-CM | POA: Diagnosis not present

## 2019-07-15 DIAGNOSIS — J449 Chronic obstructive pulmonary disease, unspecified: Secondary | ICD-10-CM | POA: Diagnosis not present

## 2019-07-15 DIAGNOSIS — N400 Enlarged prostate without lower urinary tract symptoms: Secondary | ICD-10-CM | POA: Diagnosis not present

## 2019-07-15 DIAGNOSIS — E782 Mixed hyperlipidemia: Secondary | ICD-10-CM | POA: Diagnosis not present

## 2019-07-15 DIAGNOSIS — N401 Enlarged prostate with lower urinary tract symptoms: Secondary | ICD-10-CM | POA: Diagnosis not present

## 2019-07-16 ENCOUNTER — Encounter: Payer: Self-pay | Admitting: Podiatry

## 2019-07-16 ENCOUNTER — Encounter: Payer: Self-pay | Admitting: *Deleted

## 2019-07-16 ENCOUNTER — Telehealth: Payer: Self-pay | Admitting: Internal Medicine

## 2019-07-16 DIAGNOSIS — R05 Cough: Secondary | ICD-10-CM

## 2019-07-16 DIAGNOSIS — R059 Cough, unspecified: Secondary | ICD-10-CM

## 2019-07-16 NOTE — Telephone Encounter (Signed)
Order placed. Pt is aware he will be contacted to schedule these scans. Nothing further needed.

## 2019-07-16 NOTE — Telephone Encounter (Signed)
hrct chest dx cough r/o bronchiectasis

## 2019-07-16 NOTE — Telephone Encounter (Signed)
Called and spoke to pt. Pt states the Symbicort is not helping and would like to move forward with the Sinus and CT chest scans as recommended by MW at last OV. Sinus scan pended to sign.   Dr. Melvyn Novas please advise what type of CT chest. Thanks.   ----------  Instructions  From 06/02/2019  Stop protonix and pepcid to see what different it makes  Symbicort 80 Take 2 puffs first thing in am and then another 2 puffs about 12 hours later.     Work on inhaler technique:  relax and gently blow all the way out then take a nice smooth deep breath back in, triggering the inhaler at same time you start breathing in.  Hold for up to 5 seconds if you can. Blow out thru nose. Rinse and gargle with water when done      GERD (REFLUX)  is an extremely common cause of respiratory symptoms just like yours , many times with no obvious heartburn at all.    It can be treated with medication, but also with lifestyle changes including elevation of the head of your bed (ideally with 6 -8inch blocks under the headboard of your bed),  Smoking cessation, avoidance of late meals, excessive alcohol, and avoid fatty foods, chocolate, peppermint, colas, red wine, and acidic juices such as orange juice.  NO MINT OR MENTHOL PRODUCTS SO NO COUGH DROPS  USE SUGARLESS CANDY INSTEAD (Jolley ranchers or Stover's or Life Savers) or even ice chips will also do - the key is to swallow to prevent all throat clearing. NO OIL BASED VITAMINS - use powdered substitutes.  Avoid fish oil when coughing.    If not better by the time you finish your symbicort call  to schedule sinus and chest CT scans

## 2019-07-16 NOTE — Telephone Encounter (Signed)
Patient is returning the call [phone].

## 2019-07-23 ENCOUNTER — Telehealth: Payer: Self-pay | Admitting: Podiatry

## 2019-07-23 NOTE — Telephone Encounter (Signed)
Pt was seen on 07/10/19 and was supposed to be scheduled for an MRI but has not heard anything yet. Pt calling to follow up.

## 2019-07-24 ENCOUNTER — Ambulatory Visit
Admission: RE | Admit: 2019-07-24 | Discharge: 2019-07-24 | Disposition: A | Discharge status: Home / Self Care | Payer: Medicare Other | Source: Ambulatory Visit | Attending: Internal Medicine | Admitting: Internal Medicine

## 2019-07-24 ENCOUNTER — Other Ambulatory Visit: Payer: Self-pay

## 2019-07-24 DIAGNOSIS — R059 Cough, unspecified: Secondary | ICD-10-CM

## 2019-07-24 DIAGNOSIS — R05 Cough: Secondary | ICD-10-CM | POA: Diagnosis not present

## 2019-07-24 DIAGNOSIS — J432 Centrilobular emphysema: Secondary | ICD-10-CM | POA: Diagnosis not present

## 2019-07-26 NOTE — Progress Notes (Signed)
Subjective:  Patient ID: [name], male    DOB: 1941/02/28,  MRN: [id number]  Chief Complaint  Patient presents with  . Neuroma    Pt states neuroma is feeling better, still some plantar foot discomfort, and still some ankle stiffness.   78 y.o. male presents with the above complaint.  Still having pain in the ankle area. Hx as above.  Review of Systems: Negative except as noted in the HPI. Denies N/V/F/Ch.  Past Medical History:  Diagnosis Date  . Abscess of sigmoid colon due to diverticulitis 12/26/2018  . Appendicitis 09/05/2014  . Colon polyp   . Complication of anesthesia    Difficult to arouse, Combative x 1  . Diverticulitis   . Diverticulosis    extensive 3'15.  . Dysrhythmia    hx. Ablation for tachycardia, no routine cardiology visits now  . ED (erectile dysfunction)   . Heart murmur    teenager  . History of colon polyps   . History of kidney stones    x1 passed  . Hyperlipidemia   . 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 Neuromuscular disorder ([place])    "neuroma"   . Osteoarthritis   . Palpitation    with svt rate 150  . 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 Tendinitis    achiles heel spur  . Testicular atrophy   . Tubular adenoma of colon     Current Outpatient Medications:  .  aspirin 81 MG tablet, Take 1 tablet (81 mg total) by mouth daily., Disp: , Rfl:  .  atorvastatin (LIPITOR) 10 MG tablet, Take 10 mg by mouth daily., Disp: , Rfl:  .  budesonide-formoterol (SYMBICORT) 80-4.5 MCG/ACT inhaler, Inhale 2 puffs into the lungs 2 (two) times daily., Disp: 1 Inhaler, Rfl: 11 .  Cyanocobalamin (VITAMIN B-12 PO), Take 5,000 mcg by mouth daily. , Disp: , Rfl:  .  finasteride (PROSCAR) 5 MG tablet, Take 5 mg by mouth every morning. , Disp: , Rfl: 2 .  fluticasone (FLONASE) 50 MCG/ACT nasal spray, Place 2 sprays into both nostrils daily., Disp: , Rfl:  .  GAVILYTE-N WITH FLAVOR PACK 420 g solution,  , Disp: , Rfl:  .  Glucosamine HCl 1500 MG TABS, Take 1,500 mg by mouth daily., Disp: , Rfl:  .  Multiple Vitamin (MULTIVITAMIN WITH MINERALS) TABS tablet, Take 1 tablet by mouth daily., Disp: , Rfl:  .  predniSONE (DELTASONE) 10 MG tablet, REDUCE PREDNISONE TO ALTERNATING DAYS OF 15MG  AND 10MG  FOR TWO WEEKS, THEN REDUCE TO 10MG  DAILY. STAY ON PREDINSONE 10MG  DAILY. (Patient taking differently: 10 mg daily), Disp: 90 tablet, Rfl: 1 .  sodium chloride (OCEAN) 0.65 % SOLN nasal spray, Place 1 spray into both nostrils as needed for congestion., Disp: , Rfl:  .  Calcium Carbonate-Vitamin D (CALCIUM 600+D) 600-400 MG-UNIT per tablet, Take 1 tablet by mouth daily.  , Disp: , Rfl:  .  doxazosin (CARDURA) 8 MG tablet, Take 8 mg by mouth daily., Disp: , Rfl:  .  famotidine (PEPCID) 20 MG tablet, One right after supper, Disp: 30 tablet, Rfl: 11 .  pantoprazole (PROTONIX) 40 MG tablet, Take 1 tablet (40 mg total) by mouth daily. Take 30-60 min before first meal of the day, Disp: 30 tablet, Rfl: 2 .  pyridostigmine (MESTINON) 60 MG tablet, TAKE 0.5 TABLETS (30 MG TOTAL) BY MOUTH 3 (THREE) TIMES DAILY. TAKE HALF TABLET AT 8AM, 1PM, AND 6PM, Disp: 135  tablet, Rfl: 3  Social History   Tobacco Use  Smoking Status Former Smoker  . Packs/day: 1.00  . Years: 47.00  . Pack years: 47.00  . Types: Cigarettes  . Quit date: 10/29/2000  . Years since quitting: 18.7  Smokeless Tobacco Never Used    Allergies  Allergen Reactions  . Pollen Extract Other (See Comments)    Nasal congestion   Objective:   Vitals:   06/19/19 0914  Temp: 98.1 F (36.7 C)   There is no height or weight on file to calculate BMI. Constitutional Well developed. Well nourished.  Vascular Dorsalis pedis pulses palpable bilaterally. Posterior tibial pulses palpable bilaterally. Capillary refill normal to all digits.  No cyanosis or clubbing noted. Pedal hair growth normal.  Neurologic Normal speech. Oriented to person, place, and  time. Epicritic sensation to light touch grossly present bilaterally.  Dermatologic Nails well groomed and normal in appearance. No open wounds. No skin lesions.  Orthopedic: POP bilat 3rd MPJ with mulder's click Pain palpation about the sinus tarsi bilateral   Radiographs: None today Assessment:   1. Morton's metatarsalgia, neuralgia, or neuroma, bilateral    Plan:  Patient was evaluated and treated and all questions answered.  Morton Neuroma Bilat,  -Injection set #3  Procedure: Neuroma Injection Location: Bilateral 3rd interspace Skin Prep: Alcohol. Injectate: 0.5 cc 0.5% marcaine plain, 0.5 cc dexamethasone phosphate. Disposition: Patient tolerated procedure well. Injection site dressed with a band-aid.     No follow-ups on file.

## 2019-07-27 ENCOUNTER — Encounter: Payer: Self-pay | Admitting: Podiatry

## 2019-07-27 DIAGNOSIS — Z23 Encounter for immunization: Secondary | ICD-10-CM | POA: Diagnosis not present

## 2019-07-29 ENCOUNTER — Telehealth: Payer: Self-pay | Admitting: *Deleted

## 2019-07-29 DIAGNOSIS — M19079 Primary osteoarthritis, unspecified ankle and foot: Secondary | ICD-10-CM

## 2019-07-29 DIAGNOSIS — M25572 Pain in left ankle and joints of left foot: Secondary | ICD-10-CM

## 2019-07-29 DIAGNOSIS — M25571 Pain in right ankle and joints of right foot: Secondary | ICD-10-CM

## 2019-07-29 DIAGNOSIS — G5763 Lesion of plantar nerve, bilateral lower limbs: Secondary | ICD-10-CM

## 2019-07-29 NOTE — Telephone Encounter (Signed)
Orders to L. Cox, CMA for pre-cert, faxed to [facility]. 

## 2019-07-29 NOTE — Telephone Encounter (Signed)
-----   Message from Evelina Bucy, DPM sent at 07/29/2019  3:50 PM EDT ----- Can we order MRI right ankle: tendonitis, sinus tarsitis.

## 2019-07-29 NOTE — Progress Notes (Signed)
Subjective:  Patient ID: [name], male    DOB: 1941/03/18,  MRN: [id number]  Chief Complaint  Patient presents with  . Ankle Pain    Pt states generalized bilateral ankle pain not resolving with injections, still having lots of pain.  Marland Kitchen Neuroma    Pt states no longer having any pain on dorsal aspects of feet, injections have been helpful.   78 y.o. male presents with the above complaint. Hx as above.  Review of Systems: Negative except as noted in the HPI. Denies N/V/F/Ch.  Past Medical History:  Diagnosis Date  . Abscess of sigmoid colon due to diverticulitis 12/26/2018  . Appendicitis 09/05/2014  . Colon polyp   . Complication of anesthesia    Difficult to arouse, Combative x 1  . Diverticulitis   . Diverticulosis    extensive 3'15.  . Dysrhythmia    hx. Ablation for tachycardia, no routine cardiology visits now  . ED (erectile dysfunction)   . Heart murmur    teenager  . History of colon polyps   . History of kidney stones    x1 passed  . Hyperlipidemia   . Myasthenia gravis ([place])   . Neuromuscular disorder ([place])    "neuroma"   . Osteoarthritis   . Palpitation    with svt rate 150  . 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 Tendinitis    achiles heel spur  . Testicular atrophy   . Tubular adenoma of colon     Current Outpatient Medications:  .  aspirin 81 MG tablet, Take 1 tablet (81 mg total) by mouth daily., Disp: , Rfl:  .  atorvastatin (LIPITOR) 10 MG tablet, Take 10 mg by mouth daily., Disp: , Rfl:  .  budesonide-formoterol (SYMBICORT) 80-4.5 MCG/ACT inhaler, Inhale 2 puffs into the lungs 2 (two) times daily., Disp: 1 Inhaler, Rfl: 11 .  Cyanocobalamin (VITAMIN B-12 PO), Take 5,000 mcg by mouth daily. , Disp: , Rfl:  .  doxazosin (CARDURA) 8 MG tablet, Take 8 mg by mouth daily., Disp: , Rfl:  .  finasteride (PROSCAR) 5 MG tablet, Take 5 mg by mouth every morning. , Disp: , Rfl: 2 .   fluticasone (FLONASE) 50 MCG/ACT nasal spray, Place 2 sprays into both nostrils daily., Disp: , Rfl:  .  Glucosamine HCl 1500 MG TABS, Take 1,500 mg by mouth daily., Disp: , Rfl:  .  Multiple Vitamin (MULTIVITAMIN WITH MINERALS) TABS tablet, Take 1 tablet by mouth daily., Disp: , Rfl:  .  predniSONE (DELTASONE) 10 MG tablet, REDUCE PREDNISONE TO ALTERNATING DAYS OF 15MG  AND 10MG  FOR TWO WEEKS, THEN REDUCE TO 10MG  DAILY. STAY ON PREDINSONE 10MG  DAILY. (Patient taking differently: 10 mg daily), Disp: 90 tablet, Rfl: 1 .  pyridostigmine (MESTINON) 60 MG tablet, TAKE 0.5 TABLETS (30 MG TOTAL) BY MOUTH 3 (THREE) TIMES DAILY. TAKE HALF TABLET AT 8AM, 1PM, AND 6PM, Disp: 135 tablet, Rfl: 3 .  sodium chloride (OCEAN) 0.65 % SOLN nasal spray, Place 1 spray into both nostrils as needed for congestion., Disp: , Rfl:  .  Calcium Carbonate-Vitamin D (CALCIUM 600+D) 600-400 MG-UNIT per tablet, Take 1 tablet by mouth daily.  , Disp: , Rfl:  .  famotidine (PEPCID) 20 MG tablet, One right after supper, Disp: 30 tablet, Rfl: 11 .  GAVILYTE-N WITH FLAVOR PACK 420 g solution, , Disp: , Rfl:  .  pantoprazole (PROTONIX) 40 MG tablet, Take 1 tablet (40 mg total)  by mouth daily. Take 30-60 min before first meal of the day, Disp: 30 tablet, Rfl: 2  Social History   Tobacco Use  Smoking Status Former Smoker  . Packs/day: 1.00  . Years: 47.00  . Pack years: 47.00  . Types: Cigarettes  . Quit date: 10/29/2000  . Years since quitting: 18.7  Smokeless Tobacco Never Used    Allergies  Allergen Reactions  . Pollen Extract Other (See Comments)    Nasal congestion   Objective:   There were no vitals filed for this visit. There is no height or weight on file to calculate BMI. Constitutional Well developed. Well nourished.  Vascular Dorsalis pedis pulses palpable bilaterally. Posterior tibial pulses palpable bilaterally. Capillary refill normal to all digits.  No cyanosis or clubbing noted. Pedal hair growth  normal.  Neurologic Normal speech. Oriented to person, place, and time. Epicritic sensation to light touch grossly present bilaterally.  Dermatologic Nails well groomed and normal in appearance. No open wounds. No skin lesions.  Orthopedic: POP bilat 3rd MPJ with mulder's click Pain palpation about the sinus tarsi bilateral   Radiographs: None today Assessment:   1. Morton's metatarsalgia, neuralgia, or neuroma, bilateral   2. Sinus tarsi syndrome of both ankles   3. Arthritis of midfoot    Plan:  Patient was evaluated and treated and all questions answered.  Morton Neuroma Bilat -Resolved  Tendonitis Right ankle, sinus tarsitis -Recalcitrant to conservative care. -Will rx MRI fo further eval.    No follow-ups on file.

## 2019-08-01 ENCOUNTER — Ambulatory Visit
Admission: RE | Admit: 2019-08-01 | Discharge: 2019-08-01 | Disposition: A | Discharge status: Home / Self Care | Payer: Medicare Other | Source: Ambulatory Visit | Attending: Podiatry | Admitting: Podiatry

## 2019-08-01 DIAGNOSIS — M25572 Pain in left ankle and joints of left foot: Secondary | ICD-10-CM

## 2019-08-01 DIAGNOSIS — M25571 Pain in right ankle and joints of right foot: Secondary | ICD-10-CM

## 2019-08-01 DIAGNOSIS — R600 Localized edema: Secondary | ICD-10-CM | POA: Diagnosis not present

## 2019-08-01 DIAGNOSIS — M19079 Primary osteoarthritis, unspecified ankle and foot: Secondary | ICD-10-CM

## 2019-08-01 DIAGNOSIS — G5763 Lesion of plantar nerve, bilateral lower limbs: Secondary | ICD-10-CM

## 2019-08-04 ENCOUNTER — Telehealth: Payer: Self-pay | Admitting: *Deleted

## 2019-08-04 NOTE — Telephone Encounter (Signed)
The patient has Medicare as the primary and BCBS Medicare Supplement is secondary and does not require prior authorization. [name]

## 2019-08-06 ENCOUNTER — Ambulatory Visit (INDEPENDENT_AMBULATORY_CARE_PROVIDER_SITE_OTHER): Payer: Medicare Other | Admitting: Podiatry

## 2019-08-06 ENCOUNTER — Other Ambulatory Visit: Payer: Self-pay

## 2019-08-06 DIAGNOSIS — M25371 Other instability, right ankle: Secondary | ICD-10-CM

## 2019-08-06 DIAGNOSIS — M7671 Peroneal tendinitis, right leg: Secondary | ICD-10-CM

## 2019-08-06 DIAGNOSIS — M958 Other specified acquired deformities of musculoskeletal system: Secondary | ICD-10-CM

## 2019-08-06 MED ORDER — METHYLPREDNISOLONE 4 MG PO TBPK: ORAL_TABLET | ORAL | 0 refills | Status: DC

## 2019-08-11 DIAGNOSIS — D049 Carcinoma in situ of skin, unspecified: Secondary | ICD-10-CM | POA: Diagnosis not present

## 2019-08-11 DIAGNOSIS — L570 Actinic keratosis: Secondary | ICD-10-CM | POA: Diagnosis not present

## 2019-08-11 DIAGNOSIS — D229 Melanocytic nevi, unspecified: Secondary | ICD-10-CM | POA: Diagnosis not present

## 2019-08-12 ENCOUNTER — Encounter: Payer: Self-pay | Admitting: Internal Medicine

## 2019-08-12 ENCOUNTER — Other Ambulatory Visit: Payer: Self-pay

## 2019-08-12 ENCOUNTER — Ambulatory Visit (INDEPENDENT_AMBULATORY_CARE_PROVIDER_SITE_OTHER): Payer: Medicare Other | Admitting: Internal Medicine

## 2019-08-12 DIAGNOSIS — R05 Cough: Secondary | ICD-10-CM

## 2019-08-12 DIAGNOSIS — R911 Solitary pulmonary nodule: Secondary | ICD-10-CM

## 2019-08-12 DIAGNOSIS — R058 Other specified cough: Secondary | ICD-10-CM

## 2019-08-12 DIAGNOSIS — J449 Chronic obstructive pulmonary disease, unspecified: Secondary | ICD-10-CM

## 2019-08-12 NOTE — Assessment & Plan Note (Signed)
Onset in childhold but worse since fall 2019 with dx of MG - See ST eval 07/18/18 c/w severe asp risk - max rx for gerd rec 03/12/2019 > no better 04/22/2019 so added 1st gen H1 blockers per guidelines  - Allergy profile  04/22/2019 >  Eos 0.0 /  IgE  12  RAST pos grass, ragweed  -  06/02/2019 no better on pred /gerd rx so d/c gerd Rx  - Sinus CT 07/24/2019 Negative for paranasal sinus disease. - HRCT chest 07/24/2019 1. No evidence of interstitial lung disease. Chronic mild findings of postinfectious/postinflammatory scarring at the lung bases. 2. Mild centrilobular and paraseptal emphysema with mild diffuse bronchial wall thickening, suggesting COPD.   Upper airway cough syndrome (previously labeled PNDS),  is so named because it's frequently impossible to sort out how much is  CR/sinusitis with freq throat clearing (which can be related to primary GERD)   vs  causing  secondary (" extra esophageal")  GERD from wide swings in gastric pressure that occur with throat clearing, often  promoting self use of mint and menthol lozenges that reduce the lower esophageal sphincter tone and exacerbate the problem further in a cyclical fashion.   These are the same pts (now being labeled as having "irritable larynx syndrome" by some cough centers) who not infrequently have a history of having failed to tolerate ace inhibitors,  dry powder inhalers or biphosphonates or report having atypical/extraesophageal reflux symptoms that don't respond to standard doses of PPI  and are easily confused as having aecopd or asthma flares by even experienced allergists/ pulmonologists (myself included).   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

## 2019-08-12 NOTE — Progress Notes (Signed)
[name], male    DOB: 03/03/1941,    MRN: [id number]   Brief patient profile:  42 yowm  Quit smoking 2002  with problems with seasonal allergy sneezing/coughing/ eyes running all his life spring to fall never with sob or wheeze  with pfts c/w GOLD II copd 2015 but no problems with breathing  until fall of 2019 with dx of MG and after rx breathing improved p started prednisone (floor of 10 mg daily)  but persistent cough (did not notice a dosing relationship)  so referred to pulmonary clinic 03/12/2019 by Dr   Hulan Fess.    History of Present Illness  03/12/2019  Pulmonary/ 1st office eval/Mihaela Fajardo  maint on 10 mg daily and fluticasone  Chief Complaint  Patient presents with  . Pulmonary Consult    Referred by [name]. Pt c/o cough for a year- worse since Sept 2019. His cough is prod with clear sputum.   Dyspnea:  Not limited by breathing from desired activities at present Cough: during the day sporadic / no relationship between cough and pred dose / assoc with swallowing feels like something gets stuck =esp crackers  Sleep: no noct cough at all SABA use: none  rec Pantoprazole (protonix) 40 mg   Take  30-60 min before first meal of the day and Pepcid (famotidine)  20 mg one right after supper automatically until return to office - this is the best way to tell whether stomach acid is contributing to your problem.   GERD  Diet    04/22/2019  f/u ov/Shiza Thelen re: uacs no better  Onset sept 2019 / still on prednisone for MG at 10 mg  Chief Complaint  Patient presents with  . Follow-up    Cough has not improved. No new co's.  Dyspnea:  Not limited by breathing from desired activities   Cough: daily but never wakes him up / min white mucus  Sleeping: bed is flat couple pillows  SABA use: none  02: none  Swallowing water helps  rec Bed blocks x 6-8 inches  / keep the candy handy  Add zyrtec 10 mg daily in am (instead of your "allergy pill" unless it is already zyrtec) Use  fluticasone one twice aimed toward your ear on the same side  Please schedule a follow up office visit in 6 weeks, call sooner if needed with all medications /inhalers/ solutions in hand so we can verify exactly what you are taking. This includes all medications from all doctors and over the counters  - consider CT sinus/hrct chest next if not responding to rx    06/02/2019  f/u ov/Briea Mcenery re: cough x years, worse x summer 2019 / no better on high dose pred Chief Complaint  Patient presents with  . Follow-up    Cough is unchanged.   Dyspnea:  Not limited by breathing from desired activities   Cough: varies but not noct / some worse first thing in am but does not wake him up  Sleeping: ok no bed blocks SABA use: never  02: none  clariton helps the nasal congestion but not the cough  rec Stop protonix and pepcid to see what different it makes Symbicort 80 Take 2 puffs first thing in am and then another 2 puffs about 12 hours later.  Work on inhaler technique:  GERD If not better by the time you finish your symbicort call  to schedule sinus and chest CT scans>  No explanation for cough    08/12/2019  f/u ov/Ashtyn Freilich re:  Chronic cough worse x summer 2019 = throat tickle / no response prednisone/ no change in any symptoms on or off symbicort 80  2bid  Chief Complaint  Patient presents with  . Follow-up    Cough is unchanged. No new co's.   Dyspnea:   Not limited by breathing from desired activities   Cough: sporadic daytime about a half tsp twice daily assoc with hoarseness  Sleeping: two pillows no bed blocks  SABA use: none/ no resp to symbicort  02: no  No obvious day to day or daytime variability or assoc   purulent sputum or mucus plugs or hemoptysis or cp or chest tightness, subjective wheeze or overt sinus or hb symptoms.   Sleeping  without nocturnal  or early am exacerbation  of respiratory  c/o's or need for noct saba. Also denies any obvious fluctuation of symptoms with weather or  environmental changes or other aggravating or alleviating factors except as outlined above   No unusual exposure hx or h/o childhood pna/ asthma or knowledge of premature birth.  Current Allergies, Complete Past Medical History, Past Surgical History, Family History, and Social History were reviewed in [employer] record.  ROS  The following are not active complaints unless bolded Hoarseness, sore throat, dysphagia, dental problems, itching, sneezing,  nasal congestion or discharge of excess mucus or purulent secretions, ear ache,   fever, chills, sweats, unintended wt loss or wt gain, classically pleuritic or exertional cp,  orthopnea pnd or arm/hand swelling  or leg swelling, presyncope, palpitations, abdominal pain, anorexia, nausea, vomiting, diarrhea  or change in bowel habits or change in bladder habits, change in stools or change in urine, dysuria, hematuria,  rash, arthralgias, visual complaints, headache, numbness, weakness or ataxia or problems with walking or coordination,  change in mood or  memory.        Current Meds  Medication Sig  . aspirin 81 MG tablet Take 1 tablet (81 mg total) by mouth daily.  Marland Kitchen atorvastatin (LIPITOR) 10 MG tablet Take 10 mg by mouth daily.  . Cyanocobalamin (VITAMIN B-12 PO) Take 5,000 mcg by mouth daily.   Marland Kitchen doxazosin (CARDURA) 8 MG tablet Take 8 mg by mouth daily.  . finasteride (PROSCAR) 5 MG tablet Take 5 mg by mouth every morning.   . fluticasone (FLONASE) 50 MCG/ACT nasal spray Place 2 sprays into both nostrils daily.  . Glucosamine HCl 1500 MG TABS Take 1,500 mg by mouth daily.  . methylPREDNISolone (MEDROL DOSEPAK) 4 MG TBPK tablet 6 Day Taper Pack. Take as Directed.  . Multiple Vitamin (MULTIVITAMIN WITH MINERALS) TABS tablet Take 1 tablet by mouth daily.  . predniSONE (DELTASONE) 10 MG tablet REDUCE PREDNISONE TO ALTERNATING DAYS OF 15MG  AND 10MG  FOR TWO WEEKS, THEN REDUCE TO 10MG  DAILY. STAY ON PREDINSONE 10MG  DAILY.  (Patient taking differently: 10 mg daily)  . pyridostigmine (MESTINON) 60 MG tablet TAKE 0.5 TABLETS (30 MG TOTAL) BY MOUTH 3 (THREE) TIMES DAILY. TAKE HALF TABLET AT 8AM, 1PM, AND 6PM  . sodium chloride (OCEAN) 0.65 % SOLN nasal spray Place 1 spray into both nostrils as needed for congestion.            Past Medical History:  Diagnosis Date  . Abscess of sigmoid colon due to diverticulitis 12/26/2018  . Appendicitis 09/05/2014  . Colon polyp   . Complication of anesthesia    Difficult to arouse, Combative x 1  . Diverticulitis   . Diverticulosis  extensive 3'15.  . Dysrhythmia    hx. Ablation for tachycardia, no routine cardiology visits now  . ED (erectile dysfunction)   . Heart murmur    teenager  . History of colon polyps   . History of kidney stones    x1 passed  . Hyperlipidemia   . 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 Neuromuscular disorder ([place])    "neuroma"   . Osteoarthritis   . Palpitation    with svt rate 150  . 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 Tendinitis    achiles heel spur  . Testicular atrophy   . Tubular adenoma of colon        Objective:     08/12/2019      205   06/02/2019         201  04/22/19 188 lb (85.3 kg)  03/12/19 199 lb (90.3 kg)  03/02/19 187 lb (84.8 kg)     amb elderly  wm no longer congested sounding cough but def voice fatigue  Vital signs reviewed - Note on arrival 02 sats  96% on RA       HEENT : pt wearing mask not removed for exam due to covid -19 concerns.    NECK :  without JVD/Nodes/TM/ nl carotid upstrokes bilaterally   LUNGS: no acc muscle use,  Nl contour chest which is clear to A and P bilaterally without cough on insp or exp maneuvers   CV:  RRR  no s3 or murmur or increase in P2, and no edema   ABD:  soft and nontender with nl inspiratory excursion in the supine position. No bruits or organomegaly appreciated, bowel sounds nl  MS:  Nl gait/  ext warm without deformities, calf tenderness, cyanosis or clubbing No obvious joint restrictions   SKIN: warm and dry without lesions    NEURO:  alert, approp, nl sensorium with  no motor or cerebellar deficits apparent.         Assessment

## 2019-08-12 NOTE — Assessment & Plan Note (Signed)
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 Follow-up noncontrast chest CT is recommended every 2 years > placed in reminder file for recall 07/23/21    Discussed in detail all the  indications, usual  risks and alternatives  relative to the benefits with patient who agrees to proceed with  f/u as outlined

## 2019-08-12 NOTE — Assessment & Plan Note (Signed)
Quit smoking 2002 -   Spirometry 10/26/14  FEV1  2.49   (70%)  ratio 59  -   06/02/2019  After extensive coaching inhaler device,  effectiveness =    75% > try symb 80 2bid for refractory cough > no change so d/c'd    No evidence at all that his copd has anything to do with his symptoms - really not limited by doe at all and no tendency to aecopd while on prednisone for MG so pulmonary f/u can be prn   I had an extended discussion with the patient reviewing all relevant studies completed to date and  lasting 15 to 20 minutes of a 25 minute final summary f/u office visit    Each maintenance medication was reviewed in detail including most importantly the difference between maintenance and prns and under what circumstances the prns are to be triggered using an action plan format that is not reflected in the computer generated alphabetically organized AVS.     Please see AVS for specific instructions unique to this visit that I personally wrote and verbalized to the the pt in detail and then reviewed with pt  by my nurse highlighting any  changes in therapy recommended at today's visit to their plan of care.

## 2019-08-12 NOTE — Patient Instructions (Addendum)
Take clariton daily for now   We will be referring you to Dr Eliott Nine  [facility] - take your medications with you    Pulmonary follow  Up is as needed

## 2019-09-10 ENCOUNTER — Other Ambulatory Visit: Payer: Self-pay

## 2019-09-10 ENCOUNTER — Ambulatory Visit (INDEPENDENT_AMBULATORY_CARE_PROVIDER_SITE_OTHER): Payer: Medicare Other | Admitting: Podiatry

## 2019-09-10 DIAGNOSIS — G5763 Lesion of plantar nerve, bilateral lower limbs: Secondary | ICD-10-CM | POA: Diagnosis not present

## 2019-09-10 DIAGNOSIS — M25572 Pain in left ankle and joints of left foot: Secondary | ICD-10-CM

## 2019-09-10 DIAGNOSIS — M25571 Pain in right ankle and joints of right foot: Secondary | ICD-10-CM

## 2019-09-10 NOTE — Progress Notes (Signed)
Subjective:  Patient ID: [name], male    DOB: 26-Feb-1941,  MRN: [id number]  Chief Complaint  Patient presents with  . Foot Pain    pt is here for a f/u on neuroma, pt states that he is still in pain from the last time he was here, pt states that his toes are often swollen at night time, pt also states that his pain is aggravated at night time, pt is also here for a f/u on his MRI   78 y.o. male presents with the above complaint. Hx as above. Had to d/c steroid pack due to increased BP.  Review of Systems: Negative except as noted in the HPI. Denies N/V/F/Ch.  Past Medical History:  Diagnosis Date  . Abscess of sigmoid colon due to diverticulitis 12/26/2018  . Appendicitis 09/05/2014  . Colon polyp   . Complication of anesthesia    Difficult to arouse, Combative x 1  . Diverticulitis   . Diverticulosis    extensive 3'15.  . Dysrhythmia    hx. Ablation for tachycardia, no routine cardiology visits now  . ED (erectile dysfunction)   . Heart murmur    teenager  . History of colon polyps   . History of kidney stones    x1 passed  . Hyperlipidemia   . Myasthenia gravis ([place])   . Neuromuscular disorder ([place])    "neuroma"   . Osteoarthritis   . Palpitation    with svt rate 150  . 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 Tendinitis    achiles heel spur  . Testicular atrophy   . Tubular adenoma of colon     Current Outpatient Medications:  .  aspirin 81 MG tablet, Take 1 tablet (81 mg total) by mouth daily., Disp: , Rfl:  .  atorvastatin (LIPITOR) 10 MG tablet, Take 10 mg by mouth daily., Disp: , Rfl:  .  Cyanocobalamin (VITAMIN B-12 PO), Take 5,000 mcg by mouth daily. , Disp: , Rfl:  .  doxazosin (CARDURA) 8 MG tablet, Take 8 mg by mouth daily., Disp: , Rfl:  .  finasteride (PROSCAR) 5 MG tablet, Take 5 mg by mouth every morning. , Disp: , Rfl: 2 .  fluticasone (FLONASE) 50 MCG/ACT nasal spray, Place 2  sprays into both nostrils daily., Disp: , Rfl:  .  Glucosamine HCl 1500 MG TABS, Take 1,500 mg by mouth daily., Disp: , Rfl:  .  methylPREDNISolone (MEDROL DOSEPAK) 4 MG TBPK tablet, 6 Day Taper Pack. Take as Directed., Disp: 21 tablet, Rfl: 0 .  Multiple Vitamin (MULTIVITAMIN WITH MINERALS) TABS tablet, Take 1 tablet by mouth daily., Disp: , Rfl:  .  predniSONE (DELTASONE) 10 MG tablet, REDUCE PREDNISONE TO ALTERNATING DAYS OF 15MG  AND 10MG  FOR TWO WEEKS, THEN REDUCE TO 10MG  DAILY. STAY ON PREDINSONE 10MG  DAILY. (Patient taking differently: 10 mg daily), Disp: 90 tablet, Rfl: 1 .  pyridostigmine (MESTINON) 60 MG tablet, TAKE 0.5 TABLETS (30 MG TOTAL) BY MOUTH 3 (THREE) TIMES DAILY. TAKE HALF TABLET AT 8AM, 1PM, AND 6PM, Disp: 135 tablet, Rfl: 3 .  sodium chloride (OCEAN) 0.65 % SOLN nasal spray, Place 1 spray into both nostrils as needed for congestion., Disp: , Rfl:   Social History   Tobacco Use  Smoking Status Former Smoker  . Packs/day: 1.00  . Years: 47.00  . Pack years: 47.00  . Types: Cigarettes  . Quit date: 10/29/2000  . Years since quitting: 6.8  Smokeless Tobacco Never Used    Allergies  Allergen Reactions  . Pollen Extract Other (See Comments)    Nasal congestion   Objective:   There were no vitals filed for this visit. There is no height or weight on file to calculate BMI. Constitutional Well developed. Well nourished.  Vascular Dorsalis pedis pulses palpable bilaterally. Posterior tibial pulses palpable bilaterally. Capillary refill normal to all digits.  No cyanosis or clubbing noted. Pedal hair growth normal.  Neurologic Normal speech. Oriented to person, place, and time. Epicritic sensation to light touch grossly present bilaterally.  Dermatologic Nails well groomed and normal in appearance. No open wounds. No skin lesions.  Orthopedic: POP bilat 3rd MPJ with mulder's click Pain palpation about the sinus tarsi bilateral   Radiographs: None today  Assessment:   1. 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  Morton Neuroma Bilat -Repeat injeciton -Consider sclerosing injection at next visit  Procedure: Neuroma Injection Location: Bilateral 3rd interspace Skin Prep: Alcohol. Injectate: 0.5 cc 0.5% marcaine plain, 0.5 cc dexamethasone phosphate. Disposition: Patient tolerated procedure well. Injection site dressed with a band-aid.  Tendonitis Right ankle, sinus tarsitis -Had to d/c steroid pack -Defer treatment at this time.   Return in about 3 weeks (around 10/01/2019) for Neuroma, Bilateral.

## 2019-09-15 DIAGNOSIS — J302 Other seasonal allergic rhinitis: Secondary | ICD-10-CM | POA: Diagnosis not present

## 2019-09-15 DIAGNOSIS — R490 Dysphonia: Secondary | ICD-10-CM | POA: Insufficient documentation

## 2019-09-15 DIAGNOSIS — H903 Sensorineural hearing loss, bilateral: Secondary | ICD-10-CM | POA: Diagnosis not present

## 2019-09-15 DIAGNOSIS — J342 Deviated nasal septum: Secondary | ICD-10-CM | POA: Diagnosis not present

## 2019-09-15 DIAGNOSIS — K219 Gastro-esophageal reflux disease without esophagitis: Secondary | ICD-10-CM | POA: Diagnosis not present

## 2019-09-15 DIAGNOSIS — R053 Chronic cough: Secondary | ICD-10-CM | POA: Insufficient documentation

## 2019-09-15 DIAGNOSIS — Z974 Presence of external hearing-aid: Secondary | ICD-10-CM | POA: Diagnosis not present

## 2019-09-15 DIAGNOSIS — Z7289 Other problems related to lifestyle: Secondary | ICD-10-CM | POA: Diagnosis not present

## 2019-09-15 DIAGNOSIS — J383 Other diseases of vocal cords: Secondary | ICD-10-CM | POA: Diagnosis not present

## 2019-09-17 ENCOUNTER — Other Ambulatory Visit: Payer: Self-pay

## 2019-09-18 ENCOUNTER — Ambulatory Visit (INDEPENDENT_AMBULATORY_CARE_PROVIDER_SITE_OTHER): Payer: Medicare Other | Admitting: Neurology

## 2019-09-18 ENCOUNTER — Other Ambulatory Visit: Payer: Self-pay

## 2019-09-18 ENCOUNTER — Encounter: Payer: Self-pay | Admitting: Neurology

## 2019-09-18 VITALS — BP 153/90 | HR 70 | Ht 72.0 in | Wt 207.0 lb

## 2019-09-18 DIAGNOSIS — G7000 Myasthenia gravis without (acute) exacerbation: Secondary | ICD-10-CM | POA: Diagnosis not present

## 2019-09-18 MED ORDER — PREDNISONE 10 MG PO TABS: 10.0000 mg | ORAL_TABLET | Freq: Every day | ORAL | 3 refills | Status: DC

## 2019-09-18 NOTE — Progress Notes (Signed)
Follow-up Visit   Date: 09/18/19    [name] MRN: [id number] DOB: [birth year]   Interim History: [name] is a 78 y.o. right-handed Caucasian male with hyperlipidemia, COPD, peripheral neuropathy, SVT s/p ablation, BPH, former smoker, and diverticulitis returning to the clinic for follow-up of myasthenia gravis.  The patient was accompanied to the clinic by self.  History of present illness: Patient was diagnosed with myasthenia gravis in August 2019 after presenting with left ptosis, facial weakness, double vision, and dysphagia. He went to the ER for these symptoms where stroke was excluded and AChR antibodies returned positive. He was started on mestinon 30mg  three times daily (8am, 4pm, and midnight).  He was started on prednisone 20mg , with no improvement.  He was hospitalized from 9/19 - 07/23/2018 with MG exacerbated and treated with IVIG with great response. His prednisone was increased to 30mg  daily and he was started on IVIG, which helped control MG symptoms. During 2020, his prednisone was tapered and he has been on prednisone 10mg  daily.   He also complains of severe neck pain which is worse with prolonged standing and neck extension.  He usually has to sit back down and rest for relief. He does not have numbness/tingling of the hands or weakness.   He has previously been evaluated for neuropathy at [facility] and [place] with NCS/EMG which showed peripheral sensorimotor axonal neuropathy.  CSF testing was normal and genetic testing was normal.    UPDATE 09/18/2019:  He is here for follow-up visit.  He remains on prednisone 10mg  daily and Mestinon 30 mg 3 times daily.  He has noticed some weakness of the right eyelid, which is worse in warmer temperatures.  He does not have complete eye closure, double vision, difficulty swallowing, difficulty talking, jaw fatigue, or limb weakness.     He also has complaints of bilateral feet pain described as achy and throbbing  especially over the sole and ankle region.  He is followed by podiatry and being treated for plantar fasciitis and neuroma.  He has known history of neuropathy but reports that his current symptoms are different from his nerve related symptoms.  Medications:  Current Outpatient Medications on File Prior to Visit  Medication Sig Dispense Refill  . aspirin 81 MG tablet Take 1 tablet (81 mg total) by mouth daily.    Marland Kitchen atorvastatin (LIPITOR) 10 MG tablet Take 10 mg by mouth daily.    . Cyanocobalamin (VITAMIN B-12 PO) Take 5,000 mcg by mouth daily.     Marland Kitchen doxazosin (CARDURA) 8 MG tablet Take 8 mg by mouth daily.    . finasteride (PROSCAR) 5 MG tablet Take 5 mg by mouth every morning.   2  . fluticasone (FLONASE) 50 MCG/ACT nasal spray Place 2 sprays into both nostrils daily.    Marland Kitchen glucosamine-chondroitin 500-400 MG tablet Take by mouth.    . Multiple Vitamin (MULTIVITAMIN WITH MINERALS) TABS tablet Take 1 tablet by mouth daily.    . Psyllium (QC NATURAL VEGETABLE) 95 % POWD Take by mouth.    . pyridostigmine (MESTINON) 60 MG tablet TAKE 0.5 TABLETS (30 MG TOTAL) BY MOUTH 3 (THREE) TIMES DAILY. TAKE HALF TABLET AT 8AM, 1PM, AND 6PM 135 tablet 3  . sodium chloride (OCEAN) 0.65 % SOLN nasal spray Place 1 spray into both nostrils as needed for congestion.    . methylPREDNISolone (MEDROL DOSEPAK) 4 MG TBPK tablet 6 Day Taper Pack. Take as Directed. 21 tablet 0   No current facility-administered medications  on file prior to visit.     Allergies:  Allergies  Allergen Reactions  . Pollen Extract Other (See Comments)    Nasal congestion    Review of Systems:  CONSTITUTIONAL: No fevers, chills, night sweats, or weight loss.  EYES: No visual changes or eye pain ENT: No hearing changes.  No history of nose bleeds.   RESPIRATORY: No cough, wheezing and shortness of breath.   CARDIOVASCULAR: Negative for chest pain, and palpitations.   GI: +for abdominal discomfort, blood in stools or black stools.  No  recent change in bowel habits.   GU:  No history of incontinence.   MUSCLOSKELETAL: No history of joint pain or swelling.  No myalgias.   SKIN: Negative for lesions, rash, and itching.   ENDOCRINE: Negative for cold or heat intolerance, polydipsia or goiter.   PSYCH:  No depression or anxiety symptoms.   NEURO: As Above.   Vital Signs:  BP (!) 153/90   Pulse 70   Ht 6' (1.829 m)   Wt 207 lb (93.9 kg)   SpO2 98%   BMI 28.07 kg/m    Neurological Exam: MENTAL STATUS including orientation to time, place, person, recent and remote memory, attention span and concentration, language, and fund of knowledge is normal.  Speech is not dysarthric.  CRANIAL NERVES:   Pupils equal round and reactive to light.  Normal conjugate, extra-ocular eye movements in all directions of gaze.  Mild right ptosis, without worsening with sustained upgaze.  Face is symmetric.  Orbicularis oculi, buccinator, and orbicularis oris is 5/5.  Palate elevates symmetrically.  Tongue is midline, strength is 5/5.  MOTOR:  Motor strength is 5/5 in all extremities, without fatigability.  Neck flexion is 5/5.  No atrophy, fasciculations or abnormal movements.  No pronator drift.  Tone is normal.    SENSATION: Vibration is intact throughout and mildly reduced at the ankles bilaterally.  COORDINATION/GAIT:    Gait narrow based and stable. He is able to stand up from low chair without using arms.    Data: Labs 06/15/2018:  AChR binding 1.69*, MUSK neg  MRI brain wo contrast 06/14/2018: Negative for acute infarct. 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  CTA head and neck 06/14/2018: 1. Negative for large vessel occlusion, dissection, or arterial stenosis in the head or neck. There is minimal atherosclerosis. 2. Positive for generalized arterial tortuosity. Ectatic distal aortic arch and proximal descending aorta. 3. A small 15 mm area of ground-glass  opacity in the right upper lobe appears stable since 2015. 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  NCS/EMG of the legs 03/13/2018:  This is an abnormal study. There is electrophysiologic evidence oflength-dependent, moderately-severe, axonal sensory-motor polyneuropathy.  CT chest 07/11/2018:  Negative for thymoma  MRI cervical spine wo contrast 07/14/2018: 1. No acute finding. 2. Disc and facet degeneration causes foraminal impingement that is advanced on the left at c3-4 and on the left at C5-6. 3. Mild right foraminal narrowing at C5-6 and C6-7.   IMPRESSION/PLAN: 1.  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  He has previously been hospitalized in September 2019 with MG exacerbation and responded well to IVIG.  Since this time, he has been well-controlled on prednisone, maximal dose of prednisone 30mg /d and mestinon 30mg  TID. His exam today continues to show mild right ptosis, which is stable.  No bulbar or limb weakness. Continue prednisone 10mg  daily Continue mestinon 30mg  three times daily.  OK  to take extra mestinon 30mg  three times daily as needed for breakthrough symptoms  2.  Bilateral feet pain is more suggestive of musculoskeletal etiology and unlikely related to his peripheral neuropathy.    Return to clinic in 4 months  Greater than 50% of this 20 minute visit was spent in counseling, explanation of diagnosis, planning of further management, and coordination of care.   Thank you for allowing me to participate in patient's care.  If I can answer any additional questions, I would be pleased to do so.    Sincerely,    Marigrace Mccole K. [name], DO

## 2019-09-18 NOTE — Patient Instructions (Signed)
Continue prednisone 10mg  daily  Continue mestinon 30mg  three times daily.  You can take extra 30mg  as needed for breakthrough weakness  Return to clinic in 4 months

## 2019-10-01 ENCOUNTER — Ambulatory Visit (INDEPENDENT_AMBULATORY_CARE_PROVIDER_SITE_OTHER): Payer: Medicare Other | Admitting: Podiatry

## 2019-10-01 ENCOUNTER — Other Ambulatory Visit: Payer: Self-pay

## 2019-10-01 DIAGNOSIS — G5763 Lesion of plantar nerve, bilateral lower limbs: Secondary | ICD-10-CM | POA: Diagnosis not present

## 2019-10-15 ENCOUNTER — Ambulatory Visit (INDEPENDENT_AMBULATORY_CARE_PROVIDER_SITE_OTHER): Payer: Medicare Other | Admitting: Podiatry

## 2019-10-15 ENCOUNTER — Other Ambulatory Visit: Payer: Self-pay

## 2019-10-15 DIAGNOSIS — G5763 Lesion of plantar nerve, bilateral lower limbs: Secondary | ICD-10-CM | POA: Diagnosis not present

## 2019-10-29 ENCOUNTER — Ambulatory Visit (INDEPENDENT_AMBULATORY_CARE_PROVIDER_SITE_OTHER): Payer: Medicare Other | Admitting: Podiatry

## 2019-10-29 ENCOUNTER — Other Ambulatory Visit: Payer: Self-pay

## 2019-10-29 DIAGNOSIS — G5763 Lesion of plantar nerve, bilateral lower limbs: Secondary | ICD-10-CM

## 2019-10-29 NOTE — Progress Notes (Signed)
Subjective:  Patient ID: [name], male    DOB: 1941-07-23,  MRN: [id number]  Chief Complaint  Patient presents with  . Foot Pain    pt is here for a sclerosing injection    78 y.o. male presents with the above complaint. States the injection does help the ball of the feet. Still having pain in the ankles.   Review of Systems: Negative except as noted in the HPI. Denies N/V/F/Ch.  Past Medical History:  Diagnosis Date  . Abscess of sigmoid colon due to diverticulitis 12/26/2018  . Appendicitis 09/05/2014  . Colon polyp   . Complication of anesthesia    Difficult to arouse, Combative x 1  . Diverticulitis   . Diverticulosis    extensive 3'15.  . Dysrhythmia    hx. Ablation for tachycardia, no routine cardiology visits now  . ED (erectile dysfunction)   . Heart murmur    teenager  . History of colon polyps   . History of kidney stones    x1 passed  . Hyperlipidemia   . Myasthenia gravis ([place])   . Neuromuscular disorder ([place])    "neuroma"   . Osteoarthritis   . Palpitation    with svt rate 150  . 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 Tendinitis    achiles heel spur  . Testicular atrophy   . Tubular adenoma of colon     Current Outpatient Medications:  .  aspirin 81 MG tablet, Take 1 tablet (81 mg total) by mouth daily., Disp: , Rfl:  .  atorvastatin (LIPITOR) 10 MG tablet, Take 10 mg by mouth daily., Disp: , Rfl:  .  Cyanocobalamin (VITAMIN B-12 PO), Take 5,000 mcg by mouth daily. , Disp: , Rfl:  .  doxazosin (CARDURA) 8 MG tablet, Take 8 mg by mouth daily., Disp: , Rfl:  .  finasteride (PROSCAR) 5 MG tablet, Take 5 mg by mouth every morning. , Disp: , Rfl: 2 .  fluticasone (FLONASE) 50 MCG/ACT nasal spray, Place 2 sprays into both nostrils daily., Disp: , Rfl:  .  glucosamine-chondroitin 500-400 MG tablet, Take by mouth., Disp: , Rfl:  .  methylPREDNISolone (MEDROL DOSEPAK) 4 MG TBPK tablet,  6 Day Taper Pack. Take as Directed., Disp: 21 tablet, Rfl: 0 .  Multiple Vitamin (MULTIVITAMIN WITH MINERALS) TABS tablet, Take 1 tablet by mouth daily., Disp: , Rfl:  .  predniSONE (DELTASONE) 10 MG tablet, Take 1 tablet (10 mg total) by mouth daily with breakfast., Disp: 90 tablet, Rfl: 3 .  Psyllium (QC NATURAL VEGETABLE) 95 % POWD, Take by mouth., Disp: , Rfl:  .  pyridostigmine (MESTINON) 60 MG tablet, TAKE 0.5 TABLETS (30 MG TOTAL) BY MOUTH 3 (THREE) TIMES DAILY. TAKE HALF TABLET AT 8AM, 1PM, AND 6PM, Disp: 135 tablet, Rfl: 3 .  sodium chloride (OCEAN) 0.65 % SOLN nasal spray, Place 1 spray into both nostrils as needed for congestion., Disp: , Rfl:  .  saccharomyces boulardii (FLORASTOR) 250 MG capsule, Digest Probiotic (S.boulardii) 250 mg capsule  TAKE 1 CAPSULE BY MOUTH TWICE A DAY, Disp: , Rfl:   Social History   Tobacco Use  Smoking Status Former Smoker  . Packs/day: 1.00  . Years: 47.00  . Pack years: 47.00  . Types: Cigarettes  . Quit date: 10/29/2000  . Years since quitting: 19.0  Smokeless Tobacco Never Used    Allergies  Allergen Reactions  . Pollen Extract Other (See Comments)  Nasal congestion   Objective:  There were no vitals filed for this visit. There is no height or weight on file to calculate BMI. Constitutional Well developed. Well nourished.  Vascular Dorsalis pedis pulses palpable bilaterally. Posterior tibial pulses palpable bilaterally. Capillary refill normal to all digits.  No cyanosis or clubbing noted. Pedal hair growth normal.  Neurologic Normal speech. Oriented to person, place, and time. Epicritic sensation to light touch grossly present bilaterally.  Dermatologic Nails well groomed and normal in appearance. No open wounds. No skin lesions.  Orthopedic: POP 3rd interspace bilat   Radiographs: none Assessment:   1. Morton's metatarsalgia, neuralgia, or neuroma, bilateral    Plan:  Patient was evaluated and treated and all questions  answered.  Interdigital Neuroma, bilaterally -Sclerosing injections #3 as below  Procedure: Neurolysis Location: Bilateral 3rd interspace Skin Prep: Alcohol. Injectate: 4% alcohol sclerosing injection. Disposition: Patient tolerated procedure well. Injection site dressed with a band-aid.  Ankle Arthritis -Defer management at this time.  Return in about 2 weeks (around 11/12/2019).

## 2019-11-01 NOTE — Progress Notes (Signed)
Subjective:  Patient ID: [name], male    DOB: October 27, 1941,  MRN: [id number]  Chief Complaint  Patient presents with  . Foot Pain    pt is here for sclerosing injection f/u, pt states that his foot feels agile at times, and at the same time feel stiff at the same time    79 y.o. male presents with the above complaint. Hx as above.  Review of Systems: Negative except as noted in the HPI. Denies N/V/F/Ch.  Past Medical History:  Diagnosis Date  . Abscess of sigmoid colon due to diverticulitis 12/26/2018  . Appendicitis 09/05/2014  . Colon polyp   . Complication of anesthesia    Difficult to arouse, Combative x 1  . Diverticulitis   . Diverticulosis    extensive 3'15.  . Dysrhythmia    hx. Ablation for tachycardia, no routine cardiology visits now  . ED (erectile dysfunction)   . Heart murmur    teenager  . History of colon polyps   . History of kidney stones    x1 passed  . Hyperlipidemia   . Myasthenia gravis ([place])   . Neuromuscular disorder ([place])    "neuroma"   . Osteoarthritis   . Palpitation    with svt rate 150  . 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 Tendinitis    achiles heel spur  . Testicular atrophy   . Tubular adenoma of colon     Current Outpatient Medications:  .  aspirin 81 MG tablet, Take 1 tablet (81 mg total) by mouth daily., Disp: , Rfl:  .  atorvastatin (LIPITOR) 10 MG tablet, Take 10 mg by mouth daily., Disp: , Rfl:  .  Cyanocobalamin (VITAMIN B-12 PO), Take 5,000 mcg by mouth daily. , Disp: , Rfl:  .  doxazosin (CARDURA) 8 MG tablet, Take 8 mg by mouth daily., Disp: , Rfl:  .  finasteride (PROSCAR) 5 MG tablet, Take 5 mg by mouth every morning. , Disp: , Rfl: 2 .  fluticasone (FLONASE) 50 MCG/ACT nasal spray, Place 2 sprays into both nostrils daily., Disp: , Rfl:  .  glucosamine-chondroitin 500-400 MG tablet, Take by mouth., Disp: , Rfl:  .  methylPREDNISolone (MEDROL  DOSEPAK) 4 MG TBPK tablet, 6 Day Taper Pack. Take as Directed., Disp: 21 tablet, Rfl: 0 .  Multiple Vitamin (MULTIVITAMIN WITH MINERALS) TABS tablet, Take 1 tablet by mouth daily., Disp: , Rfl:  .  predniSONE (DELTASONE) 10 MG tablet, Take 1 tablet (10 mg total) by mouth daily with breakfast., Disp: 90 tablet, Rfl: 3 .  Psyllium (QC NATURAL VEGETABLE) 95 % POWD, Take by mouth., Disp: , Rfl:  .  pyridostigmine (MESTINON) 60 MG tablet, TAKE 0.5 TABLETS (30 MG TOTAL) BY MOUTH 3 (THREE) TIMES DAILY. TAKE HALF TABLET AT 8AM, 1PM, AND 6PM, Disp: 135 tablet, Rfl: 3 .  saccharomyces boulardii (FLORASTOR) 250 MG capsule, Digest Probiotic (S.boulardii) 250 mg capsule  TAKE 1 CAPSULE BY MOUTH TWICE A DAY, Disp: , Rfl:  .  sodium chloride (OCEAN) 0.65 % SOLN nasal spray, Place 1 spray into both nostrils as needed for congestion., Disp: , Rfl:   Social History   Tobacco Use  Smoking Status Former Smoker  . Packs/day: 1.00  . Years: 47.00  . Pack years: 47.00  . Types: Cigarettes  . Quit date: 10/29/2000  . Years since quitting: 19.0  Smokeless Tobacco Never Used    Allergies  Allergen Reactions  .  Pollen Extract Other (See Comments)    Nasal congestion   Objective:   There were no vitals filed for this visit. There is no height or weight on file to calculate BMI. Constitutional Well developed. Well nourished.  Vascular Dorsalis pedis pulses palpable bilaterally. Posterior tibial pulses palpable bilaterally. Capillary refill normal to all digits.  No cyanosis or clubbing noted. Pedal hair growth normal.  Neurologic Normal speech. Oriented to person, place, and time. Epicritic sensation to light touch grossly present bilaterally.  Dermatologic Nails well groomed and normal in appearance. No open wounds. No skin lesions.  Orthopedic: POP bilat 3rd MPJ with mulder's click Pain palpation about the sinus tarsi bilateral   Radiographs: None today Assessment:   1. Morton's metatarsalgia,  neuralgia, or neuroma, bilateral    Plan:  Patient was evaluated and treated and all questions answered.  Morton Neuroma Bilat -Sclerosing injection #2 as below  Procedure: Sclerosing Nerve Injection Location: Bilateral 3rd interspace Skin Prep: Alcohol. Injectate: 1.5 cc 4% sclerosing alcohol injection Disposition: Patient tolerated procedure well. Injection site dressed with a band-aid.     Return in about 2 weeks (around 10/29/2019) for sclerosing injection f/u.

## 2019-11-01 NOTE — Progress Notes (Signed)
Subjective:  Patient ID: [name], male    DOB: Jun 23, 1941,  MRN: [id number]  Chief Complaint  Patient presents with  . Neuroma    Pt states injections provided minimal relief but overal his feet feel the same.  . Ankle Pain    Pt states no changes, pain is the same.   79 y.o. male presents with the above complaint. Hx as above.   Review of Systems: Negative except as noted in the HPI. Denies N/V/F/Ch.  Past Medical History:  Diagnosis Date  . Abscess of sigmoid colon due to diverticulitis 12/26/2018  . Appendicitis 09/05/2014  . Colon polyp   . Complication of anesthesia    Difficult to arouse, Combative x 1  . Diverticulitis   . Diverticulosis    extensive 3'15.  . Dysrhythmia    hx. Ablation for tachycardia, no routine cardiology visits now  . ED (erectile dysfunction)   . Heart murmur    teenager  . History of colon polyps   . History of kidney stones    x1 passed  . Hyperlipidemia   . Myasthenia gravis ([place])   . Neuromuscular disorder ([place])    "neuroma"   . Osteoarthritis   . Palpitation    with svt rate 150  . 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 Tendinitis    achiles heel spur  . Testicular atrophy   . Tubular adenoma of colon     Current Outpatient Medications:  .  aspirin 81 MG tablet, Take 1 tablet (81 mg total) by mouth daily., Disp: , Rfl:  .  atorvastatin (LIPITOR) 10 MG tablet, Take 10 mg by mouth daily., Disp: , Rfl:  .  Cyanocobalamin (VITAMIN B-12 PO), Take 5,000 mcg by mouth daily. , Disp: , Rfl:  .  doxazosin (CARDURA) 8 MG tablet, Take 8 mg by mouth daily., Disp: , Rfl:  .  finasteride (PROSCAR) 5 MG tablet, Take 5 mg by mouth every morning. , Disp: , Rfl: 2 .  fluticasone (FLONASE) 50 MCG/ACT nasal spray, Place 2 sprays into both nostrils daily., Disp: , Rfl:  .  glucosamine-chondroitin 500-400 MG tablet, Take by mouth., Disp: , Rfl:  .  methylPREDNISolone (MEDROL  DOSEPAK) 4 MG TBPK tablet, 6 Day Taper Pack. Take as Directed., Disp: 21 tablet, Rfl: 0 .  Multiple Vitamin (MULTIVITAMIN WITH MINERALS) TABS tablet, Take 1 tablet by mouth daily., Disp: , Rfl:  .  predniSONE (DELTASONE) 10 MG tablet, Take 1 tablet (10 mg total) by mouth daily with breakfast., Disp: 90 tablet, Rfl: 3 .  Psyllium (QC NATURAL VEGETABLE) 95 % POWD, Take by mouth., Disp: , Rfl:  .  pyridostigmine (MESTINON) 60 MG tablet, TAKE 0.5 TABLETS (30 MG TOTAL) BY MOUTH 3 (THREE) TIMES DAILY. TAKE HALF TABLET AT 8AM, 1PM, AND 6PM, Disp: 135 tablet, Rfl: 3 .  sodium chloride (OCEAN) 0.65 % SOLN nasal spray, Place 1 spray into both nostrils as needed for congestion., Disp: , Rfl:  .  saccharomyces boulardii (FLORASTOR) 250 MG capsule, Digest Probiotic (S.boulardii) 250 mg capsule  TAKE 1 CAPSULE BY MOUTH TWICE A DAY, Disp: , Rfl:   Social History   Tobacco Use  Smoking Status Former Smoker  . Packs/day: 1.00  . Years: 47.00  . Pack years: 47.00  . Types: Cigarettes  . Quit date: 10/29/2000  . Years since quitting: 19.0  Smokeless Tobacco Never Used    Allergies  Allergen Reactions  .  Pollen Extract Other (See Comments)    Nasal congestion   Objective:   There were no vitals filed for this visit. There is no height or weight on file to calculate BMI. Constitutional Well developed. Well nourished.  Vascular Dorsalis pedis pulses palpable bilaterally. Posterior tibial pulses palpable bilaterally. Capillary refill normal to all digits.  No cyanosis or clubbing noted. Pedal hair growth normal.  Neurologic Normal speech. Oriented to person, place, and time. Epicritic sensation to light touch grossly present bilaterally.  Dermatologic Nails well groomed and normal in appearance. No open wounds. No skin lesions.  Orthopedic: POP bilat 3rd MPJ with mulder's click Pain palpation about the sinus tarsi bilateral   Radiographs: None today Assessment:   1. Morton's metatarsalgia,  neuralgia, or neuroma, bilateral    Plan:  Patient was evaluated and treated and all questions answered.  Morton Neuroma Bilat -Commence sclerosing injection therapy  Procedure: Sclerosing Nerve Injection Location: Bilateral 3rd interspace Skin Prep: Alcohol. Injectate: 1.5 cc 4% sclerosing alcohol injection Disposition: Patient tolerated procedure well. Injection site dressed with a band-aid.    Return in about 2 weeks (around 10/15/2019) for sclerosing injection f/u .

## 2019-11-03 ENCOUNTER — Other Ambulatory Visit: Payer: Self-pay | Admitting: Dermatology

## 2019-11-03 DIAGNOSIS — C44719 Basal cell carcinoma of skin of left lower limb, including hip: Secondary | ICD-10-CM | POA: Diagnosis not present

## 2019-11-03 DIAGNOSIS — C4491 Basal cell carcinoma of skin, unspecified: Secondary | ICD-10-CM

## 2019-11-03 HISTORY — DX: Basal cell carcinoma of skin, unspecified: C44.91

## 2019-11-12 ENCOUNTER — Other Ambulatory Visit: Payer: Self-pay

## 2019-11-12 ENCOUNTER — Ambulatory Visit (INDEPENDENT_AMBULATORY_CARE_PROVIDER_SITE_OTHER): Payer: Medicare Other | Admitting: Podiatry

## 2019-11-12 DIAGNOSIS — G5763 Lesion of plantar nerve, bilateral lower limbs: Secondary | ICD-10-CM

## 2019-11-12 NOTE — Progress Notes (Signed)
  Subjective:  Patient ID: [name], male    DOB: [birth year],  MRN: [id number]  Chief Complaint  Patient presents with  . Neuroma    Pt states sclerosing injections have been effective but some days he has more pain than others. Pt states he is still having some stiffness in bilateral ankles.    79 y.o. male presents with the above complaint. History confirmed with patient. States the feet are feeling better than not for the first time.  Objective:  Physical Exam: warm, good capillary refill, no trophic changes or ulcerative lesions, normal DP and PT pulses and normal sensory exam. Bilateral POP 3rd interspace  Assessment:   1. Morton's metatarsalgia, neuralgia, or neuroma, bilateral      Plan:  Patient was evaluated and treated and all questions answered.  Morton Neuroma -Sclerosing injection delivered to the affected interspaces. Injection #4 today.  Procedure: Sclerosing Nerve Injection Location: Bilateral 3rd interspace Skin Prep: Alcohol. Injectate: 1.5 cc 4% sclerosing alcohol injection Disposition: Patient tolerated procedure well. Injection site dressed with a band-aid.   Return in about 2 weeks (around 11/26/2019) for sclerosing alcohol injection.   MDM

## 2019-11-13 ENCOUNTER — Ambulatory Visit: Payer: Medicare Other

## 2019-11-14 ENCOUNTER — Ambulatory Visit: Payer: Medicare Other | Attending: Internal Medicine

## 2019-11-14 DIAGNOSIS — Z23 Encounter for immunization: Secondary | ICD-10-CM | POA: Diagnosis not present

## 2019-11-14 NOTE — Progress Notes (Signed)
   Covid-19 Vaccination Clinic  Name:  [name]    MRN: [id number] DOB: 05-30-1941  11/14/2019  [name] was observed post Covid-19 immunization for 15 minutes without incidence. He was provided with Vaccine Information Sheet and instruction to access the V-Safe system.   [name] was instructed to call 911 with any severe reactions post vaccine: Marland Kitchen Difficulty breathing  . Swelling of your face and throat  . A fast heartbeat  . A bad rash all over your body  . Dizziness and weakness    Immunizations Administered    Name Date Dose VIS Date Route   Pfizer COVID-19 Vaccine 11/14/2019 12:26 PM 0.3 mL 10/09/2019 Intramuscular   Manufacturer: [place]   Lot: [id number]   [place]: [address]-2

## 2019-11-26 ENCOUNTER — Ambulatory Visit: Payer: Medicare Other | Admitting: Podiatry

## 2019-12-03 ENCOUNTER — Other Ambulatory Visit: Payer: Self-pay

## 2019-12-03 ENCOUNTER — Ambulatory Visit (INDEPENDENT_AMBULATORY_CARE_PROVIDER_SITE_OTHER): Payer: Medicare Other | Admitting: Podiatry

## 2019-12-03 DIAGNOSIS — M722 Plantar fascial fibromatosis: Secondary | ICD-10-CM | POA: Diagnosis not present

## 2019-12-03 DIAGNOSIS — G5763 Lesion of plantar nerve, bilateral lower limbs: Secondary | ICD-10-CM

## 2019-12-03 NOTE — Progress Notes (Signed)
  Subjective:  Patient ID: [name], male    DOB: 08-Aug-1941,  MRN: [id number]  Chief Complaint  Patient presents with  . Neuroma    pt is here for a neuroma injection for both feet, pt states that he is still in pain, but the injection he recieved last time has helped tremendously, pt also states that pain will mostly occur at night time.   79 y.o. male presents with the above complaint. History confirmed with patient. States the feet are feeling better than not for the first time.  Objective:  Physical Exam: warm, good capillary refill, no trophic changes or ulcerative lesions, normal DP and PT pulses and normal sensory exam. Bilateral POP 3rd interspace  Assessment:   1. Morton's metatarsalgia, neuralgia, or neuroma, bilateral   2. Plantar fasciitis     Plan:  Patient was evaluated and treated and all questions answered.  Morton Neuroma -Injection delivered to the affected interspaces. Injection #5  Procedure: Sclerosing Nerve Injection Location: Bilateral 3rd interspace Skin Prep: Alcohol. Injectate: 1.5 cc 4% sclerosing alcohol injection Disposition: Patient tolerated procedure well. Injection site dressed with a band-aid.  Plantar Fasciitis R -Injection delivered to the plantar fascia of the right foot.  Procedure: Injection Tendon/Ligament Consent: Verbal consent obtained. Location: Right plantar fascia at the glabrous junction; medial approach. Skin Prep: Alcohol. Injectate: 1 cc 0.5% marcaine plain, 1 cc dexamethasone phosphate, 0.5 cc kenalog 10. Disposition: Patient tolerated procedure well. Injection site dressed with a band-aid.  Return in about 2 weeks (around 12/17/2019) for Neuroma, Bilateral.

## 2019-12-05 ENCOUNTER — Ambulatory Visit: Payer: Medicare Other | Attending: Internal Medicine

## 2019-12-05 DIAGNOSIS — Z23 Encounter for immunization: Secondary | ICD-10-CM | POA: Insufficient documentation

## 2019-12-05 NOTE — Progress Notes (Signed)
   Covid-19 Vaccination Clinic  Name:  [name]    MRN: [id number] DOB: 07-01-1941  12/05/2019  [name] was observed post Covid-19 immunization for 15 minutes without incidence. He was provided with Vaccine Information Sheet and instruction to access the V-Safe system.   [name] was instructed to call 911 with any severe reactions post vaccine: Marland Kitchen Difficulty breathing  . Swelling of your face and throat  . A fast heartbeat  . A bad rash all over your body  . Dizziness and weakness    Immunizations Administered    Name Date Dose VIS Date Route   Pfizer COVID-19 Vaccine 12/05/2019 12:50 PM 0.3 mL 10/09/2019 Intramuscular   Manufacturer: [place]   Lot: [id number]   [place]: [address]-8

## 2019-12-14 DIAGNOSIS — E782 Mixed hyperlipidemia: Secondary | ICD-10-CM | POA: Diagnosis not present

## 2019-12-14 DIAGNOSIS — N401 Enlarged prostate with lower urinary tract symptoms: Secondary | ICD-10-CM | POA: Diagnosis not present

## 2019-12-14 DIAGNOSIS — N400 Enlarged prostate without lower urinary tract symptoms: Secondary | ICD-10-CM | POA: Diagnosis not present

## 2019-12-14 DIAGNOSIS — J449 Chronic obstructive pulmonary disease, unspecified: Secondary | ICD-10-CM | POA: Diagnosis not present

## 2019-12-14 DIAGNOSIS — E785 Hyperlipidemia, unspecified: Secondary | ICD-10-CM | POA: Diagnosis not present

## 2019-12-18 ENCOUNTER — Ambulatory Visit (INDEPENDENT_AMBULATORY_CARE_PROVIDER_SITE_OTHER): Payer: Medicare Other | Admitting: Podiatry

## 2019-12-18 ENCOUNTER — Other Ambulatory Visit: Payer: Self-pay

## 2019-12-18 VITALS — Temp 97.9°F

## 2019-12-18 DIAGNOSIS — G5763 Lesion of plantar nerve, bilateral lower limbs: Secondary | ICD-10-CM | POA: Diagnosis not present

## 2019-12-18 NOTE — Progress Notes (Signed)
  Subjective:  Patient ID: [name], male    DOB: [birth year],  MRN: [id number]  Chief Complaint  Patient presents with  . Neuroma    Bilateral. Pt stated, "Average pain = 5/10. Interested in injections".   79 y.o. male presents with the above complaint. History confirmed with patient. Areas continue to improve but with pain on and off  Objective:  Physical Exam: warm, good capillary refill, no trophic changes or ulcerative lesions, normal DP and PT pulses and normal sensory exam. Bilateral POP 3rd interspace  Assessment:   1. Morton's metatarsalgia, neuralgia, or neuroma, bilateral     Plan:  Patient was evaluated and treated and all questions answered.  Morton Neuroma -Injection delivered to the affected interspaces. Injection #6  Procedure: Neurolysis Location: Bilateral 3rd interspace Skin Prep: Alcohol. Injectate: 4% alcohol sclerosing injection. Disposition: Patient tolerated procedure well. Injection site dressed with a band-aid.   Return in about 2 weeks (around 01/01/2020) for Neuroma, Bilateral.

## 2019-12-24 DIAGNOSIS — G7000 Myasthenia gravis without (acute) exacerbation: Secondary | ICD-10-CM | POA: Diagnosis not present

## 2019-12-24 DIAGNOSIS — E8809 Other disorders of plasma-protein metabolism, not elsewhere classified: Secondary | ICD-10-CM | POA: Diagnosis not present

## 2019-12-24 DIAGNOSIS — E782 Mixed hyperlipidemia: Secondary | ICD-10-CM | POA: Diagnosis not present

## 2019-12-24 DIAGNOSIS — J449 Chronic obstructive pulmonary disease, unspecified: Secondary | ICD-10-CM | POA: Diagnosis not present

## 2019-12-24 DIAGNOSIS — G8929 Other chronic pain: Secondary | ICD-10-CM | POA: Diagnosis not present

## 2019-12-24 DIAGNOSIS — Z79899 Other long term (current) drug therapy: Secondary | ICD-10-CM | POA: Diagnosis not present

## 2019-12-24 DIAGNOSIS — Z8679 Personal history of other diseases of the circulatory system: Secondary | ICD-10-CM | POA: Diagnosis not present

## 2019-12-24 DIAGNOSIS — N401 Enlarged prostate with lower urinary tract symptoms: Secondary | ICD-10-CM | POA: Diagnosis not present

## 2019-12-24 DIAGNOSIS — Z0000 Encounter for general adult medical examination without abnormal findings: Secondary | ICD-10-CM | POA: Diagnosis not present

## 2019-12-24 DIAGNOSIS — R911 Solitary pulmonary nodule: Secondary | ICD-10-CM | POA: Diagnosis not present

## 2019-12-24 DIAGNOSIS — Z86010 Personal history of colonic polyps: Secondary | ICD-10-CM | POA: Diagnosis not present

## 2020-01-01 ENCOUNTER — Ambulatory Visit: Payer: Medicare Other | Admitting: Podiatry

## 2020-01-05 DIAGNOSIS — Z86010 Personal history of colonic polyps: Secondary | ICD-10-CM | POA: Diagnosis not present

## 2020-01-05 DIAGNOSIS — E8809 Other disorders of plasma-protein metabolism, not elsewhere classified: Secondary | ICD-10-CM | POA: Diagnosis not present

## 2020-01-05 DIAGNOSIS — N401 Enlarged prostate with lower urinary tract symptoms: Secondary | ICD-10-CM | POA: Diagnosis not present

## 2020-01-05 DIAGNOSIS — E782 Mixed hyperlipidemia: Secondary | ICD-10-CM | POA: Diagnosis not present

## 2020-01-05 DIAGNOSIS — R911 Solitary pulmonary nodule: Secondary | ICD-10-CM | POA: Diagnosis not present

## 2020-01-05 DIAGNOSIS — Z0000 Encounter for general adult medical examination without abnormal findings: Secondary | ICD-10-CM | POA: Diagnosis not present

## 2020-01-05 DIAGNOSIS — Z8679 Personal history of other diseases of the circulatory system: Secondary | ICD-10-CM | POA: Diagnosis not present

## 2020-01-05 DIAGNOSIS — Z79899 Other long term (current) drug therapy: Secondary | ICD-10-CM | POA: Diagnosis not present

## 2020-01-05 DIAGNOSIS — G8929 Other chronic pain: Secondary | ICD-10-CM | POA: Diagnosis not present

## 2020-01-05 DIAGNOSIS — J449 Chronic obstructive pulmonary disease, unspecified: Secondary | ICD-10-CM | POA: Diagnosis not present

## 2020-01-05 DIAGNOSIS — G7000 Myasthenia gravis without (acute) exacerbation: Secondary | ICD-10-CM | POA: Diagnosis not present

## 2020-01-07 DIAGNOSIS — N400 Enlarged prostate without lower urinary tract symptoms: Secondary | ICD-10-CM | POA: Diagnosis not present

## 2020-01-07 DIAGNOSIS — J449 Chronic obstructive pulmonary disease, unspecified: Secondary | ICD-10-CM | POA: Diagnosis not present

## 2020-01-07 DIAGNOSIS — N401 Enlarged prostate with lower urinary tract symptoms: Secondary | ICD-10-CM | POA: Diagnosis not present

## 2020-01-07 DIAGNOSIS — E785 Hyperlipidemia, unspecified: Secondary | ICD-10-CM | POA: Diagnosis not present

## 2020-01-07 DIAGNOSIS — E782 Mixed hyperlipidemia: Secondary | ICD-10-CM | POA: Diagnosis not present

## 2020-01-08 ENCOUNTER — Other Ambulatory Visit: Payer: Self-pay

## 2020-01-08 ENCOUNTER — Ambulatory Visit (INDEPENDENT_AMBULATORY_CARE_PROVIDER_SITE_OTHER): Payer: Medicare Other | Admitting: Podiatry

## 2020-01-08 DIAGNOSIS — G5763 Lesion of plantar nerve, bilateral lower limbs: Secondary | ICD-10-CM | POA: Diagnosis not present

## 2020-01-08 DIAGNOSIS — M722 Plantar fascial fibromatosis: Secondary | ICD-10-CM

## 2020-01-08 NOTE — Progress Notes (Addendum)
  Subjective:  Patient ID: [name], male    DOB: 10/10/1941,  MRN: [id number]  Chief Complaint  Patient presents with  . Ankle Pain    Pt states bilateral ankle stiffness is still present.  . Neuroma    Pt states occasional pain.   79 y.o. male presents with the above complaint. History confirmed with patient. States the heels are hurting more than the feet at this point.  Objective:  Physical Exam: warm, good capillary refill, no trophic changes or ulcerative lesions, normal DP and PT pulses and normal sensory exam. Bilateral POP 3rd interspace Pain bilateral medial calc tubers.  Assessment:   1. Morton's metatarsalgia, neuralgia, or neuroma, bilateral   2. Plantar fasciitis     Plan:  Patient was evaluated and treated and all questions answered.  Morton Neuroma -Injection delivered to the affected interspaces. Injection #7  Procedure: Neurolysis Location: Bilateral 3rd interspace Skin Prep: Alcohol. Injectate: 4% alcohol sclerosing injection. Disposition: Patient tolerated procedure well. Injection site dressed with a band-aid.  Plantar Fasciitis -Dispense Plantar fascial brace x2  No follow-ups on file.

## 2020-01-22 ENCOUNTER — Ambulatory Visit (INDEPENDENT_AMBULATORY_CARE_PROVIDER_SITE_OTHER): Payer: Medicare Other | Admitting: Podiatry

## 2020-01-22 ENCOUNTER — Other Ambulatory Visit: Payer: Self-pay

## 2020-01-22 DIAGNOSIS — M25571 Pain in right ankle and joints of right foot: Secondary | ICD-10-CM

## 2020-01-22 DIAGNOSIS — M25572 Pain in left ankle and joints of left foot: Secondary | ICD-10-CM

## 2020-01-25 ENCOUNTER — Other Ambulatory Visit: Payer: Self-pay

## 2020-01-25 ENCOUNTER — Telehealth (INDEPENDENT_AMBULATORY_CARE_PROVIDER_SITE_OTHER): Payer: Medicare Other | Admitting: Neurology

## 2020-01-25 ENCOUNTER — Encounter: Payer: Self-pay | Admitting: Neurology

## 2020-01-25 VITALS — Ht 72.0 in | Wt 195.0 lb

## 2020-01-25 DIAGNOSIS — Z87891 Personal history of nicotine dependence: Secondary | ICD-10-CM

## 2020-01-25 DIAGNOSIS — G7000 Myasthenia gravis without (acute) exacerbation: Secondary | ICD-10-CM

## 2020-01-25 NOTE — Progress Notes (Signed)
   Virtual Visit via Video Note The purpose of this virtual visit is to provide medical care while limiting exposure to the novel coronavirus.    Consent was obtained for video visit:  Yes.   Answered questions that patient had about telehealth interaction:  Yes.   I discussed the limitations, risks, security and privacy concerns of performing an evaluation and management service by telemedicine. I also discussed with the patient that there may be a patient responsible charge related to this service. The patient expressed understanding and agreed to proceed.  Pt location: Home Physician Location: office Name of referring provider:  Hulan Fess, MD I connected with Freida Busman at patients initiation/request on 01/25/2020 at 10:30 AM EDT by video enabled telemedicine application and verified that I am speaking with the correct person using two identifiers. Pt MRN:  [id number] Pt DOB:  10-Jan-1941 Video Participants:  Freida Busman   History of Present Illness: This is a 79 y.o. male returning for follow-up of seropositive myasthenia gravis.  He is doing very well from myasthenia standpoint and denies any double vision, droopy eyelids, difficulty swallowing/talking, or limb weakness.  He is compliant with his medications and tolerating prednisone 10 mg and Mestinon 30 mg 3 times daily well.  No new complaints today.  He has received both Covid vaccinations.  Observations/Objective:   Vitals:   01/25/20 0819  Weight: 195 lb (88.5 kg)  Height: 6' (1.829 m)   Patient is awake, alert, and appears comfortable.  Oriented x 4.   Extraocular muscles are intact.  Trace right ptosis (stable).  Face is symmetric.  Speech is not dysarthric. Tongue is midline. Antigravity in all extremities, able to stand up without using arms to push off.   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  He  was hospitalized in September 2019 and responsive to IVIG.  Since this time, he has been well controlled on tapering dose of prednisone.  Maximal dose of prednisone was 30 mg/d and mestinon 30mg  TID.  Continue prednisone 10 mg daily Continue Mestinon 30 mg 3 times daily.  Okay to take extra mestinon 30mg  daily as needed for breakthrough symptoms Patient informed of risk of pseudoexacerbation in warmer temperatures and encouraged to stay in a cooler environments. If he develops a true exacerbation, start prednisone 20 mg daily.  Follow Up Instructions:   I discussed the assessment and treatment plan with the patient. The patient was provided an opportunity to ask questions and all were answered. The patient agreed with the plan and demonstrated an understanding of the instructions.   The patient was advised to call back or seek an in-person evaluation if the symptoms worsen or if the condition fails to improve as anticipated.  Follow-up in 5-6 months     Alda Berthold, DO

## 2020-01-27 DIAGNOSIS — Z87891 Personal history of nicotine dependence: Secondary | ICD-10-CM | POA: Diagnosis not present

## 2020-01-27 DIAGNOSIS — J449 Chronic obstructive pulmonary disease, unspecified: Secondary | ICD-10-CM | POA: Diagnosis not present

## 2020-01-27 DIAGNOSIS — R911 Solitary pulmonary nodule: Secondary | ICD-10-CM | POA: Diagnosis not present

## 2020-01-27 DIAGNOSIS — J439 Emphysema, unspecified: Secondary | ICD-10-CM | POA: Diagnosis not present

## 2020-01-27 DIAGNOSIS — R05 Cough: Secondary | ICD-10-CM | POA: Diagnosis not present

## 2020-01-27 DIAGNOSIS — Z79899 Other long term (current) drug therapy: Secondary | ICD-10-CM | POA: Diagnosis not present

## 2020-01-27 DIAGNOSIS — R0982 Postnasal drip: Secondary | ICD-10-CM | POA: Diagnosis not present

## 2020-02-04 DIAGNOSIS — R0609 Other forms of dyspnea: Secondary | ICD-10-CM | POA: Diagnosis not present

## 2020-02-04 DIAGNOSIS — Z87891 Personal history of nicotine dependence: Secondary | ICD-10-CM | POA: Diagnosis not present

## 2020-02-04 DIAGNOSIS — R05 Cough: Secondary | ICD-10-CM | POA: Diagnosis not present

## 2020-02-05 DIAGNOSIS — Z87891 Personal history of nicotine dependence: Secondary | ICD-10-CM | POA: Diagnosis not present

## 2020-02-05 DIAGNOSIS — J449 Chronic obstructive pulmonary disease, unspecified: Secondary | ICD-10-CM | POA: Diagnosis not present

## 2020-02-12 ENCOUNTER — Ambulatory Visit (INDEPENDENT_AMBULATORY_CARE_PROVIDER_SITE_OTHER): Payer: Medicare Other | Admitting: Podiatry

## 2020-02-12 ENCOUNTER — Other Ambulatory Visit: Payer: Self-pay

## 2020-02-12 DIAGNOSIS — M722 Plantar fascial fibromatosis: Secondary | ICD-10-CM

## 2020-02-12 DIAGNOSIS — G5763 Lesion of plantar nerve, bilateral lower limbs: Secondary | ICD-10-CM | POA: Diagnosis not present

## 2020-02-18 DIAGNOSIS — E785 Hyperlipidemia, unspecified: Secondary | ICD-10-CM | POA: Diagnosis not present

## 2020-02-18 DIAGNOSIS — N401 Enlarged prostate with lower urinary tract symptoms: Secondary | ICD-10-CM | POA: Diagnosis not present

## 2020-02-18 DIAGNOSIS — J449 Chronic obstructive pulmonary disease, unspecified: Secondary | ICD-10-CM | POA: Diagnosis not present

## 2020-02-18 DIAGNOSIS — E782 Mixed hyperlipidemia: Secondary | ICD-10-CM | POA: Diagnosis not present

## 2020-02-18 DIAGNOSIS — N400 Enlarged prostate without lower urinary tract symptoms: Secondary | ICD-10-CM | POA: Diagnosis not present

## 2020-04-11 NOTE — Progress Notes (Signed)
  Subjective:  Patient ID: [name], male    DOB: [birth year],  MRN: [id number]  Chief Complaint  Patient presents with  . Follow-up    bilateral plantar fasciitis . pt states that the PF brace that was give has helped a lot. he continues to wear daily   . Arthritis  . Neuroma    bilateral , injection has helped but still has some soreness   79 y.o. male presents with the above complaint. History confirmed with patient. States the heels are hurting more than the feet at this point.  Objective:  Physical Exam: warm, good capillary refill, no trophic changes or ulcerative lesions, normal DP and PT pulses and normal sensory exam. Bilateral POP 3rd interspace Pain bilateral medial calc tubers.  Assessment:   1. Morton's metatarsalgia, neuralgia, or neuroma, bilateral   2. Plantar fasciitis     Plan:  Patient was evaluated and treated and all questions answered.  Morton Neuroma -Appears resolved. Thinks that they are overall doing much better than prior to starting treatment. Still having hindfoot arthritic pain we have had difficulty taking care of. At this point patient wishes to f/u as needed. States I have helped more than other providers. I advised if he would like further care for his hindfoot to come back for eval  No follow-ups on file.

## 2020-04-13 DIAGNOSIS — M79673 Pain in unspecified foot: Secondary | ICD-10-CM | POA: Diagnosis not present

## 2020-04-25 ENCOUNTER — Ambulatory Visit (INDEPENDENT_AMBULATORY_CARE_PROVIDER_SITE_OTHER): Payer: Medicare Other

## 2020-04-25 ENCOUNTER — Encounter: Payer: Self-pay | Admitting: Podiatry

## 2020-04-25 ENCOUNTER — Ambulatory Visit (INDEPENDENT_AMBULATORY_CARE_PROVIDER_SITE_OTHER): Payer: Medicare Other | Admitting: Podiatry

## 2020-04-25 ENCOUNTER — Other Ambulatory Visit: Payer: Self-pay

## 2020-04-25 DIAGNOSIS — G629 Polyneuropathy, unspecified: Secondary | ICD-10-CM

## 2020-04-25 DIAGNOSIS — R52 Pain, unspecified: Secondary | ICD-10-CM | POA: Diagnosis not present

## 2020-04-25 DIAGNOSIS — M779 Enthesopathy, unspecified: Secondary | ICD-10-CM

## 2020-04-25 NOTE — Progress Notes (Signed)
  Subjective:  Patient ID: [name], male    DOB: Jan 15, 1941,  MRN: [id number]  Chief Complaint  Patient presents with  . Neuroma    Pt states improvement with injections  . Foot Pain    Pt states bilateral plantar fascial braces have been effective.   79 y.o. male presents with the above complaint. History confirmed with patient.   Objective:  Physical Exam: warm, good capillary refill, no trophic changes or ulcerative lesions, normal DP and PT pulses and normal sensory exam. Bilateral POP 3rd interspace Pain bilateral medial calc tubers, sinus tarsi bilat.  Assessment:   1. Sinus tarsi syndrome of both ankles     Plan:  Patient was evaluated and treated and all questions answered.  Sinus Tarsi Syndrome -Repeat injections bilateral sinus tarsi  Procedure: Injection Intermediate Joint Consent: Verbal consent obtained. Location: Bilateral sinus tarsi. Skin Prep: alcohol. Injectate: 1 cc 0.5% marcaine plain, 1 cc dexamethasone phosphate, 0.5 cc kenalog 10. Disposition: Patient tolerated procedure well. Injection site dressed with a band-aid.  Plantar Fasciitis -Continue PF braces.  Return in about 3 weeks (around 02/12/2020) for Arthritis, Bilateral, Neuroma.

## 2020-04-26 ENCOUNTER — Other Ambulatory Visit: Payer: Self-pay | Admitting: Podiatry

## 2020-04-26 DIAGNOSIS — M779 Enthesopathy, unspecified: Secondary | ICD-10-CM

## 2020-04-26 DIAGNOSIS — G629 Polyneuropathy, unspecified: Secondary | ICD-10-CM

## 2020-04-28 NOTE — Progress Notes (Signed)
Subjective:   Patient ID: [name], male   DOB: 79 y.o.   MRN: [id number]   HPI Patient states he is a lot of pain in his feet for a long time and it is bad in his ankle joints and he also has pain in his forefeet bilateral with bone exposure that is bothersome for him.  States that he has been to other doctors they have tried some other things but nothing has really helped him is never had anything done with his ankles and he does not smoke likes to be active   Review of Systems  All other systems reviewed and are negative.       Objective:  Physical Exam Vitals and nursing note reviewed.  Constitutional:      Appearance: He is well-developed.  Pulmonary:     Effort: Pulmonary effort is normal.  Musculoskeletal:        General: Normal range of motion.  Skin:    General: Skin is warm.  Neurological:     Mental Status: He is alert.     Neurovascular status was found to be intact muscle strength adequate.  Range of motion diminished subtalar midtarsal joint with exquisite discomfort in the sinus tarsi bilateral with prominent bone structure around the lesser metatarsal bones bilateral that are moderately painful when palpated.  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  Reappoint for this to be done  X-rays were negative for signs of fracture did indicate moderate arthritis no other pathology noted

## 2020-05-03 DIAGNOSIS — R001 Bradycardia, unspecified: Secondary | ICD-10-CM | POA: Insufficient documentation

## 2020-05-03 DIAGNOSIS — I440 Atrioventricular block, first degree: Secondary | ICD-10-CM | POA: Diagnosis present

## 2020-05-03 DIAGNOSIS — J9601 Acute respiratory failure with hypoxia: Secondary | ICD-10-CM | POA: Diagnosis not present

## 2020-05-03 DIAGNOSIS — R0602 Shortness of breath: Secondary | ICD-10-CM | POA: Diagnosis not present

## 2020-05-03 DIAGNOSIS — E785 Hyperlipidemia, unspecified: Secondary | ICD-10-CM | POA: Diagnosis present

## 2020-05-03 DIAGNOSIS — E876 Hypokalemia: Secondary | ICD-10-CM | POA: Diagnosis not present

## 2020-05-03 DIAGNOSIS — R079 Chest pain, unspecified: Secondary | ICD-10-CM | POA: Diagnosis not present

## 2020-05-03 DIAGNOSIS — R0781 Pleurodynia: Secondary | ICD-10-CM | POA: Diagnosis not present

## 2020-05-03 DIAGNOSIS — J440 Chronic obstructive pulmonary disease with acute lower respiratory infection: Secondary | ICD-10-CM | POA: Diagnosis not present

## 2020-05-03 DIAGNOSIS — I4510 Unspecified right bundle-branch block: Secondary | ICD-10-CM | POA: Diagnosis present

## 2020-05-03 DIAGNOSIS — R002 Palpitations: Secondary | ICD-10-CM | POA: Diagnosis not present

## 2020-05-03 DIAGNOSIS — Z7952 Long term (current) use of systemic steroids: Secondary | ICD-10-CM | POA: Insufficient documentation

## 2020-05-03 DIAGNOSIS — G7000 Myasthenia gravis without (acute) exacerbation: Secondary | ICD-10-CM | POA: Diagnosis not present

## 2020-05-03 DIAGNOSIS — I10 Essential (primary) hypertension: Secondary | ICD-10-CM | POA: Diagnosis not present

## 2020-05-03 DIAGNOSIS — I2510 Atherosclerotic heart disease of native coronary artery without angina pectoris: Secondary | ICD-10-CM | POA: Diagnosis present

## 2020-05-03 DIAGNOSIS — Z20822 Contact with and (suspected) exposure to covid-19: Secondary | ICD-10-CM | POA: Diagnosis not present

## 2020-05-03 DIAGNOSIS — I313 Pericardial effusion (noninflammatory): Secondary | ICD-10-CM | POA: Diagnosis not present

## 2020-05-03 DIAGNOSIS — N400 Enlarged prostate without lower urinary tract symptoms: Secondary | ICD-10-CM | POA: Diagnosis not present

## 2020-05-03 DIAGNOSIS — J42 Unspecified chronic bronchitis: Secondary | ICD-10-CM | POA: Diagnosis not present

## 2020-05-03 DIAGNOSIS — R071 Chest pain on breathing: Secondary | ICD-10-CM | POA: Diagnosis not present

## 2020-05-03 DIAGNOSIS — Z87891 Personal history of nicotine dependence: Secondary | ICD-10-CM | POA: Diagnosis not present

## 2020-05-03 DIAGNOSIS — R0902 Hypoxemia: Secondary | ICD-10-CM | POA: Diagnosis not present

## 2020-05-03 DIAGNOSIS — Z974 Presence of external hearing-aid: Secondary | ICD-10-CM | POA: Diagnosis not present

## 2020-05-03 DIAGNOSIS — J189 Pneumonia, unspecified organism: Secondary | ICD-10-CM | POA: Diagnosis not present

## 2020-05-03 DIAGNOSIS — J9811 Atelectasis: Secondary | ICD-10-CM | POA: Diagnosis not present

## 2020-05-03 DIAGNOSIS — H9193 Unspecified hearing loss, bilateral: Secondary | ICD-10-CM | POA: Diagnosis present

## 2020-05-04 ENCOUNTER — Encounter: Payer: Self-pay | Admitting: *Deleted

## 2020-05-08 NOTE — Progress Notes (Signed)
Subjective:  Patient ID: [name], male    DOB: July 13, 1941,  MRN: [id number]  Chief Complaint  Patient presents with  . Foot Pain    neuroma f/u, pt states that he feels the same as last time, pt also states that he is here for mri results   79 y.o. male presents with the above complaint. Hx as above.  Review of Systems: Negative except as noted in the HPI. Denies N/V/F/Ch.  Past Medical History:  Diagnosis Date  . Abscess of sigmoid colon due to diverticulitis 12/26/2018  . Appendicitis 09/05/2014  . Basal cell carcinoma 10/04/2015   left [id number]  . BCC (basal cell carcinoma) 11/03/2019   left shin (txpbx)  . Colon polyp   . Complication of anesthesia    Difficult to arouse, Combative x 1  . Diverticulitis   . Diverticulosis    extensive 3'15.  . Dysrhythmia    hx. Ablation for tachycardia, no routine cardiology visits now  . ED (erectile dysfunction)   . Heart murmur    teenager  . History of colon polyps   . History of kidney stones    x1 passed  . Hyperlipidemia   . Myasthenia gravis ([place])   . Neuromuscular disorder ([place])    "neuroma"   . Osteoarthritis   . Palpitation    with svt rate 150  . 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 Tendinitis    achiles heel spur  . Testicular atrophy   . Tubular adenoma of colon     Current Outpatient Medications:  .  aspirin 81 MG tablet, Take 1 tablet (81 mg total) by mouth daily., Disp: , Rfl:  .  atorvastatin (LIPITOR) 10 MG tablet, Take 10 mg by mouth daily., Disp: , Rfl:  .  Cyanocobalamin (VITAMIN B-12 PO), Take 5,000 mcg by mouth daily. , Disp: , Rfl:  .  doxazosin (CARDURA) 8 MG tablet, Take 8 mg by mouth daily., Disp: , Rfl:  .  finasteride (PROSCAR) 5 MG tablet, Take 5 mg by mouth every morning. , Disp: , Rfl: 2 .  fluticasone (FLONASE) 50 MCG/ACT nasal  spray, Place 2 sprays into both nostrils daily., Disp: , Rfl:  .  Multiple Vitamin (MULTIVITAMIN WITH MINERALS) TABS tablet, Take 1 tablet by mouth daily., Disp: , Rfl:  .  pyridostigmine (MESTINON) 60 MG tablet, TAKE 0.5 TABLETS (30 MG TOTAL) BY MOUTH 3 (THREE) TIMES DAILY. TAKE HALF TABLET AT 8AM, 1PM, AND 6PM, Disp: 135 tablet, Rfl: 3 .  sodium chloride (OCEAN) 0.65 % SOLN nasal spray, Place 1 spray into both nostrils as needed for congestion., Disp: , Rfl:  .  COMBIVENT RESPIMAT 20-100 MCG/ACT AERS respimat, , Disp: , Rfl:  .  glucosamine-chondroitin 500-400 MG tablet, Take by mouth., Disp: , Rfl:  .  ipratropium (ATROVENT) 0.03 % nasal spray, SMARTSIG:2 Spray(s) Both Nares Every 12 Hours, Disp: , Rfl:  .  mupirocin ointment (BACTROBAN) 2 %, APPLY TO AFFECTED AREA EVERY DAY, Disp: , Rfl:  .  predniSONE (DELTASONE) 10 MG tablet, Take 1 tablet (10 mg total) by mouth daily with breakfast., Disp: 90 tablet, Rfl: 3 .  Psyllium (QC NATURAL VEGETABLE) 95 % POWD, Take by mouth., Disp: , Rfl:  .  traMADol (ULTRAM) 50 MG tablet, Take 25-50 mg by mouth 2 (two) times daily as needed., Disp: , Rfl:  Social History   Tobacco Use  Smoking Status Former Smoker  . Packs/day: 1.00  . Years: 47.00  . Pack years: 47.00  . Types: Cigarettes  . Quit date: 10/29/2000  . Years since quitting: 19.5  Smokeless Tobacco Never Used    Allergies  Allergen Reactions  . Ambrosia Trifida ([place]weed) Allergy Skin Test Anaphylaxis  . Bee Pollen Itching    Nasal congestion  . Pollen Extract Other (See Comments)    Nasal congestion   Objective:   There were no vitals filed for this visit. There is no height or weight on file to calculate BMI. Constitutional Well developed. Well nourished.  Vascular Dorsalis pedis pulses palpable bilaterally. Posterior tibial pulses palpable bilaterally. Capillary refill normal to all digits.  No cyanosis or clubbing noted. Pedal hair growth normal.  Neurologic Normal  speech. Oriented to person, place, and time. Epicritic sensation to light touch grossly present bilaterally.  Dermatologic Nails well groomed and normal in appearance. No open wounds. No skin lesions.  Orthopedic: POP bilat 3rd MPJ with mulder's click Pain palpation about the sinus tarsi bilateral   Radiographs: None today Assessment:   1. Osteochondral defect of ankle   2. Peroneal tendonitis of right lower leg   3. Ankle instability, right    Plan:  Patient was evaluated and treated and all questions answered.  Morton Neuroma Bilat -Resolved  Tendonitis Right ankle, sinus tarsitis -MRI reviewed with patient.  Attenuated ATFL, neurogenic edema, OCD along the lateral talar dome, peroneus longus tendinopathy, mild tibialis posterior flexor hallucis longus tenosynovitis -Rx Medrol pack for the above   Return in about 1 month (around 09/06/2019) for Right talus OCD, peroneal tendonitis, ATFL insufficiency .

## 2020-05-09 ENCOUNTER — Encounter: Payer: Self-pay | Admitting: Dermatology

## 2020-05-09 ENCOUNTER — Telehealth: Payer: Self-pay | Admitting: Cardiology

## 2020-05-09 ENCOUNTER — Other Ambulatory Visit: Payer: Self-pay

## 2020-05-09 ENCOUNTER — Ambulatory Visit (INDEPENDENT_AMBULATORY_CARE_PROVIDER_SITE_OTHER): Payer: Medicare Other | Admitting: Dermatology

## 2020-05-09 DIAGNOSIS — D229 Melanocytic nevi, unspecified: Secondary | ICD-10-CM

## 2020-05-09 DIAGNOSIS — Z1283 Encounter for screening for malignant neoplasm of skin: Secondary | ICD-10-CM

## 2020-05-09 DIAGNOSIS — D692 Other nonthrombocytopenic purpura: Secondary | ICD-10-CM

## 2020-05-09 DIAGNOSIS — D225 Melanocytic nevi of trunk: Secondary | ICD-10-CM

## 2020-05-09 DIAGNOSIS — L821 Other seborrheic keratosis: Secondary | ICD-10-CM | POA: Diagnosis not present

## 2020-05-09 NOTE — Telephone Encounter (Signed)
I think it would be reasonable after review of records to take the ibuprofen 600 mg 3 times a day with food for the next 10 days.  This will give him approximately 14 days of therapy.  I am glad he is feeling better.  It is also reassuring that C-reactive protein or CRP was normal.  Candee Furbish, MD

## 2020-05-09 NOTE — Telephone Encounter (Signed)
Pt has not been seen since 09/2018.  Records from recent hospital visit in encounters - suggests pericarditis, take ibuprofen and f/u with cards.  Will have MD to review and I will call pt back.

## 2020-05-09 NOTE — Telephone Encounter (Signed)
Pt has been added to the schedule in the AM at 8:40.

## 2020-05-09 NOTE — Telephone Encounter (Signed)
[name] is calling stating he was recently seen at the hospital while out of town and they advised him to follow up with his cardiology as soon as possible. I advised [name] I do not have anything available before August and he requested I send a message. He states the hospital advised him to take 600 MG ibuprofen three times a day and stated his cardiologist can determine how long he should stay on it like this. Please advise.

## 2020-05-09 NOTE — Telephone Encounter (Signed)
Spoke with pt who is aware of information below.  Per Dr Marlou Porch

## 2020-05-09 NOTE — Patient Instructions (Addendum)
Annual skin examination for [name] who has a history of nonmelanoma skin cancer.  From legs to the top of the scalp currently there is no new or recurrent skin cancer and no atypical moles.  He has an uncountable number of benign keratoses on his back.  There is similar more subtle lesions on his sides and on his left lower leg.  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  The more pink textured spots on the left cheekbone (largest), right cheekbone, and left thigh could be solar keratoses but are currently stable and not bothersome and he is just recently recovering from hospitalization in [place] for shortness of breath and I think we can leave these lesions alone for now.  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  There is unfortunately no true cure but he may try a nonprescription product called Dermend daily after bathing.  If there is no reduction in bruising with 1 tube he should not keep trying this.  Routine follow-up in 1 year.

## 2020-05-10 ENCOUNTER — Encounter: Payer: Self-pay | Admitting: Dermatology

## 2020-05-10 ENCOUNTER — Other Ambulatory Visit: Payer: Medicare Other | Admitting: Orthotics

## 2020-05-10 ENCOUNTER — Ambulatory Visit (INDEPENDENT_AMBULATORY_CARE_PROVIDER_SITE_OTHER): Payer: Medicare Other | Admitting: Cardiology

## 2020-05-10 ENCOUNTER — Encounter: Payer: Self-pay | Admitting: Cardiology

## 2020-05-10 VITALS — BP 110/70 | HR 99 | Ht 72.0 in | Wt 209.0 lb

## 2020-05-10 DIAGNOSIS — I4891 Unspecified atrial fibrillation: Secondary | ICD-10-CM | POA: Diagnosis not present

## 2020-05-10 DIAGNOSIS — I471 Supraventricular tachycardia, unspecified: Secondary | ICD-10-CM

## 2020-05-10 DIAGNOSIS — I300 Acute nonspecific idiopathic pericarditis: Secondary | ICD-10-CM

## 2020-05-10 MED ORDER — APIXABAN 5 MG PO TABS: 5.0000 mg | ORAL_TABLET | Freq: Two times a day (BID) | ORAL | 6 refills | Status: DC

## 2020-05-10 MED ORDER — METOPROLOL SUCCINATE ER 50 MG PO TB24
50.0000 mg | ORAL_TABLET | Freq: Every day | ORAL | 3 refills | Status: DC
Start: 2020-05-10 — End: 2020-08-04

## 2020-05-10 NOTE — Patient Instructions (Signed)
Medication Instructions:  Please discontinue your Asprin and Ibuprofen. Start Eliquis 5 mg twice a day. Start Metoprolol succinate 50 mg once a day. Continue all other medications as listed.  *If you need a refill on your cardiac medications before your next appointment, please call your pharmacy*  Follow-Up: At [facility], you and your health needs are our priority.  As part of our continuing mission to provide you with exceptional heart care, we have created designated Provider Care Teams.  These Care Teams include your primary Cardiologist (physician) and Advanced Practice Providers (APPs -  Physician Assistants and Nurse Practitioners) who all work together to provide you with the care you need, when you need it.  We recommend signing up for the patient portal called "MyChart".  Sign up information is provided on this After Visit Summary.  MyChart is used to connect with patients for Virtual Visits (Telemedicine).  Patients are able to view lab/test results, encounter notes, upcoming appointments, etc.  Non-urgent messages can be sent to your provider as well.   To learn more about what you can do with MyChart, go to [email or web address].    Your next appointment:   2-3 week(s)  The format for your next appointment:   In Person  Provider:   You may see Candee Furbish, MD or one of the following Advanced Practice Providers on your designated Care Team:    Truitt Merle, NP  Cecilie Kicks, NP  Kathyrn Drown, NP    Thank you for choosing [facility]!!     Atrial Fibrillation  Atrial fibrillation is a type of heartbeat that is irregular or fast. If you have this condition, your heart beats without any order. This makes it hard for your heart to pump blood in a normal way. Atrial fibrillation may come and go, or it may become a long-lasting problem. If this condition is not treated, it can put you at higher risk for stroke, heart failure, and other heart  problems. What are the causes? This condition may be caused by diseases that damage the heart. They include:  High blood pressure.  Heart failure.  Heart valve disease.  Heart surgery. Other causes include:  Diabetes.  Thyroid disease.  Being overweight.  Kidney disease. Sometimes the cause is not known. What increases the risk? You are more likely to develop this condition if:  You are older.  You smoke.  You exercise often and very hard.  You have a family history of this condition.  You are a man.  You use drugs.  You drink a lot of alcohol.  You have lung conditions, such as emphysema, pneumonia, or COPD.  You have sleep apnea. What are the signs or symptoms? Common symptoms of this condition include:  A feeling that your heart is beating very fast.  Chest pain or discomfort.  Feeling short of breath.  Suddenly feeling light-headed or weak.  Getting tired easily during activity.  Fainting.  Sweating. In some cases, there are no symptoms. How is this treated? Treatment for this condition depends on underlying conditions and how you feel when you have atrial fibrillation. They include:  Medicines to: ? Prevent blood clots. ? Treat heart rate or heart rhythm problems.  Using devices, such as a pacemaker, to correct heart rhythm problems.  Doing surgery to remove the part of the heart that sends bad signals.  Closing an area where clots can form in the heart (left atrial appendage). In some cases, your doctor will treat other underlying  conditions. Follow these instructions at home: Medicines  Take over-the-counter and prescription medicines only as told by your doctor.  Do not take any new medicines without first talking to your doctor.  If you are taking blood thinners: ? Talk with your doctor before you take any medicines that have aspirin or NSAIDs, such as ibuprofen, in them. ? Take your medicine exactly as told by your doctor. Take  it at the same time each day. ? Avoid activities that could hurt or bruise you. Follow instructions about how to prevent falls. ? Wear a bracelet that says you are taking blood thinners. Or, carry a card that lists what medicines you take. Lifestyle      Do not use any products that have nicotine or tobacco in them. These include cigarettes, e-cigarettes, and chewing tobacco. If you need help quitting, ask your doctor.  Eat heart-healthy foods. Talk with your doctor about the right eating plan for you.  Exercise regularly as told by your doctor.  Do not drink alcohol.  Lose weight if you are overweight.  Do not use drugs, including cannabis. General instructions  If you have a condition that causes breathing to stop for a short period of time (apnea), treat it as told by your doctor.  Keep a healthy weight. Do not use diet pills unless your doctor says they are safe for you. Diet pills may make heart problems worse.  Keep all follow-up visits as told by your doctor. This is important. Contact a doctor if:  You notice a change in the speed, rhythm, or strength of your heartbeat.  You are taking a blood-thinning medicine and you get more bruising.  You get tired more easily when you move or exercise.  You have a sudden change in weight. Get help right away if:   You have pain in your chest or your belly (abdomen).  You have trouble breathing.  You have side effects of blood thinners, such as blood in your vomit, poop (stool), or pee (urine), or bleeding that cannot stop.  You have any signs of a stroke. "BE FAST" is an easy way to remember the main warning signs: ? B - Balance. Signs are dizziness, sudden trouble walking, or loss of balance. ? E - Eyes. Signs are trouble seeing or a change in how you see. ? F - Face. Signs are sudden weakness or loss of feeling in the face, or the face or eyelid drooping on one side. ? A - Arms. Signs are weakness or loss of feeling in  an arm. This happens suddenly and usually on one side of the body. ? S - Speech. Signs are sudden trouble speaking, slurred speech, or trouble understanding what people say. ? T - Time. Time to call emergency services. Write down what time symptoms started.  You have other signs of a stroke, such as: ? A sudden, very bad headache with no known cause. ? Feeling like you may vomit (nausea). ? Vomiting. ? A seizure. These symptoms may be an emergency. Do not wait to see if the symptoms will go away. Get medical help right away. Call your local emergency services (911 in the [place] Do not drive yourself to the hospital. Summary  Atrial fibrillation is a type of heartbeat that is irregular or fast.  You are at higher risk of this condition if you smoke, are older, have diabetes, or are overweight.  Follow your doctor's instructions about medicines, diet, exercise, and follow-up visits.  Get help  right away if you have signs or symptoms of a stroke.  Get help right away if you cannot catch your breath, or you have chest pain or discomfort. This information is not intended to replace advice given to you by your health care provider. Make sure you discuss any questions you have with your health care provider. Document Revised: 04/08/2019 Document Reviewed: 04/08/2019 Elsevier Patient Education  [place].  Apixaban oral tablets What is this medicine? APIXABAN (a PIX a ban) is an anticoagulant (blood thinner). It is used to lower the chance of stroke in people with a medical condition called atrial fibrillation. It is also used to treat or prevent blood clots in the lungs or in the veins. This medicine may be used for other purposes; ask your health care provider or pharmacist if you have questions. COMMON BRAND NAME(S): Eliquis What should I tell my health care provider before I take this medicine? They need to know if you have any of these conditions:  antiphospholipid antibody  syndrome  bleeding disorders  bleeding in the brain  blood in your stools (black or tarry stools) or if you have blood in your vomit  history of blood clots  history of stomach bleeding  kidney disease  liver disease  mechanical heart valve  an unusual or allergic reaction to apixaban, other medicines, foods, dyes, or preservatives  pregnant or trying to get pregnant  breast-feeding How should I use this medicine? Take this medicine by mouth with a glass of water. Follow the directions on the prescription label. You can take it with or without food. If it upsets your stomach, take it with food. Take your medicine at regular intervals. Do not take it more often than directed. Do not stop taking except on your doctor's advice. Stopping this medicine may increase your risk of a blood clot. Be sure to refill your prescription before you run out of medicine. Talk to your pediatrician regarding the use of this medicine in children. Special care may be needed. Overdosage: If you think you have taken too much of this medicine contact a poison control center or emergency room at once. NOTE: This medicine is only for you. Do not share this medicine with others. What if I miss a dose? If you miss a dose, take it as soon as you can. If it is almost time for your next dose, take only that dose. Do not take double or extra doses. What may interact with this medicine? This medicine may interact with the following:  aspirin and aspirin-like medicines  certain medicines for fungal infections like ketoconazole and itraconazole  certain medicines for seizures like carbamazepine and phenytoin  certain medicines that treat or prevent blood clots like warfarin, enoxaparin, and dalteparin  clarithromycin  NSAIDs, medicines for pain and inflammation, like ibuprofen or naproxen  rifampin  ritonavir  St. John's wort This list may not describe all possible interactions. Give your health care  provider a list of all the medicines, herbs, non-prescription drugs, or dietary supplements you use. Also tell them if you smoke, drink alcohol, or use illegal drugs. Some items may interact with your medicine. What should I watch for while using this medicine? Visit your healthcare professional for regular checks on your progress. You may need blood work done while you are taking this medicine. Your condition will be monitored carefully while you are receiving this medicine. It is important not to miss any appointments. Avoid sports and activities that might cause injury while  you are using this medicine. Severe falls or injuries can cause unseen bleeding. Be careful when using sharp tools or knives. Consider using an Copy. Take special care brushing or flossing your teeth. Report any injuries, bruising, or red spots on the skin to your healthcare professional. If you are going to need surgery or other procedure, tell your healthcare professional that you are taking this medicine. Wear a medical ID bracelet or chain. Carry a card that describes your disease and details of your medicine and dosage times. What side effects may I notice from receiving this medicine? Side effects that you should report to your doctor or health care professional as soon as possible:  allergic reactions like skin rash, itching or hives, swelling of the face, lips, or tongue  signs and symptoms of bleeding such as bloody or black, tarry stools; red or dark-brown urine; spitting up blood or brown material that looks like coffee grounds; red spots on the skin; unusual bruising or bleeding from the eye, gums, or nose  signs and symptoms of a blood clot such as chest pain; shortness of breath; pain, swelling, or warmth in the leg  signs and symptoms of a stroke such as changes in vision; confusion; trouble speaking or understanding; severe headaches; sudden numbness or weakness of the face, arm or leg; trouble  walking; dizziness; loss of coordination This list may not describe all possible side effects. Call your doctor for medical advice about side effects. You may report side effects to FDA at 1-800-FDA-1088. Where should I keep my medicine? Keep out of the reach of children. Store at room temperature between 20 and 25 degrees C (68 and 77 degrees F). Throw away any unused medicine after the expiration date. NOTE: This sheet is a summary. It may not cover all possible information. If you have questions about this medicine, talk to your doctor, pharmacist, or health care provider.  2020 Elsevier/Gold Standard (2018-06-25 17:39:34)

## 2020-05-10 NOTE — Progress Notes (Signed)
   Follow-Up Visit   Subjective  [name] is a 79 y.o. male who presents for the following: Annual Exam (face, left neck & left lower leg- no itch no bleed).  General skin examination Location:  Duration:  Quality:  Associated Signs/Symptoms: Modifying Factors:  Severity:  Timing: Context: History of nonmelanoma skin cancers  Objective  Well appearing patient in no apparent distress; mood and affect are within normal limits.  A full examination was performed including scalp, head, eyes, ears, nose, lips, neck, chest, axillae, abdomen, back, buttocks, bilateral upper extremities, bilateral lower extremities, hands, feet, fingers, toes, fingernails, and toenails. All findings within normal limits unless otherwise noted below.   Assessment & Plan   Patient Instructions  Annual skin examination for [name] who has a history of nonmelanoma skin cancer.  From legs to the top of the scalp currently there is no new or recurrent skin cancer and no atypical moles.  He has an uncountable number of benign keratoses on his back.  There is similar more subtle lesions on his sides and on his left lower leg.  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  The more pink textured spots on the left cheekbone (largest), right cheekbone, and left thigh could be solar keratoses but are currently stable and not bothersome and he is just recently recovering from hospitalization in [place] for shortness of breath and I think we can leave these lesions alone for now.  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  There is unfortunately no true cure but he may try a nonprescription product called Dermend daily after bathing.  If there is no reduction in bruising with 1 tube he should not keep trying this.  Routine follow-up in 1 year.   Nevus Mid Back  Annual skin examination.  Continued sun protection.  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  Skin cancer screening performed today.    I, Lavonna Monarch, MD, have reviewed all documentation for this visit.  The documentation on 05/10/20 for the exam, diagnosis, procedures, and orders are all accurate and complete.

## 2020-05-10 NOTE — Progress Notes (Signed)
Cardiology Office Note:    Date:  05/10/2020   ID:  [name], DOB 06/03/1941, MRN [id number]  PCP:  Hulan Fess, MD  [facility] Cardiologist:  Candee Furbish, MD  [facility] Electrophysiologist:  None   Referring MD: Hulan Fess, MD     History of Present Illness:    [name] is a 79 y.o. male here for post hospital follow-up following admission on 05/03/2020 secondary to "painful breathing "diagnosed with pericarditis in [place].  CRP was normal.  Past medical history of myasthenia gravis COPD PSVT hyperlipidemia BPH.  Had sudden onset shortness of breath and chest pain.  Worse with deep inspiration.  Traveled by airplane to [place].  Troponins were negative x3, EKG did not show any acute changes.  CT angiogram of chest showed no evidence of pulmonary embolism.  There was a small pericardial effusion noted.  Echocardiogram was performed and showed EF of 55% with no wall motion abnormality and normal diastolic function with small pericardial effusion.  EKG on 7/7 showed new ST elevation in the inferior leads.  Repeat troponin was negative.  Cardiology was consulted for concern of pericarditis but CRP was negative.  He was started on ibuprofen and did not wish to take colchicine because of side effects.  Chest pain had resolved.  Plan was to continue for ibuprofen for duration.  White count was 12, procalcitonin was 0.13.  PCR Covid negative.  Urine strep and Legionella negative.  He was also treated with azithromycin and Rocephin for 4 days.  Stopped on discharge.  Had some bradycardia and hypotension on presentation with systolic blood pressure in the 60s.  2 L of IV fluid and stress dose steroids were given in the emergency room.  Blood pressure normalized.  No further episode.  He is on chronic steroids for myasthenia gravis.  10 mg of prednisone and pyridostigmine  Hepatic hypodensities were incidental finding on CT scan likely  representing cyst.  LFTs were fine.  Currently he is feeling fine, no chest pain no pain with deep inspiration.  Interestingly, EKG today shows atrial fibrillation.  He has been experiencing mild shortness of breath consistently with cough and mucus production.  He is seeing pulmonary.  Past Medical History:  Diagnosis Date  . Abscess of sigmoid colon due to diverticulitis 12/26/2018  . Appendicitis 09/05/2014  . Basal cell carcinoma 10/04/2015   left [id number]  . BCC (basal cell carcinoma) 11/03/2019   left shin (txpbx)  . Colon polyp   . Complication of anesthesia    Difficult to arouse, Combative x 1  . Diverticulitis   . Diverticulosis    extensive 3'15.  . Dysrhythmia    hx. Ablation for tachycardia, no routine cardiology visits now  . ED (erectile dysfunction)   . Heart murmur    teenager  . History of colon polyps   . History of kidney stones    x1 passed  . Hyperlipidemia   . Myasthenia gravis ([place])   . Neuromuscular disorder ([place])    "neuroma"   . Osteoarthritis   . Palpitation    with svt rate 150  . 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 Tendinitis    achiles heel spur  . Testicular atrophy   . Tubular adenoma of colon     Past Surgical History:  Procedure Laterality Date  . CARDIAC  ELECTROPHYSIOLOGY MAPPING AND ABLATION  2010   cardiac   . COLONOSCOPY  11/2018   Dr [name].  Many polyps.  rec f/u colonoscopy summer 2020  . EUS N/A 11/17/2014   Procedure: ESOPHAGEAL ENDOSCOPIC ULTRASOUND (EUS) RADIAL;  Surgeon: [name], MD;  Location: [facility];  Service: Endoscopy;  Laterality: N/A;  . FINE NEEDLE ASPIRATION N/A 11/17/2014   Procedure: FINE NEEDLE ASPIRATION (FNA) LINEAR;  Surgeon: [name], MD;  Location: [facility] END[facility];  Service: Endoscopy;  Laterality: N/A;  + or- fna  . FLEXIBLE SIGMOIDOSCOPY    .  HEMORRHOID SURGERY     banding   . INGUINAL HERNIA REPAIR Bilateral 2005   '05 bilateral hernia  . KNEE ARTHROSCOPY Left 2008   scope '08  . LAPAROSCOPIC APPENDECTOMY N/A 09/05/2014   Procedure: APPENDECTOMY LAPAROSCOPIC;  Surgeon: [name], MD;  Location: [facility];  Service: General;  Laterality: N/A;  . PROCTOSCOPY N/A 12/26/2018   Procedure: RIGID PROCTOSCOPY;  Surgeon: [name], MD;  Location: [facility];  Service: General;  Laterality: N/A;  . ROTATOR CUFF REPAIR Right 2016    Current Medications: Current Meds  Medication Sig  . albuterol (VENTOLIN HFA) 108 (90 Base) MCG/ACT inhaler Inhale 2 puffs into the lungs every 4 (four) hours as needed.  Marland Kitchen atorvastatin (LIPITOR) 10 MG tablet Take 10 mg by mouth daily.  . COMBIVENT RESPIMAT 20-100 MCG/ACT AERS respimat   . Cyanocobalamin (VITAMIN B-12 PO) Take 5,000 mcg by mouth daily.   Marland Kitchen doxazosin (CARDURA) 8 MG tablet Take 8 mg by mouth daily.  . famotidine (PEPCID) 20 MG tablet Take 20 mg by mouth 2 (two) times daily.  . finasteride (PROSCAR) 5 MG tablet Take 5 mg by mouth every morning.   . fluticasone (FLONASE) 50 MCG/ACT nasal spray Place 2 sprays into both nostrils daily.  Marland Kitchen glucosamine-chondroitin 500-400 MG tablet Take by mouth.  Marland Kitchen ipratropium (ATROVENT) 0.03 % nasal spray SMARTSIG:2 Spray(s) Both Nares Every 12 Hours  . montelukast (SINGULAIR) 10 MG tablet Take 10 mg by mouth at bedtime.  . Multiple Vitamin (MULTIVITAMIN WITH MINERALS) TABS tablet Take 1 tablet by mouth daily.  . mupirocin ointment (BACTROBAN) 2 % APPLY TO AFFECTED AREA EVERY DAY  . predniSONE (DELTASONE) 10 MG tablet Take 1 tablet (10 mg total) by mouth daily with breakfast.  . Psyllium (QC NATURAL VEGETABLE) 95 % POWD Take by mouth.  . pyridostigmine (MESTINON) 60 MG tablet TAKE 0.5 TABLETS (30 MG TOTAL) BY MOUTH 3 (THREE) TIMES DAILY. TAKE HALF TABLET AT 8AM, 1PM, AND 6PM  . sodium chloride (OCEAN) 0.65 % SOLN nasal spray Place 1 spray into both nostrils  as needed for congestion.  . traMADol (ULTRAM) 50 MG tablet Take 25-50 mg by mouth 2 (two) times daily as needed.  . [DISCONTINUED] aspirin 81 MG tablet Take 1 tablet (81 mg total) by mouth daily.  . [DISCONTINUED] ibuprofen (ADVIL) 600 MG tablet Take 600 mg by mouth 3 (three) times daily.     Allergies:   Ambrosia trifida (tall ragweed) allergy skin test, Bee pollen, and Pollen extract   Social History   Socioeconomic History  . Marital status: Married    Spouse name: Not on file  . Number of children: 3  . Years of education: 16  . Highest education level: Some college, no degree  Occupational History  . Occupation: sales-Retired, works one day a week  Tobacco Use  . Smoking status: Former Smoker    Packs/day: 1.00    Years: 47.00  Pack years: 47.00    Types: Cigarettes    Quit date: 10/29/2000    Years since quitting: 19.5  . Smokeless tobacco: Never Used  Vaping Use  . Vaping Use: Never used  Substance and Sexual Activity  . Alcohol use: Yes    Alcohol/week: 1.0 standard drink    Types: 1 Standard drinks or equivalent per week    Comment: socially- nightly 1 cocktail  . Drug use: No  . Sexual activity: Not on file  Other Topics Concern  . Not on file  Social History Narrative   Lives with wife in a one story home with a basement.  Has 3 daughters.  Works one day a week at the [employer].  Retired from Press [employer].  Education: some college.    Caffeine use: decaf   Right handed   One story home   Social Determinants of Health   Financial Resource Strain:   . Difficulty of Paying Living Expenses:   Food Insecurity:   . Worried About Charity fundraiser in the Last Year:   . Arboriculturist in the Last Year:   Transportation Needs:   . Film/video editor (Medical):   Marland Kitchen Lack of Transportation (Non-Medical):   Physical Activity:   . Days of Exercise per Week:   . Minutes of Exercise per Session:   Stress:   . Feeling of Stress :   Social Connections:   .  Frequency of Communication with Friends and Family:   . Frequency of Social Gatherings with Friends and Family:   . Attends Religious Services:   . Active Member of Clubs or Organizations:   . Attends Archivist Meetings:   Marland Kitchen Marital Status:      Family History: The patient's family history includes Allergies in his brother; Aneurysm in his brother; Pancreatic cancer in his father; Stroke in his mother. There is no history of Neuropathy.  ROS:   Please see the history of present illness.     All other systems reviewed and are negative.  EKGs/Labs/Other Studies Reviewed:    The following studies were reviewed today: As above  EKG:  EKG is  ordered today.  The ekg ordered today demonstrates 99 bpm atrial fibrillation subtle persistent ST elevation in the inferior leads, 2 3 aVF, subtle precordial leads.  Recent Labs: No results found for requested labs within last 8760 hours.  Recent Lipid Panel    Component Value Date/Time   CHOL 138 06/14/2018 1605   TRIG 71 06/14/2018 1605   HDL 48 06/14/2018 1605   CHOLHDL 2.9 06/14/2018 1605   VLDL 14 06/14/2018 1605   LDLCALC 76 06/14/2018 1605    Physical Exam:    VS:  BP 110/70   Pulse 99   Ht 6' (1.829 m)   Wt 209 lb (94.8 kg)   BMI 28.35 kg/m     Wt Readings from Last 3 Encounters:  05/10/20 209 lb (94.8 kg)  01/25/20 195 lb (88.5 kg)  09/18/19 207 lb (93.9 kg)     GEN:  Well nourished, well developed in no acute distress HEENT: Normal NECK: No JVD; No carotid bruits LYMPHATICS: No lymphadenopathy CARDIAC: irreg irreg, no murmurs, rubs, gallops RESPIRATORY:  Clear to auscultation without rales, wheezing or rhonchi  ABDOMEN: Soft, non-tender, non-distended MUSCULOSKELETAL:  No edema; No deformity  SKIN: Warm and dry NEUROLOGIC:  Alert and oriented x 3 PSYCHIATRIC:  Normal affect   ASSESSMENT:    1. Acute idiopathic pericarditis  2. PSVT (paroxysmal supraventricular tachycardia) ([place])   3. Atrial  fibrillation, unspecified type ([place])    PLAN:    In order of problems listed above:  New onset paroxysmal atrial fibrillation -This is newly discovered.  This was not discovered during the recent hospitalization in [place].  EKG today clearly shows atrial fibrillation heart rate 99 bpm. -We will give him Toprol-XL 50 mg once a day to slightly improve rate control.  This should have minimal effects on blood pressure. -I will also start Eliquis 5 mg twice a day.  Watch for any signs of bleeding. -Recent echocardiogram performed last week shows no evidence of cardiomyopathy. -It is possible that his acute inflammation/pericarditis/pneumonia triggered this.  In 2 weeks we will have him come back to the clinic for repeat EKG.  If he is still in atrial fibrillation, we will go ahead and set him up for cardioversion after 3 weeks of continuous Eliquis. -Continue to monitor CBC and basic metabolic profile.  Carefully instructed he and his daughter for any signs of bleeding monitoring.  His mother did have a stroke.   Acute pericarditis on 05/03/2020 in [place] -Initially presented with shortness of breath hypotension relieved with IV fluids and stress dose steroids, second day with ST elevation in inferior leads, small pericardial effusion noted on CT scan of chest as well as echocardiogram with normal function otherwise.  Troponins were negative.  CRP was negative. - Did not wish to utilize colchicine because of potential side effects.  He has completed 7 days of ibuprofen.  Since we are going to start Eliquis because of his new onset atrial fibrillation I think it makes sense for him to stop the ibuprofen to reduce overall bleeding risks.  Since he is not having any chest discomfort or pleuritic type pain, I think this seems reasonable. -Agree with Pepcid . -As stated above normally we would continue the ibuprofen however I think that we should transition over to Eliquis given this new discovery  of atrial fibrillation for stroke prevention. -Given his low CRP, lack of any inflammatory type symptoms, I think any chance of pericardial hemorrhage is low at this point.  PSVT -Stable, Dr. Crissie Sickles of EP catheter ablation in 2012.  Myasthenia gravis -Stable.  Prednisone.  Dr. [name].  History of diverticulitis -Status post bowel resection, Dr. [name]  Family history of CAD -One brother died of stroke and had CAD  COPD -Emphysematous changes noted on CT scan previously.  Former smoker -Several pack-year history but quit many years ago  40 minutes spent with review of medical records from outside hospital review of reports, discussion with patient and daughter, documentation   Medication Adjustments/Labs and Tests Ordered: Current medicines are reviewed at length with the patient today.  Concerns regarding medicines are outlined above.  Orders Placed This Encounter  Procedures  . EKG 12-Lead   Meds ordered this encounter  Medications  . apixaban (ELIQUIS) 5 MG TABS tablet    Sig: Take 1 tablet (5 mg total) by mouth 2 (two) times daily.    Dispense:  60 tablet    Refill:  6  . metoprolol succinate (TOPROL-XL) 50 MG 24 hr tablet    Sig: Take 1 tablet (50 mg total) by mouth daily. Take with or immediately following a meal.    Dispense:  90 tablet    Refill:  3    Patient Instructions  Medication Instructions:  Please discontinue your Asprin and Ibuprofen. Start Eliquis 5 mg twice a day. Start  Metoprolol succinate 50 mg once a day. Continue all other medications as listed.  *If you need a refill on your cardiac medications before your next appointment, please call your pharmacy*  Follow-Up: At [facility], you and your health needs are our priority.  As part of our continuing mission to provide you with exceptional heart care, we have created designated Provider Care Teams.  These Care Teams include your primary Cardiologist (physician) and Advanced Practice  Providers (APPs -  Physician Assistants and Nurse Practitioners) who all work together to provide you with the care you need, when you need it.  We recommend signing up for the patient portal called "MyChart".  Sign up information is provided on this After Visit Summary.  MyChart is used to connect with patients for Virtual Visits (Telemedicine).  Patients are able to view lab/test results, encounter notes, upcoming appointments, etc.  Non-urgent messages can be sent to your provider as well.   To learn more about what you can do with MyChart, go to [email or web address].    Your next appointment:   2-3 week(s)  The format for your next appointment:   In Person  Provider:   You may see Candee Furbish, MD or one of the following Advanced Practice Providers on your designated Care Team:    Truitt Merle, NP  Cecilie Kicks, NP  Kathyrn Drown, NP    Thank you for choosing [facility]!!     Atrial Fibrillation  Atrial fibrillation is a type of heartbeat that is irregular or fast. If you have this condition, your heart beats without any order. This makes it hard for your heart to pump blood in a normal way. Atrial fibrillation may come and go, or it may become a long-lasting problem. If this condition is not treated, it can put you at higher risk for stroke, heart failure, and other heart problems. What are the causes? This condition may be caused by diseases that damage the heart. They include:  High blood pressure.  Heart failure.  Heart valve disease.  Heart surgery. Other causes include:  Diabetes.  Thyroid disease.  Being overweight.  Kidney disease. Sometimes the cause is not known. What increases the risk? You are more likely to develop this condition if:  You are older.  You smoke.  You exercise often and very hard.  You have a family history of this condition.  You are a man.  You use drugs.  You drink a lot of alcohol.  You have lung  conditions, such as emphysema, pneumonia, or COPD.  You have sleep apnea. What are the signs or symptoms? Common symptoms of this condition include:  A feeling that your heart is beating very fast.  Chest pain or discomfort.  Feeling short of breath.  Suddenly feeling light-headed or weak.  Getting tired easily during activity.  Fainting.  Sweating. In some cases, there are no symptoms. How is this treated? Treatment for this condition depends on underlying conditions and how you feel when you have atrial fibrillation. They include:  Medicines to: ? Prevent blood clots. ? Treat heart rate or heart rhythm problems.  Using devices, such as a pacemaker, to correct heart rhythm problems.  Doing surgery to remove the part of the heart that sends bad signals.  Closing an area where clots can form in the heart (left atrial appendage). In some cases, your doctor will treat other underlying conditions. Follow these instructions at home: Medicines  Take over-the-counter and prescription medicines only as told  by your doctor.  Do not take any new medicines without first talking to your doctor.  If you are taking blood thinners: ? Talk with your doctor before you take any medicines that have aspirin or NSAIDs, such as ibuprofen, in them. ? Take your medicine exactly as told by your doctor. Take it at the same time each day. ? Avoid activities that could hurt or bruise you. Follow instructions about how to prevent falls. ? Wear a bracelet that says you are taking blood thinners. Or, carry a card that lists what medicines you take. Lifestyle      Do not use any products that have nicotine or tobacco in them. These include cigarettes, e-cigarettes, and chewing tobacco. If you need help quitting, ask your doctor.  Eat heart-healthy foods. Talk with your doctor about the right eating plan for you.  Exercise regularly as told by your doctor.  Do not drink alcohol.  Lose weight  if you are overweight.  Do not use drugs, including cannabis. General instructions  If you have a condition that causes breathing to stop for a short period of time (apnea), treat it as told by your doctor.  Keep a healthy weight. Do not use diet pills unless your doctor says they are safe for you. Diet pills may make heart problems worse.  Keep all follow-up visits as told by your doctor. This is important. Contact a doctor if:  You notice a change in the speed, rhythm, or strength of your heartbeat.  You are taking a blood-thinning medicine and you get more bruising.  You get tired more easily when you move or exercise.  You have a sudden change in weight. Get help right away if:   You have pain in your chest or your belly (abdomen).  You have trouble breathing.  You have side effects of blood thinners, such as blood in your vomit, poop (stool), or pee (urine), or bleeding that cannot stop.  You have any signs of a stroke. "BE FAST" is an easy way to remember the main warning signs: ? B - Balance. Signs are dizziness, sudden trouble walking, or loss of balance. ? E - Eyes. Signs are trouble seeing or a change in how you see. ? F - Face. Signs are sudden weakness or loss of feeling in the face, or the face or eyelid drooping on one side. ? A - Arms. Signs are weakness or loss of feeling in an arm. This happens suddenly and usually on one side of the body. ? S - Speech. Signs are sudden trouble speaking, slurred speech, or trouble understanding what people say. ? T - Time. Time to call emergency services. Write down what time symptoms started.  You have other signs of a stroke, such as: ? A sudden, very bad headache with no known cause. ? Feeling like you may vomit (nausea). ? Vomiting. ? A seizure. These symptoms may be an emergency. Do not wait to see if the symptoms will go away. Get medical help right away. Call your local emergency services (911 in the [place] Do not  drive yourself to the hospital. Summary  Atrial fibrillation is a type of heartbeat that is irregular or fast.  You are at higher risk of this condition if you smoke, are older, have diabetes, or are overweight.  Follow your doctor's instructions about medicines, diet, exercise, and follow-up visits.  Get help right away if you have signs or symptoms of a stroke.  Get help right away  if you cannot catch your breath, or you have chest pain or discomfort. This information is not intended to replace advice given to you by your health care provider. Make sure you discuss any questions you have with your health care provider. Document Revised: 04/08/2019 Document Reviewed: 04/08/2019 Elsevier Patient Education  [place].  Apixaban oral tablets What is this medicine? APIXABAN (a PIX a ban) is an anticoagulant (blood thinner). It is used to lower the chance of stroke in people with a medical condition called atrial fibrillation. It is also used to treat or prevent blood clots in the lungs or in the veins. This medicine may be used for other purposes; ask your health care provider or pharmacist if you have questions. COMMON BRAND NAME(S): Eliquis What should I tell my health care provider before I take this medicine? They need to know if you have any of these conditions:  antiphospholipid antibody syndrome  bleeding disorders  bleeding in the brain  blood in your stools (black or tarry stools) or if you have blood in your vomit  history of blood clots  history of stomach bleeding  kidney disease  liver disease  mechanical heart valve  an unusual or allergic reaction to apixaban, other medicines, foods, dyes, or preservatives  pregnant or trying to get pregnant  breast-feeding How should I use this medicine? Take this medicine by mouth with a glass of water. Follow the directions on the prescription label. You can take it with or without food. If it upsets your  stomach, take it with food. Take your medicine at regular intervals. Do not take it more often than directed. Do not stop taking except on your doctor's advice. Stopping this medicine may increase your risk of a blood clot. Be sure to refill your prescription before you run out of medicine. Talk to your pediatrician regarding the use of this medicine in children. Special care may be needed. Overdosage: If you think you have taken too much of this medicine contact a poison control center or emergency room at once. NOTE: This medicine is only for you. Do not share this medicine with others. What if I miss a dose? If you miss a dose, take it as soon as you can. If it is almost time for your next dose, take only that dose. Do not take double or extra doses. What may interact with this medicine? This medicine may interact with the following:  aspirin and aspirin-like medicines  certain medicines for fungal infections like ketoconazole and itraconazole  certain medicines for seizures like carbamazepine and phenytoin  certain medicines that treat or prevent blood clots like warfarin, enoxaparin, and dalteparin  clarithromycin  NSAIDs, medicines for pain and inflammation, like ibuprofen or naproxen  rifampin  ritonavir  St. John's wort This list may not describe all possible interactions. Give your health care provider a list of all the medicines, herbs, non-prescription drugs, or dietary supplements you use. Also tell them if you smoke, drink alcohol, or use illegal drugs. Some items may interact with your medicine. What should I watch for while using this medicine? Visit your healthcare professional for regular checks on your progress. You may need blood work done while you are taking this medicine. Your condition will be monitored carefully while you are receiving this medicine. It is important not to miss any appointments. Avoid sports and activities that might cause injury while you are  using this medicine. Severe falls or injuries can cause unseen bleeding. Be careful when  using sharp tools or knives. Consider using an Copy. Take special care brushing or flossing your teeth. Report any injuries, bruising, or red spots on the skin to your healthcare professional. If you are going to need surgery or other procedure, tell your healthcare professional that you are taking this medicine. Wear a medical ID bracelet or chain. Carry a card that describes your disease and details of your medicine and dosage times. What side effects may I notice from receiving this medicine? Side effects that you should report to your doctor or health care professional as soon as possible:  allergic reactions like skin rash, itching or hives, swelling of the face, lips, or tongue  signs and symptoms of bleeding such as bloody or black, tarry stools; red or dark-brown urine; spitting up blood or brown material that looks like coffee grounds; red spots on the skin; unusual bruising or bleeding from the eye, gums, or nose  signs and symptoms of a blood clot such as chest pain; shortness of breath; pain, swelling, or warmth in the leg  signs and symptoms of a stroke such as changes in vision; confusion; trouble speaking or understanding; severe headaches; sudden numbness or weakness of the face, arm or leg; trouble walking; dizziness; loss of coordination This list may not describe all possible side effects. Call your doctor for medical advice about side effects. You may report side effects to FDA at 1-800-FDA-1088. Where should I keep my medicine? Keep out of the reach of children. Store at room temperature between 20 and 25 degrees C (68 and 77 degrees F). Throw away any unused medicine after the expiration date. NOTE: This sheet is a summary. It may not cover all possible information. If you have questions about this medicine, talk to your doctor, pharmacist, or health care provider.  2020  Elsevier/Gold Standard (2018-06-25 17:39:34)      Signed, Candee Furbish, MD  05/10/2020 10:01 AM    [facility]

## 2020-05-13 ENCOUNTER — Telehealth: Payer: Self-pay | Admitting: Pharmacist

## 2020-05-13 MED ORDER — RIVAROXABAN 20 MG PO TABS
20.0000 mg | ORAL_TABLET | Freq: Every day | ORAL | 5 refills | Status: DC
Start: 2020-05-13 — End: 2020-06-13

## 2020-05-13 NOTE — Telephone Encounter (Signed)
Follow Up  Caryl Pina from [facility] is calling in to speak with pharmacist about patient's metoprolol for afib. Transferred to [employer].

## 2020-05-13 NOTE — Telephone Encounter (Addendum)
Spoke with pt who is willing to try Xarelto. Will send in rx for Xarelto 20mg  daily (CrCl > 173mL/min), pt is aware to take 1 tablet daily with largest meal of the day. He will stop his Eliquis (last dose was last night - did report feeling better) and will let us know if he has trouble tolerating the Xarelto. 1 month free copay card has been activated and sent to pharmacy.

## 2020-05-13 NOTE — Telephone Encounter (Signed)
-----   Message from Jerline Pain, MD sent at 05/13/2020  6:56 AM EDT ----- Regarding: RE: Eliquis question Unusual reaction.  Before Pradaxa or Coumadin, let's try Xarelto 20mg  PO QD.  Thanks  Candee Furbish, MD ----- Message ----- From: Leeroy Bock, RPH-CPP Sent: 05/12/2020  11:21 AM EDT To: Jerline Pain, MD Subject: Eliquis question                               Hi Dr Marlou Porch,  Please see MyChart message thread from pt today regarding Eliquis. I didn't initially think his symptoms were due to Eliquis, but the throat tightness he mentioned after taking his 2nd dose this AM sounds like it may be an allergic reaction. Thoughts on another blood thinner? Not sure if it would make more sense to try a DOAC with a different mechanism of action like Pradaxa instead before resorting to warfarin?  Sharlene Dory

## 2020-05-17 ENCOUNTER — Encounter: Payer: Self-pay | Admitting: Cardiology

## 2020-05-17 NOTE — Telephone Encounter (Signed)
Provided Xarelto 30 day trial card information to pharmacy tech at [facility].  Pt there waiting to pick up prescription.

## 2020-05-17 NOTE — Telephone Encounter (Signed)
error 

## 2020-05-17 NOTE — Telephone Encounter (Signed)
Pharmacy called in regards to mychart message for 1 month free copay card. Transferred to [employer]

## 2020-05-18 DIAGNOSIS — Z87891 Personal history of nicotine dependence: Secondary | ICD-10-CM | POA: Diagnosis not present

## 2020-05-18 DIAGNOSIS — R0982 Postnasal drip: Secondary | ICD-10-CM | POA: Diagnosis not present

## 2020-05-18 DIAGNOSIS — Z9181 History of falling: Secondary | ICD-10-CM | POA: Diagnosis not present

## 2020-05-18 DIAGNOSIS — R911 Solitary pulmonary nodule: Secondary | ICD-10-CM | POA: Diagnosis not present

## 2020-05-18 DIAGNOSIS — J449 Chronic obstructive pulmonary disease, unspecified: Secondary | ICD-10-CM | POA: Diagnosis not present

## 2020-05-18 IMAGING — MR MR HEAD W/O CM
9 of 11 series · 33 of 48 positions shown · non-contrast
Comparison: CT head 06/14/2018

CLINICAL DATA: Stroke follow-up.  Left facial droop

EXAM:
MRI HEAD WITHOUT CONTRAST
TECHNIQUE: Multiplanar, multiecho pulse sequences of the brain and surrounding
structures were obtained without intravenous contrast.

[Series 4: DWI · axial · 3.0mm · 0.94mm/px · z∈[-52,+124]mm · 8 of 119 slices shown (1 of 2)]
[im 1/119]
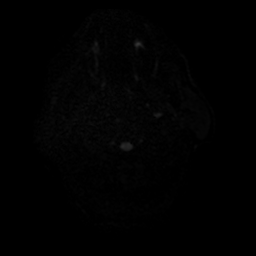
[im 17/119]
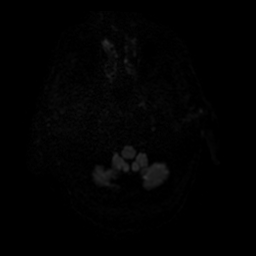
[im 34/119]
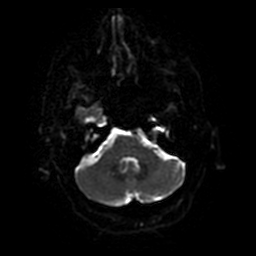
[im 51/119]
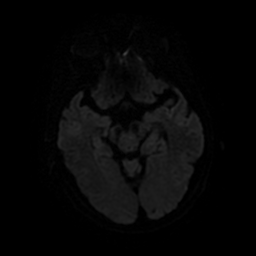
[im 68/119]
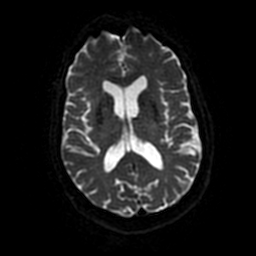
[im 85/119]
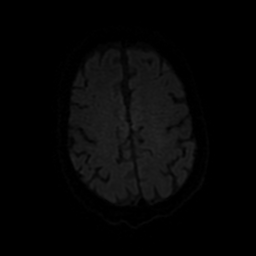
[im 102/119]
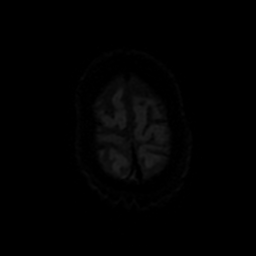
[im 119/119]
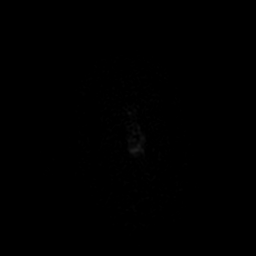

[Series 5: DWI · coronal · 4.0mm · 0.94mm/px · 5 of 76 slices shown (2 of 2)]
[im 1/76]
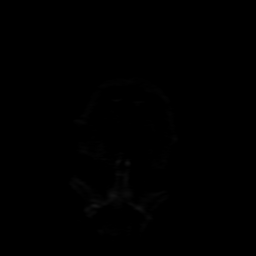
[im 19/76]
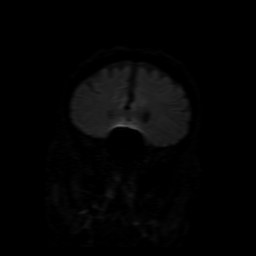
[im 38/76]
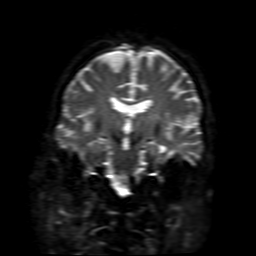
[im 57/76]
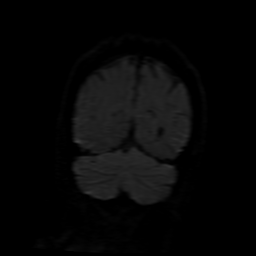
[im 76/76]
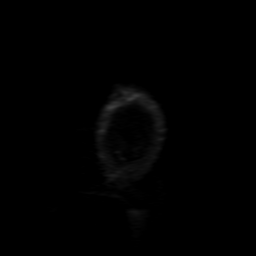

[Series 6: FLAIR · sagittal · 5.0mm · 0.47mm/px · 2 of 26 slices shown (1 of 2)]
[im 1/26]
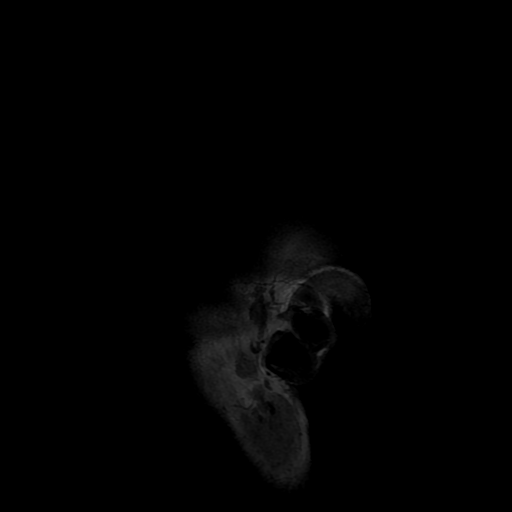
[im 26/26]
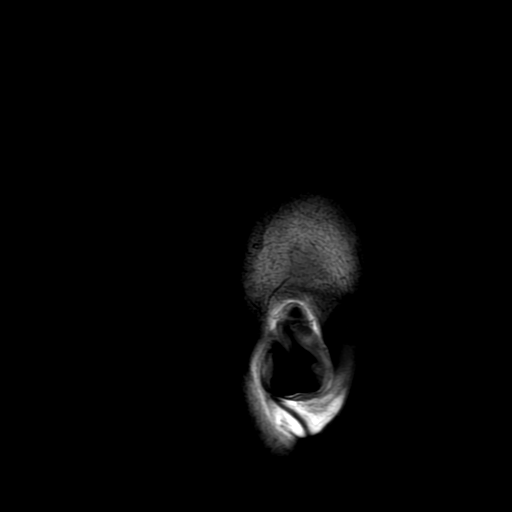

[Series 7: T2 · axial · 5.0mm · 0.47mm/px · z∈[-55,+117]mm · 2 of 30 slices shown (1 of 2)]
[im 1/30]
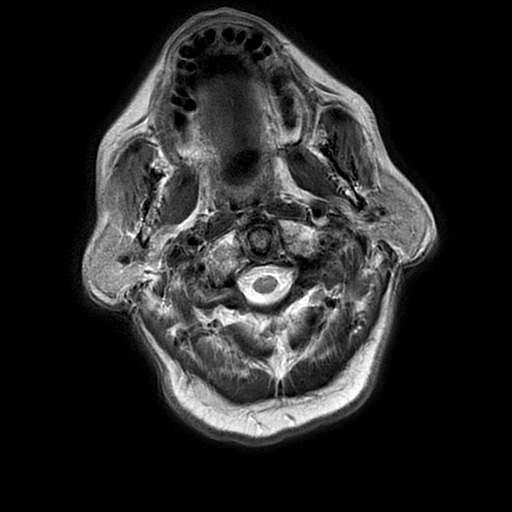
[im 30/30]
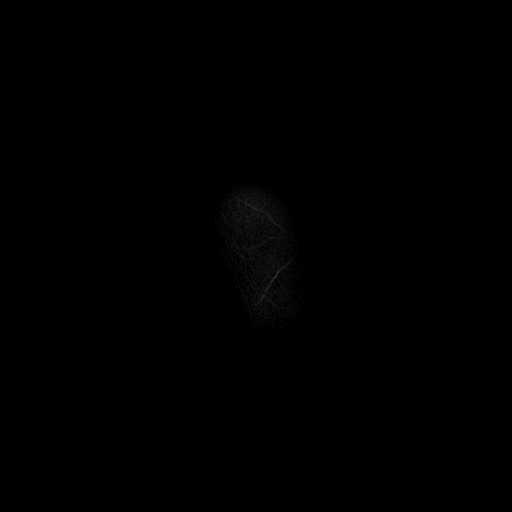

[Series 8: FLAIR · axial · 3.0mm · 0.45mm/px · z∈[-55,+116]mm · 2 of 30 slices shown (2 of 2)]
[im 1/30]
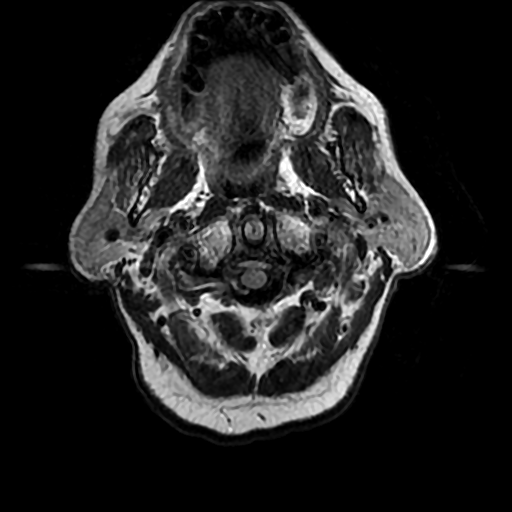
[im 30/30]
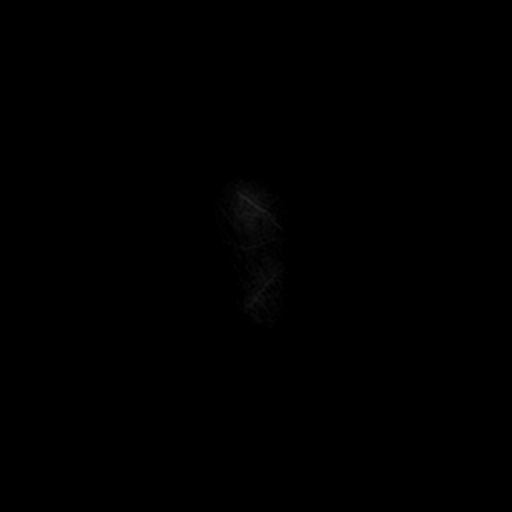

[Series 9: (person_name) · axial · 3.0mm · 0.47mm/px · z∈[-50,+44]mm · 5 of 112 slices shown]
[im 1/112]
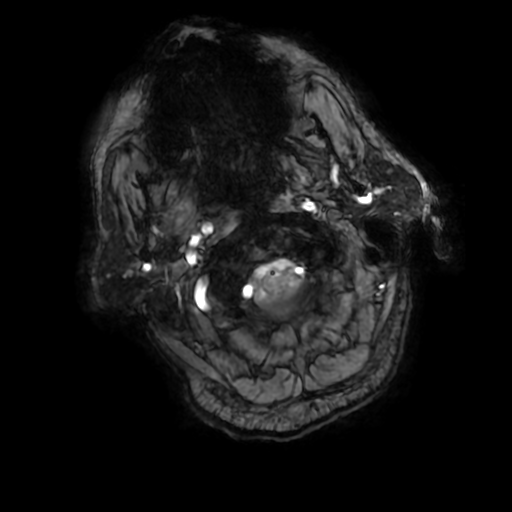
[im 16/112]
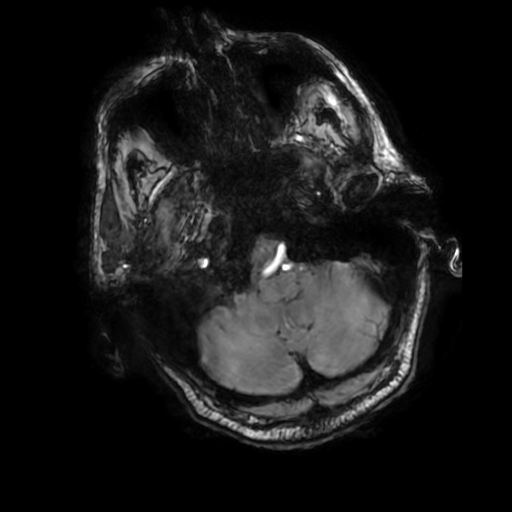
[im 32/112]
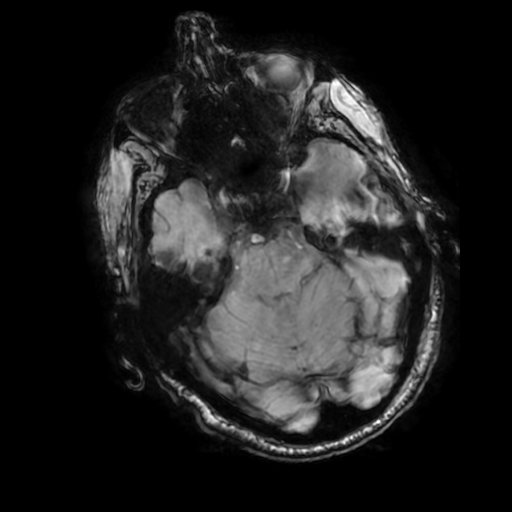
[im 48/112]
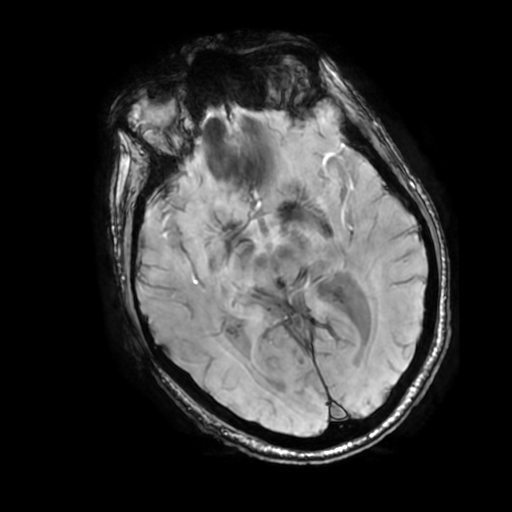
[im 64/112]
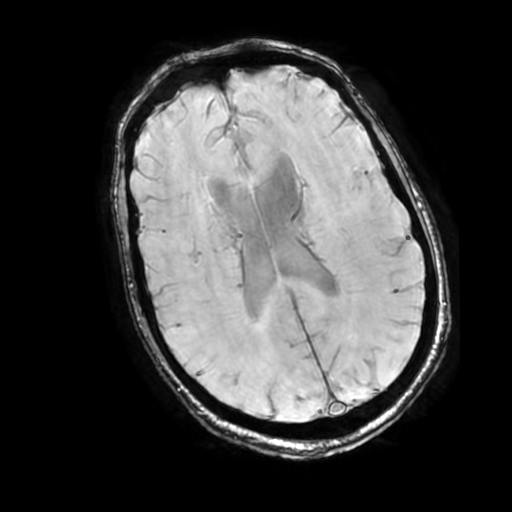

[Series 11: T2 · coronal · 5.0mm · 0.39mm/px · 2 of 32 slices shown (2 of 2)]
[im 1/32]
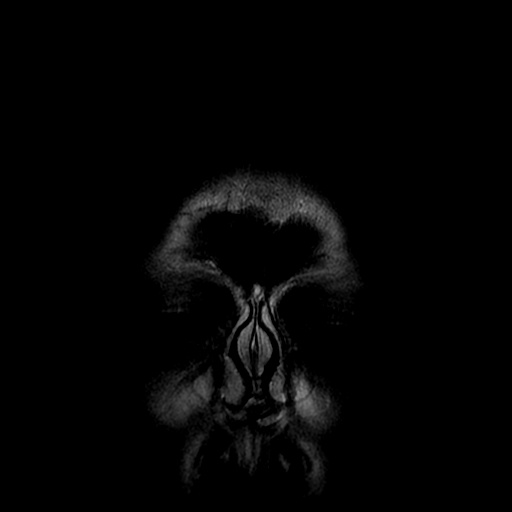
[im 32/32]
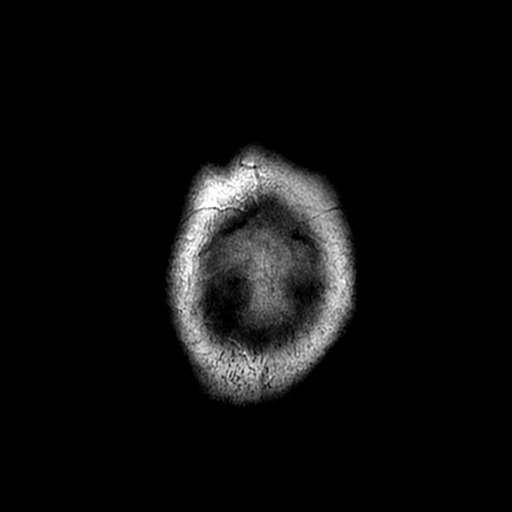

[Series 450: ADC · axial · 3.0mm · 0.94mm/px · z∈[-52,+124]mm · 4 of 59 slices shown (1 of 2)]
[im 1/59]
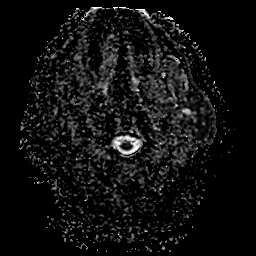
[im 20/59]
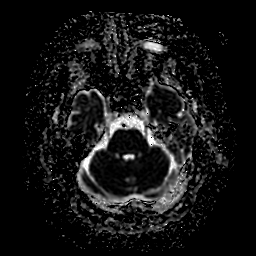
[im 39/59]
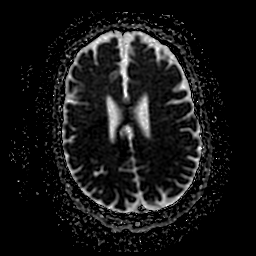
[im 59/59]
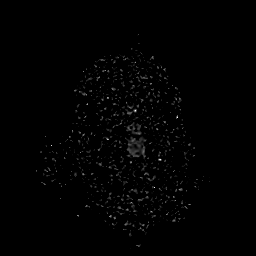

[Series 550: ADC · coronal · 4.0mm · 0.94mm/px · 3 of 38 slices shown (2 of 2)]
[im 1/38]
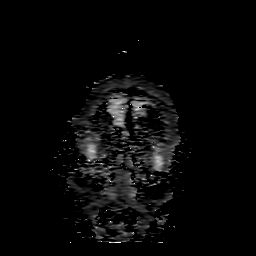
[im 19/38]
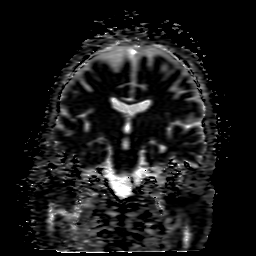
[im 38/38]
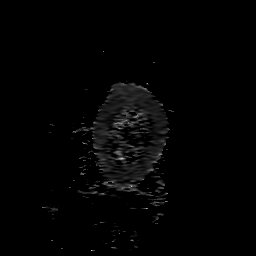

[33 of 48 positions shown; findings below may reference images not displayed]

FINDINGS: Brain: Ventricle size and cerebral volume normal. Negative for acute
or chronic infarct. Chronic microhemorrhage left frontal and left
occipital parietal white matter. Negative for mass or fluid
collection.

Vascular: Normal arterial flow voids.

Skull and upper cervical spine: Negative

Sinuses/Orbits: Mild mucosal edema paranasal sinuses.  Normal orbit

Other: None
IMPRESSION: Negative for acute infarct

Chronic microhemorrhage in the left frontal and left occipital
parietal lobe most likely due hypertension.

## 2020-05-20 DIAGNOSIS — R05 Cough: Secondary | ICD-10-CM | POA: Diagnosis not present

## 2020-05-25 DIAGNOSIS — E785 Hyperlipidemia, unspecified: Secondary | ICD-10-CM | POA: Diagnosis not present

## 2020-05-25 DIAGNOSIS — E782 Mixed hyperlipidemia: Secondary | ICD-10-CM | POA: Diagnosis not present

## 2020-05-25 DIAGNOSIS — N400 Enlarged prostate without lower urinary tract symptoms: Secondary | ICD-10-CM | POA: Diagnosis not present

## 2020-05-25 DIAGNOSIS — J449 Chronic obstructive pulmonary disease, unspecified: Secondary | ICD-10-CM | POA: Diagnosis not present

## 2020-05-25 DIAGNOSIS — I4891 Unspecified atrial fibrillation: Secondary | ICD-10-CM | POA: Diagnosis not present

## 2020-05-26 ENCOUNTER — Encounter: Payer: Self-pay | Admitting: Cardiology

## 2020-05-26 ENCOUNTER — Ambulatory Visit (INDEPENDENT_AMBULATORY_CARE_PROVIDER_SITE_OTHER): Payer: Medicare Other | Admitting: Cardiology

## 2020-05-26 ENCOUNTER — Other Ambulatory Visit: Payer: Self-pay

## 2020-05-26 VITALS — BP 120/70 | HR 83 | Ht 72.0 in | Wt 202.2 lb

## 2020-05-26 DIAGNOSIS — I471 Supraventricular tachycardia: Secondary | ICD-10-CM

## 2020-05-26 DIAGNOSIS — I480 Paroxysmal atrial fibrillation: Secondary | ICD-10-CM

## 2020-05-26 DIAGNOSIS — I300 Acute nonspecific idiopathic pericarditis: Secondary | ICD-10-CM

## 2020-05-26 NOTE — Patient Instructions (Signed)
Medication Instructions:  The current medical regimen is effective;  continue present plan and medications.  *If you need a refill on your cardiac medications before your next appointment, please call your pharmacy*  Testing/Procedures: Your physician has requested that you have an echocardiogram in 6 months. Echocardiography is a painless test that uses sound waves to create images of your heart. It provides your doctor with information about the size and shape of your heart and how well your heart's chambers and valves are working. This procedure takes approximately one hour. There are no restrictions for this procedure.  Follow-Up: At [facility], you and your health needs are our priority.  As part of our continuing mission to provide you with exceptional heart care, we have created designated Provider Care Teams.  These Care Teams include your primary Cardiologist (physician) and Advanced Practice Providers (APPs -  Physician Assistants and Nurse Practitioners) who all work together to provide you with the care you need, when you need it.  We recommend signing up for the patient portal called "MyChart".  Sign up information is provided on this After Visit Summary.  MyChart is used to connect with patients for Virtual Visits (Telemedicine).  Patients are able to view lab/test results, encounter notes, upcoming appointments, etc.  Non-urgent messages can be sent to your provider as well.   To learn more about what you can do with MyChart, go to [email or web address].    Your next appointment:   6 month(s)  The format for your next appointment:   In Person  Provider:   Candee Furbish, MD   Thank you for choosing [facility]!!

## 2020-05-26 NOTE — Progress Notes (Signed)
Cardiology Office Note:    Date:  05/26/2020   ID:  [name], DOB 01-23-1941, MRN [id number]  PCP:  Hulan Fess, MD  [facility] Cardiologist:  Candee Furbish, MD  [facility] Electrophysiologist:  None   Referring MD: Hulan Fess, MD     History of Present Illness:    [name] is a 79 y.o. male here for follow-up of persistent atrial fibrillation. Has also been diagnosed with pericarditis, CRP was normal.  Troponin normal EKG with no acute changes CT angiogram no acute PE but a small pericardial effusion was noted, echocardiogram confirmed with normal EF of 55%.  [place].  Subsequent EKG showed subtle ST elevation in the inferior leads.  Did not wish to take colchicine because of potential side effects.  Myasthenia gravis COPD Hyperlipidemia  Interestingly at last visit his EKG showed atrial fibrillation.  Anticoagulation was started but he did not feel well on the Eliquis, stomach upset, shortness of breath.  We switched to Xarelto.  Doing well now.  Thankfully now, his EKG shows normal sinus rhythm.   Past Medical History:  Diagnosis Date  . Abscess of sigmoid colon due to diverticulitis 12/26/2018  . Appendicitis 09/05/2014  . Basal cell carcinoma 10/04/2015   left [id number]  . BCC (basal cell carcinoma) 11/03/2019   left shin (txpbx)  . Colon polyp   . Complication of anesthesia    Difficult to arouse, Combative x 1  . Diverticulitis   . Diverticulosis    extensive 3'15.  . Dysrhythmia    hx. Ablation for tachycardia, no routine cardiology visits now  . ED (erectile dysfunction)   . Heart murmur    teenager  . History of colon polyps   . History of kidney stones    x1 passed  . Hyperlipidemia   . Myasthenia gravis ([place])   . Neuromuscular disorder ([place])    "neuroma"   . Osteoarthritis   . Palpitation    with svt rate 150  . 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 SUPRAVENTRICULAR  TACHYCARDIA 01/11/2011   Qualifier: Diagnosis of  By: [name], MD, [name]   . Tendinitis    achiles heel spur  . Testicular atrophy   . Tubular adenoma of colon     Past Surgical History:  Procedure Laterality Date  . CARDIAC ELECTROPHYSIOLOGY MAPPING AND ABLATION  2010   cardiac   . COLONOSCOPY  11/2018   Dr [name].  Many polyps.  rec f/u colonoscopy summer 2020  . EUS N/A 11/17/2014   Procedure: ESOPHAGEAL ENDOSCOPIC ULTRASOUND (EUS) RADIAL;  Surgeon: [name], MD;  Location: [facility];  Service: Endoscopy;  Laterality: N/A;  . FINE NEEDLE ASPIRATION N/A 11/17/2014   Procedure: FINE NEEDLE ASPIRATION (FNA) LINEAR;  Surgeon: [name], MD;  Location: [facility];  Service: Endoscopy;  Laterality: N/A;  + or- fna  . FLEXIBLE SIGMOIDOSCOPY    . HEMORRHOID SURGERY     banding   . INGUINAL HERNIA REPAIR Bilateral 2005   '05 bilateral hernia  . KNEE ARTHROSCOPY Left 2008   scope '08  . LAPAROSCOPIC APPENDECTOMY N/A 09/05/2014   Procedure: APPENDECTOMY LAPAROSCOPIC;  Surgeon: [name], MD;  Location: [place];  Service: General;  Laterality: N/A;  . PROCTOSCOPY N/A 12/26/2018   Procedure: RIGID PROCTOSCOPY;  Surgeon: [name], MD;  Location: [facility];  Service: General;  Laterality: N/A;  . ROTATOR CUFF REPAIR Right 2016    Current Medications: Current Meds  Medication Sig  . albuterol (VENTOLIN HFA) 108 (90 Base) MCG/ACT inhaler Inhale 2 puffs into the lungs every 4 (four) hours as needed.  Marland Kitchen atorvastatin (LIPITOR) 10 MG tablet Take 10 mg by mouth daily.  . COMBIVENT RESPIMAT 20-100 MCG/ACT AERS respimat   . Cyanocobalamin (VITAMIN B-12 PO) Take 5,000 mcg by mouth daily.   Marland Kitchen doxazosin (CARDURA) 8 MG tablet Take 8 mg by mouth daily.  . finasteride (PROSCAR) 5 MG tablet Take 5 mg by mouth every morning.   . fluticasone (FLONASE) 50 MCG/ACT nasal spray Place 2 sprays into both nostrils daily.  Marland Kitchen glucosamine-chondroitin 500-400 MG tablet Take by  mouth.  Marland Kitchen ipratropium (ATROVENT) 0.03 % nasal spray SMARTSIG:2 Spray(s) Both Nares Every 12 Hours  . metoprolol succinate (TOPROL-XL) 50 MG 24 hr tablet Take 1 tablet (50 mg total) by mouth daily. Take with or immediately following a meal.  . montelukast (SINGULAIR) 10 MG tablet Take 10 mg by mouth at bedtime.  . Multiple Vitamin (MULTIVITAMIN WITH MINERALS) TABS tablet Take 1 tablet by mouth daily.  . mupirocin ointment (BACTROBAN) 2 % APPLY TO AFFECTED AREA EVERY DAY  . predniSONE (DELTASONE) 10 MG tablet Take 1 tablet (10 mg total) by mouth daily with breakfast.  . Psyllium (QC NATURAL VEGETABLE) 95 % POWD Take by mouth.  . pyridostigmine (MESTINON) 60 MG tablet TAKE 0.5 TABLETS (30 MG TOTAL) BY MOUTH 3 (THREE) TIMES DAILY. TAKE HALF TABLET AT 8AM, 1PM, AND 6PM  . rivaroxaban (XARELTO) 20 MG TABS tablet Take 1 tablet (20 mg total) by mouth daily with supper.  . sodium chloride (OCEAN) 0.65 % SOLN nasal spray Place 1 spray into both nostrils as needed for congestion.  . traMADol (ULTRAM) 50 MG tablet Take 25-50 mg by mouth 2 (two) times daily as needed.     Allergies:   Ambrosia trifida (tall ragweed) allergy skin test, Bee pollen, Eliquis [apixaban], and Pollen extract   Social History   Socioeconomic History  . Marital status: Married    Spouse name: Not on file  . Number of children: 3  . Years of education: 70  . Highest education level: Some college, no degree  Occupational History  . Occupation: sales-Retired, works one day a week  Tobacco Use  . Smoking status: Former Smoker    Packs/day: 1.00    Years: 47.00    Pack years: 47.00    Types: Cigarettes    Quit date: 10/29/2000    Years since quitting: 19.5  . Smokeless tobacco: Never Used  Vaping Use  . Vaping Use: Never used  Substance and Sexual Activity  . Alcohol use: Yes    Alcohol/week: 1.0 standard drink    Types: 1 Standard drinks or equivalent per week    Comment: socially- nightly 1 cocktail  . Drug use: No   . Sexual activity: Not on file  Other Topics Concern  . Not on file  Social History Narrative   Lives with wife in a one story home with a basement.  Has 3 daughters.  Works one day a week at the [employer].  Retired from Press photographer.  Education: some college.    Caffeine use: decaf   Right handed   One story home   Social Determinants of Health   Financial Resource Strain:   . Difficulty of Paying Living Expenses:   Food Insecurity:   . Worried About Charity fundraiser in the Last Year:   . [place] in the Last  Year:   Transportation Needs:   . Film/video editor (Medical):   Marland Kitchen Lack of Transportation (Non-Medical):   Physical Activity:   . Days of Exercise per Week:   . Minutes of Exercise per Session:   Stress:   . Feeling of Stress :   Social Connections:   . Frequency of Communication with Friends and Family:   . Frequency of Social Gatherings with Friends and Family:   . Attends Religious Services:   . Active Member of Clubs or Organizations:   . Attends Archivist Meetings:   Marland Kitchen Marital Status:      Family History: The patient's family history includes Allergies in his brother; Aneurysm in his brother; Pancreatic cancer in his father; Stroke in his mother. There is no history of Neuropathy.  ROS:   Please see the history of present illness.     All other systems reviewed and are negative.  EKGs/Labs/Other Studies Reviewed:    The following studies were reviewed today: above  EKG:  EKG is  ordered today.  The ekg ordered today demonstrates sinus rhythm 83 nonspecific T wave changes.  Prior EKG atrial fibrillation  Recent Labs: No results found for requested labs within last 8760 hours.  Recent Lipid Panel    Component Value Date/Time   CHOL 138 06/14/2018 1605   TRIG 71 06/14/2018 1605   HDL 48 06/14/2018 1605   CHOLHDL 2.9 06/14/2018 1605   VLDL 14 06/14/2018 1605   LDLCALC 76 06/14/2018 1605    Physical Exam:    VS:  BP 120/70    Pulse 83   Ht 6' (1.829 m)   Wt 202 lb 3.2 oz (91.7 kg)   SpO2 93%   BMI 27.42 kg/m     Wt Readings from Last 3 Encounters:  05/26/20 202 lb 3.2 oz (91.7 kg)  05/10/20 209 lb (94.8 kg)  01/25/20 195 lb (88.5 kg)     GEN:  Well nourished, well developed in no acute distress HEENT: Normal, hearing aids NECK: No JVD; No carotid bruits LYMPHATICS: No lymphadenopathy CARDIAC: RRR, no murmurs, rubs, gallops RESPIRATORY:  Clear to auscultation without rales, wheezing or rhonchi  ABDOMEN: Soft, non-tender, non-distended MUSCULOSKELETAL:  No edema; No deformity  SKIN: Warm and dry NEUROLOGIC:  Alert and oriented x 3 PSYCHIATRIC:  Normal affect   ASSESSMENT:    1. Paroxysmal atrial fibrillation (HCC)   2. PSVT (paroxysmal supraventricular tachycardia) ([place])   3. Acute idiopathic pericarditis    PLAN:    In order of problems listed above:  Paroxysmal atrial fibrillation -Thankfully, now in sinus rhythm. -Newly discovered at last clinic visit on 05/10/2020.  Did not tolerate the Eliquis.  Started on Xarelto instead. -Toprol-XL 50 mg was given to improve rate control -It is possible that the acute pericarditis or pneumonia triggered this, however this was not seen at the hospital setting. -Because he is in normal rhythm now, no need for cardioversion.  I would like for him to continue with Xarelto for stroke protection.  He was asymptomatic with the atrial fibrillation previously.  Recent acute pericarditis 05/03/2020 in [place] -Hypotension shortness of breath relieved with IV fluids and stress dose steroids, myasthenia gravis diagnosis as well.  Subtle ST segment changes on EKG.  CRP interestingly was negative.  Troponins were negative.  He did have a small pericardial effusion.  Did not like to use the colchicine because of side effects.  Ibuprofen was completed for 7 days. -Pepcid  PSVT -Dr. Cristopher Peru  of EP ablation 2012.  Stable.  Myasthenia gravis -Stable.  Prednisone.   Dr. [name].  History of diverticulitis -Status post bowel resection, Dr. [name]  Family history of CAD -One brother died of stroke and had CAD  COPD/bronchitis -Emphysematous changes noted on CT scan previously.  [facility]  Former smoker -Several pack-year history but quit many years ago  See him back in 6 months.  Check echocardiogram at that time.  This will be helpful to see if his pericardial effusion had resolved.  Medication Adjustments/Labs and Tests Ordered: Current medicines are reviewed at length with the patient today.  Concerns regarding medicines are outlined above.  Orders Placed This Encounter  Procedures  . EKG 12-Lead  . ECHOCARDIOGRAM COMPLETE   No orders of the defined types were placed in this encounter.   Patient Instructions  Medication Instructions:  The current medical regimen is effective;  continue present plan and medications.  *If you need a refill on your cardiac medications before your next appointment, please call your pharmacy*  Testing/Procedures: Your physician has requested that you have an echocardiogram in 6 months. Echocardiography is a painless test that uses sound waves to create images of your heart. It provides your doctor with information about the size and shape of your heart and how well your heart's chambers and valves are working. This procedure takes approximately one hour. There are no restrictions for this procedure.  Follow-Up: At [facility], you and your health needs are our priority.  As part of our continuing mission to provide you with exceptional heart care, we have created designated Provider Care Teams.  These Care Teams include your primary Cardiologist (physician) and Advanced Practice Providers (APPs -  Physician Assistants and Nurse Practitioners) who all work together to provide you with the care you need, when you need it.  We recommend signing up for the patient portal called "MyChart".  Sign up  information is provided on this After Visit Summary.  MyChart is used to connect with patients for Virtual Visits (Telemedicine).  Patients are able to view lab/test results, encounter notes, upcoming appointments, etc.  Non-urgent messages can be sent to your provider as well.   To learn more about what you can do with MyChart, go to [email or web address].    Your next appointment:   6 month(s)  The format for your next appointment:   In Person  Provider:   Candee Furbish, MD   Thank you for choosing [facility]!!        Signed, Candee Furbish, MD  05/26/2020 12:30 PM    [place]

## 2020-05-31 ENCOUNTER — Ambulatory Visit: Payer: Medicare Other | Admitting: Orthotics

## 2020-05-31 ENCOUNTER — Other Ambulatory Visit: Payer: Self-pay

## 2020-05-31 DIAGNOSIS — R52 Pain, unspecified: Secondary | ICD-10-CM

## 2020-05-31 NOTE — Progress Notes (Signed)
After being told Medicare would not cover F/O; he didn't want them.

## 2020-06-10 ENCOUNTER — Ambulatory Visit (INDEPENDENT_AMBULATORY_CARE_PROVIDER_SITE_OTHER): Payer: Medicare Other | Admitting: Neurology

## 2020-06-10 ENCOUNTER — Other Ambulatory Visit: Payer: Self-pay

## 2020-06-10 ENCOUNTER — Encounter: Payer: Self-pay | Admitting: Neurology

## 2020-06-10 VITALS — BP 135/86 | HR 93 | Resp 18 | Ht 72.0 in | Wt 193.0 lb

## 2020-06-10 DIAGNOSIS — G7000 Myasthenia gravis without (acute) exacerbation: Secondary | ICD-10-CM | POA: Diagnosis not present

## 2020-06-10 DIAGNOSIS — M79672 Pain in left foot: Secondary | ICD-10-CM

## 2020-06-10 DIAGNOSIS — M79671 Pain in right foot: Secondary | ICD-10-CM | POA: Diagnosis not present

## 2020-06-10 DIAGNOSIS — G8929 Other chronic pain: Secondary | ICD-10-CM | POA: Diagnosis not present

## 2020-06-10 MED ORDER — PYRIDOSTIGMINE BROMIDE 60 MG PO TABS: 30.0000 mg | ORAL_TABLET | Freq: Three times a day (TID) | ORAL | 3 refills | Status: DC

## 2020-06-10 MED ORDER — PREDNISONE 10 MG PO TABS: 10.0000 mg | ORAL_TABLET | Freq: Every day | ORAL | 3 refills | Status: DC

## 2020-06-10 NOTE — Progress Notes (Signed)
Follow-up Visit   Date: 06/10/20    [name] MRN: [id number] DOB: [birth year]   Interim History: [name] is a 79 y.o. right-handed Caucasian male with hyperlipidemia, COPD, peripheral neuropathy, SVT s/p ablation, BPH, former smoker, and diverticulitis returning to the clinic for follow-up of myasthenia gravis.  The patient was accompanied to the clinic by wife.  History of present illness: Patient was diagnosed with myasthenia gravis in August 2019 after presenting with left ptosis, facial weakness, double vision, and dysphagia. He went to the ER for these symptoms where stroke was excluded and AChR antibodies returned positive. He was started on mestinon 30mg  three times daily (8am, 4pm, and midnight).  He was started on prednisone 20mg , with no improvement.  He was hospitalized from 9/19 - 07/23/2018 with MG exacerbated and treated with IVIG with great response. His prednisone was increased to 30mg  daily and he was started on IVIG, which helped control MG symptoms. During 2020, his prednisone was tapered and he has been on prednisone 10mg  daily.   He also complains of severe neck pain which is worse with prolonged standing and neck extension.  He usually has to sit back down and rest for relief. He does not have numbness/tingling of the hands or weakness.   He has previously been evaluated for neuropathy at [facility] and [place] with NCS/EMG which showed peripheral sensorimotor axonal neuropathy.  CSF testing was normal and genetic testing was normal.    UPDATE 06/10/2020:  He was visiting his daughter in [place] and hospitalized for respiratory insufficiency and found to have paroxysmal atrial fibrillation.  He was started on xeralto and doing well. During his hospitalization, wife had noticed transient mild droopiness of the eyelids. He denies double vision, difficulty swallowing/talking, or limb weakness. He has generalized sense of fatigue, especially  when he is out in the sun. He continues to complain of bilateral achy feet, which is worse when he wakes up in the morning and stands on his feet. He also has localized pain in the ankles. No numbness/tingling in the same area.   Medications:  Current Outpatient Medications on File Prior to Visit  Medication Sig Dispense Refill  . albuterol (VENTOLIN HFA) 108 (90 Base) MCG/ACT inhaler Inhale 2 puffs into the lungs every 4 (four) hours as needed.    Marland Kitchen atorvastatin (LIPITOR) 10 MG tablet Take 10 mg by mouth daily.    . COMBIVENT RESPIMAT 20-100 MCG/ACT AERS respimat     . Cyanocobalamin (VITAMIN B-12 PO) Take 5,000 mcg by mouth daily.     Marland Kitchen doxazosin (CARDURA) 8 MG tablet Take 8 mg by mouth daily.    . finasteride (PROSCAR) 5 MG tablet Take 5 mg by mouth every morning.   2  . fluticasone (FLONASE) 50 MCG/ACT nasal spray Place 2 sprays into both nostrils daily.    Marland Kitchen glucosamine-chondroitin 500-400 MG tablet Take by mouth.    Marland Kitchen ipratropium (ATROVENT) 0.03 % nasal spray SMARTSIG:2 Spray(s) Both Nares Every 12 Hours    . metoprolol succinate (TOPROL-XL) 50 MG 24 hr tablet Take 1 tablet (50 mg total) by mouth daily. Take with or immediately following a meal. 90 tablet 3  . montelukast (SINGULAIR) 10 MG tablet Take 10 mg by mouth at bedtime.    . Multiple Vitamin (MULTIVITAMIN WITH MINERALS) TABS tablet Take 1 tablet by mouth daily.    . mupirocin ointment (BACTROBAN) 2 % APPLY TO AFFECTED AREA EVERY DAY    . predniSONE (DELTASONE) 10 MG  tablet Take 1 tablet (10 mg total) by mouth daily with breakfast. 90 tablet 3  . Psyllium (QC NATURAL VEGETABLE) 95 % POWD Take by mouth.    . pyridostigmine (MESTINON) 60 MG tablet TAKE 0.5 TABLETS (30 MG TOTAL) BY MOUTH 3 (THREE) TIMES DAILY. TAKE HALF TABLET AT 8AM, 1PM, AND 6PM 135 tablet 3  . rivaroxaban (XARELTO) 20 MG TABS tablet Take 1 tablet (20 mg total) by mouth daily with supper. 30 tablet 5  . sodium chloride (OCEAN) 0.65 % SOLN nasal spray Place 1 spray  into both nostrils as needed for congestion.    . traMADol (ULTRAM) 50 MG tablet Take 25-50 mg by mouth 2 (two) times daily as needed.     No current facility-administered medications on file prior to visit.    Allergies:  Allergies  Allergen Reactions  . Ambrosia Trifida (Tall Ragweed) Allergy Skin Test Anaphylaxis  . Bee Pollen Itching    Nasal congestion  . Eliquis [Apixaban]     Throat tightening  . Pollen Extract Other (See Comments)    Nasal congestion    Review of Systems:  CONSTITUTIONAL: No fevers, chills, night sweats, or weight loss.  EYES: No visual changes or eye pain ENT: No hearing changes.  No history of nose bleeds.   RESPIRATORY: No cough, wheezing and shortness of breath.   CARDIOVASCULAR: Negative for chest pain, and palpitations.   GI: +for abdominal discomfort, blood in stools or black stools.  No recent change in bowel habits.   GU:  No history of incontinence.   MUSCLOSKELETAL: No history of joint pain or swelling.  No myalgias.   SKIN: Negative for lesions, rash, and itching.   ENDOCRINE: Negative for cold or heat intolerance, polydipsia or goiter.   PSYCH:  No depression or anxiety symptoms.   NEURO: As Above.   Vital Signs:  BP 135/86   Pulse 93   Resp 18   Ht 6' (1.829 m)   Wt 193 lb (87.5 kg)   SpO2 91%   BMI 26.18 kg/m    Neurological Exam: MENTAL STATUS including orientation to time, place, person, recent and remote memory, attention span and concentration, language, and fund of knowledge is normal.  Speech is not dysarthric.  CRANIAL NERVES:   Normal conjugate, extra-ocular eye movements in all directions of gaze.  Mild right ptosis, without worsening with sustained upgaze.  Face is symmetric.  Orbicularis oculi, buccinator, and orbicularis oris is 5/5.  Tongue strength is 5/5  MOTOR:  Motor strength is 5/5 in all extremities, without fatigability.    SENSATION: Vibration is intact in the arms, reduced in the  legs.  COORDINATION/GAIT:    Gait narrow based and stable. He is able to stand up from low chair without using arms.    Data: Labs 06/15/2018:  AChR binding 1.69*, MUSK neg  MRI brain wo contrast 06/14/2018: Negative for acute infarct. 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  CTA head and neck 06/14/2018: 1. Negative for large vessel occlusion, dissection, or arterial stenosis in the head or neck. There is minimal atherosclerosis. 2. Positive for generalized arterial tortuosity. Ectatic distal aortic arch and proximal descending aorta. 3. A small 15 mm area of ground-glass opacity in the right upper lobe appears stable since 2015. 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  NCS/EMG of the legs 03/13/2018:  This is an abnormal study. There is electrophysiologic evidence oflength-dependent, moderately-severe, axonal sensory-motor polyneuropathy.  CT chest 07/11/2018:  Negative for thymoma  MRI cervical spine wo contrast 07/14/2018: 1. No acute finding. 2. Disc and facet degeneration causes foraminal impingement that is advanced on the left at c3-4 and on the left at C5-6. 3. Mild right foraminal narrowing at C5-6 and C6-7.   IMPRESSION/PLAN: 1.  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  Hospitalized in Sept 2019, responsive to IVIG and prednisone 30mg /d.  Since this time, he has been on a tapering dose prednisone and tolerated it well.  Discussed with patient that pseudoexacerbation can occur in the setting of stress and warm temperatures and it does not require medication adjustment.  Clinically, he is doing very well with minimial disease manifestation.  - Continue prednisone 10mg  daily  - Continue mestinon 30mg  three times daily.  OK to take extra 30mg  as needed for breakthrough symptoms  - Stay well-hydrated, avoid extreme heat conditions  2.   Bilateral feet pain is seems arthritic or tendonitis in nature.  Although he has history of neuropathy, the nature of his symptoms is not consistent with neuropathy  Return to clinic in 6 months  Thank you for allowing me to participate in patient's care.  If I can answer any additional questions, I would be pleased to do so.    Sincerely,    Nico Rogness K. [name], DO

## 2020-06-10 NOTE — Patient Instructions (Signed)
Continue medications as you are taking  Return to clinic in 6 months

## 2020-06-13 ENCOUNTER — Telehealth: Payer: Self-pay | Admitting: Pharmacist

## 2020-06-13 MED ORDER — RIVAROXABAN 20 MG PO TABS: 20.0000 mg | ORAL_TABLET | Freq: Every day | ORAL | 5 refills | Status: DC

## 2020-06-13 NOTE — Telephone Encounter (Signed)
Called patient insurance [employer]- [facility] about pt cost of Xarelto. Pt has $330 deductible. Cost after deductible will be $35. I called patient and reviewed this with him. He is ok paying the deductible.  Called [facility] and left message on VM for them to fill Xarelto as they had reversed claim previously.

## 2020-06-16 DIAGNOSIS — H35413 Lattice degeneration of retina, bilateral: Secondary | ICD-10-CM | POA: Diagnosis not present

## 2020-06-16 DIAGNOSIS — H0102A Squamous blepharitis right eye, upper and lower eyelids: Secondary | ICD-10-CM | POA: Diagnosis not present

## 2020-06-16 DIAGNOSIS — H26493 Other secondary cataract, bilateral: Secondary | ICD-10-CM | POA: Diagnosis not present

## 2020-06-16 DIAGNOSIS — Z961 Presence of intraocular lens: Secondary | ICD-10-CM | POA: Diagnosis not present

## 2020-06-16 DIAGNOSIS — H43813 Vitreous degeneration, bilateral: Secondary | ICD-10-CM | POA: Diagnosis not present

## 2020-06-16 DIAGNOSIS — H04123 Dry eye syndrome of bilateral lacrimal glands: Secondary | ICD-10-CM | POA: Diagnosis not present

## 2020-06-16 DIAGNOSIS — H0102B Squamous blepharitis left eye, upper and lower eyelids: Secondary | ICD-10-CM | POA: Diagnosis not present

## 2020-06-20 DIAGNOSIS — R972 Elevated prostate specific antigen [PSA]: Secondary | ICD-10-CM | POA: Diagnosis not present

## 2020-06-20 DIAGNOSIS — N210 Calculus in bladder: Secondary | ICD-10-CM | POA: Diagnosis not present

## 2020-06-20 DIAGNOSIS — R351 Nocturia: Secondary | ICD-10-CM | POA: Diagnosis not present

## 2020-06-20 DIAGNOSIS — N401 Enlarged prostate with lower urinary tract symptoms: Secondary | ICD-10-CM | POA: Diagnosis not present

## 2020-07-14 DIAGNOSIS — J449 Chronic obstructive pulmonary disease, unspecified: Secondary | ICD-10-CM | POA: Diagnosis not present

## 2020-07-14 DIAGNOSIS — N401 Enlarged prostate with lower urinary tract symptoms: Secondary | ICD-10-CM | POA: Diagnosis not present

## 2020-07-14 DIAGNOSIS — E785 Hyperlipidemia, unspecified: Secondary | ICD-10-CM | POA: Diagnosis not present

## 2020-07-14 DIAGNOSIS — I4891 Unspecified atrial fibrillation: Secondary | ICD-10-CM | POA: Diagnosis not present

## 2020-07-14 DIAGNOSIS — E782 Mixed hyperlipidemia: Secondary | ICD-10-CM | POA: Diagnosis not present

## 2020-07-14 DIAGNOSIS — N400 Enlarged prostate without lower urinary tract symptoms: Secondary | ICD-10-CM | POA: Diagnosis not present

## 2020-07-25 DIAGNOSIS — Z23 Encounter for immunization: Secondary | ICD-10-CM | POA: Diagnosis not present

## 2020-08-04 ENCOUNTER — Other Ambulatory Visit: Payer: Self-pay

## 2020-08-04 MED ORDER — METOPROLOL SUCCINATE ER 50 MG PO TB24: 50.0000 mg | ORAL_TABLET | Freq: Every day | ORAL | 3 refills | Status: DC

## 2020-08-11 DIAGNOSIS — Z23 Encounter for immunization: Secondary | ICD-10-CM | POA: Diagnosis not present

## 2020-08-22 ENCOUNTER — Ambulatory Visit: Payer: Medicare Other | Admitting: Neurology

## 2020-09-02 DIAGNOSIS — M533 Sacrococcygeal disorders, not elsewhere classified: Secondary | ICD-10-CM | POA: Diagnosis not present

## 2020-09-02 DIAGNOSIS — M7062 Trochanteric bursitis, left hip: Secondary | ICD-10-CM | POA: Diagnosis not present

## 2020-09-02 DIAGNOSIS — M7061 Trochanteric bursitis, right hip: Secondary | ICD-10-CM | POA: Diagnosis not present

## 2020-09-16 DIAGNOSIS — E782 Mixed hyperlipidemia: Secondary | ICD-10-CM | POA: Diagnosis not present

## 2020-09-16 DIAGNOSIS — E785 Hyperlipidemia, unspecified: Secondary | ICD-10-CM | POA: Diagnosis not present

## 2020-09-16 DIAGNOSIS — N401 Enlarged prostate with lower urinary tract symptoms: Secondary | ICD-10-CM | POA: Diagnosis not present

## 2020-09-16 DIAGNOSIS — N400 Enlarged prostate without lower urinary tract symptoms: Secondary | ICD-10-CM | POA: Diagnosis not present

## 2020-09-16 DIAGNOSIS — J449 Chronic obstructive pulmonary disease, unspecified: Secondary | ICD-10-CM | POA: Diagnosis not present

## 2020-09-16 DIAGNOSIS — G8929 Other chronic pain: Secondary | ICD-10-CM | POA: Diagnosis not present

## 2020-09-16 DIAGNOSIS — I4891 Unspecified atrial fibrillation: Secondary | ICD-10-CM | POA: Diagnosis not present

## 2020-09-20 DIAGNOSIS — M533 Sacrococcygeal disorders, not elsewhere classified: Secondary | ICD-10-CM | POA: Diagnosis not present

## 2020-09-26 DIAGNOSIS — M79671 Pain in right foot: Secondary | ICD-10-CM | POA: Diagnosis not present

## 2020-09-26 DIAGNOSIS — M79672 Pain in left foot: Secondary | ICD-10-CM | POA: Diagnosis not present

## 2020-09-29 DIAGNOSIS — M19072 Primary osteoarthritis, left ankle and foot: Secondary | ICD-10-CM | POA: Diagnosis not present

## 2020-09-29 DIAGNOSIS — M19071 Primary osteoarthritis, right ankle and foot: Secondary | ICD-10-CM | POA: Diagnosis not present

## 2020-10-26 DIAGNOSIS — R143 Flatulence: Secondary | ICD-10-CM | POA: Diagnosis not present

## 2020-10-26 DIAGNOSIS — M545 Low back pain, unspecified: Secondary | ICD-10-CM | POA: Diagnosis not present

## 2020-11-07 ENCOUNTER — Other Ambulatory Visit (HOSPITAL_COMMUNITY): Payer: Medicare Other

## 2020-11-07 DIAGNOSIS — M48062 Spinal stenosis, lumbar region with neurogenic claudication: Secondary | ICD-10-CM | POA: Diagnosis not present

## 2020-11-09 ENCOUNTER — Telehealth: Payer: Self-pay | Admitting: *Deleted

## 2020-11-09 ENCOUNTER — Other Ambulatory Visit: Payer: Self-pay

## 2020-11-09 ENCOUNTER — Ambulatory Visit (HOSPITAL_COMMUNITY): Payer: Medicare Other | Attending: Cardiology

## 2020-11-09 DIAGNOSIS — I480 Paroxysmal atrial fibrillation: Secondary | ICD-10-CM | POA: Diagnosis not present

## 2020-11-09 DIAGNOSIS — I471 Supraventricular tachycardia: Secondary | ICD-10-CM | POA: Diagnosis not present

## 2020-11-09 DIAGNOSIS — I300 Acute nonspecific idiopathic pericarditis: Secondary | ICD-10-CM

## 2020-11-09 LAB — ECHOCARDIOGRAM COMPLETE
Area-P 1/2: 2.45 cm2
S' Lateral: 3 cm

## 2020-11-09 NOTE — Telephone Encounter (Signed)
Supple, Harlon Flor, RPH-CPP  You; Ramond Dial, RPH-CPP; Jerline Pain, MD 2 hours ago (11:25 AM)   Lenna Sciara is off today so I'm covering her inbox. If he has a similar Part D plan to last year, this higher copay is due to his annual deductible that reset on January 1. We don't have his prescription insurance scanned into our system, but it would likely be similar to his copays last year ($330 deductible that he needs to meet at the beginning of every year followed by monthly copay of around $35). There's really no way around paying through the deductible unfortunately since all DOACs are still branded and the same deductible would apply. If he is not willing to pay through the deductible, the cheapest option would be for him to change to warfarin.   Thanks,  [employer] text    You  Ramond Dial, RPH-CPP; Jerline Pain, MD 21 hours ago (4:48 PM)   Lenna Sciara - you were able to assist this pt in August with his cost -do you know of anything else that can be done to help lower his cost?  Or does he need to be changed to warfarin?   Thank you,  Pam   Message text    Loren Racer, RN routed conversation to You 21 hours ago (4:31 PM)   Lenda Kelp, MD 21 hours ago (4:29 PM)      My  RX is  down to the last 3 days. It  will cost  over           $400 to renew it.  Is there  anything  less expensive  that  I can  take?   Attempted to contact pt by phone to discuss his option of paying through his deductible or changing to Warfarin.  Left message on pt's home # to call back to discuss.

## 2020-11-16 NOTE — Telephone Encounter (Signed)
Freida Busman to Jerline Pain, MD    I will stay with  Xarelto.

## 2020-11-18 ENCOUNTER — Other Ambulatory Visit: Payer: Self-pay | Admitting: Physical Medicine and Rehabilitation

## 2020-11-18 DIAGNOSIS — M4800 Spinal stenosis, site unspecified: Secondary | ICD-10-CM

## 2020-11-25 ENCOUNTER — Ambulatory Visit
Admission: RE | Admit: 2020-11-25 | Discharge: 2020-11-25 | Disposition: A | Discharge status: Home / Self Care | Payer: Medicare Other | Source: Ambulatory Visit | Attending: Physical Medicine and Rehabilitation | Admitting: Physical Medicine and Rehabilitation

## 2020-11-25 ENCOUNTER — Other Ambulatory Visit: Payer: Self-pay

## 2020-11-25 DIAGNOSIS — N401 Enlarged prostate with lower urinary tract symptoms: Secondary | ICD-10-CM | POA: Diagnosis not present

## 2020-11-25 DIAGNOSIS — M4800 Spinal stenosis, site unspecified: Secondary | ICD-10-CM

## 2020-11-25 DIAGNOSIS — I4891 Unspecified atrial fibrillation: Secondary | ICD-10-CM | POA: Diagnosis not present

## 2020-11-25 DIAGNOSIS — M545 Low back pain, unspecified: Secondary | ICD-10-CM | POA: Diagnosis not present

## 2020-11-25 DIAGNOSIS — N400 Enlarged prostate without lower urinary tract symptoms: Secondary | ICD-10-CM | POA: Diagnosis not present

## 2020-11-25 DIAGNOSIS — E785 Hyperlipidemia, unspecified: Secondary | ICD-10-CM | POA: Diagnosis not present

## 2020-11-25 DIAGNOSIS — E782 Mixed hyperlipidemia: Secondary | ICD-10-CM | POA: Diagnosis not present

## 2020-11-25 DIAGNOSIS — G8929 Other chronic pain: Secondary | ICD-10-CM | POA: Diagnosis not present

## 2020-11-25 DIAGNOSIS — J449 Chronic obstructive pulmonary disease, unspecified: Secondary | ICD-10-CM | POA: Diagnosis not present

## 2020-12-05 DIAGNOSIS — G5701 Lesion of sciatic nerve, right lower limb: Secondary | ICD-10-CM | POA: Diagnosis not present

## 2020-12-07 ENCOUNTER — Other Ambulatory Visit: Payer: Self-pay | Admitting: Cardiology

## 2020-12-07 NOTE — Telephone Encounter (Signed)
Prescription refill request for Xarelto received.   Indication: afib Last office visit: 05/26/2020, [place] Weight: 87.5 kg  Age: 80 yo  Scr: 0.73, 05/06/2020 CrCl: 98 ml/min   Prescription refill sent.

## 2020-12-12 ENCOUNTER — Ambulatory Visit (INDEPENDENT_AMBULATORY_CARE_PROVIDER_SITE_OTHER): Payer: Medicare Other | Admitting: Neurology

## 2020-12-12 ENCOUNTER — Encounter: Payer: Self-pay | Admitting: Neurology

## 2020-12-12 ENCOUNTER — Other Ambulatory Visit: Payer: Self-pay

## 2020-12-12 VITALS — BP 102/69 | HR 77 | Ht 72.0 in | Wt 215.0 lb

## 2020-12-12 DIAGNOSIS — G7000 Myasthenia gravis without (acute) exacerbation: Secondary | ICD-10-CM | POA: Diagnosis not present

## 2020-12-12 NOTE — Patient Instructions (Addendum)
Continue medications as you are taking  Return to clinic in 3 months

## 2020-12-12 NOTE — Progress Notes (Signed)
Follow-up Visit   Date: 12/12/20    [name] MRN: [id number] DOB: [birth year]   Interim History: [name] is a 80 y.o. right-handed Caucasian male with hyperlipidemia, COPD, peripheral neuropathy, SVT s/p ablation, BPH, former smoker, and diverticulitis returning to the clinic for follow-up of myasthenia gravis.  The patient was accompanied to the clinic by wife.  History of present illness: Patient was diagnosed with myasthenia gravis in August 2019 after presenting with left ptosis, facial weakness, double vision, and dysphagia. He went to the ER for these symptoms where stroke was excluded and AChR antibodies returned positive. He was started on mestinon 30mg  three times daily (8am, 4pm, and midnight).  He was started on prednisone 20mg , with no improvement.  He was hospitalized from 9/19 - 07/23/2018 with MG exacerbated and treated with IVIG with great response. His prednisone was increased to 30mg  daily and he was started on IVIG, which helped control MG symptoms. During 2020, his prednisone was tapered and he has been on prednisone 10mg  daily.   He also complains of severe neck pain which is worse with prolonged standing and neck extension.  He usually has to sit back down and rest for relief. He does not have numbness/tingling of the hands or weakness.   He has previously been evaluated for neuropathy at [facility] and [place] with NCS/EMG which showed peripheral sensorimotor axonal neuropathy.  CSF testing was normal and genetic testing was normal.    UPDATE 12/12/2020:  He is here for 31-month follow-up visit.  No new complaints.  He is doing well on prednisone 10mg  daily and mestinon 30mg  TID.  He denies double vision, ptosis, difficulty swallowing/talking or limb weakness.   Medications:  Current Outpatient Medications on File Prior to Visit  Medication Sig Dispense Refill  . albuterol (VENTOLIN HFA) 108 (90 Base) MCG/ACT inhaler Inhale 2 puffs  into the lungs every 4 (four) hours as needed.    Marland Kitchen atorvastatin (LIPITOR) 10 MG tablet Take 10 mg by mouth daily.    . COMBIVENT RESPIMAT 20-100 MCG/ACT AERS respimat     . Cyanocobalamin (VITAMIN B-12 PO) Take 5,000 mcg by mouth daily.     Marland Kitchen doxazosin (CARDURA) 8 MG tablet Take 8 mg by mouth daily.    . finasteride (PROSCAR) 5 MG tablet Take 5 mg by mouth every morning.   2  . fluticasone (FLONASE) 50 MCG/ACT nasal spray Place 2 sprays into both nostrils daily.    Marland Kitchen glucosamine-chondroitin 500-400 MG tablet Take by mouth.    Marland Kitchen ipratropium (ATROVENT) 0.03 % nasal spray SMARTSIG:2 Spray(s) Both Nares Every 12 Hours    . metoprolol succinate (TOPROL-XL) 50 MG 24 hr tablet Take 1 tablet (50 mg total) by mouth daily. Take with or immediately following a meal. 90 tablet 3  . montelukast (SINGULAIR) 10 MG tablet Take 10 mg by mouth at bedtime.    . Multiple Vitamin (MULTIVITAMIN WITH MINERALS) TABS tablet Take 1 tablet by mouth daily.    . mupirocin ointment (BACTROBAN) 2 % APPLY TO AFFECTED AREA EVERY DAY    . predniSONE (DELTASONE) 10 MG tablet Take 1 tablet (10 mg total) by mouth daily with breakfast. 90 tablet 3  . Psyllium (QC NATURAL VEGETABLE) 95 % POWD Take by mouth.    . pyridostigmine (MESTINON) 60 MG tablet Take 0.5 tablets (30 mg total) by mouth 3 (three) times daily. Take half tablet at 8am, 1pm, and 6pm 135 tablet 3  . sodium chloride (OCEAN) 0.65 %  SOLN nasal spray Place 1 spray into both nostrils as needed for congestion.    . traMADol (ULTRAM) 50 MG tablet Take 25-50 mg by mouth 2 (two) times daily as needed.    Alveda Reasons 20 MG TABS tablet TAKE 1 TABLET (20 MG TOTAL) BY MOUTH DAILY WITH SUPPER. 90 tablet 1   No current facility-administered medications on file prior to visit.    Allergies:  Allergies  Allergen Reactions  . Ambrosia Trifida (Tall Ragweed) Allergy Skin Test Anaphylaxis  . Bee Pollen Itching    Nasal congestion  . Eliquis [Apixaban]     Throat tightening  .  Pollen Extract Other (See Comments)    Nasal congestion    Vital Signs:  BP 102/69   Pulse 77   Ht 6' (1.829 m)   Wt 215 lb (97.5 kg)   SpO2 95%   BMI 29.16 kg/m    Neurological Exam: MENTAL STATUS including orientation to time, place, person, recent and remote memory, attention span and concentration, language, and fund of knowledge is normal.  Speech is not dysarthric.  CRANIAL NERVES:   Normal conjugate, extra-ocular eye movements in all directions of gaze.  Mild right ptosis, without worsening with sustained upgaze.  Face is symmetric.  Orbicularis oculi, buccinator, and orbicularis oris is 5/5.  Tongue strength is 5/5  MOTOR:  Motor strength is 5/5 in all extremities, without fatigability.    COORDINATION/GAIT:    Gait narrow based and stable.   Data: Labs 06/15/2018:  AChR binding 1.69*, MUSK neg  MRI brain wo contrast 06/14/2018: Negative for acute infarct. 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  CTA head and neck 06/14/2018: 1. Negative for large vessel occlusion, dissection, or arterial stenosis in the head or neck. There is minimal atherosclerosis. 2. Positive for generalized arterial tortuosity. Ectatic distal aortic arch and proximal descending aorta. 3. A small 15 mm area of ground-glass opacity in the right upper lobe appears stable since 2015. 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  NCS/EMG of the legs 03/13/2018:  This is an abnormal study. There is electrophysiologic evidence oflength-dependent, moderately-severe, axonal sensory-motor polyneuropathy.  CT chest 07/11/2018:  Negative for thymoma  MRI cervical spine wo contrast 07/14/2018: 1. No acute finding. 2. Disc and facet degeneration causes foraminal impingement that is advanced on the left at c3-4 and on the left at C5-6. 3. Mild right foraminal narrowing at C5-6 and C6-7.   IMPRESSION/PLAN: Seropositive bulbar  myasthenia gravis, thymoma negative (August 2019), without exacerbation.  Hospitalized in Sept 2019, responsive to IVIG and prednisone 30mg /d.  Since this time, he has been on a tapering dose prednisone and tolerated it well.  Discussed with patient that pseudoexacerbation can occur in the setting of stress and warm temperatures and it does not require medication adjustment.  Clinically, he is doing very well with minimial disease manifestation.  - Continue prednisone 10mg  daily  - Continue mestinon 30mg  three times daily.  OK to take extra 30mg  as needed for breakthrough symptoms  - Stay well-hydrated, avoid extreme heat conditions  Return to clinic in 3 months  Total time spent reviewing records, interview, history/exam, documentation, counseling, and coordination of care on day of encounter:  20 min   Thank you for allowing me to participate in patient's care.  If I can answer any additional questions, I would be pleased to do so.    Sincerely,    Dulcinea Kinser K. [name], DO

## 2020-12-13 DIAGNOSIS — R972 Elevated prostate specific antigen [PSA]: Secondary | ICD-10-CM | POA: Diagnosis not present

## 2020-12-19 DIAGNOSIS — N210 Calculus in bladder: Secondary | ICD-10-CM | POA: Diagnosis not present

## 2020-12-19 DIAGNOSIS — R972 Elevated prostate specific antigen [PSA]: Secondary | ICD-10-CM | POA: Diagnosis not present

## 2020-12-19 DIAGNOSIS — N401 Enlarged prostate with lower urinary tract symptoms: Secondary | ICD-10-CM | POA: Diagnosis not present

## 2020-12-19 DIAGNOSIS — R351 Nocturia: Secondary | ICD-10-CM | POA: Diagnosis not present

## 2020-12-20 DIAGNOSIS — G5702 Lesion of sciatic nerve, left lower limb: Secondary | ICD-10-CM | POA: Diagnosis not present

## 2020-12-28 ENCOUNTER — Encounter: Payer: Self-pay | Admitting: Dermatology

## 2020-12-28 DIAGNOSIS — Z0000 Encounter for general adult medical examination without abnormal findings: Secondary | ICD-10-CM | POA: Diagnosis not present

## 2020-12-28 DIAGNOSIS — Z1389 Encounter for screening for other disorder: Secondary | ICD-10-CM | POA: Diagnosis not present

## 2020-12-29 DIAGNOSIS — R143 Flatulence: Secondary | ICD-10-CM | POA: Diagnosis not present

## 2020-12-29 DIAGNOSIS — Z86010 Personal history of colonic polyps: Secondary | ICD-10-CM | POA: Diagnosis not present

## 2020-12-29 NOTE — Telephone Encounter (Signed)
Pt's PCP has been changed to Dr Jacelyn Grip

## 2021-01-03 ENCOUNTER — Other Ambulatory Visit: Payer: Self-pay

## 2021-01-03 ENCOUNTER — Ambulatory Visit
Admission: EM | Admit: 2021-01-03 | Discharge: 2021-01-03 | Disposition: A | Discharge status: Home / Self Care | Payer: Medicare Other | Attending: Family Medicine | Admitting: Family Medicine

## 2021-01-03 DIAGNOSIS — R21 Rash and other nonspecific skin eruption: Secondary | ICD-10-CM

## 2021-01-03 DIAGNOSIS — E782 Mixed hyperlipidemia: Secondary | ICD-10-CM | POA: Diagnosis not present

## 2021-01-03 DIAGNOSIS — J449 Chronic obstructive pulmonary disease, unspecified: Secondary | ICD-10-CM | POA: Diagnosis not present

## 2021-01-03 DIAGNOSIS — N401 Enlarged prostate with lower urinary tract symptoms: Secondary | ICD-10-CM | POA: Diagnosis not present

## 2021-01-03 DIAGNOSIS — L309 Dermatitis, unspecified: Secondary | ICD-10-CM | POA: Diagnosis not present

## 2021-01-03 DIAGNOSIS — N400 Enlarged prostate without lower urinary tract symptoms: Secondary | ICD-10-CM | POA: Diagnosis not present

## 2021-01-03 DIAGNOSIS — I4891 Unspecified atrial fibrillation: Secondary | ICD-10-CM | POA: Diagnosis not present

## 2021-01-03 DIAGNOSIS — G8929 Other chronic pain: Secondary | ICD-10-CM | POA: Diagnosis not present

## 2021-01-03 MED ORDER — BETAMETHASONE DIPROPIONATE 0.05 % EX OINT: TOPICAL_OINTMENT | Freq: Two times a day (BID) | CUTANEOUS | 0 refills | Status: DC

## 2021-01-03 NOTE — ED Provider Notes (Signed)
[place]   [id number] 01/03/21 Arrival Time: 1016  CC: RASH  SUBJECTIVE:  [name] is a 80 y.o. male who presents with a skin complaint that began about a week ago. Reports both cheeks are red and irritated. Reports that he did not shave this morning due to irritation. Denies precipitating event or trauma. Denies changes in soaps, detergents, close contacts with similar rash, known trigger or environmental trigger, allergy. Denies medications change or starting a new medication recently. Has tried moisturizer without relief. There are no aggravating or alleviating factors. Denies similar symptoms in the past.   Denies fever, chills, nausea, vomiting, swelling, discharge, oral lesions, SOB, chest pain, abdominal pain, changes in bowel or bladder function.    ROS: As per HPI.  All other pertinent ROS negative.     Past Medical History:  Diagnosis Date  . Abscess of sigmoid colon due to diverticulitis 12/26/2018  . Appendicitis 09/05/2014  . Basal cell carcinoma 10/04/2015   left deltoid-[id number]  . BCC (basal cell carcinoma) 11/03/2019   left shin (txpbx)  . Colon polyp   . Complication of anesthesia    Difficult to arouse, Combative x 1  . Diverticulitis   . Diverticulosis    extensive 3'15.  . Dysrhythmia    hx. Ablation for tachycardia, no routine cardiology visits now  . ED (erectile dysfunction)   . Heart murmur    teenager  . History of colon polyps   . History of kidney stones    x1 passed  . Hyperlipidemia   . Myasthenia gravis ([place])   . Neuromuscular disorder ([place])    "neuroma"   . Osteoarthritis   . Palpitation    with svt rate 150  . 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 Tendinitis    achiles heel spur  . Testicular atrophy   . Tubular adenoma of colon    Past Surgical History:  Procedure Laterality Date   . CARDIAC ELECTROPHYSIOLOGY MAPPING AND ABLATION  2010   cardiac   . COLONOSCOPY  11/2018   Dr [name].  Many polyps.  rec f/u colonoscopy summer 2020  . EUS N/A 11/17/2014   Procedure: ESOPHAGEAL ENDOSCOPIC ULTRASOUND (EUS) RADIAL;  Surgeon: [name], MD;  Location: [facility];  Service: Endoscopy;  Laterality: N/A;  . FINE NEEDLE ASPIRATION N/A 11/17/2014   Procedure: FINE NEEDLE ASPIRATION (FNA) LINEAR;  Surgeon: [name], MD;  Location: [facility];  Service: Endoscopy;  Laterality: N/A;  + or- fna  . FLEXIBLE SIGMOIDOSCOPY    . HEMORRHOID SURGERY     banding   . INGUINAL HERNIA REPAIR Bilateral 2005   '05 bilateral hernia  . KNEE ARTHROSCOPY Left 2008   scope '08  . LAPAROSCOPIC APPENDECTOMY N/A 09/05/2014   Procedure: APPENDECTOMY LAPAROSCOPIC;  Surgeon: [name], MD;  Location: [place];  Service: General;  Laterality: N/A;  . PROCTOSCOPY N/A 12/26/2018   Procedure: RIGID PROCTOSCOPY;  Surgeon: [name], MD;  Location: [facility];  Service: General;  Laterality: N/A;  . ROTATOR CUFF REPAIR Right 2016   Allergies  Allergen Reactions  . Ambrosia Trifida (Tall Ragweed) Allergy Skin Test Anaphylaxis  . Bee Pollen Itching    Nasal congestion  . Eliquis [Apixaban]     Throat tightening  . Pollen Extract Other (See Comments)    Nasal congestion   No current facility-administered medications  on file prior to encounter.   Current Outpatient Medications on File Prior to Encounter  Medication Sig Dispense Refill  . albuterol (VENTOLIN HFA) 108 (90 Base) MCG/ACT inhaler Inhale 2 puffs into the lungs every 4 (four) hours as needed.    Marland Kitchen atorvastatin (LIPITOR) 10 MG tablet Take 10 mg by mouth daily.    . COMBIVENT RESPIMAT 20-100 MCG/ACT AERS respimat     . Cyanocobalamin (VITAMIN B-12 PO) Take 5,000 mcg by mouth daily.     Marland Kitchen doxazosin (CARDURA) 8 MG tablet Take 8 mg by mouth daily.    . finasteride (PROSCAR) 5 MG tablet Take 5 mg by mouth every morning.   2  .  fluticasone (FLONASE) 50 MCG/ACT nasal spray Place 2 sprays into both nostrils daily.    Marland Kitchen glucosamine-chondroitin 500-400 MG tablet Take by mouth.    Marland Kitchen ipratropium (ATROVENT) 0.03 % nasal spray SMARTSIG:2 Spray(s) Both Nares Every 12 Hours    . metoprolol succinate (TOPROL-XL) 50 MG 24 hr tablet Take 1 tablet (50 mg total) by mouth daily. Take with or immediately following a meal. 90 tablet 3  . montelukast (SINGULAIR) 10 MG tablet Take 10 mg by mouth at bedtime.    . Multiple Vitamin (MULTIVITAMIN WITH MINERALS) TABS tablet Take 1 tablet by mouth daily.    . mupirocin ointment (BACTROBAN) 2 % APPLY TO AFFECTED AREA EVERY DAY    . predniSONE (DELTASONE) 10 MG tablet Take 1 tablet (10 mg total) by mouth daily with breakfast. 90 tablet 3  . Psyllium (QC NATURAL VEGETABLE) 95 % POWD Take by mouth.    . pyridostigmine (MESTINON) 60 MG tablet Take 0.5 tablets (30 mg total) by mouth 3 (three) times daily. Take half tablet at 8am, 1pm, and 6pm 135 tablet 3  . sodium chloride (OCEAN) 0.65 % SOLN nasal spray Place 1 spray into both nostrils as needed for congestion.    . traMADol (ULTRAM) 50 MG tablet Take 25-50 mg by mouth 2 (two) times daily as needed.    Alveda Reasons 20 MG TABS tablet TAKE 1 TABLET (20 MG TOTAL) BY MOUTH DAILY WITH SUPPER. 90 tablet 1   Social History   Socioeconomic History  . Marital status: Married    Spouse name: Not on file  . Number of children: 3  . Years of education: 89  . Highest education level: Some college, no degree  Occupational History  . Occupation: sales-Retired, works one day a week  Tobacco Use  . Smoking status: Former Smoker    Packs/day: 1.00    Years: 47.00    Pack years: 47.00    Types: Cigarettes    Quit date: 10/29/2000    Years since quitting: 20.1  . Smokeless tobacco: Never Used  Vaping Use  . Vaping Use: Never used  Substance and Sexual Activity  . Alcohol use: Yes    Alcohol/week: 1.0 standard drink    Types: 1 Standard drinks or equivalent  per week    Comment: socially- nightly 1 cocktail  . Drug use: No  . Sexual activity: Not on file  Other Topics Concern  . Not on file  Social History Narrative   Lives with wife in a one story home with a basement.  Has 3 daughters.  Works one day a week at the [employer].  Retired from [employer].  Education: some college.    Caffeine use: decaf   Right handed   One story home   Social Determinants of Health  Financial Resource Strain: Not on file  Food Insecurity: Not on file  Transportation Needs: Not on file  Physical Activity: Not on file  Stress: Not on file  Social Connections: Not on file  Intimate Partner Violence: Not on file   Family History  Problem Relation Age of Onset  . Pancreatic cancer Father   . Stroke Mother   . Aneurysm Brother   . Allergies Brother   . Neuropathy Neg Hx     OBJECTIVE: Vitals:   01/03/21 1045  BP: 115/87  Pulse: 78  Resp: 20  Temp: 97.8 F (36.6 C)  SpO2: 94%    General appearance: alert; no distress Head: NCAT Lungs: clear to auscultation bilaterally Heart: regular rate and rhythm.  Radial pulse 2+ bilaterally Extremities: no edema Skin: warm and dry; both cheeks erythematous with papules present bilaterally Psychological: alert and cooperative; normal mood and affect  ASSESSMENT & PLAN:  1. Dermatitis   2. Rash and nonspecific skin eruption     Meds ordered this encounter  Medications  . betamethasone dipropionate (DIPROLENE) 0.05 % ointment    Sig: Apply topically 2 (two) times daily.    Dispense:  30 g    Refill:  0    Order Specific Question:   Supervising Provider    Answer:   Chase Picket [[id number]]   Prescribed diprolene BID prn Continue to moisturize the skin well Avoid hot showers/ baths Moisturize skin daily  Follow up with PCP if symptoms persists Return or go to the ER if you have any new or worsening symptoms such as fever, chills, nausea, vomiting, redness, swelling, discharge, if symptoms do  not improve with medications  Reviewed expectations re: course of current medical issues. Questions answered. Outlined signs and symptoms indicating need for more acute intervention. Patient verbalized understanding. After Visit Summary given.   Faustino Congress, NP 01/03/21 1151

## 2021-01-03 NOTE — ED Triage Notes (Signed)
Pt awoken with rash on face this morning, denies inching or pain, red bums noted to cheeks

## 2021-01-03 NOTE — Discharge Instructions (Signed)
Prescribed diprolene ointment to use twice a day as needed  Follow up with this office or with primary care if symptoms are persisting.  Follow up in the ER for high fever, trouble swallowing, trouble breathing, other concerning symptoms.

## 2021-01-09 DIAGNOSIS — G5701 Lesion of sciatic nerve, right lower limb: Secondary | ICD-10-CM | POA: Diagnosis not present

## 2021-01-11 DIAGNOSIS — Z87891 Personal history of nicotine dependence: Secondary | ICD-10-CM | POA: Diagnosis not present

## 2021-01-11 DIAGNOSIS — J449 Chronic obstructive pulmonary disease, unspecified: Secondary | ICD-10-CM | POA: Diagnosis not present

## 2021-01-11 DIAGNOSIS — R911 Solitary pulmonary nodule: Secondary | ICD-10-CM | POA: Diagnosis not present

## 2021-01-11 DIAGNOSIS — R053 Chronic cough: Secondary | ICD-10-CM | POA: Diagnosis not present

## 2021-01-17 DIAGNOSIS — G7000 Myasthenia gravis without (acute) exacerbation: Secondary | ICD-10-CM | POA: Diagnosis not present

## 2021-01-17 DIAGNOSIS — Z79899 Other long term (current) drug therapy: Secondary | ICD-10-CM | POA: Diagnosis not present

## 2021-01-17 DIAGNOSIS — I498 Other specified cardiac arrhythmias: Secondary | ICD-10-CM | POA: Diagnosis not present

## 2021-01-17 DIAGNOSIS — G902 Horner's syndrome: Secondary | ICD-10-CM | POA: Diagnosis not present

## 2021-01-17 DIAGNOSIS — G629 Polyneuropathy, unspecified: Secondary | ICD-10-CM | POA: Diagnosis not present

## 2021-01-17 DIAGNOSIS — E785 Hyperlipidemia, unspecified: Secondary | ICD-10-CM | POA: Diagnosis not present

## 2021-01-17 DIAGNOSIS — N401 Enlarged prostate with lower urinary tract symptoms: Secondary | ICD-10-CM | POA: Diagnosis not present

## 2021-01-17 DIAGNOSIS — J449 Chronic obstructive pulmonary disease, unspecified: Secondary | ICD-10-CM | POA: Diagnosis not present

## 2021-01-17 DIAGNOSIS — I471 Supraventricular tachycardia: Secondary | ICD-10-CM | POA: Diagnosis not present

## 2021-01-20 DIAGNOSIS — E785 Hyperlipidemia, unspecified: Secondary | ICD-10-CM | POA: Diagnosis not present

## 2021-01-20 DIAGNOSIS — R7303 Prediabetes: Secondary | ICD-10-CM | POA: Diagnosis not present

## 2021-01-20 DIAGNOSIS — I498 Other specified cardiac arrhythmias: Secondary | ICD-10-CM | POA: Diagnosis not present

## 2021-01-20 DIAGNOSIS — G629 Polyneuropathy, unspecified: Secondary | ICD-10-CM | POA: Diagnosis not present

## 2021-01-20 DIAGNOSIS — R911 Solitary pulmonary nodule: Secondary | ICD-10-CM | POA: Diagnosis not present

## 2021-01-20 DIAGNOSIS — J449 Chronic obstructive pulmonary disease, unspecified: Secondary | ICD-10-CM | POA: Diagnosis not present

## 2021-01-20 DIAGNOSIS — N401 Enlarged prostate with lower urinary tract symptoms: Secondary | ICD-10-CM | POA: Diagnosis not present

## 2021-01-20 DIAGNOSIS — I471 Supraventricular tachycardia: Secondary | ICD-10-CM | POA: Diagnosis not present

## 2021-01-20 DIAGNOSIS — G902 Horner's syndrome: Secondary | ICD-10-CM | POA: Diagnosis not present

## 2021-01-20 DIAGNOSIS — Z79899 Other long term (current) drug therapy: Secondary | ICD-10-CM | POA: Diagnosis not present

## 2021-01-20 DIAGNOSIS — G7000 Myasthenia gravis without (acute) exacerbation: Secondary | ICD-10-CM | POA: Diagnosis not present

## 2021-01-23 ENCOUNTER — Other Ambulatory Visit: Payer: Self-pay | Admitting: Family Medicine

## 2021-01-23 DIAGNOSIS — R911 Solitary pulmonary nodule: Secondary | ICD-10-CM

## 2021-01-24 MED ORDER — METOPROLOL SUCCINATE ER 50 MG PO TB24: 50.0000 mg | ORAL_TABLET | Freq: Every day | ORAL | 0 refills | Status: DC

## 2021-02-04 ENCOUNTER — Other Ambulatory Visit: Payer: Medicare Other

## 2021-02-06 DIAGNOSIS — E785 Hyperlipidemia, unspecified: Secondary | ICD-10-CM | POA: Diagnosis not present

## 2021-02-06 DIAGNOSIS — M6281 Muscle weakness (generalized): Secondary | ICD-10-CM | POA: Diagnosis not present

## 2021-02-06 DIAGNOSIS — E782 Mixed hyperlipidemia: Secondary | ICD-10-CM | POA: Diagnosis not present

## 2021-02-06 DIAGNOSIS — M25651 Stiffness of right hip, not elsewhere classified: Secondary | ICD-10-CM | POA: Diagnosis not present

## 2021-02-06 DIAGNOSIS — J449 Chronic obstructive pulmonary disease, unspecified: Secondary | ICD-10-CM | POA: Diagnosis not present

## 2021-02-06 DIAGNOSIS — M5416 Radiculopathy, lumbar region: Secondary | ICD-10-CM | POA: Diagnosis not present

## 2021-02-06 DIAGNOSIS — M25551 Pain in right hip: Secondary | ICD-10-CM | POA: Diagnosis not present

## 2021-02-06 DIAGNOSIS — N400 Enlarged prostate without lower urinary tract symptoms: Secondary | ICD-10-CM | POA: Diagnosis not present

## 2021-02-06 DIAGNOSIS — G8929 Other chronic pain: Secondary | ICD-10-CM | POA: Diagnosis not present

## 2021-02-06 DIAGNOSIS — I4891 Unspecified atrial fibrillation: Secondary | ICD-10-CM | POA: Diagnosis not present

## 2021-02-06 DIAGNOSIS — N401 Enlarged prostate with lower urinary tract symptoms: Secondary | ICD-10-CM | POA: Diagnosis not present

## 2021-02-10 ENCOUNTER — Other Ambulatory Visit: Payer: Self-pay

## 2021-02-10 ENCOUNTER — Ambulatory Visit
Admission: RE | Admit: 2021-02-10 | Discharge: 2021-02-10 | Disposition: A | Discharge status: Home / Self Care | Payer: Medicare Other | Source: Ambulatory Visit | Attending: Family Medicine | Admitting: Family Medicine

## 2021-02-10 DIAGNOSIS — R911 Solitary pulmonary nodule: Secondary | ICD-10-CM

## 2021-02-10 DIAGNOSIS — R918 Other nonspecific abnormal finding of lung field: Secondary | ICD-10-CM | POA: Diagnosis not present

## 2021-02-16 DIAGNOSIS — M25651 Stiffness of right hip, not elsewhere classified: Secondary | ICD-10-CM | POA: Diagnosis not present

## 2021-02-16 DIAGNOSIS — M6281 Muscle weakness (generalized): Secondary | ICD-10-CM | POA: Diagnosis not present

## 2021-02-16 DIAGNOSIS — M25551 Pain in right hip: Secondary | ICD-10-CM | POA: Diagnosis not present

## 2021-02-16 DIAGNOSIS — M5416 Radiculopathy, lumbar region: Secondary | ICD-10-CM | POA: Diagnosis not present

## 2021-02-17 DIAGNOSIS — J209 Acute bronchitis, unspecified: Secondary | ICD-10-CM | POA: Diagnosis not present

## 2021-02-17 DIAGNOSIS — R9389 Abnormal findings on diagnostic imaging of other specified body structures: Secondary | ICD-10-CM | POA: Diagnosis not present

## 2021-02-20 DIAGNOSIS — M6281 Muscle weakness (generalized): Secondary | ICD-10-CM | POA: Diagnosis not present

## 2021-02-20 DIAGNOSIS — M5416 Radiculopathy, lumbar region: Secondary | ICD-10-CM | POA: Diagnosis not present

## 2021-02-20 DIAGNOSIS — M25651 Stiffness of right hip, not elsewhere classified: Secondary | ICD-10-CM | POA: Diagnosis not present

## 2021-02-20 DIAGNOSIS — M25551 Pain in right hip: Secondary | ICD-10-CM | POA: Diagnosis not present

## 2021-02-27 DIAGNOSIS — G5701 Lesion of sciatic nerve, right lower limb: Secondary | ICD-10-CM | POA: Diagnosis not present

## 2021-02-28 DIAGNOSIS — R911 Solitary pulmonary nodule: Secondary | ICD-10-CM | POA: Diagnosis not present

## 2021-02-28 DIAGNOSIS — J449 Chronic obstructive pulmonary disease, unspecified: Secondary | ICD-10-CM | POA: Diagnosis not present

## 2021-02-28 DIAGNOSIS — R9389 Abnormal findings on diagnostic imaging of other specified body structures: Secondary | ICD-10-CM | POA: Diagnosis not present

## 2021-03-13 ENCOUNTER — Other Ambulatory Visit: Payer: Self-pay

## 2021-03-13 ENCOUNTER — Ambulatory Visit (INDEPENDENT_AMBULATORY_CARE_PROVIDER_SITE_OTHER): Payer: Medicare Other | Admitting: Neurology

## 2021-03-13 ENCOUNTER — Encounter: Payer: Self-pay | Admitting: Neurology

## 2021-03-13 VITALS — BP 112/73 | HR 87 | Ht 72.0 in | Wt 206.0 lb

## 2021-03-13 DIAGNOSIS — G7000 Myasthenia gravis without (acute) exacerbation: Secondary | ICD-10-CM | POA: Diagnosis not present

## 2021-03-13 NOTE — Patient Instructions (Signed)
Continue prednisone 10mg  daily and mestinon 30mg  three times daily  Return to clinic 3 months

## 2021-03-13 NOTE — Progress Notes (Signed)
Follow-up Visit   Date: 03/13/21    [name] MRN: [id number] DOB: [birth year]   Interim History: [name] is a 80 y.o. right-handed Caucasian male with hyperlipidemia, COPD, peripheral neuropathy, SVT s/p ablation, BPH, former smoker, and diverticulitis returning to the clinic for follow-up of myasthenia gravis.  The patient was accompanied to the clinic by wife.  History of present illness: Patient was diagnosed with myasthenia gravis in August 2019 after presenting with left ptosis, facial weakness, double vision, and dysphagia. He went to the ER for these symptoms where stroke was excluded and AChR antibodies returned positive. He was started on mestinon 30mg  three times daily (8am, 4pm, and midnight).  He was started on prednisone 20mg , with no improvement.  He was hospitalized from 9/19 - 07/23/2018 with MG exacerbated and treated with IVIG with great response. His prednisone was increased to 30mg  daily and he was started on IVIG, which helped control MG symptoms. During 2020, his prednisone was tapered and he has been on prednisone 10mg  daily.   He also complains of severe neck pain which is worse with prolonged standing and neck extension.  He usually has to sit back down and rest for relief. He does not have numbness/tingling of the hands or weakness.   He has previously been evaluated for neuropathy at 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  CSF testing was normal and genetic testing was normal.    UPDATE 12/12/2020:  He is here for 75-month follow-up visit.  No new complaints.  He is doing well on prednisone 10mg  daily and mestinon 30mg  TID.  He denies double vision, ptosis, difficulty swallowing/talking or limb weakness.  UPDATE 03/13/2021:  Over the past several weeks, he had 3-4 times where his lower lip is weak and is drooling. Symptoms are transient and there is no specific pattern, such as if it occurs in  the warmer temperature.  He denies ongoing double vision, difficulty swallowing/talking, or limb weakness.  Wife says that he is very anxious about his health and any symptoms make him worried that his MG is getting worse.  He remains on prednisone 10mg  daily and mestinon 30mg  three times daily.  He is having bilateral hip pain, leg stiffness, and back pain. He will be getting injections for this.   Medications:  Current Outpatient Medications on File Prior to Visit  Medication Sig Dispense Refill  . albuterol (VENTOLIN HFA) 108 (90 Base) MCG/ACT inhaler Inhale 2 puffs into the lungs every 4 (four) hours as needed.    Marland Kitchen atorvastatin (LIPITOR) 10 MG tablet Take 10 mg by mouth daily.    . betamethasone dipropionate (DIPROLENE) 0.05 % ointment Apply topically 2 (two) times daily. 30 g 0  . COMBIVENT RESPIMAT 20-100 MCG/ACT AERS respimat     . Cyanocobalamin (VITAMIN B-12 PO) Take 5,000 mcg by mouth daily.     Marland Kitchen doxazosin (CARDURA) 8 MG tablet Take 8 mg by mouth daily.    . finasteride (PROSCAR) 5 MG tablet Take 5 mg by mouth every morning.   2  . fluticasone (FLONASE) 50 MCG/ACT nasal spray Place 2 sprays into both nostrils daily.    Marland Kitchen glucosamine-chondroitin 500-400 MG tablet Take by mouth.    Marland Kitchen ipratropium (ATROVENT) 0.03 % nasal spray SMARTSIG:2 Spray(s) Both Nares Every 12 Hours    . metoprolol succinate (TOPROL-XL) 50 MG 24 hr tablet Take 1 tablet (50 mg total) by mouth daily. Take with or immediately following a  meal. Please make yearly appt with Dr. Marlou Porch for July 2022 for future refills. Thank you 1st attempt 90 tablet 0  . montelukast (SINGULAIR) 10 MG tablet Take 10 mg by mouth at bedtime.    . Multiple Vitamin (MULTIVITAMIN WITH MINERALS) TABS tablet Take 1 tablet by mouth daily.    . mupirocin ointment (BACTROBAN) 2 % APPLY TO AFFECTED AREA EVERY DAY    . predniSONE (DELTASONE) 10 MG tablet Take 1 tablet (10 mg total) by mouth daily with breakfast. 90 tablet 3  . Psyllium (QC NATURAL  VEGETABLE) 95 % POWD Take by mouth.    . pyridostigmine (MESTINON) 60 MG tablet Take 0.5 tablets (30 mg total) by mouth 3 (three) times daily. Take half tablet at 8am, 1pm, and 6pm 135 tablet 3  . sodium chloride (OCEAN) 0.65 % SOLN nasal spray Place 1 spray into both nostrils as needed for congestion.    . traMADol (ULTRAM) 50 MG tablet Take 25-50 mg by mouth 2 (two) times daily as needed.    Alveda Reasons 20 MG TABS tablet TAKE 1 TABLET (20 MG TOTAL) BY MOUTH DAILY WITH SUPPER. 90 tablet 1   No current facility-administered medications on file prior to visit.    Allergies:  Allergies  Allergen Reactions  . Ambrosia Trifida (Tall Ragweed) Allergy Skin Test Anaphylaxis  . Bee Pollen Itching    Nasal congestion  . Eliquis [Apixaban]     Throat tightening  . Pollen Extract Other (See Comments)    Nasal congestion    Vital Signs:  BP 112/73   Pulse 87   Ht 6' (1.829 m)   Wt 206 lb (93.4 kg)   SpO2 90%   BMI 27.94 kg/m    Neurological Exam: MENTAL STATUS including orientation to time, place, person, recent and remote memory, attention span and concentration, language, and fund of knowledge is normal.  Speech is not dysarthric.  CRANIAL NERVES:   Normal conjugate, extra-ocular eye movements in all directions of gaze.  Mild ptosis, without worsening with sustained upgaze.  Face is symmetric.  Orbicularis oculi, buccinator, and orbicularis oris is 5/5.  Tongue strength is 5/5  MOTOR:  Motor strength is 5/5 in all extremities, without fatigability.  Neck flexion is 5/5.   REFLEXES:  Reflexes are 2+/4 throughout, except absent at the ankles.   SENSATION:  Reduced vibration at the ankles, intact above the knees.   COORDINATION/GAIT:    Gait narrow based, stooped, mildly antalgic appearing. Unassisted, stable.   Data: Labs 06/15/2018:  AChR binding 1.69*, MUSK neg  MRI brain wo contrast 06/14/2018: Negative for acute infarct. 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  CTA head and neck 06/14/2018: 1. Negative for large vessel occlusion, dissection, or arterial stenosis in the head or neck. There is minimal atherosclerosis. 2. Positive for generalized arterial tortuosity. Ectatic distal aortic arch and proximal descending aorta. 3. A small 15 mm area of ground-glass opacity in the right upper lobe appears stable since 2015. 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  NCS/EMG of the legs 03/13/2018:  This is an abnormal study. There is electrophysiologic evidence oflength-dependent, moderately-severe, axonal sensory-motor polyneuropathy.  CT chest 07/11/2018:  Negative for thymoma  MRI cervical spine wo contrast 07/14/2018: 1. No acute finding. 2. Disc and facet degeneration causes foraminal impingement that is advanced on the left at c3-4 and on the left at C5-6. 3. Mild right foraminal narrowing at C5-6 and C6-7.   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  Hospitalized in Sept 2019, responsive to IVIG and prednisone 30mg /d.  Since this time, he has been on a tapering dose prednisone and tolerated it well. He is having intermittent spells of lip weakness, which we will monitor.  His exam is stable with minimal evidence of disease.  - Continue prednisone 10mg  daily.  If he develops new weakness concerning for true exacerbation, prednisone will need to be increased to 20mg /d.  - Continue mestinon 30mg  three times daily.  OK to take extra 30mg  as needed for breakthrough symptoms  - Stay well-hydrated, avoid extreme heat conditions  Return to clinic in 3 months  Total time spent reviewing records, interview, history/exam, documentation, and coordination of care on day of encounter:  20 min   Thank you for allowing me to participate in patient's care.  If I can answer any additional questions, I would be pleased to do  so.    Sincerely,    Sheila Gervasi K. [name], DO

## 2021-03-16 DIAGNOSIS — G5701 Lesion of sciatic nerve, right lower limb: Secondary | ICD-10-CM | POA: Diagnosis not present

## 2021-03-31 DIAGNOSIS — M5416 Radiculopathy, lumbar region: Secondary | ICD-10-CM | POA: Diagnosis not present

## 2021-04-06 ENCOUNTER — Other Ambulatory Visit: Payer: Self-pay | Admitting: Physical Medicine and Rehabilitation

## 2021-04-06 DIAGNOSIS — M5416 Radiculopathy, lumbar region: Secondary | ICD-10-CM

## 2021-04-11 ENCOUNTER — Other Ambulatory Visit: Payer: Self-pay

## 2021-04-11 ENCOUNTER — Ambulatory Visit
Admission: RE | Admit: 2021-04-11 | Discharge: 2021-04-11 | Disposition: A | Discharge status: Home / Self Care | Payer: Medicare Other | Source: Ambulatory Visit | Attending: Physical Medicine and Rehabilitation | Admitting: Physical Medicine and Rehabilitation

## 2021-04-11 DIAGNOSIS — K5730 Diverticulosis of large intestine without perforation or abscess without bleeding: Secondary | ICD-10-CM | POA: Diagnosis not present

## 2021-04-11 DIAGNOSIS — N400 Enlarged prostate without lower urinary tract symptoms: Secondary | ICD-10-CM | POA: Diagnosis not present

## 2021-04-11 DIAGNOSIS — M5416 Radiculopathy, lumbar region: Secondary | ICD-10-CM

## 2021-04-11 DIAGNOSIS — N210 Calculus in bladder: Secondary | ICD-10-CM | POA: Diagnosis not present

## 2021-04-11 DIAGNOSIS — R102 Pelvic and perineal pain: Secondary | ICD-10-CM | POA: Diagnosis not present

## 2021-04-13 DIAGNOSIS — I4891 Unspecified atrial fibrillation: Secondary | ICD-10-CM | POA: Diagnosis not present

## 2021-04-13 DIAGNOSIS — J449 Chronic obstructive pulmonary disease, unspecified: Secondary | ICD-10-CM | POA: Diagnosis not present

## 2021-04-13 DIAGNOSIS — N401 Enlarged prostate with lower urinary tract symptoms: Secondary | ICD-10-CM | POA: Diagnosis not present

## 2021-04-13 DIAGNOSIS — Z23 Encounter for immunization: Secondary | ICD-10-CM | POA: Diagnosis not present

## 2021-04-13 DIAGNOSIS — N400 Enlarged prostate without lower urinary tract symptoms: Secondary | ICD-10-CM | POA: Diagnosis not present

## 2021-04-13 DIAGNOSIS — E785 Hyperlipidemia, unspecified: Secondary | ICD-10-CM | POA: Diagnosis not present

## 2021-04-13 DIAGNOSIS — G8929 Other chronic pain: Secondary | ICD-10-CM | POA: Diagnosis not present

## 2021-04-13 DIAGNOSIS — E782 Mixed hyperlipidemia: Secondary | ICD-10-CM | POA: Diagnosis not present

## 2021-04-14 DIAGNOSIS — M5416 Radiculopathy, lumbar region: Secondary | ICD-10-CM | POA: Diagnosis not present

## 2021-04-17 ENCOUNTER — Telehealth: Payer: Self-pay | Admitting: *Deleted

## 2021-04-17 NOTE — Telephone Encounter (Signed)
   [facility] Pre-operative Risk Assessment    Patient Name: [name]  DOB: 11-12-1940  MRN: [id number]   HEARTCARE STAFF: - Please ensure there is not already an duplicate clearance open for this procedure. - Under Visit Info/Reason for Call, type in Other and utilize the format Clearance MM/DD/YY or Clearance TBD. Do not use dashes or single digits. - If request is for dental extraction, please clarify the # of teeth to be extracted. - If the patient is currently at the dentist's office, call Pre-Op APP to address. If the patient is not currently in the dentist office, please route to the Pre-Op pool  Request for surgical clearance:  What type of surgery is being performed? B/L L5-S1 LUMBAR TFESI   When is this surgery scheduled? TBD   What type of clearance is required (medical clearance vs. Pharmacy clearance to hold med vs. Both)? BOTH  Are there any medications that need to be held prior to surgery and how long? XARELTO x 2 DAYS PRIOR PER DR. Choccolocco name and name of physician performing surgery? [facility]; DR. Normajean Glasgow   What is the office phone number? [phone]   7.   What is the office fax number? [phone] ATTN: [place]  8.   Anesthesia type (None, local, MAC, general) ? BLOCK   Julaine Hua 04/17/2021, 4:50 PM  _________________________________________________________________   (provider comments below)

## 2021-04-17 NOTE — Telephone Encounter (Signed)
Pharmacy, can you please comment on how long Xarelto can be held for upcoming procedure?  Thank you!

## 2021-04-18 ENCOUNTER — Ambulatory Visit (INDEPENDENT_AMBULATORY_CARE_PROVIDER_SITE_OTHER): Payer: Medicare Other | Admitting: Dermatology

## 2021-04-18 ENCOUNTER — Other Ambulatory Visit: Payer: Self-pay

## 2021-04-18 ENCOUNTER — Encounter: Payer: Self-pay | Admitting: Pharmacist

## 2021-04-18 DIAGNOSIS — Z1283 Encounter for screening for malignant neoplasm of skin: Secondary | ICD-10-CM

## 2021-04-18 DIAGNOSIS — L570 Actinic keratosis: Secondary | ICD-10-CM | POA: Diagnosis not present

## 2021-04-18 DIAGNOSIS — L309 Dermatitis, unspecified: Secondary | ICD-10-CM

## 2021-04-18 DIAGNOSIS — I700 Atherosclerosis of aorta: Secondary | ICD-10-CM | POA: Insufficient documentation

## 2021-04-18 DIAGNOSIS — D485 Neoplasm of uncertain behavior of skin: Secondary | ICD-10-CM | POA: Diagnosis not present

## 2021-04-18 DIAGNOSIS — L308 Other specified dermatitis: Secondary | ICD-10-CM | POA: Diagnosis not present

## 2021-04-18 NOTE — Patient Instructions (Signed)

## 2021-04-18 NOTE — Telephone Encounter (Addendum)
Patient with diagnosis of atrial fibrillation on Xarelto for anticoagulation.    Procedure: B/L L5-S1 LUMBAR TFESI Date of procedure: TBD  CHA2DS2-VASc Score = 3  his indicates a 3.2% annual risk of stroke. The patient's score is based upon: CHF History: No HTN History: No Diabetes History: No Stroke History: No Vascular Disease History: Yes (aortic atherosclerosis noted on chest CT) Age Score: 2 Gender Score: 0  CrCl 105 ml/min Platelet count 198K  Per office protocol, patient can hold Xarelto for 3 days prior to procedure.    Patient should restart Xarelto as soon as safely possible, at discretion of procedure MD

## 2021-04-19 NOTE — Telephone Encounter (Signed)
   Name: [name]  DOB: 1941/01/26  MRN: [id number]   Primary Cardiologist: Candee Furbish, MD  Chart reviewed as part of pre-operative protocol coverage. Patient was contacted 04/19/2021 in reference to pre-operative risk assessment for pending surgery as outlined below.  ROMIR KLIMOWICZ was last seen on 05/26/20 by Dr. Marlou Porch.  Since that day, DAKARAI MCGLOCKLIN has done well.  He can complete more than 4.0 METS without angina.   Patient with diagnosis of atrial fibrillation on Xarelto for anticoagulation.     Procedure: B/L L5-S1 LUMBAR TFESI Date of procedure: TBD   CHA2DS2-VASc Score = 3  his indicates a 3.2% annual risk of stroke. The patient's score is based upon: CHF History: No HTN History: No Diabetes History: No Stroke History: No Vascular Disease History: Yes (aortic atherosclerosis noted on chest CT) Age Score: 2 Gender Score: 0   CrCl 105 ml/min Platelet count 198K   Per office protocol, patient can hold Xarelto for 3 days prior to procedure.     Patient should restart Xarelto as soon as safely possible, at discretion of procedure MD  Therefore, based on ACC/AHA guidelines, the patient would be at acceptable risk for the planned procedure without further cardiovascular testing.   The patient was advised that if he develops new symptoms prior to surgery to contact our office to arrange for a follow-up visit, and he verbalized understanding.  I will route this recommendation to the requesting party via Epic fax function and remove from pre-op pool. Please call with questions.  Tami Lin Jyron Turman, PA 04/19/2021, 10:46 AM

## 2021-04-24 ENCOUNTER — Encounter: Payer: Self-pay | Admitting: Dermatology

## 2021-04-24 NOTE — Progress Notes (Signed)
   Follow-Up Visit   Subjective  [name] is a 80 y.o. male who presents for the following: Annual Exam (Here for annual skin exam. Patient has some areas of concern on his face blotchy areas. Did see another doctor about face and got betamethasone dip for the face. Didn't help much. No itching. Also check chest/neck area. Lesion comes and goes. History of non mole skin cancer. ).  General skin examination, recent rash on face.  History of skin cancer. Location:  Duration:  Quality:  Associated Signs/Symptoms: Modifying Factors:  Severity:  Timing: Context:   Objective  Well appearing patient in no apparent distress; mood and affect are within normal limits. Generalized, Right Thigh - Anterior Full body skin exam.  No atypical pigmented lesions.  1 possible nonmelanoma skin cancer below left neck will be biopsied.  Right Thigh - Anterior 1.3 cm waxy pink slightly raised lesion, rule out superficial carcinoma     Neck - Anterior Horn like 3 mm pink crust  Left Malar Cheek, Right Buccal Cheek, Right Parotid Area Face is really a mixture of diffuse UV damage plus patchy dermatitis.  He states that this is actually improved somewhat since using the topical cortisone, but I think is an appropriate to use a mid potency or stronger cortisoneon his face long-term.  He may get over-the-counter 1% hydrocortisone ointment should this flare.  He may also be a candidate for topical fluorouracil in the winter.    A full examination was performed including scalp, head, eyes, ears, nose, lips, neck, chest, axillae, abdomen, back, buttocks, bilateral upper extremities, bilateral lower extremities, hands, feet, fingers, toes, fingernails, and toenails. All findings within normal limits unless otherwise noted below.   Assessment & Plan    Screening for malignant neoplasm of skin (2) Right Thigh - Anterior; Generalized  Yearly skin exam.  Neoplasm of uncertain behavior of  skin Right Thigh - Anterior  Skin / nail biopsy Type of biopsy: tangential   Informed consent: discussed and consent obtained   Timeout: patient name, date of birth, surgical site, and procedure verified   Anesthesia: the lesion was anesthetized in a standard fashion   Anesthetic:  1% lidocaine w/ epinephrine 1-100,000 local infiltration Instrument used: flexible razor blade   Hemostasis achieved with: ferric subsulfate   Outcome: patient tolerated procedure well   Post-procedure details: wound care instructions given    Specimen 1 - Surgical pathology Differential Diagnosis: scc vs bcc  Check Margins: No  AK (actinic keratosis) Neck - Anterior  Destruction of lesion - Neck - Anterior Complexity: simple   Destruction method: cryotherapy   Informed consent: discussed and consent obtained   Lesion destroyed using liquid nitrogen: Yes   Cryotherapy cycles:  3 Outcome: patient tolerated procedure well with no complications   Post-procedure details: wound care instructions given    Dermatitis Right Parotid Area; Left Malar Cheek; Right Buccal Cheek  Discontinue use of prescription cortisone face.  Contact me if this flares.  Related Medications betamethasone dipropionate (DIPROLENE) 0.05 % ointment Apply topically 2 (two) times daily.      I, Lavonna Monarch, MD, have reviewed all documentation for this visit.  The documentation on 04/24/21 for the exam, diagnosis, procedures, and orders are all accurate and complete.

## 2021-04-25 ENCOUNTER — Telehealth: Payer: Self-pay

## 2021-04-25 NOTE — Telephone Encounter (Signed)
   [facility] Pre-operative Risk Assessment    Patient Name: [name]  DOB: Sep 17, 1941  MRN: [id number]   HEARTCARE STAFF: - Please ensure there is not already an duplicate clearance open for this procedure. - Under Visit Info/Reason for Call, type in Other and utilize the format Clearance MM/DD/YY or Clearance TBD. Do not use dashes or single digits. - If request is for dental extraction, please clarify the # of teeth to be extracted. - If the patient is currently at the dentist's office, call Pre-Op APP to address. If the patient is not currently in the dentist office, please route to the Pre-Op pool  Request for surgical clearance:  What type of surgery is being performed? Bilateral L5-S1 Lumbar Transforaminal Epidural Steroid Injection    When is this surgery scheduled? TBD    What type of clearance is required (medical clearance vs. Pharmacy clearance to hold med vs. Both)? Both  Are there any medications that need to be held prior to surgery and how long? Xarelto 2 days prior to procedure   Practice name and name of physician performing surgery? [facility]   What is the office phone number? [phone] Katharine Look office contact person)    7.   What is the office fax number? [phone]  8.   Anesthesia type (None, local, MAC, general) ? Block    Ulice Brilliant T 04/25/2021, 3:38 PM  _________________________________________________________________   (provider comments below)

## 2021-04-26 NOTE — Telephone Encounter (Signed)
Left a message for Katharine Look to contact office if she has not received the cardiac clearance for the patient.

## 2021-04-28 ENCOUNTER — Other Ambulatory Visit: Payer: Self-pay | Admitting: Cardiology

## 2021-05-04 DIAGNOSIS — M5416 Radiculopathy, lumbar region: Secondary | ICD-10-CM | POA: Diagnosis not present

## 2021-05-09 ENCOUNTER — Ambulatory Visit: Payer: Medicare Other | Admitting: Dermatology

## 2021-05-17 DIAGNOSIS — Z20822 Contact with and (suspected) exposure to covid-19: Secondary | ICD-10-CM | POA: Diagnosis not present

## 2021-05-22 DIAGNOSIS — M5416 Radiculopathy, lumbar region: Secondary | ICD-10-CM | POA: Diagnosis not present

## 2021-05-25 ENCOUNTER — Ambulatory Visit (INDEPENDENT_AMBULATORY_CARE_PROVIDER_SITE_OTHER): Payer: Medicare Other | Admitting: Neurology

## 2021-05-25 ENCOUNTER — Other Ambulatory Visit: Payer: Self-pay

## 2021-05-25 VITALS — BP 110/56 | HR 88 | Ht 72.0 in | Wt 204.0 lb

## 2021-05-25 DIAGNOSIS — G7000 Myasthenia gravis without (acute) exacerbation: Secondary | ICD-10-CM | POA: Diagnosis not present

## 2021-05-25 MED ORDER — PYRIDOSTIGMINE BROMIDE 60 MG PO TABS: 30.0000 mg | ORAL_TABLET | Freq: Three times a day (TID) | ORAL | 3 refills | Status: DC

## 2021-05-25 MED ORDER — PREDNISONE 10 MG PO TABS: 10.0000 mg | ORAL_TABLET | Freq: Every day | ORAL | 3 refills | Status: DC

## 2021-05-25 NOTE — Patient Instructions (Signed)
Return to clinic in 3 months

## 2021-05-25 NOTE — Progress Notes (Signed)
Follow-up Visit   Date: 05/25/21    [name] MRN: [id number] DOB: 03-09-1941   Interim History: [name] is a 80 y.o. right-handed Caucasian male with hyperlipidemia, COPD, peripheral neuropathy, SVT s/p ablation, BPH, former smoker, and diverticulitis returning to the clinic for follow-up of myasthenia gravis.  The patient was accompanied to the clinic by wife.  History of present illness: Patient was diagnosed with myasthenia gravis in August 2019 after presenting with left ptosis, facial weakness, double vision, and dysphagia. He went to the ER for these symptoms where stroke was excluded and AChR antibodies returned positive. He was started on mestinon 30mg  three times daily (8am, 4pm, and midnight).  He was started on prednisone 20mg , with no improvement.  He was hospitalized from 9/19 - 07/23/2018 with MG exacerbated and treated with IVIG with great response. His prednisone was increased to 30mg  daily and he was started on IVIG, which helped control MG symptoms. During 2020, his prednisone was tapered and he has been on prednisone 10mg  daily.    He also complains of severe neck pain which is worse with prolonged standing and neck extension.  He usually has to sit back down and rest for relief. He does not have numbness/tingling of the hands or weakness.    He has previously been evaluated for neuropathy at [facility] [place] with NCS/EMG which showed peripheral sensorimotor axonal neuropathy.  CSF testing was normal and genetic testing was normal.    UPDATE 05/25/2021:  He is here for follow-up visit.  He was at the beach last week and did well in the hot temperatures.  He denies any double vision, droopy eyelids, difficulty talking or swallowing, or limb weakness.  His wife states he has rare spells of difficulty swallowing, but also feels he may not be chewing his food.  He is compliant with prednisone 10mg  daily and mestinon 30mg  three times daily. He rarely  needs to take an extra mestinon.  He is seeing Dr. Jacelyn Grip for arthritic pain in the hip and back and getting injections.   Medications:  Current Outpatient Medications on File Prior to Visit  Medication Sig Dispense Refill   albuterol (VENTOLIN HFA) 108 (90 Base) MCG/ACT inhaler Inhale 2 puffs into the lungs every 4 (four) hours as needed.     atorvastatin (LIPITOR) 10 MG tablet Take 10 mg by mouth daily.     atorvastatin (LIPITOR) 10 MG tablet 1 tablet     betamethasone dipropionate (DIPROLENE) 0.05 % ointment Apply topically 2 (two) times daily. 30 g 0   COMBIVENT RESPIMAT 20-100 MCG/ACT AERS respimat      Cyanocobalamin (VITAMIN B-12 PO) Take 5,000 mcg by mouth daily.      doxazosin (CARDURA) 8 MG tablet Take 8 mg by mouth daily.     doxazosin (CARDURA) 8 MG tablet 1 tablet     finasteride (PROSCAR) 5 MG tablet Take 5 mg by mouth every morning.   2   finasteride (PROSCAR) 5 MG tablet 1 tablet     fluticasone (FLONASE) 50 MCG/ACT nasal spray Place 2 sprays into both nostrils daily.     gabapentin (NEURONTIN) 300 MG capsule 1 capsule     gabapentin (NEURONTIN) 300 MG capsule Take 1 capsule by mouth at bedtime.     glucosamine-chondroitin 500-400 MG tablet Take by mouth.     ipratropium (ATROVENT) 0.03 % nasal spray SMARTSIG:2 Spray(s) Both Nares Every 12 Hours     LYRICA 50 MG capsule Take 50  mg by mouth 2 (two) times daily.     meloxicam (MOBIC) 7.5 MG tablet Take 7.5 mg by mouth daily.     metoprolol succinate (TOPROL-XL) 50 MG 24 hr tablet Take 1 tablet (50 mg total) by mouth daily. Please keep upcoming appt for future refills. 90 tablet 0   montelukast (SINGULAIR) 10 MG tablet Take 10 mg by mouth at bedtime.     montelukast (SINGULAIR) 10 MG tablet 1 tablet     Multiple Vitamin (MULTIVITAMIN WITH MINERALS) TABS tablet Take 1 tablet by mouth daily.     predniSONE (DELTASONE) 10 MG tablet Take 1 tablet (10 mg total) by mouth daily with breakfast. 90 tablet 3   pyridostigmine (MESTINON) 60  MG tablet Take 0.5 tablets (30 mg total) by mouth 3 (three) times daily. Take half tablet at 8am, 1pm, and 6pm 135 tablet 3   pyridostigmine (MESTINON) 60 MG tablet 0.5 tablet     sodium chloride (OCEAN) 0.65 % SOLN nasal spray Place 1 spray into both nostrils as needed for congestion.     traMADol (ULTRAM) 50 MG tablet Take 25-50 mg by mouth 2 (two) times daily as needed.     [place] Dextrin (BENEFIBER) POWD See admin instructions.     XARELTO 20 MG TABS tablet TAKE 1 TABLET (20 MG TOTAL) BY MOUTH DAILY WITH SUPPER. 90 tablet 1   No current facility-administered medications on file prior to visit.    Allergies:  Allergies  Allergen Reactions   Ambrosia Trifida (Tall Ragweed) Allergy Skin Test Anaphylaxis   Bee Pollen Itching    Nasal congestion   Eliquis [Apixaban]     Throat tightening   Pollen Extract Other (See Comments)    Nasal congestion    Vital Signs:  BP (!) 110/56   Pulse 88   Ht 6' (1.829 m)   Wt 204 lb (92.5 kg)   SpO2 91%   BMI 27.67 kg/m    Neurological Exam: MENTAL STATUS including orientation to time, place, person, recent and remote memory, attention span and concentration, language, and fund of knowledge is normal.  Speech is not dysarthric.  CRANIAL NERVES:   Normal conjugate, extra-ocular eye movements in all directions of gaze.  Mild ptosis (stable)  Face is symmetric.  Orbicularis oculi, buccinator, and orbicularis oris is 5/5.  Tongue strength is 5/5  MOTOR:  Motor strength is 5/5 in all extremities.  Neck flexion is 5/5.   REFLEXES:  Reflexes are 2+/4 throughout, except absent at the ankles.   COORDINATION/GAIT:    Gait narrow based, stooped, mildly antalgic appearing. Unassisted, stable.   Data: Labs 06/15/2018:  AChR binding 1.69*, MUSK neg   MRI brain wo contrast 06/14/2018: Negative for acute infarct. 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   CTA head and neck 06/14/2018: 1.  Negative for large vessel occlusion, dissection, or arterial stenosis in the head or neck. There is minimal atherosclerosis. 2. Positive for generalized arterial tortuosity. Ectatic distal aortic arch and proximal descending aorta. 3. A small 15 mm area of ground-glass opacity in the right upper lobe appears stable since 2015. 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   NCS/EMG of the legs 03/13/2018:  This is an abnormal study. 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   CT chest 07/11/2018:  Negative for thymoma  MRI cervical spine wo contrast 07/14/2018: 1. No acute finding. 2. Disc and facet degeneration causes foraminal impingement that is advanced on  the left at c3-4 and on the left at C5-6. 3. Mild right foraminal narrowing at C5-6 and C6-7.   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  Hospitalized in Sept 2019, responsive to IVIG and prednisone 30mg /d.  Since this time, he has been on a tapering dose prednisone and tolerated it well. His exam is stable, with no evidence of disease manifestation.   - Continue prednisone 10mg  daily. Refilled  - Continue mestinon 30mg  three times daily.  OK to take extra 30mg  as needed for breakthrough symptoms.  Refilled  Return to clinic in 3 months  Total time spent reviewing records, interview, history/exam, documentation, and coordination of care on day of encounter:  20 min   Thank you for allowing me to participate in patient's care.  If I can answer any additional questions, I would be pleased to do so.    Sincerely,    Elbridge Magowan K. [name], DO

## 2021-05-29 ENCOUNTER — Other Ambulatory Visit: Payer: Self-pay

## 2021-05-29 ENCOUNTER — Encounter: Payer: Self-pay | Admitting: Cardiology

## 2021-05-29 ENCOUNTER — Ambulatory Visit (INDEPENDENT_AMBULATORY_CARE_PROVIDER_SITE_OTHER): Payer: Medicare Other | Admitting: Cardiology

## 2021-05-29 VITALS — BP 120/74 | HR 72 | Ht 72.0 in | Wt 204.8 lb

## 2021-05-29 DIAGNOSIS — Z79899 Other long term (current) drug therapy: Secondary | ICD-10-CM | POA: Diagnosis not present

## 2021-05-29 DIAGNOSIS — I480 Paroxysmal atrial fibrillation: Secondary | ICD-10-CM | POA: Diagnosis not present

## 2021-05-29 DIAGNOSIS — E785 Hyperlipidemia, unspecified: Secondary | ICD-10-CM | POA: Diagnosis not present

## 2021-05-29 MED ORDER — ATORVASTATIN CALCIUM 20 MG PO TABS
20.0000 mg | ORAL_TABLET | Freq: Every day | ORAL | 3 refills | Status: DC
Start: 2021-05-29 — End: 2021-09-18

## 2021-05-29 NOTE — Patient Instructions (Signed)
Medication Instructions:  Please increase your Atorvastatin to 20 mg a day.  Continue all other medications as listed.  *If you need a refill on your cardiac medications before your next appointment, please call your pharmacy*  Lab Work Please have blood work in 3 months (Lipid/ ALT)  If you have labs (blood work) drawn today and your tests are completely normal, you will receive your results only by: [place] (if you have MyChart) OR A paper copy in the mail If you have any lab test that is abnormal or we need to change your treatment, we will call you to review the results.  Follow-Up: At [facility], you and your health needs are our priority.  As part of our continuing mission to provide you with exceptional heart care, we have created designated Provider Care Teams.  These Care Teams include your primary Cardiologist (physician) and Advanced Practice Providers (APPs -  Physician Assistants and Nurse Practitioners) who all work together to provide you with the care you need, when you need it.  We recommend signing up for the patient portal called "MyChart".  Sign up information is provided on this After Visit Summary.  MyChart is used to connect with patients for Virtual Visits (Telemedicine).  Patients are able to view lab/test results, encounter notes, upcoming appointments, etc.  Non-urgent messages can be sent to your provider as well.   To learn more about what you can do with MyChart, go to [email or web address].    Your next appointment:   1 year(s)  The format for your next appointment:   In Person  Provider:   Candee Furbish, MD   Thank you for choosing [facility]!!

## 2021-05-29 NOTE — Progress Notes (Signed)
Cardiology Office Note:    Date:  05/29/2021   ID:  [name], DOB Mar 21, 1941, MRN [id number]  PCP:  [name], MD   [facility] Providers Cardiologist:  Candee Furbish, MD     Referring MD: [name], MD     History of Present Illness:    [name] is a 80 y.o. male here for follow-up of persistent atrial fibrillation with prior pericarditis with normal CRP.  Troponin had normal findings with no acute ECG changes.  Prior CT angiogram showed no acute PE but small pericardial effusion.  EF was 55%.  This occurred in [place].  Prior records reviewed.  He did not wish to take colchicine because of potential side effects.  Has a history of myasthenia gravis COPD hyperlipidemia.  At a prior visit, atrial fibrillation was noted.  He was asymptomatic with this.  Eliquis was tried but he had terrible side effects with that he states.  He is tolerating the Xarelto well but he just increased in price on his insurance plan.  Denies any fevers chills nausea vomiting syncope bleeding.  His EKG recently has shown sinus rhythm.  Past Medical History:  Diagnosis Date   Abscess of sigmoid colon due to diverticulitis 12/26/2018   Appendicitis 09/05/2014   Basal cell carcinoma 10/04/2015   left [id number]   BCC (basal cell carcinoma) 11/03/2019   left shin (txpbx)   Colon polyp    Complication of anesthesia    Difficult to arouse, Combative x 1   Diverticulitis    Diverticulosis    extensive 3'15.   Dysrhythmia    hx. 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  Many polyps.  rec f/u colonoscopy summer 2020   EUS N/A 11/17/2014   Procedure: ESOPHAGEAL ENDOSCOPIC ULTRASOUND (EUS) RADIAL;  Surgeon: [name], MD;  Location: [facility];  Service: Endoscopy;  Laterality: N/A;   FINE NEEDLE ASPIRATION N/A 11/17/2014   Procedure: FINE NEEDLE ASPIRATION (FNA) LINEAR;  Surgeon: [name], MD;  Location: [facility];  Service: Endoscopy;  Laterality: N/A;  + or- fna   FLEXIBLE SIGMOIDOSCOPY     HEMORRHOID SURGERY     banding    INGUINAL HERNIA REPAIR Bilateral 2005   '05 bilateral hernia   KNEE ARTHROSCOPY Left 2008   scope '08   LAPAROSCOPIC APPENDECTOMY N/A 09/05/2014   Procedure: APPENDECTOMY LAPAROSCOPIC;  Surgeon: [name], MD;  Location: [place];  Service: General;  Laterality: N/A;   PROCTOSCOPY N/A 12/26/2018   Procedure: RIGID PROCTOSCOPY;  Surgeon: [name], MD;  Location: [facility];  Service: General;  Laterality: N/A;  ROTATOR CUFF REPAIR Right 2016    Current Medications: Current Meds  Medication Sig   albuterol (VENTOLIN HFA) 108 (90 Base) MCG/ACT inhaler Inhale 2 puffs into the lungs every 4 (four) hours as needed.   atorvastatin (LIPITOR) 20 MG tablet Take 1 tablet (20 mg total) by mouth daily.   COMBIVENT RESPIMAT 20-100 MCG/ACT AERS respimat    Cyanocobalamin (VITAMIN B-12 PO) Take 5,000 mcg by mouth daily.    doxazosin (CARDURA) 8 MG tablet Take 8 mg by mouth daily.   doxazosin (CARDURA) 8 MG tablet 1 tablet   finasteride (PROSCAR) 5 MG tablet Take 5 mg by mouth every morning.    finasteride (PROSCAR) 5 MG tablet 1 tablet   fluticasone (FLONASE) 50 MCG/ACT nasal spray Place 2 sprays into both nostrils  daily.   gabapentin (NEURONTIN) 300 MG capsule 1 capsule   glucosamine-chondroitin 500-400 MG tablet Take by mouth.   ipratropium (ATROVENT) 0.03 % nasal spray SMARTSIG:2 Spray(s) Both Nares Every 12 Hours   LYRICA 50 MG capsule Take 50 mg by mouth 2 (two) times daily.   metoprolol succinate (TOPROL-XL) 50 MG 24 hr tablet Take 1 tablet (50 mg total) by mouth daily. Please keep upcoming appt for future refills.   montelukast (SINGULAIR) 10 MG tablet Take 10 mg by mouth at bedtime.   montelukast (SINGULAIR) 10 MG tablet 1 tablet   Multiple Vitamin (MULTIVITAMIN WITH MINERALS) TABS tablet Take 1 tablet by mouth daily.   predniSONE (DELTASONE) 10 MG tablet Take 1 tablet (10 mg total) by mouth daily with breakfast.   pyridostigmine (MESTINON) 60 MG tablet Take 0.5 tablets (30 mg total) by mouth 3 (three) times daily. Take half tablet at 8am, 1pm, and 6pm   sodium chloride (OCEAN) 0.65 % SOLN nasal spray Place 1 spray into both nostrils as needed for congestion.   Wheat Dextrin (BENEFIBER) POWD See admin instructions.   XARELTO 20 MG TABS tablet TAKE 1 TABLET (20 MG TOTAL) BY MOUTH DAILY WITH SUPPER.   [DISCONTINUED] atorvastatin (LIPITOR) 10 MG tablet Take 10 mg by mouth daily.   [DISCONTINUED] atorvastatin (LIPITOR) 10 MG tablet 1 tablet     Allergies:   Ambrosia trifida (tall ragweed) allergy skin test, Bee pollen, Eliquis [apixaban], and Pollen extract   Social History   Socioeconomic History   Marital status: Married    Spouse name: Not on file   Number of children: 3   Years of education: 14   Highest education level: Some college, no degree  Occupational History   Occupation: sales-Retired, works one day a week  Tobacco Use   Smoking status: Former    Packs/day: 1.00    Years: 47.00    Pack years: 47.00    Types: Cigarettes    Quit date: 10/29/2000    Years since quitting: 20.5   Smokeless tobacco: Never  Vaping Use   Vaping Use: Never used  Substance and Sexual Activity    Alcohol use: Yes    Alcohol/week: 1.0 standard drink    Types: 1 Standard drinks or equivalent per week    Comment: socially- nightly 1 cocktail   Drug use: No   Sexual activity: Not on file  Other Topics Concern   Not on file  Social History Narrative   Lives with wife in a one story home with a basement.  Has 3 daughters.  Works one day a week at the [employer].  Retired from [employer].  Education: some college.    Caffeine use: decaf  Right handed   One story home   Social Determinants of Health   Financial Resource Strain: Not on file  Food Insecurity: Not on file  Transportation Needs: Not on file  Physical Activity: Not on file  Stress: Not on file  Social Connections: Not on file     Family History: The patient's family history includes Allergies in his brother; Aneurysm in his brother; Pancreatic cancer in his father; Stroke in his mother. There is no history of Neuropathy.  ROS:   Please see the history of present illness.     All other systems reviewed and are negative.  EKGs/Labs/Other Studies Reviewed:    The following studies were reviewed today: ECHO 11/09/20:  1. Left ventricular ejection fraction, by estimation, is 60 to 65%. The  left ventricle has normal function. The left ventricle has no regional  wall motion abnormalities. There is mild concentric and modrate  basal-septal left ventricular hypertrophy.  Left ventricular diastolic parameters are consistent with Grade I  diastolic dysfunction (impaired relaxation).   2. Right ventricular systolic function is normal. The right ventricular  size is normal.   3. The mitral valve is normal in structure. No evidence of mitral valve  regurgitation.   4. The aortic valve is tricuspid. Aortic valve regurgitation is not  visualized. No aortic stenosis is present.   5. Aortic dilatation noted. There is mild dilatation of the aortic root,  measuring 39 mm. There is mild dilatation of the ascending aorta,   measuring 40 mm.   EKG:  EKG is sinus rhythm 72 T wave inversion fairly diffuse.  Ordered today.  No change from prior Recent Labs: No results found for requested labs within last 8760 hours.  Recent Lipid Panel    Component Value Date/Time   CHOL 138 06/14/2018 1605   TRIG 71 06/14/2018 1605   HDL 48 06/14/2018 1605   CHOLHDL 2.9 06/14/2018 1605   VLDL 14 06/14/2018 1605   LDLCALC 76 06/14/2018 1605     Risk Assessment/Calculations:          Physical Exam:    VS:  BP 120/74   Pulse 72   Ht 6' (1.829 m)   Wt 204 lb 12.8 oz (92.9 kg)   SpO2 91%   BMI 27.78 kg/m     Wt Readings from Last 3 Encounters:  05/29/21 204 lb 12.8 oz (92.9 kg)  05/25/21 204 lb (92.5 kg)  03/13/21 206 lb (93.4 kg)     GEN:  Well nourished, well developed in no acute distress HEENT: Normal NECK: No JVD; No carotid bruits LYMPHATICS: No lymphadenopathy CARDIAC: RRR, no murmurs, rubs, gallops RESPIRATORY: Mild wheeze heard right lower base. ABDOMEN: Soft, non-tender, non-distended MUSCULOSKELETAL:  No edema; No deformity  SKIN: Warm and dry NEUROLOGIC:  Alert and oriented x 3 PSYCHIATRIC:  Normal affect   ASSESSMENT:    1. Hyperlipidemia, unspecified hyperlipidemia type   2. Medication management   3. Paroxysmal atrial fibrillation (HCC)    PLAN:    In order of problems listed above:  Paroxysmal atrial fibrillation - Currently residing in sinus rhythm.  This was discovered on 05/10/2020 incidentally.  He did not tolerate the Eliquis.  He was started on Xarelto instead. - Toprol-XL 50 mg was given to improve rate control. - It is possible that an episode of acute pericarditis or pneumonia had triggered this however this was not seen in the hospital setting. - It was recommended that he continue with the Xarelto at his prior  visit last July for stroke prevention.  He was asymptomatic previously with the atrial fibrillation.  Acute pericarditis in July 2021 in [place] -   descriptions of shortness of breath relieved with IV fluids stress dose steroids hypotension myasthenia gravis.  Subtle ST segment changes noted on ECG.  CRP negative.  Did have a small pericardial effusion.  Did not like colchicine because of side effects.  He completed a course of ibuprofen.  PSVT - Dr. [name] performed ablation in 2012.  Stable.  Myasthenia gravis - Stable on prednisone by Dr. [name].  Family history of CAD/hyperlipidemia/coronary artery calcification personally reviewed on CT scan for lung cancer screening - 1 brother died of stroke and had coronary artery disease. Mother died 49.  -We will go ahead and increase his atorvastatin from 10 mg once a day to 20 mg once a day.  Optimally, would like him to be on 11 but we will start slower given his age.  Repeat lipid panel in 3 months with ALT in 3 months.  LDL goal less than 70.  COPD bronchitis former smoker - Emphysematous changes noted on CT scan previously at [facility].  Several pack-year history of smoking in the past.  Lipid panel and ALT in 3 months - Follow-up otherwise 1 year.   Medication Adjustments/Labs and Tests Ordered: Current medicines are reviewed at length with the patient today.  Concerns regarding medicines are outlined above.  Orders Placed This Encounter  Procedures   ALT   Lipid panel   EKG 12-Lead    Meds ordered this encounter  Medications   atorvastatin (LIPITOR) 20 MG tablet    Sig: Take 1 tablet (20 mg total) by mouth daily.    Dispense:  90 tablet    Refill:  3     Patient Instructions  Medication Instructions:  Please increase your Atorvastatin to 20 mg a day.  Continue all other medications as listed.  *If you need a refill on your cardiac medications before your next appointment, please call your pharmacy*  Lab Work Please have blood work in 3 months (Lipid/ ALT)  If you have labs (blood work) drawn today and your tests are completely normal, you will receive your results  only by: [place] (if you have MyChart) OR A paper copy in the mail If you have any lab test that is abnormal or we need to change your treatment, we will call you to review the results.  Follow-Up: At [facility], you and your health needs are our priority.  As part of our continuing mission to provide you with exceptional heart care, we have created designated Provider Care Teams.  These Care Teams include your primary Cardiologist (physician) and Advanced Practice Providers (APPs -  Physician Assistants and Nurse Practitioners) who all work together to provide you with the care you need, when you need it.  We recommend signing up for the patient portal called "MyChart".  Sign up information is provided on this After Visit Summary.  MyChart is used to connect with patients for Virtual Visits (Telemedicine).  Patients are able to view lab/test results, encounter notes, upcoming appointments, etc.  Non-urgent messages can be sent to your provider as well.   To learn more about what you can do with MyChart, go to [email or web address].    Your next appointment:   1 year(s)  The format for your next appointment:   In Person  Provider:   Candee Furbish, MD   Thank you for choosing [facility]!!     Signed, Candee Furbish, MD  05/29/2021 10:36 AM    [facility]

## 2021-06-06 DIAGNOSIS — N401 Enlarged prostate with lower urinary tract symptoms: Secondary | ICD-10-CM | POA: Diagnosis not present

## 2021-06-06 DIAGNOSIS — R7303 Prediabetes: Secondary | ICD-10-CM | POA: Diagnosis not present

## 2021-06-06 DIAGNOSIS — Z79899 Other long term (current) drug therapy: Secondary | ICD-10-CM | POA: Diagnosis not present

## 2021-06-06 DIAGNOSIS — G629 Polyneuropathy, unspecified: Secondary | ICD-10-CM | POA: Diagnosis not present

## 2021-06-06 DIAGNOSIS — N400 Enlarged prostate without lower urinary tract symptoms: Secondary | ICD-10-CM | POA: Diagnosis not present

## 2021-06-06 DIAGNOSIS — G8929 Other chronic pain: Secondary | ICD-10-CM | POA: Diagnosis not present

## 2021-06-06 DIAGNOSIS — I4891 Unspecified atrial fibrillation: Secondary | ICD-10-CM | POA: Diagnosis not present

## 2021-06-06 DIAGNOSIS — E785 Hyperlipidemia, unspecified: Secondary | ICD-10-CM | POA: Diagnosis not present

## 2021-06-06 DIAGNOSIS — R600 Localized edema: Secondary | ICD-10-CM | POA: Diagnosis not present

## 2021-06-06 DIAGNOSIS — J449 Chronic obstructive pulmonary disease, unspecified: Secondary | ICD-10-CM | POA: Diagnosis not present

## 2021-06-08 ENCOUNTER — Other Ambulatory Visit: Payer: Self-pay | Admitting: Cardiology

## 2021-06-08 NOTE — Telephone Encounter (Signed)
Prescription refill request for Xarelto received.   Indication: afib  Last office visit: 05/29/2021, skains Weight: 92.9 kg  Age: 80 yo  Scr: 0.86, 06/06/2021 CrCl: 88 ml/min   Refill sent.

## 2021-06-16 ENCOUNTER — Ambulatory Visit: Payer: Medicare Other | Admitting: Neurology

## 2021-06-19 ENCOUNTER — Other Ambulatory Visit: Payer: Self-pay

## 2021-06-19 MED ORDER — RIVAROXABAN 20 MG PO TABS: 20.0000 mg | ORAL_TABLET | Freq: Every day | ORAL | 3 refills | Status: DC

## 2021-06-21 DIAGNOSIS — N400 Enlarged prostate without lower urinary tract symptoms: Secondary | ICD-10-CM | POA: Diagnosis not present

## 2021-06-21 DIAGNOSIS — N401 Enlarged prostate with lower urinary tract symptoms: Secondary | ICD-10-CM | POA: Diagnosis not present

## 2021-06-21 DIAGNOSIS — E785 Hyperlipidemia, unspecified: Secondary | ICD-10-CM | POA: Diagnosis not present

## 2021-06-21 DIAGNOSIS — I4891 Unspecified atrial fibrillation: Secondary | ICD-10-CM | POA: Diagnosis not present

## 2021-06-21 DIAGNOSIS — J449 Chronic obstructive pulmonary disease, unspecified: Secondary | ICD-10-CM | POA: Diagnosis not present

## 2021-06-21 DIAGNOSIS — G8929 Other chronic pain: Secondary | ICD-10-CM | POA: Diagnosis not present

## 2021-06-21 DIAGNOSIS — E782 Mixed hyperlipidemia: Secondary | ICD-10-CM | POA: Diagnosis not present

## 2021-06-27 DIAGNOSIS — N401 Enlarged prostate with lower urinary tract symptoms: Secondary | ICD-10-CM | POA: Diagnosis not present

## 2021-06-27 DIAGNOSIS — R972 Elevated prostate specific antigen [PSA]: Secondary | ICD-10-CM | POA: Diagnosis not present

## 2021-06-27 DIAGNOSIS — N210 Calculus in bladder: Secondary | ICD-10-CM | POA: Diagnosis not present

## 2021-06-27 DIAGNOSIS — R3915 Urgency of urination: Secondary | ICD-10-CM | POA: Diagnosis not present

## 2021-06-27 DIAGNOSIS — R351 Nocturia: Secondary | ICD-10-CM | POA: Diagnosis not present

## 2021-06-27 DIAGNOSIS — R350 Frequency of micturition: Secondary | ICD-10-CM | POA: Diagnosis not present

## 2021-06-29 ENCOUNTER — Telehealth: Payer: Self-pay | Admitting: *Deleted

## 2021-06-29 ENCOUNTER — Institutional Professional Consult (permissible substitution): Payer: Medicare Other | Admitting: Student

## 2021-06-29 NOTE — Telephone Encounter (Signed)
   [facility] Pre-operative Risk Assessment    Patient Name: [name]  DOB: 03-Sep-1941 MRN: [id number]  HEARTCARE STAFF:  - IMPORTANT!!!!!! Under Visit Info/Reason for Call, type in Other and utilize the format Clearance MM/DD/YY or Clearance TBD. Do not use dashes or single digits. - Please review there is not already an duplicate clearance open for this procedure. - If request is for dental extraction, please clarify the # of teeth to be extracted. - If the patient is currently at the dentist's office, call Pre-Op Callback Staff (MA/nurse) to input urgent request.  - If the patient is not currently in the dentist office, please route to the Pre-Op pool.  Request for surgical clearance:  What type of surgery is being performed?  COLONSCOPY  When is this surgery scheduled?  09/05/21  What type of clearance is required (medical clearance vs. Pharmacy clearance to hold med vs. Both)?  BOTH  Are there any medications that need to be held prior to surgery and how long?  [facility]  Practice name and name of physician performing surgery?  [facility] / DR. [name]  What is the office phone number?  [phone]   7.   What is the office fax number?  [phone]  8.   Anesthesia type (None, local, MAC, general) ?  PROPOFOL   Jeanann Lewandowsky 06/29/2021, 4:40 PM  _________________________________________________________________   (provider comments below)

## 2021-06-30 NOTE — Telephone Encounter (Signed)
Patient with diagnosis of afib on Xarelto for anticoagulation.    Procedure: colonoscopy Date of procedure: 09/05/21  CHA2DS2-VASc Score = 4  This indicates a 4.8% annual risk of stroke. The patient's score is based upon: CHF History: No HTN History: Yes Diabetes History: No Stroke History: No Vascular Disease History: Yes Age Score: 2 Gender Score: 0  CrCl 63mL/min Platelet count 198K  Per office protocol, patient can hold Xarelto for 1-2 days prior to procedure.

## 2021-06-30 NOTE — Telephone Encounter (Signed)
   Primary Cardiologist: Candee Furbish, MD  Chart reviewed as part of pre-operative protocol coverage. Given past medical history and time since last visit, based on ACC/AHA guidelines, EDIBERTO SENS would be at acceptable risk for the planned procedure without further cardiovascular testing.   Patient with diagnosis of afib on Xarelto for anticoagulation.     Procedure: colonoscopy Date of procedure: 09/05/21   CHA2DS2-VASc Score = 4  This indicates a 4.8% annual risk of stroke. The patient's score is based upon: CHF History: No HTN History: Yes Diabetes History: No Stroke History: No Vascular Disease History: Yes Age Score: 2 Gender Score: 0   CrCl 37mL/min Platelet count 198K   Per office protocol, patient can hold Xarelto for 1-2 days prior to procedure.  I will route this recommendation to the requesting party via Epic fax function and remove from pre-op pool.  Please call with questions.  Jossie Ng. Ayo Smoak NP-C    06/30/2021, 1:28 PM [facility] [place] [address] Office [phone] Fax [phone]

## 2021-07-04 NOTE — Progress Notes (Signed)
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  Interval HPI: He says he's had no hospitalizations since last visit. Had surveillance CT Chest ordered by PCP with stable RUL nodule. Coughs every night as he lies down for bed. But after he heads to sleep he's not coughing for the most part per his wife. Still has productive cough with greenish/whitish sputum. Has been taking his flonase, singulair, saline nasal spray. Has been off of symbicort for an unknown period of time.   Has mild DOE but he's unsure if the symbicort helped with it. He has no trouble at all with aspiration liquids or solids.   Otherwise pertinent review of systems is negative.  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 Never has lived anywhere else other than [place], [place]. Pets: No pets. No birds. No feather pillows or down comforters.  Travel: none pertinent  Had ablation for some form of SVT vs AF about 6 years ago.   Past Medical History:  Diagnosis Date   Abscess of sigmoid colon due to diverticulitis 12/26/2018   Appendicitis 09/05/2014   Basal cell carcinoma 10/04/2015   left [id number]   BCC (basal cell carcinoma) 11/03/2019   left shin (txpbx)   Colon polyp    Complication of anesthesia    Difficult to arouse, Combative x 1   Diverticulitis    Diverticulosis     extensive 3'15.   Dysrhythmia    hx. 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  Many polyps.  rec f/u colonoscopy summer 2020   EUS N/A 11/17/2014   Procedure: ESOPHAGEAL ENDOSCOPIC ULTRASOUND (EUS) RADIAL;  Surgeon: [name], MD;  Location: [facility];  Service: Endoscopy;  Laterality: N/A;   FINE NEEDLE  ASPIRATION N/A 11/17/2014   Procedure: FINE NEEDLE ASPIRATION (FNA) LINEAR;  Surgeon: [name], MD;  Location: [facility];  Service: Endoscopy;  Laterality: N/A;  + or- fna   FLEXIBLE SIGMOIDOSCOPY     HEMORRHOID SURGERY     banding    INGUINAL HERNIA REPAIR Bilateral 2005   '05 bilateral hernia   KNEE ARTHROSCOPY Left 2008   scope '08   LAPAROSCOPIC APPENDECTOMY N/A 09/05/2014   Procedure: APPENDECTOMY LAPAROSCOPIC;  Surgeon: [name], MD;  Location: [facility];  Service: General;  Laterality: N/A;   PROCTOSCOPY N/A 12/26/2018   Procedure: RIGID PROCTOSCOPY;  Surgeon: [name], MD;  Location: [facility];  Service: General;  Laterality: N/A;   ROTATOR CUFF REPAIR Right 2016    Social History    Socioeconomic History   Marital status: Married    Spouse name: Not on file   Number of children: 3   Years of education: 14   Highest education level: Some college, no degree  Occupational History   Occupation: sales-Retired, works one day a week  Tobacco Use   Smoking status: Former    Packs/day: 1.00    Years: 47.00    Pack years: 47.00    Types: Cigarettes    Quit date: 10/29/2000    Years since quitting: 20.6   Smokeless tobacco: Never  Vaping Use   Vaping Use: Never used  Substance and Sexual Activity   Alcohol use: Yes    Alcohol/week: 1.0 standard drink    Types: 1 Standard drinks or equivalent per week    Comment: socially- nightly 1 cocktail   Drug use: No   Sexual activity: Not on file  Other Topics Concern   Not on file  Social History Narrative   Lives with wife in a one story home with a basement.  Has 3 daughters.  Works one day a week at the [employer].  Retired from Press photographer.  Education: some college.    Caffeine use: decaf   Right handed   One story home   Social Determinants of Health   Financial Resource Strain: Not on file  Food Insecurity: Not on file  Transportation Needs: Not on file  Physical Activity: Not on file  Stress: Not on file  Social Connections: Not on file  Intimate Partner Violence: Not on file     Allergies  Allergen Reactions   Ambrosia Trifida (Tall Ragweed) Allergy Skin Test Anaphylaxis   Bee Pollen Itching    Nasal congestion   Eliquis [Apixaban]     Throat tightening   Pollen Extract Other (See Comments)    Nasal congestion     Outpatient Medications Prior to Visit  Medication Sig Dispense Refill   albuterol (VENTOLIN HFA) 108 (90 Base) MCG/ACT inhaler Inhale 2 puffs into the lungs every 4 (four) hours as needed.     atorvastatin (LIPITOR) 20 MG tablet Take 1 tablet (20 mg total) by mouth daily. 90 tablet 3   COMBIVENT RESPIMAT 20-100 MCG/ACT AERS respimat      Cyanocobalamin (VITAMIN B-12 PO) Take 5,000 mcg by  mouth daily.      doxazosin (CARDURA) 8 MG tablet 1 tablet     finasteride (PROSCAR) 5 MG tablet 1 tablet     fluticasone (FLONASE) 50 MCG/ACT nasal spray Place 2 sprays into both nostrils daily.     gabapentin (NEURONTIN) 300 MG capsule 1 capsule     glucosamine-chondroitin 500-400 MG tablet Take by mouth.     ipratropium (ATROVENT) 0.03 %  nasal spray SMARTSIG:2 Spray(s) Both Nares Every 12 Hours     LYRICA 50 MG capsule Take 50 mg by mouth 2 (two) times daily.     metoprolol succinate (TOPROL-XL) 50 MG 24 hr tablet Take 1 tablet (50 mg total) by mouth daily. Please keep upcoming appt for future refills. 90 tablet 0   montelukast (SINGULAIR) 10 MG tablet 1 tablet     Multiple Vitamin (MULTIVITAMIN WITH MINERALS) TABS tablet Take 1 tablet by mouth daily.     predniSONE (DELTASONE) 10 MG tablet Take 1 tablet (10 mg total) by mouth daily with breakfast. 90 tablet 3   pyridostigmine (MESTINON) 60 MG tablet Take 0.5 tablets (30 mg total) by mouth 3 (three) times daily. Take half tablet at 8am, 1pm, and 6pm 135 tablet 3   rivaroxaban (XARELTO) 20 MG TABS tablet Take 1 tablet (20 mg total) by mouth daily with supper. 90 tablet 3   sodium chloride (OCEAN) 0.65 % SOLN nasal spray Place 1 spray into both nostrils as needed for congestion.     Wheat Dextrin (BENEFIBER) POWD See admin instructions.     doxazosin (CARDURA) 8 MG tablet Take 8 mg by mouth daily.     finasteride (PROSCAR) 5 MG tablet Take 5 mg by mouth every morning.   2   montelukast (SINGULAIR) 10 MG tablet Take 10 mg by mouth at bedtime.     No facility-administered medications prior to visit.       Objective:   Physical Exam:  General appearance: 80 y.o., male, NAD, conversant  Eyes: anicteric sclerae, moist conjunctivae; no lid-lag; PERRL, tracking appropriately HENT: NCAT; oropharynx, MMM, no mucosal ulcerations; normal hard and soft palate Neck: Trachea midline; no lymphadenopathy, no JVD Lungs: rhonchorous L>R, no  crackles, no wheeze, with normal respiratory effort CV: RRR, no MRGs  Abdomen: Soft, non-tender; non-distended, BS present  Extremities: No peripheral edema, radial and DP pulses present bilaterally  Skin: Normal temperature, turgor and texture; no rash Psych: Appropriate affect Neuro: Alert and oriented to person and place, no focal deficit    Vitals:   07/05/21 1431  BP: 118/74  Pulse: 66  Temp: (!) 97.3 F (36.3 C)  TempSrc: Oral  SpO2: 91%  Weight: 204 lb 11.2 oz (92.9 kg)  Height: 6' (1.829 m)   91% on RA BMI Readings from Last 3 Encounters:  07/05/21 27.76 kg/m  05/29/21 27.78 kg/m  05/25/21 27.67 kg/m   Wt Readings from Last 3 Encounters:  07/05/21 204 lb 11.2 oz (92.9 kg)  05/29/21 204 lb 12.8 oz (92.9 kg)  05/25/21 204 lb (92.5 kg)     CBC    Component Value Date/Time   WBC 6.8 04/22/2019 1419   RBC 5.10 04/22/2019 1419   HGB 14.9 04/22/2019 1419   HGB 15.2 03/08/2016 1133   HGB 15.0 11/02/2014 1104   HCT 44.4 04/22/2019 1419   HCT 46.7 06/14/2018 1605   HCT 45.6 11/02/2014 1104   PLT 229.0 04/22/2019 1419   PLT 249 03/08/2016 1133   MCV 87.2 04/22/2019 1419   MCV 88 03/08/2016 1133   MCV 89.1 11/02/2014 1104   MCH 29.6 12/27/2018 0444   MCHC 33.5 04/22/2019 1419   RDW 14.1 04/22/2019 1419   RDW 14.0 03/08/2016 1133   RDW 13.9 11/02/2014 1104   LYMPHSABS 0.7 04/22/2019 1419   LYMPHSABS 1.2 11/02/2014 1104   MONOABS 0.3 04/22/2019 1419   MONOABS 0.5 11/02/2014 1104   EOSABS 0.0 04/22/2019 1419   EOSABS 0.2 11/02/2014  1104   BASOSABS 0.0 04/22/2019 1419   BASOSABS 0.0 11/02/2014 1104    Low level peripheral eosinophilia historically  Timothy grass, ragweed aeroallergen +  Chest Imaging: CTA Chest 04/2020 OSH report reviewed by me: no PE, +bilateral atelectasis, multiple hepatic hypodensities, small PCE   CT Chest 02/10/21: personally reviewed by me and remarkable for stable RUL gg nodule, lower lobe bronchiolitis R>L, secretions in RMB,  RLL. Emphysema.  Pulmonary Functions Testing Results: No flowsheet data found.  Reviewed [facility] PFTs: 01/2020 FEV1/FVC 54, post BD FEV1 82.8% without BD response.     Echocardiogram:   05/03/20 RAX:ENMMH PCE, age appropriate diastolic parameters     Assessment & Plan:   # chronic cough: has had trials of ppi/H2B. UACS favored with persistent postnasal drip, possible component of LPR (despite negative trial of PPI) but would have expected more edematous appearing vocal cords/folds on laryngoscopy. Does additionally have bronchial wall thickening/borderline bronchiectasis and secretions in major airways, RLL changes that appear consistent with recurrent aspiration.   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 Walking oximetry today nadir of 89% with normal WOB.  # MG: MEP was just below LLN. TLC, FVC WNL 02/04/20. Periodic surveillance spiro.  # RUL GG pulmonary nodule: stable since 2015. No further need for surveillance.  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 Although he's no longer having episodes of frank aspiration with better MG control, his most recent CT Chest is suggestive of either ongoing microaspiration or difficulty with clearing secretions (weak cough) or both. He declines speech eval at this time.  - PFTs including MIP, MEP given some concern for weak cough as above - we'll hold off on restarting symbicort at least until PFTs are done. Since he's already on systemic steroid may be reasonable to try LABA/LAMA. Sent message to Dr. [name] to discuss addition of LAMA although there is no clear evidence that I could find that it could cause deterioration from MG standpoint    I spent 60 minutes dedicated to the care of this patient on the date of this encounter to include pre-visit review of records, face-to-face time with the patient discussing conditions  above, post visit ordering of testing, clinical documentation with the electronic health record, and communicating necessary findings to members of the patients care team.       Maryjane Hurter, MD [facility] 07/05/2021 2:40 PM

## 2021-07-05 ENCOUNTER — Ambulatory Visit (INDEPENDENT_AMBULATORY_CARE_PROVIDER_SITE_OTHER): Payer: Medicare Other | Admitting: Student

## 2021-07-05 ENCOUNTER — Encounter: Payer: Self-pay | Admitting: Student

## 2021-07-05 ENCOUNTER — Other Ambulatory Visit: Payer: Self-pay

## 2021-07-05 DIAGNOSIS — J449 Chronic obstructive pulmonary disease, unspecified: Secondary | ICD-10-CM | POA: Diagnosis not present

## 2021-07-05 NOTE — Patient Instructions (Signed)
-   Keep up the good work with the flonase, singulair, and aerobika flutter valve 10 slow but firm puffs twice daily  - We will schedule breathing tests for you - After I've reviewed breathing tests and heard from Dr. [name] we'll discuss adding an inhaler for COPD - See you in 6 months or sooner if you have any issues come up!

## 2021-07-15 ENCOUNTER — Other Ambulatory Visit: Payer: Self-pay | Admitting: Cardiology

## 2021-07-18 ENCOUNTER — Other Ambulatory Visit: Payer: Self-pay

## 2021-07-18 ENCOUNTER — Ambulatory Visit (INDEPENDENT_AMBULATORY_CARE_PROVIDER_SITE_OTHER): Payer: Medicare Other | Admitting: Student

## 2021-07-18 DIAGNOSIS — J449 Chronic obstructive pulmonary disease, unspecified: Secondary | ICD-10-CM | POA: Diagnosis not present

## 2021-07-18 LAB — PULMONARY FUNCTION TEST
DL/VA % pred: 89 %
DL/VA: 3.44 ml/min/mmHg/L
DLCO cor % pred: 77 %
DLCO cor: 19.8 ml/min/mmHg
DLCO unc % pred: 77 %
DLCO unc: 19.8 ml/min/mmHg
FEF 25-75 Post: 1.45 L/sec
FEF 25-75 Pre: 1.13 L/sec
FEF2575-%Change-Post: 28 %
FEF2575-%Pred-Post: 70 %
FEF2575-%Pred-Pre: 54 %
FEV1-%Change-Post: 6 %
FEV1-%Pred-Post: 82 %
FEV1-%Pred-Pre: 77 %
FEV1-Post: 2.5 L
FEV1-Pre: 2.34 L
FEV1FVC-%Change-Post: 0 %
FEV1FVC-%Pred-Pre: 86 %
FEV6-%Change-Post: 7 %
FEV6-%Pred-Post: 99 %
FEV6-%Pred-Pre: 92 %
FEV6-Post: 3.96 L
FEV6-Pre: 3.68 L
FEV6FVC-%Change-Post: 1 %
FEV6FVC-%Pred-Post: 104 %
FEV6FVC-%Pred-Pre: 103 %
FVC-%Change-Post: 6 %
FVC-%Pred-Post: 95 %
FVC-%Pred-Pre: 89 %
FVC-Post: 4.04 L
FVC-Pre: 3.79 L
Post FEV1/FVC ratio: 62 %
Post FEV6/FVC ratio: 98 %
Pre FEV1/FVC ratio: 62 %
Pre FEV6/FVC Ratio: 97 %
RV % pred: 123 %
RV: 3.41 L
TLC % pred: 97 %
TLC: 7.18 L

## 2021-07-18 NOTE — Progress Notes (Signed)
PFT done today. 

## 2021-07-20 DIAGNOSIS — N400 Enlarged prostate without lower urinary tract symptoms: Secondary | ICD-10-CM | POA: Diagnosis not present

## 2021-07-20 DIAGNOSIS — N401 Enlarged prostate with lower urinary tract symptoms: Secondary | ICD-10-CM | POA: Diagnosis not present

## 2021-07-20 DIAGNOSIS — J449 Chronic obstructive pulmonary disease, unspecified: Secondary | ICD-10-CM | POA: Diagnosis not present

## 2021-07-20 DIAGNOSIS — E782 Mixed hyperlipidemia: Secondary | ICD-10-CM | POA: Diagnosis not present

## 2021-07-20 DIAGNOSIS — G8929 Other chronic pain: Secondary | ICD-10-CM | POA: Diagnosis not present

## 2021-07-20 DIAGNOSIS — J439 Emphysema, unspecified: Secondary | ICD-10-CM | POA: Diagnosis not present

## 2021-07-20 DIAGNOSIS — I4891 Unspecified atrial fibrillation: Secondary | ICD-10-CM | POA: Diagnosis not present

## 2021-07-21 DIAGNOSIS — M1612 Unilateral primary osteoarthritis, left hip: Secondary | ICD-10-CM | POA: Diagnosis not present

## 2021-07-24 DIAGNOSIS — I4891 Unspecified atrial fibrillation: Secondary | ICD-10-CM | POA: Diagnosis not present

## 2021-07-24 DIAGNOSIS — E785 Hyperlipidemia, unspecified: Secondary | ICD-10-CM | POA: Diagnosis not present

## 2021-07-24 DIAGNOSIS — G629 Polyneuropathy, unspecified: Secondary | ICD-10-CM | POA: Diagnosis not present

## 2021-07-24 DIAGNOSIS — J439 Emphysema, unspecified: Secondary | ICD-10-CM | POA: Diagnosis not present

## 2021-07-24 DIAGNOSIS — N400 Enlarged prostate without lower urinary tract symptoms: Secondary | ICD-10-CM | POA: Diagnosis not present

## 2021-07-24 DIAGNOSIS — Z23 Encounter for immunization: Secondary | ICD-10-CM | POA: Diagnosis not present

## 2021-07-24 DIAGNOSIS — G8929 Other chronic pain: Secondary | ICD-10-CM | POA: Diagnosis not present

## 2021-07-24 DIAGNOSIS — Z79899 Other long term (current) drug therapy: Secondary | ICD-10-CM | POA: Diagnosis not present

## 2021-07-24 DIAGNOSIS — R600 Localized edema: Secondary | ICD-10-CM | POA: Diagnosis not present

## 2021-07-24 DIAGNOSIS — R7303 Prediabetes: Secondary | ICD-10-CM | POA: Diagnosis not present

## 2021-07-24 DIAGNOSIS — J449 Chronic obstructive pulmonary disease, unspecified: Secondary | ICD-10-CM | POA: Diagnosis not present

## 2021-08-01 DIAGNOSIS — E876 Hypokalemia: Secondary | ICD-10-CM | POA: Diagnosis not present

## 2021-08-03 DIAGNOSIS — M1612 Unilateral primary osteoarthritis, left hip: Secondary | ICD-10-CM | POA: Diagnosis not present

## 2021-08-22 DIAGNOSIS — H35413 Lattice degeneration of retina, bilateral: Secondary | ICD-10-CM | POA: Diagnosis not present

## 2021-08-22 DIAGNOSIS — H0102A Squamous blepharitis right eye, upper and lower eyelids: Secondary | ICD-10-CM | POA: Diagnosis not present

## 2021-08-22 DIAGNOSIS — H04123 Dry eye syndrome of bilateral lacrimal glands: Secondary | ICD-10-CM | POA: Diagnosis not present

## 2021-08-22 DIAGNOSIS — Z961 Presence of intraocular lens: Secondary | ICD-10-CM | POA: Diagnosis not present

## 2021-08-22 DIAGNOSIS — H0102B Squamous blepharitis left eye, upper and lower eyelids: Secondary | ICD-10-CM | POA: Diagnosis not present

## 2021-08-22 DIAGNOSIS — H26493 Other secondary cataract, bilateral: Secondary | ICD-10-CM | POA: Diagnosis not present

## 2021-08-22 DIAGNOSIS — H43813 Vitreous degeneration, bilateral: Secondary | ICD-10-CM | POA: Diagnosis not present

## 2021-08-25 DIAGNOSIS — M7061 Trochanteric bursitis, right hip: Secondary | ICD-10-CM | POA: Diagnosis not present

## 2021-08-29 DIAGNOSIS — E782 Mixed hyperlipidemia: Secondary | ICD-10-CM | POA: Diagnosis not present

## 2021-08-29 DIAGNOSIS — J449 Chronic obstructive pulmonary disease, unspecified: Secondary | ICD-10-CM | POA: Diagnosis not present

## 2021-08-29 DIAGNOSIS — I4891 Unspecified atrial fibrillation: Secondary | ICD-10-CM | POA: Diagnosis not present

## 2021-08-29 DIAGNOSIS — U071 COVID-19: Secondary | ICD-10-CM | POA: Diagnosis not present

## 2021-08-29 DIAGNOSIS — G7000 Myasthenia gravis without (acute) exacerbation: Secondary | ICD-10-CM | POA: Diagnosis not present

## 2021-08-31 ENCOUNTER — Other Ambulatory Visit: Payer: Medicare Other

## 2021-09-01 ENCOUNTER — Ambulatory Visit: Payer: Medicare Other | Admitting: Neurology

## 2021-09-13 DIAGNOSIS — N400 Enlarged prostate without lower urinary tract symptoms: Secondary | ICD-10-CM | POA: Diagnosis not present

## 2021-09-13 DIAGNOSIS — N401 Enlarged prostate with lower urinary tract symptoms: Secondary | ICD-10-CM | POA: Diagnosis not present

## 2021-09-13 DIAGNOSIS — I4891 Unspecified atrial fibrillation: Secondary | ICD-10-CM | POA: Diagnosis not present

## 2021-09-13 DIAGNOSIS — G8929 Other chronic pain: Secondary | ICD-10-CM | POA: Diagnosis not present

## 2021-09-13 DIAGNOSIS — J439 Emphysema, unspecified: Secondary | ICD-10-CM | POA: Diagnosis not present

## 2021-09-13 DIAGNOSIS — E782 Mixed hyperlipidemia: Secondary | ICD-10-CM | POA: Diagnosis not present

## 2021-09-13 DIAGNOSIS — J449 Chronic obstructive pulmonary disease, unspecified: Secondary | ICD-10-CM | POA: Diagnosis not present

## 2021-09-13 DIAGNOSIS — E785 Hyperlipidemia, unspecified: Secondary | ICD-10-CM | POA: Diagnosis not present

## 2021-09-14 ENCOUNTER — Other Ambulatory Visit: Payer: Medicare Other

## 2021-09-14 ENCOUNTER — Other Ambulatory Visit: Payer: Self-pay

## 2021-09-14 DIAGNOSIS — E785 Hyperlipidemia, unspecified: Secondary | ICD-10-CM | POA: Diagnosis not present

## 2021-09-14 DIAGNOSIS — Z79899 Other long term (current) drug therapy: Secondary | ICD-10-CM

## 2021-09-14 DIAGNOSIS — M7071 Other bursitis of hip, right hip: Secondary | ICD-10-CM | POA: Diagnosis not present

## 2021-09-14 LAB — LIPID PANEL
Chol/HDL Ratio: 3 ratio (ref 0.0–5.0)
Cholesterol, Total: 148 mg/dL (ref 100–199)
HDL: 50 mg/dL (ref >39)
LDL Chol Calc (NIH): 75 mg/dL (ref 0–99)
Triglycerides: 133 mg/dL (ref 0–149)
VLDL Cholesterol Cal: 23 mg/dL (ref 5–40)

## 2021-09-14 LAB — ALT: ALT: 19 IU/L (ref 0–44)

## 2021-09-15 ENCOUNTER — Encounter: Payer: Self-pay | Admitting: Cardiology

## 2021-09-15 ENCOUNTER — Encounter: Payer: Self-pay | Admitting: Student

## 2021-09-18 ENCOUNTER — Telehealth: Payer: Self-pay | Admitting: Cardiology

## 2021-09-18 DIAGNOSIS — E785 Hyperlipidemia, unspecified: Secondary | ICD-10-CM

## 2021-09-18 DIAGNOSIS — Z79899 Other long term (current) drug therapy: Secondary | ICD-10-CM

## 2021-09-18 MED ORDER — ATORVASTATIN CALCIUM 40 MG PO TABS: 40.0000 mg | ORAL_TABLET | Freq: Every day | ORAL | 3 refills | Status: DC

## 2021-09-18 MED ORDER — ALBUTEROL SULFATE HFA 108 (90 BASE) MCG/ACT IN AERS: 1.0000 | INHALATION_SPRAY | Freq: Four times a day (QID) | RESPIRATORY_TRACT | 5 refills | Status: DC | PRN

## 2021-09-18 NOTE — Telephone Encounter (Signed)
Reviewed results and orders with pt.  Rx to be sent into [facility].  Repeat lab ordered and scheduled for 12/21/21.      LDL 75 close to goal <70 Let's increase atorvastatin to 40mg  once a day. Repeat lipids in 3 months  Candee Furbish, MD

## 2021-09-18 NOTE — Telephone Encounter (Signed)
Patient returning call for lab results. 

## 2021-09-26 DIAGNOSIS — E785 Hyperlipidemia, unspecified: Secondary | ICD-10-CM | POA: Diagnosis not present

## 2021-09-26 DIAGNOSIS — R7303 Prediabetes: Secondary | ICD-10-CM | POA: Diagnosis not present

## 2021-09-26 DIAGNOSIS — N400 Enlarged prostate without lower urinary tract symptoms: Secondary | ICD-10-CM | POA: Diagnosis not present

## 2021-09-26 DIAGNOSIS — Z79899 Other long term (current) drug therapy: Secondary | ICD-10-CM | POA: Diagnosis not present

## 2021-09-26 DIAGNOSIS — Z23 Encounter for immunization: Secondary | ICD-10-CM | POA: Diagnosis not present

## 2021-09-26 DIAGNOSIS — G8929 Other chronic pain: Secondary | ICD-10-CM | POA: Diagnosis not present

## 2021-09-26 DIAGNOSIS — M25551 Pain in right hip: Secondary | ICD-10-CM | POA: Diagnosis not present

## 2021-09-26 DIAGNOSIS — I4891 Unspecified atrial fibrillation: Secondary | ICD-10-CM | POA: Diagnosis not present

## 2021-09-26 DIAGNOSIS — U071 COVID-19: Secondary | ICD-10-CM | POA: Diagnosis not present

## 2021-09-26 DIAGNOSIS — J439 Emphysema, unspecified: Secondary | ICD-10-CM | POA: Diagnosis not present

## 2021-09-26 DIAGNOSIS — R600 Localized edema: Secondary | ICD-10-CM | POA: Diagnosis not present

## 2021-09-26 DIAGNOSIS — J449 Chronic obstructive pulmonary disease, unspecified: Secondary | ICD-10-CM | POA: Diagnosis not present

## 2021-09-27 ENCOUNTER — Ambulatory Visit (INDEPENDENT_AMBULATORY_CARE_PROVIDER_SITE_OTHER): Payer: Medicare Other | Admitting: Dermatology

## 2021-09-27 ENCOUNTER — Encounter: Payer: Self-pay | Admitting: Dermatology

## 2021-09-27 ENCOUNTER — Other Ambulatory Visit: Payer: Self-pay

## 2021-09-27 DIAGNOSIS — R238 Other skin changes: Secondary | ICD-10-CM

## 2021-09-27 DIAGNOSIS — L821 Other seborrheic keratosis: Secondary | ICD-10-CM | POA: Diagnosis not present

## 2021-09-27 DIAGNOSIS — Z1283 Encounter for screening for malignant neoplasm of skin: Secondary | ICD-10-CM | POA: Diagnosis not present

## 2021-09-27 DIAGNOSIS — D1800 Hemangioma unspecified site: Secondary | ICD-10-CM | POA: Diagnosis not present

## 2021-09-27 DIAGNOSIS — L820 Inflamed seborrheic keratosis: Secondary | ICD-10-CM

## 2021-10-03 DIAGNOSIS — Z20822 Contact with and (suspected) exposure to covid-19: Secondary | ICD-10-CM | POA: Diagnosis not present

## 2021-10-09 ENCOUNTER — Encounter: Payer: Self-pay | Admitting: Neurology

## 2021-10-14 ENCOUNTER — Encounter: Payer: Self-pay | Admitting: Dermatology

## 2021-10-15 NOTE — Progress Notes (Signed)
° °  Follow-Up Visit   Subjective  [name] is a 80 y.o. male who presents for the following: Follow-up (Right neck new and growing ).  General skin check Growth below right side of the neck Location:  Duration:  Quality:  Associated Signs/Symptoms: Modifying Factors:  Severity:  Timing: Context:   Objective  Well appearing patient in no apparent distress; mood and affect are within normal limits. Torso - Posterior (Back) Full body skin examination: No atypical pigmented lesions, and no new or recurrent nonmelanoma skin cancer  Left Upper Back 6 mm brown flattopped textured papule  Mid Back Multiple 1 to 2 mm smooth red dermal papules  Right Mid Helix Compressible soft blue 3 mm dermal papule  Left Shoulder Slightly inflamed pink-tan 5 mm flattopped papule, dermoscopy supports    All skin waist up examined.   Assessment & Plan    Encounter for screening for malignant neoplasm of skin Torso - Posterior (Back)  Annual skin examination  Seborrheic keratosis Left Upper Back  Leave if stable  Hemangioma, unspecified site Mid Back  No intervention necessary  Venous lake Right Mid Helix  No treatment indicated  Seborrheic keratosis, inflamed Left Shoulder  Return if there is growth or bleeding      I, Lavonna Monarch, MD, have reviewed all documentation for this visit.  The documentation on 10/15/21 for the exam, diagnosis, procedures, and orders are all accurate and complete.

## 2021-10-17 ENCOUNTER — Telehealth: Payer: Self-pay | Admitting: Cardiology

## 2021-10-17 ENCOUNTER — Ambulatory Visit: Payer: Medicare Other | Admitting: Dermatology

## 2021-10-17 NOTE — Telephone Encounter (Signed)
Called pt and advised I am unsure who would have called.  Pt is due for lab in Feb 23 but has already been scheduled.  Not due until 8/23 for f/u with Dr Marlou Porch.  He has not had any recent testing here.  He states understanding and will call back for any further needs.

## 2021-10-17 NOTE — Telephone Encounter (Signed)
Patient states he is returning a call that he received from our office yesterday, 12/19. However, he states he is unsure of who called and what it may have been regarding. He states someone left a message asking for a call back. I do not see any current notes or results. Please return call when able.

## 2021-11-03 ENCOUNTER — Inpatient Hospital Stay (HOSPITAL_COMMUNITY)
Admission: EM | Admit: 2021-11-03 | Discharge: 2021-11-05 | DRG: 392 | Disposition: A | Discharge status: Home / Self Care | Payer: Medicare Other | Attending: Internal Medicine | Admitting: Internal Medicine

## 2021-11-03 ENCOUNTER — Other Ambulatory Visit: Payer: Self-pay

## 2021-11-03 ENCOUNTER — Emergency Department (HOSPITAL_COMMUNITY): Payer: Medicare Other

## 2021-11-03 ENCOUNTER — Encounter (HOSPITAL_COMMUNITY): Payer: Self-pay | Admitting: Internal Medicine

## 2021-11-03 DIAGNOSIS — Z79899 Other long term (current) drug therapy: Secondary | ICD-10-CM | POA: Diagnosis not present

## 2021-11-03 DIAGNOSIS — I480 Paroxysmal atrial fibrillation: Secondary | ICD-10-CM | POA: Diagnosis not present

## 2021-11-03 DIAGNOSIS — E876 Hypokalemia: Secondary | ICD-10-CM | POA: Diagnosis present

## 2021-11-03 DIAGNOSIS — Z8616 Personal history of COVID-19: Secondary | ICD-10-CM

## 2021-11-03 DIAGNOSIS — N400 Enlarged prostate without lower urinary tract symptoms: Secondary | ICD-10-CM | POA: Diagnosis present

## 2021-11-03 DIAGNOSIS — Z7952 Long term (current) use of systemic steroids: Secondary | ICD-10-CM | POA: Diagnosis not present

## 2021-11-03 DIAGNOSIS — Z87891 Personal history of nicotine dependence: Secondary | ICD-10-CM

## 2021-11-03 DIAGNOSIS — Z7901 Long term (current) use of anticoagulants: Secondary | ICD-10-CM | POA: Diagnosis not present

## 2021-11-03 DIAGNOSIS — K219 Gastro-esophageal reflux disease without esophagitis: Secondary | ICD-10-CM | POA: Diagnosis present

## 2021-11-03 DIAGNOSIS — Z823 Family history of stroke: Secondary | ICD-10-CM | POA: Diagnosis not present

## 2021-11-03 DIAGNOSIS — J449 Chronic obstructive pulmonary disease, unspecified: Secondary | ICD-10-CM | POA: Diagnosis present

## 2021-11-03 DIAGNOSIS — Z800 Family history of malignant neoplasm of digestive organs: Secondary | ICD-10-CM

## 2021-11-03 DIAGNOSIS — I10 Essential (primary) hypertension: Secondary | ICD-10-CM | POA: Diagnosis present

## 2021-11-03 DIAGNOSIS — R109 Unspecified abdominal pain: Secondary | ICD-10-CM | POA: Diagnosis not present

## 2021-11-03 DIAGNOSIS — E785 Hyperlipidemia, unspecified: Secondary | ICD-10-CM | POA: Diagnosis present

## 2021-11-03 DIAGNOSIS — Z85828 Personal history of other malignant neoplasm of skin: Secondary | ICD-10-CM

## 2021-11-03 DIAGNOSIS — G7000 Myasthenia gravis without (acute) exacerbation: Secondary | ICD-10-CM | POA: Diagnosis present

## 2021-11-03 DIAGNOSIS — K5792 Diverticulitis of intestine, part unspecified, without perforation or abscess without bleeding: Secondary | ICD-10-CM | POA: Diagnosis present

## 2021-11-03 DIAGNOSIS — K5720 Diverticulitis of large intestine with perforation and abscess without bleeding: Secondary | ICD-10-CM | POA: Diagnosis not present

## 2021-11-03 DIAGNOSIS — I959 Hypotension, unspecified: Secondary | ICD-10-CM | POA: Diagnosis not present

## 2021-11-03 DIAGNOSIS — Z9049 Acquired absence of other specified parts of digestive tract: Secondary | ICD-10-CM

## 2021-11-03 DIAGNOSIS — Z743 Need for continuous supervision: Secondary | ICD-10-CM | POA: Diagnosis not present

## 2021-11-03 DIAGNOSIS — R0902 Hypoxemia: Secondary | ICD-10-CM | POA: Diagnosis not present

## 2021-11-03 DIAGNOSIS — R0682 Tachypnea, not elsewhere classified: Secondary | ICD-10-CM | POA: Diagnosis not present

## 2021-11-03 LAB — CBC WITH DIFFERENTIAL/PLATELET
Abs Immature Granulocytes: 0.05 10*3/uL (ref 0.00–0.07)
Basophils Absolute: 0 10*3/uL (ref 0.0–0.1)
Basophils Relative: 0 %
Eosinophils Absolute: 0.1 10*3/uL (ref 0.0–0.5)
Eosinophils Relative: 2 %
HCT: 40.6 % (ref 39.0–52.0)
Hemoglobin: 13.2 g/dL (ref 13.0–17.0)
Immature Granulocytes: 1 %
Lymphocytes Relative: 10 %
Lymphs Abs: 0.8 10*3/uL (ref 0.7–4.0)
MCH: 30 pg (ref 26.0–34.0)
MCHC: 32.5 g/dL (ref 30.0–36.0)
MCV: 92.3 fL (ref 80.0–100.0)
Monocytes Absolute: 0.9 10*3/uL (ref 0.1–1.0)
Monocytes Relative: 11 %
Neutro Abs: 6 10*3/uL (ref 1.7–7.7)
Neutrophils Relative %: 76 %
Platelets: 188 10*3/uL (ref 150–400)
RBC: 4.4 MIL/uL (ref 4.22–5.81)
RDW: 14.7 % (ref 11.5–15.5)
WBC: 7.9 10*3/uL (ref 4.0–10.5)
nRBC: 0 % (ref 0.0–0.2)

## 2021-11-03 LAB — COMPREHENSIVE METABOLIC PANEL
ALT: 17 U/L (ref 0–44)
AST: 14 U/L — ABNORMAL LOW (ref 15–41)
Albumin: 3.3 g/dL — ABNORMAL LOW (ref 3.5–5.0)
Alkaline Phosphatase: 45 U/L (ref 38–126)
Anion gap: 3 — ABNORMAL LOW (ref 5–15)
BUN: 15 mg/dL (ref 8–23)
CO2: 26 mmol/L (ref 22–32)
Calcium: 8 mg/dL — ABNORMAL LOW (ref 8.9–10.3)
Chloride: 110 mmol/L (ref 98–111)
Creatinine, Ser: 0.86 mg/dL (ref 0.61–1.24)
GFR, Estimated: >60 mL/min (ref >60)
Glucose, Bld: 107 mg/dL — ABNORMAL HIGH (ref 70–99)
Potassium: 3.3 mmol/L — ABNORMAL LOW (ref 3.5–5.1)
Sodium: 139 mmol/L (ref 135–145)
Total Bilirubin: 2 mg/dL — ABNORMAL HIGH (ref 0.3–1.2)
Total Protein: 6.1 g/dL — ABNORMAL LOW (ref 6.5–8.1)

## 2021-11-03 LAB — URINALYSIS, ROUTINE W REFLEX MICROSCOPIC
Bilirubin Urine: NEGATIVE
Glucose, UA: NEGATIVE mg/dL
Hgb urine dipstick: NEGATIVE
Ketones, ur: NEGATIVE mg/dL
Leukocytes,Ua: NEGATIVE
Nitrite: NEGATIVE
Protein, ur: NEGATIVE mg/dL
Specific Gravity, Urine: 1.018 (ref 1.005–1.030)
pH: 7 (ref 5.0–8.0)

## 2021-11-03 LAB — LIPASE, BLOOD: Lipase: 31 U/L (ref 11–51)

## 2021-11-03 MED ORDER — PIPERACILLIN-TAZOBACTAM 3.375 G IVPB
3.3750 g | Freq: Three times a day (TID) | INTRAVENOUS | Status: DC
  Administered 2021-11-04 – 2021-11-05 (×5): 3.375 g via INTRAVENOUS
  Filled 2021-11-03 (×5): qty 50

## 2021-11-03 MED ORDER — ACETAMINOPHEN 325 MG PO TABS: 650.0000 mg | ORAL_TABLET | Freq: Four times a day (QID) | ORAL | Status: DC | PRN

## 2021-11-03 MED ORDER — PIPERACILLIN-TAZOBACTAM 3.375 G IVPB 30 MIN
3.3750 g | Freq: Once | INTRAVENOUS | Status: AC
  Administered 2021-11-03: 3.375 g via INTRAVENOUS
  Filled 2021-11-03: qty 50

## 2021-11-03 MED ORDER — SODIUM CHLORIDE 0.9 % IV SOLN: INTRAVENOUS | Status: DC

## 2021-11-03 MED ORDER — HEPARIN SODIUM (PORCINE) 5000 UNIT/ML IJ SOLN
5000.0000 [IU] | Freq: Three times a day (TID) | INTRAMUSCULAR | Status: DC
  Administered 2021-11-03 – 2021-11-05 (×6): 5000 [IU] via SUBCUTANEOUS
  Filled 2021-11-03 (×5): qty 1

## 2021-11-03 MED ORDER — POTASSIUM CHLORIDE 10 MEQ/100ML IV SOLN
10.0000 meq | INTRAVENOUS | Status: AC
  Administered 2021-11-03 (×2): 10 meq via INTRAVENOUS
  Filled 2021-11-03 (×2): qty 100

## 2021-11-03 MED ORDER — SODIUM CHLORIDE (PF) 0.9 % IJ SOLN
INTRAMUSCULAR | Status: AC
  Filled 2021-11-03: qty 50

## 2021-11-03 MED ORDER — ACETAMINOPHEN 650 MG RE SUPP: 650.0000 mg | Freq: Four times a day (QID) | RECTAL | Status: DC | PRN

## 2021-11-03 MED ORDER — MORPHINE SULFATE (PF) 2 MG/ML IV SOLN: 1.0000 mg | INTRAVENOUS | Status: DC | PRN

## 2021-11-03 MED ORDER — IOHEXOL 350 MG/ML SOLN
80.0000 mL | Freq: Once | INTRAVENOUS | Status: AC | PRN
  Administered 2021-11-03: 80 mL via INTRAVENOUS

## 2021-11-03 NOTE — H&P (Addendum)
History and Physical    XAIVER [name] YNW:[id number] DOB: 1940-11-17 DOA: 11/03/2021  PCP: [name], MD Patient coming from: Home  Chief Complaint: Abdominal pain  HPI: [name] is a 81 y.o. male with medical history significant of diverticulitis complicated by abscess in 2020 status post partial left colectomy, appendicitis status post appendectomy in 2015, skin cancer, hyperlipidemia, BPH, myasthenia gravis, COPD, GERD, chronic cough followed by pulmonology, paroxysmal A. fib on Xarelto, pericarditis in July 2021, PSVT status post ablation in 2012 presented to the ED complaining of lower abdominal pain.  Vital signs stable.  Labs showing no leukocytosis.  Potassium 3.3.  Creatinine 0.8, stable.  T bili 2.0, no elevation of remainder of LFTs.  Lipase normal.  UA without signs of infection.  CT showing acute sigmoid diverticulitis with abscess measuring up to 2.7 cm; no perforation.  Probable 7 mm stone within the urinary bladder.  Patient was given Zosyn, IV fluid, and potassium supplement.  General surgery consulted and felt that the abscess is not amenable to drainage.  Recommended keeping the patient n.p.o. except ice chips, antibiotics, holding Xarelto, okay with short acting chemical DVT prophylaxis.  Surgery team will see the patient in the morning.  Patient reports bilateral lower quadrant abdominal pain which has been going on for several days but became much worse today.  No nausea or vomiting.  He is having regular bowel movements.  No fevers.  Reports history of abdominal hernia repair surgeries in the past.  Reports chronic cough and shortness of breath due to COPD, no change.  Denies chest pain.  No other complaints.  Review of Systems:  All systems reviewed and apart from history of presenting illness, are negative.  Past Medical History:  Diagnosis Date   Abscess of sigmoid colon due to diverticulitis 12/26/2018   Appendicitis 09/05/2014   Basal cell carcinoma  10/04/2015   left [id number]   BCC (basal cell carcinoma) 11/03/2019   left shin (txpbx)   Colon polyp    Complication of anesthesia    Difficult to arouse, Combative x 1   Diverticulitis    Diverticulosis    extensive 3'15.   Dysrhythmia    hx. 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  Many polyps.  rec f/u colonoscopy summer 2020   EUS N/A 11/17/2014   Procedure: ESOPHAGEAL ENDOSCOPIC ULTRASOUND (EUS) RADIAL;  Surgeon: [name], MD;  Location: [facility];  Service: Endoscopy;  Laterality: N/A;   FINE NEEDLE ASPIRATION N/A 11/17/2014   Procedure: FINE NEEDLE ASPIRATION (FNA) LINEAR;  Surgeon: [name], MD;  Location: [facility];  Service: Endoscopy;  Laterality: N/A;  + or- fna   FLEXIBLE SIGMOIDOSCOPY     HEMORRHOID SURGERY     banding    INGUINAL HERNIA REPAIR Bilateral 2005   '05 bilateral hernia   KNEE ARTHROSCOPY Left 2008   scope '08   LAPAROSCOPIC APPENDECTOMY N/A 09/05/2014   Procedure: APPENDECTOMY LAPAROSCOPIC;  Surgeon: [name], MD;  Location: [facility];  Service: General;  Laterality: N/A;   PROCTOSCOPY N/A 12/26/2018   Procedure: RIGID PROCTOSCOPY;  Surgeon: [name], MD;  Location: [facility];  Service: General;  Laterality: N/A;   ROTATOR  CUFF REPAIR Right 2016     reports that he quit smoking about 21 years ago. His smoking use included cigarettes. He has a 47.00 pack-year smoking history. He has never used smokeless tobacco. He reports current alcohol use of about 1.0 standard drink per week. He reports that he does not use drugs.  Allergies  Allergen Reactions   Ambrosia Trifida (Tall Ragweed) Allergy Skin Test Anaphylaxis   Bee Pollen Itching    Nasal congestion   Eliquis [Apixaban]     Throat tightening   Pollen Extract Other (See Comments)    Nasal congestion    Family History  Problem Relation Age of Onset   Pancreatic cancer Father    Stroke Mother    Aneurysm Brother    Allergies Brother    Neuropathy Neg Hx     Prior to Admission medications   Medication Sig Start Date End Date Taking? Authorizing Provider  albuterol (VENTOLIN HFA) 108 (90 Base) MCG/ACT inhaler Inhale 2 puffs into the lungs every 4 (four) hours as needed. 03/31/20   [provider]  albuterol (VENTOLIN HFA) 108 (90 Base) MCG/ACT inhaler Inhale 1-2 puffs into the lungs every 6 (six) hours as needed for wheezing or shortness of breath. 09/18/21 10/18/21  Maryjane Hurter, MD  atorvastatin (LIPITOR) 40 MG tablet Take 1 tablet (40 mg total) by mouth daily. 09/18/21   Jerline Pain, MD  COMBIVENT RESPIMAT 20-100 MCG/ACT AERS respimat  02/10/20   [provider]  Cyanocobalamin (VITAMIN B-12 PO) Take 5,000 mcg by mouth daily.     [provider]  doxazosin (CARDURA) 8 MG tablet 1 tablet    [provider]  finasteride (PROSCAR) 5 MG tablet 1 tablet    [provider]  fluticasone (FLONASE) 50 MCG/ACT nasal spray Place 2 sprays into both nostrils daily.    [provider]  gabapentin (NEURONTIN) 300 MG capsule 1 capsule    [provider]  glucosamine-chondroitin 500-400 MG tablet Take by mouth.    [provider]  ipratropium (ATROVENT) 0.03 % nasal spray SMARTSIG:2  Spray(s) Both Nares Every 12 Hours 01/28/20   [provider]  LYRICA 50 MG capsule Take 50 mg by mouth 2 (two) times daily. 04/10/21   [provider]  metoprolol succinate (TOPROL-XL) 50 MG 24 hr tablet TAKE 1 TABLET (50 MG TOTAL) BY MOUTH DAILY. PLEASE KEEP UPCOMING APPT FOR FUTURE REFILLS. 07/17/21   Jerline Pain, MD  montelukast (SINGULAIR) 10 MG tablet 1 tablet 02/22/20   [provider]  Multiple Vitamin (MULTIVITAMIN WITH MINERALS) TABS tablet Take 1 tablet by mouth daily.    [provider]  predniSONE (DELTASONE) 10 MG tablet Take 1 tablet (10 mg total) by mouth daily with breakfast. 05/25/21   Narda Amber K, DO  pyridostigmine (MESTINON) 60 MG tablet Take 0.5 tablets (30 mg total) by mouth 3 (three) times daily. Take half tablet  at 8am, 1pm, and 6pm 05/25/21   Narda Amber K, DO  rivaroxaban (XARELTO) 20 MG TABS tablet Take 1 tablet (20 mg total) by mouth daily with supper. 06/19/21   Jerline Pain, MD  sodium chloride (OCEAN) 0.65 % SOLN nasal spray Place 1 spray into both nostrils as needed for congestion.    [provider]  Wheat Dextrin (BENEFIBER) POWD See admin instructions. 11/07/20   [provider]    Physical Exam: Vitals:   11/03/21 1845 11/03/21 1900 11/03/21 1915 11/03/21 1930  BP: 101/67 105/66 108/70 108/68  Pulse: 69 62 63 (!) 56  Resp: 11 17 12 14   Temp:      TempSrc:      SpO2: 97% 97% 97% 96%  Weight:      Height:        Physical Exam Constitutional:      General: He is not in acute distress. HENT:     Head: Normocephalic and atraumatic.  Eyes:     Extraocular Movements: Extraocular movements intact.     Conjunctiva/sclera: Conjunctivae normal.  Cardiovascular:     Rate and Rhythm: Normal rate and regular rhythm.     Pulses: Normal pulses.  Pulmonary:     Effort: Pulmonary effort is normal. No respiratory distress.     Breath sounds: Normal breath sounds. No wheezing or rales.  Abdominal:      General: Bowel sounds are normal. There is distension.     Palpations: Abdomen is soft.     Tenderness: There is abdominal tenderness. There is no guarding or rebound.     Comments: Left lower quadrant tender to palpation  Musculoskeletal:        General: No swelling or tenderness.     Cervical back: Normal range of motion and neck supple.  Skin:    General: Skin is warm and dry.  Neurological:     General: No focal deficit present.     Mental Status: He is alert and oriented to person, place, and time.     Labs on Admission: I have personally reviewed following labs and imaging studies  CBC: Recent Labs  Lab 11/03/21 1023  WBC 7.9  NEUTROABS 6.0  HGB 13.2  HCT 40.6  MCV 92.3  PLT 614   Basic Metabolic Panel: Recent Labs  Lab 11/03/21 1023  NA 139  K 3.3*  CL 110  CO2 26  GLUCOSE 107*  BUN 15  CREATININE 0.86  CALCIUM 8.0*   GFR: Estimated Creatinine Clearance: 72.7 mL/min (by C-G formula based on SCr of 0.86 mg/dL). Liver Function Tests: Recent Labs  Lab 11/03/21 1023  AST 14*  ALT 17  ALKPHOS 45  BILITOT 2.0*  PROT 6.1*  ALBUMIN 3.3*   Recent Labs  Lab 11/03/21 1023  LIPASE 31   No results for input(s): AMMONIA in the last 168 hours. Coagulation Profile: No results for input(s): INR, PROTIME in the last 168 hours. Cardiac Enzymes: No results for input(s): CKTOTAL, CKMB, CKMBINDEX, TROPONINI in the last 168 hours. BNP (last 3 results) No results for input(s): PROBNP in the last 8760 hours. HbA1C: No results for input(s): HGBA1C in the last 72 hours. CBG: No results for input(s): GLUCAP in the last 168 hours. Lipid Profile: No results for input(s): CHOL, HDL, LDLCALC, TRIG, CHOLHDL, LDLDIRECT in the last 72 hours. Thyroid Function Tests: No results for input(s): TSH, T4TOTAL, FREET4, T3FREE, THYROIDAB in the last 72 hours. Anemia Panel: No results for input(s): VITAMINB12, FOLATE, FERRITIN, TIBC,  IRON, RETICCTPCT in the last 72 hours. Urine  analysis:    Component Value Date/Time   COLORURINE YELLOW 11/03/2021 1047   APPEARANCEUR CLEAR 11/03/2021 1047   LABSPEC 1.018 11/03/2021 1047   PHURINE 7.0 11/03/2021 1047   GLUCOSEU NEGATIVE 11/03/2021 1047   HGBUR NEGATIVE 11/03/2021 [place] 11/03/2021 [place] 11/03/2021 1047   PROTEINUR NEGATIVE 11/03/2021 1047   UROBILINOGEN 0.2 09/05/2014 1218   NITRITE NEGATIVE 11/03/2021 [place] 11/03/2021 1047    Radiological Exams on Admission: CT ABDOMEN PELVIS W CONTRAST  Result Date: 11/03/2021 CLINICAL DATA:  Acute abdominal pain, non localized. Right greater than left lower quadrant pain. Onset 1 day ago. EXAM: CT ABDOMEN AND PELVIS WITH CONTRAST TECHNIQUE: Multidetector CT imaging of the abdomen and pelvis was performed using the standard protocol following bolus administration of intravenous contrast. CONTRAST:  3mL OMNIPAQUE IOHEXOL 350 MG/ML SOLN COMPARISON:  CT abdomen and pelvis 09/03/2018, 08/16/2018 FINDINGS: Lower chest: There are right greater than left posterior lower lobe curvilinear densities and areas of interlobular septal thickening. This suggests chronic interstitial thickening/scarring that is not significantly changed from 08/16/2018. Hepatobiliary: Smooth liver contours. There are again numerous scattered low-attenuation well-circumscribed lesions throughout the liver. Those that are large enough to characterize demonstrate internal fluid density consistent with cysts. Others remain too small to further characterize. These appear not significantly changed from 08/16/2018. The gallbladder is unremarkable. No intrahepatic or extrahepatic biliary ductal dilatation. Pancreas: No mass or inflammatory fat stranding. No pancreatic ductal dilatation is seen. Spleen: Normal in size without focal abnormality. Adrenals/Urinary Tract: Unchanged 5 mm nodule within the left adrenal gland. Left parapelvic cyst measuring up to 8 cm,  grossly unchanged. Right lower pole partially exophytic 5.0 cm cyst is similar to prior. There are additional smaller low-attenuation lesions within the bilateral kidneys again suggesting smaller cysts. Delayed phase imaging there is again prompt excretion of contrast into the proximal urinary collecting system. No renal stone is seen. No renal mass is seen. There again are calcific densities within the lumen of the urinary bladder layering dependently. The largest measures up to 7 mm within the posterior left aspect of the urinary bladder. A prior adjacent smaller stone is no longer visualized. There are a couple punctate calcific densities that may be within the bladder lumen versus the adjacent enlarged prostate, similar to prior. Stomach/Bowel: Surgical sutures seen within the distal sigmoid colon, likely from interval partial resection. There is again mild high-grade diverticulosis within the remaining proximal sigmoid/descending colon with new moderate wall thickening and moderate surrounding inflammatory fat stranding. There is also a centrally low-density peripherally enhancing likely abscess within the anterior superior aspect of the proximal sigmoid colon in this region measuring up to 2.7 x 1.7 x 1.7 cm (transverse by AP by craniocaudal, axial 66/2 and coronal 70/5). The terminal ileum is unremarkable. Postsurgical changes of prior appendectomy. No dilated loops of bowel to indicate bowel obstruction. Vascular/Lymphatic: No abdominal aortic aneurysm. Reproductive: The prostate is again moderately enlarged, measuring up to 6.0 cm in transverse dimension and moderately impressing on the urinary bladder base. Dystrophic calcifications are again seen within the prostate. Other: No abdominal wall hernia or abnormality. No abdominopelvic ascites. No pneumoperitoneum. Musculoskeletal: There is grade 1 anterolisthesis of L4 on L5, similar to prior. Severe L5-S1 and moderate L4-5 degenerative disc changes.  IMPRESSION: 1. Compared to 08/16/2018, interval distal partial colectomy. There is again sigmoid diverticulosis, now with inflammatory fat stranding and wall thickening indicating acute  diverticulitis. There is a small rim enhancing abscess within the proximal sigmoid colon wall measuring up to 2.7 cm. No perforation is seen. No free fluid. 2. There is a probable 7 mm stone within the urinary bladder. A prior smaller urinary bladder stone is no longer visualized. 3. Status post appendectomy. 4. Prostatomegaly. 5. Additional chronic findings as above. Electronically Signed   By: Yvonne Kendall   On: 11/03/2021 12:12    Assessment/Plan Principal Problem:   Diverticulitis Active Problems:   COPD GOLD II   Myasthenia (HCC)   Hyperlipidemia   Hypokalemia   Recurrent diverticulitis with abscess -No signs of sepsis -General surgery consulted and does not feel that the abscess is amenable to drainage. -Keep n.p.o. except ice chips -Continue Zosyn -Pain management -Hold Xarelto -General surgery okay with using short acting chemical DVT prophylaxis  Mild hypokalemia -Monitor potassium and magnesium levels, replace if low.  Hyperlipidemia Myasthenia gravis: Stable. COPD: Stable.  No signs of acute exacerbation. GERD Paroxysmal A. Fib: Stable.  Hold Xarelto. BPH -Pharmacy med rec pending.  DVT prophylaxis: Subcutaneous heparin Code Status: Patient wishes to be full code. Family Communication: Wife at bedside. Disposition Plan: Status is: Inpatient  Remains inpatient appropriate because: Needs IV antibiotics for complicated diverticulitis  Level of care: Level of care: Med-Surg  The medical decision making on this patient was of high complexity and the patient is at high risk for clinical deterioration, therefore this is a level 3 visit.  Shela Leff MD [facility]  If 7PM-7AM, please contact night-coverage [email or web address]  11/03/2021, 8:05 PM

## 2021-11-03 NOTE — ED Provider Notes (Signed)
[facility] DEPT Provider Note   CSN: [id number] Arrival date & time: 11/03/21  0950     History  Chief Complaint  Patient presents with   Abdominal Pain    [name] is a 81 y.o. male.  History includes COPD, hypertension, hyperlipidemia, diverticulitis with partial left colectomy, appendicitis status post appendectomy, history of nephrolithiasis.  Patient presents the emergency department with complaints of lower abdominal pain that started yesterday.  He has pain in both his right lower quadrant and left lower quadrant.  The right lower quadrant is actually worse than the left.  He rates his pain 10 out of 10.  Pain is worse with movement.   He is having normal bowel movements.  Denies diarrhea or constipation.  Denies any nausea or vomiting.  Denies chest pain, shortness of breath, fever, chills, dysuria, hematuria, flank pain.   Abdominal Pain     Home Medications Prior to Admission medications   Medication Sig Start Date End Date Taking? Authorizing Provider  albuterol (VENTOLIN HFA) 108 (90 Base) MCG/ACT inhaler Inhale 1-2 puffs into the lungs every 6 (six) hours as needed for wheezing or shortness of breath. 09/18/21 11/03/21 Yes Maryjane Hurter, MD  atorvastatin (LIPITOR) 40 MG tablet Take 1 tablet (40 mg total) by mouth daily. 09/18/21  Yes Jerline Pain, MD  calcium carbonate (TUMS - DOSED IN MG ELEMENTAL CALCIUM) 500 MG chewable tablet Chew 500 mg by mouth daily.   Yes [provider]  Cyanocobalamin (VITAMIN B-12 PO) Take 5,000 mcg by mouth daily.    Yes [provider]  doxazosin (CARDURA) 8 MG tablet Take 8 mg by mouth daily.   Yes [provider]  finasteride (PROSCAR) 5 MG tablet 5 mg daily.   Yes [provider]  fluticasone (FLONASE) 50 MCG/ACT nasal spray Place 2 sprays into both nostrils daily.   Yes [provider]  glucosamine-chondroitin 500-400 MG tablet Take 1 tablet by mouth  daily.   Yes [provider]  ipratropium (ATROVENT) 0.03 % nasal spray 1 spray daily. 01/28/20  Yes [provider]  metoprolol succinate (TOPROL-XL) 50 MG 24 hr tablet TAKE 1 TABLET (50 MG TOTAL) BY MOUTH DAILY. PLEASE KEEP UPCOMING APPT FOR FUTURE REFILLS. 07/17/21  Yes Jerline Pain, MD  montelukast (SINGULAIR) 10 MG tablet Take 10 mg by mouth daily. 02/22/20  Yes [provider]  Multiple Vitamin (MULTIVITAMIN WITH MINERALS) TABS tablet Take 1 tablet by mouth daily.   Yes [provider]  predniSONE (DELTASONE) 10 MG tablet Take 1 tablet (10 mg total) by mouth daily with breakfast. 05/25/21  Yes Patel, Donika K, DO  pyridostigmine (MESTINON) 60 MG tablet Take 0.5 tablets (30 mg total) by mouth 3 (three) times daily. Take half tablet at 8am, 1pm, and 6pm 05/25/21  Yes Patel, Donika K, DO  rivaroxaban (XARELTO) 20 MG TABS tablet Take 1 tablet (20 mg total) by mouth daily with supper. 06/19/21  Yes Jerline Pain, MD  sodium chloride (OCEAN) 0.65 % SOLN nasal spray Place 1 spray into both nostrils daily as needed for congestion.   Yes [provider]      Allergies    Ambrosia trifida (tall ragweed) allergy skin test, Bee pollen, Beta adrenergic blockers, Ciprofloxacin, Eliquis [apixaban], Erythromycin, and Pollen extract    Review of Systems   Review of Systems  Gastrointestinal:  Positive for abdominal pain.  All other systems reviewed and are negative.  Physical Exam Updated Vital Signs  BP 137/80 (BP Location: Right Arm)    Pulse 64    Temp 97.6 F (36.4 C) (Oral)    Resp 14    Ht 6' (1.829 m)    Wt 88.5 kg    SpO2 100%    BMI 26.45 kg/m  Physical Exam Vitals and nursing note reviewed.  Constitutional:      General: He is not in acute distress.    Appearance: Normal appearance. He is not ill-appearing, toxic-appearing or diaphoretic.  HENT:     Head: Normocephalic and atraumatic.     Nose: No nasal deformity.     Mouth/Throat:     Lips:  Pink. No lesions.     Mouth: Mucous membranes are moist. No injury, lacerations, oral lesions or angioedema.     Pharynx: Oropharynx is clear. Uvula midline. No pharyngeal swelling, oropharyngeal exudate, posterior oropharyngeal erythema or uvula swelling.  Eyes:     General: Gaze aligned appropriately. No scleral icterus.       Right eye: No discharge.        Left eye: No discharge.     Conjunctiva/sclera: Conjunctivae normal.     Right eye: Right conjunctiva is not injected. No exudate or hemorrhage.    Left eye: Left conjunctiva is not injected. No exudate or hemorrhage. Cardiovascular:     Rate and Rhythm: Normal rate and regular rhythm.     Pulses: Normal pulses.          Radial pulses are 2+ on the right side and 2+ on the left side.       Dorsalis pedis pulses are 2+ on the right side and 2+ on the left side.     Heart sounds: Normal heart sounds, S1 normal and S2 normal. Heart sounds not distant. No murmur heard.   No friction rub. No gallop. No S3 or S4 sounds.  Pulmonary:     Effort: Pulmonary effort is normal. No accessory muscle usage or respiratory distress.     Breath sounds: Normal breath sounds. No stridor. No wheezing, rhonchi or rales.  Chest:     Chest wall: No tenderness.  Abdominal:     General: Abdomen is flat. There is no distension.     Palpations: Abdomen is soft. There is no mass or pulsatile mass.     Tenderness: There is abdominal tenderness. There is guarding. There is no rebound.     Comments: Tenderness to the right lower, suprapubic, and left lower quadrant.  Patient does have positive guarding on exam.  Musculoskeletal:     Right lower leg: No edema.     Left lower leg: No edema.  Skin:    General: Skin is warm and dry.     Coloration: Skin is not jaundiced or pale.     Findings: No bruising, erythema, lesion or rash.  Neurological:     General: No focal deficit present.     Mental Status: He is alert and oriented to person, place, and time.      GCS: GCS eye subscore is 4. GCS verbal subscore is 5. GCS motor subscore is 6.  Psychiatric:        Mood and Affect: Mood normal.        Behavior: Behavior normal. Behavior is cooperative.    ED Results / Procedures / Treatments   Labs (all labs ordered are listed, but only abnormal results are displayed) Labs Reviewed  COMPREHENSIVE METABOLIC PANEL - Abnormal; Notable for the following components:  Result Value   Potassium 3.3 (*)    Glucose, Bld 107 (*)    Calcium 8.0 (*)    Total Protein 6.1 (*)    Albumin 3.3 (*)    AST 14 (*)    Total Bilirubin 2.0 (*)    Anion gap 3 (*)    All other components within normal limits  LIPASE, BLOOD  CBC WITH DIFFERENTIAL/PLATELET  URINALYSIS, ROUTINE W REFLEX MICROSCOPIC  CBC  MAGNESIUM  COMPREHENSIVE METABOLIC PANEL    EKG None  Radiology CT ABDOMEN PELVIS W CONTRAST  Result Date: 11/03/2021 CLINICAL DATA:  Acute abdominal pain, non localized. Right greater than left lower quadrant pain. Onset 1 day ago. EXAM: CT ABDOMEN AND PELVIS WITH CONTRAST TECHNIQUE: Multidetector CT imaging of the abdomen and pelvis was performed using the standard protocol following bolus administration of intravenous contrast. CONTRAST:  10mL OMNIPAQUE IOHEXOL 350 MG/ML SOLN COMPARISON:  CT abdomen and pelvis 09/03/2018, 08/16/2018 FINDINGS: Lower chest: There are right greater than left posterior lower lobe curvilinear densities and areas of interlobular septal thickening. This suggests chronic interstitial thickening/scarring that is not significantly changed from 08/16/2018. Hepatobiliary: Smooth liver contours. There are again numerous scattered low-attenuation well-circumscribed lesions throughout the liver. Those that are large enough to characterize demonstrate internal fluid density consistent with cysts. Others remain too small to further characterize. These appear not significantly changed from 08/16/2018. The gallbladder is unremarkable. No intrahepatic  or extrahepatic biliary ductal dilatation. Pancreas: No mass or inflammatory fat stranding. No pancreatic ductal dilatation is seen. Spleen: Normal in size without focal abnormality. Adrenals/Urinary Tract: Unchanged 5 mm nodule within the left adrenal gland. Left parapelvic cyst measuring up to 8 cm, grossly unchanged. Right lower pole partially exophytic 5.0 cm cyst is similar to prior. There are additional smaller low-attenuation lesions within the bilateral kidneys again suggesting smaller cysts. Delayed phase imaging there is again prompt excretion of contrast into the proximal urinary collecting system. No renal stone is seen. No renal mass is seen. There again are calcific densities within the lumen of the urinary bladder layering dependently. The largest measures up to 7 mm within the posterior left aspect of the urinary bladder. A prior adjacent smaller stone is no longer visualized. There are a couple punctate calcific densities that may be within the bladder lumen versus the adjacent enlarged prostate, similar to prior. Stomach/Bowel: Surgical sutures seen within the distal sigmoid colon, likely from interval partial resection. There is again mild high-grade diverticulosis within the remaining proximal sigmoid/descending colon with new moderate wall thickening and moderate surrounding inflammatory fat stranding. There is also a centrally low-density peripherally enhancing likely abscess within the anterior superior aspect of the proximal sigmoid colon in this region measuring up to 2.7 x 1.7 x 1.7 cm (transverse by AP by craniocaudal, axial 66/2 and coronal 70/5). The terminal ileum is unremarkable. Postsurgical changes of prior appendectomy. No dilated loops of bowel to indicate bowel obstruction. Vascular/Lymphatic: No abdominal aortic aneurysm. Reproductive: The prostate is again moderately enlarged, measuring up to 6.0 cm in transverse dimension and moderately impressing on the urinary bladder base.  Dystrophic calcifications are again seen within the prostate. Other: No abdominal wall hernia or abnormality. No abdominopelvic ascites. No pneumoperitoneum. Musculoskeletal: There is grade 1 anterolisthesis of L4 on L5, similar to prior. Severe L5-S1 and moderate L4-5 degenerative disc changes. IMPRESSION: 1. Compared to 08/16/2018, interval distal partial colectomy. There is again sigmoid diverticulosis, now with inflammatory fat stranding and wall thickening indicating acute diverticulitis. There is a small  rim enhancing abscess within the proximal sigmoid colon wall measuring up to 2.7 cm. No perforation is seen. No free fluid. 2. There is a probable 7 mm stone within the urinary bladder. A prior smaller urinary bladder stone is no longer visualized. 3. Status post appendectomy. 4. Prostatomegaly. 5. Additional chronic findings as above. Electronically Signed   By: Yvonne Kendall   On: 11/03/2021 12:12    Procedures Procedures  Patient has been on continuous cardiac monitoring during his stay.  Medications Ordered in ED Medications  0.9 %  sodium chloride infusion ( Intravenous New Bag/Given 11/03/21 1758)  sodium chloride (PF) 0.9 % injection (has no administration in time range)  potassium chloride 10 mEq in 100 mL IVPB (10 mEq Intravenous New Bag/Given 11/03/21 1952)  heparin injection 5,000 Units (has no administration in time range)  acetaminophen (TYLENOL) tablet 650 mg (has no administration in time range)    Or  acetaminophen (TYLENOL) suppository 650 mg (has no administration in time range)  piperacillin-tazobactam (ZOSYN) IVPB 3.375 g (has no administration in time range)  iohexol (OMNIPAQUE) 350 MG/ML injection 80 mL (80 mLs Intravenous Contrast Given 11/03/21 1128)  piperacillin-tazobactam (ZOSYN) IVPB 3.375 g (0 g Intravenous Stopped 11/03/21 1831)    ED Course/ Medical Decision Making/ A&P Clinical Course as of 11/03/21 2046  Fri Nov 03, 2021  1844 Verita Lamb will see patient in AM.  NPO at midnight.  [GL]    Clinical Course User Index [GL] Sherre Poot Adora Fridge, PA-C                           Medical Decision Making Problems Addressed: Diverticulitis: complicated acute illness or injury    Details: abscess  Amount and/or Complexity of Data Reviewed External Data Reviewed: labs, radiology, ECG and notes. Labs: ordered. Decision-making details documented in ED Course. Radiology: ordered and independent interpretation performed. Decision-making details documented in ED Course. ECG/medicine tests: ordered and independent interpretation performed. Decision-making details documented in ED Course. Discussion of management or test interpretation with external provider(s): Dr. Craige Cotta from General Surgery Dr. Marlowe Sax from [facility] drug management. Decision regarding hospitalization.   This is a 81 y.o. male with a PMH of COPD, hypertension, hyperlipidemia, diverticulitis with partial left colectomy, appendicitis status post appendectomy, history of nephrolithiasis, myasthenia gravis who presents to the ED with pelvic abdominal pain.  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  He once had a complication of an abscess that required partial colectomy.  Last admission noted was in 2020.   Vitals:  HDS. O2 well on RA. Afebrile. Exam only notable for significant RLQ and LLQ.  Patient overall appears well.   I personally reviewed all laboratory work and imaging. Abnormal results outlined below.  No leukocytosis. K 3.3. will replace. T bili 2.0. Urine clean. Lipase negative. CT inflammatory fat stranding and wall thickening of the sigmoid colon indicating acute diverticulitis there is a small rim-enhancing abscess within the proximal sigmoid colon wall measuring up to 2.7 cm.  No perforation present.  He was started on IV Zosyn and fluids. @ 1844 Dr. Georgette Dover from general surgery will consult on patient. Dr.  Marlowe Sax will accept patient to his service.   I have seen and evaluated this patient in conjunction with my attending physician who agrees and has made changes to the plan accordingly.  Portions of this note were generated with [employer]. Dictation errors may  occur despite best attempts at proofreading.  Final Clinical Impression(s) / ED Diagnoses Final diagnoses:  Diverticulitis    Rx / DC Orders ED Discharge Orders     None         Adolphus Birchwood, PA-C 11/03/21 2047    Drenda Freeze, MD 11/03/21 2217

## 2021-11-03 NOTE — ED Provider Triage Note (Signed)
Emergency Medicine Provider Triage Evaluation Note  [name] , a 81 y.o. male  was evaluated in triage.  Pt complains of abdominal pain right more than left lower.  Onset was a day ago, pain has been worsening, though it is unclear if he is taking anything for pain control.  No nausea, vomiting, he notes no changes in urination or bowel movements.  He has history of multiple surgeries, including appendectomy.  Review of Systems  Positive: As above Negative: No diarrhea, no constipation, no vomiting  Physical Exam  BP 120/66 (BP Location: Left Arm)    Pulse 85    Temp 97.7 F (36.5 C) (Oral)    Resp 16    SpO2 92%  Gen:   Awake, no distress speaking clearly Resp:  Normal effort  MSK:   Moves extremities without difficulty  Other:  Abdominal exam: Tenderness with guarding right greater than left lower quadrant  Medical Decision Making  Medically screening exam initiated at 10:10 AM.  Appropriate orders placed.  [name] was informed that the remainder of the evaluation will be completed by another provider, this initial triage assessment does not replace that evaluation, and the importance of remaining in the ED until their evaluation is complete.   Carmin Muskrat, MD 11/03/21 1012

## 2021-11-03 NOTE — ED Notes (Signed)
ED TO INPATIENT HANDOFF REPORT  ED Nurse Name and Phone #: Erick Colace, RN [phone]  S Name/Age/Gender [name] 81 y.o. male Room/Bed: WA25/WA25  Code Status   Code Status: Full Code  Home/SNF/Other Home Patient oriented to: self, place, time, and situation Is this baseline? Yes   Triage Complete: Triage complete  Chief Complaint Diverticulitis [K57.92]  Triage Note EMS reports generalized abdominal pain starting yesterday, worsened acutely 0800, LRQ>LLQ, rates 10/10. Not render to palpation, pain increased with mvmt, distention noted but pt states that is normal. Denies NVD, urinary or bowel sx. A&Ox4. 18ga LF, 500ccNS, 3LNC. Hx diverticulitis, hernia, appy.  BP 80/60 up to 109/62 HR 116 down to 86 Temp 99 SpO2 92% RA, 95% 3LNC hx COPD CBG 140   Allergies Allergies  Allergen Reactions   Ambrosia Trifida (Tall Ragweed) Allergy Skin Test Anaphylaxis   Bee Pollen Itching    Nasal congestion   Eliquis [Apixaban]     Throat tightening   Pollen Extract Other (See Comments)    Nasal congestion    Level of Care/Admitting Diagnosis ED Disposition     ED Disposition  Admit   Condition  --   [place]: [facility] [[id number]]  Level of Care: Med-Surg [16]  May admit patient to [facility] or [facility] if equivalent level of care is available:: Yes  Covid Evaluation: Asymptomatic Screening Protocol (No Symptoms)  Diagnosis: Diverticulitis [324401]  Admitting Physician: Shela Leff [[id number]]  Attending Physician: Shela Leff [[id number]]  Estimated length of stay: past midnight tomorrow  Certification:: I certify this patient will need inpatient services for at least 2 midnights          B Medical/Surgery History Past Medical History:  Diagnosis Date   Abscess of sigmoid colon due to diverticulitis 12/26/2018   Appendicitis 09/05/2014   Basal cell carcinoma 10/04/2015   left deltoid-[id number]   BCC (basal cell  carcinoma) 11/03/2019   left shin (txpbx)   Colon polyp    Complication of anesthesia    Difficult to arouse, Combative x 1   Diverticulitis    Diverticulosis    extensive 3'15.   Dysrhythmia    hx. 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  Many polyps.  rec f/u colonoscopy summer 2020   EUS N/A 11/17/2014   Procedure: ESOPHAGEAL ENDOSCOPIC ULTRASOUND (EUS) RADIAL;  Surgeon: [name], MD;  Location: [facility];  Service: Endoscopy;  Laterality: N/A;   FINE NEEDLE ASPIRATION N/A 11/17/2014   Procedure: FINE NEEDLE ASPIRATION (FNA) LINEAR;  Surgeon: [name], MD;  Location: [facility];  Service: Endoscopy;  Laterality:  N/A;  + or- fna   FLEXIBLE SIGMOIDOSCOPY     HEMORRHOID SURGERY     banding    INGUINAL HERNIA REPAIR Bilateral 2005   '05 bilateral hernia   KNEE ARTHROSCOPY Left 2008   scope '08   LAPAROSCOPIC APPENDECTOMY N/A 09/05/2014   Procedure: APPENDECTOMY LAPAROSCOPIC;  Surgeon: [name], MD;  Location: [place];  Service: General;  Laterality: N/A;   PROCTOSCOPY N/A 12/26/2018   Procedure: RIGID PROCTOSCOPY;  Surgeon: [name], MD;  Location: [facility];  Service: General;  Laterality: N/A;   ROTATOR CUFF REPAIR Right 2016     A IV  Location/Drains/Wounds Patient Lines/Drains/Airways Status     Active Line/Drains/Airways     Name Placement date Placement time Site Days   Peripheral IV 11/03/21 18 G Anterior;Left Forearm 11/03/21  --  Forearm  less than 1   Incision (Closed) 12/26/18 Abdomen 12/26/18  1701  -- 1043   Incision - 5 Ports Abdomen Anterior;Upper Anterior;Mid Anterior;Lower Right;Anterior;Lateral Right;Anterior;Distal 12/26/18  1659  -- 1043            Intake/Output Last 24 hours  Intake/Output Summary (Last 24 hours) at 11/03/2021 2013 Last data filed at 11/03/2021 1951 Gross per 24 hour  Intake 100 ml  Output --  Net 100 ml    Labs/Imaging Results for orders placed or performed during the hospital encounter of 11/03/21 (from the past 48 hour(s))  Comprehensive metabolic panel     Status: Abnormal   Collection Time: 11/03/21 10:23 AM  Result Value Ref Range   Sodium 139 135 - 145 mmol/L   Potassium 3.3 (L) 3.5 - 5.1 mmol/L   Chloride 110 98 - 111 mmol/L   CO2 26 22 - 32 mmol/L   Glucose, Bld 107 (H) 70 - 99 mg/dL    Comment: Glucose reference range applies only to samples taken after fasting for at least 8 hours.   BUN 15 8 - 23 mg/dL   Creatinine, Ser 0.86 0.61 - 1.24 mg/dL   Calcium 8.0 (L) 8.9 - 10.3 mg/dL   Total Protein 6.1 (L) 6.5 - 8.1 g/dL   Albumin 3.3 (L) 3.5 - 5.0 g/dL   AST 14 (L) 15 - 41 U/L   ALT 17 0 - 44 U/L   Alkaline Phosphatase 45 38 - 126 U/L   Total Bilirubin 2.0 (H) 0.3 - 1.2 mg/dL   GFR, Estimated >60 >60 mL/min    Comment: (NOTE) Calculated using the CKD-EPI Creatinine Equation (2021)    Anion gap 3 (L) 5 - 15    Comment: Performed at [facility], [place] [address] [place], [place] [address]  Lipase, blood     Status: None   Collection Time: 11/03/21 10:23 AM  Result Value Ref Range   Lipase 31 11 - 51 U/L    Comment: Performed at [facility], [place] [address] [place], [place] [address]  CBC WITH DIFFERENTIAL     Status:  None   Collection Time: 11/03/21 10:23 AM  Result Value Ref Range   WBC 7.9 4.0 - 10.5 K/uL   RBC 4.40 4.22 - 5.81 MIL/uL   Hemoglobin 13.2 13.0 - 17.0 g/dL   HCT 40.6 39.0 - 52.0 %   MCV 92.3 80.0 - 100.0 fL   MCH 30.0 26.0 - 34.0 pg   MCHC 32.5 30.0 - 36.0 g/dL   RDW 14.7 11.5 - 15.5 %   Platelets 188 150 - 400 K/uL   nRBC 0.0 0.0 -  0.2 %   Neutrophils Relative % 76 %   Neutro Abs 6.0 1.7 - 7.7 K/uL   Lymphocytes Relative 10 %   Lymphs Abs 0.8 0.7 - 4.0 K/uL   Monocytes Relative 11 %   Monocytes Absolute 0.9 0.1 - 1.0 K/uL   Eosinophils Relative 2 %   Eosinophils Absolute 0.1 0.0 - 0.5 K/uL   Basophils Relative 0 %   Basophils Absolute 0.0 0.0 - 0.1 K/uL   Immature Granulocytes 1 %   Abs Immature Granulocytes 0.05 0.00 - 0.07 K/uL    Comment: Performed at [facility], [place] [address] [place], [place] [address]  Urinalysis, Routine w reflex microscopic Urine, Clean Catch     Status: None   Collection Time: 11/03/21 10:47 AM  Result Value Ref Range   Color, Urine YELLOW YELLOW   APPearance CLEAR CLEAR   Specific Gravity, Urine 1.018 1.005 - 1.030   pH 7.0 5.0 - 8.0   Glucose, UA NEGATIVE NEGATIVE mg/dL   Hgb urine dipstick NEGATIVE NEGATIVE   Bilirubin Urine NEGATIVE NEGATIVE   Ketones, ur NEGATIVE NEGATIVE mg/dL   Protein, ur NEGATIVE NEGATIVE mg/dL   Nitrite NEGATIVE NEGATIVE   Leukocytes,Ua NEGATIVE NEGATIVE    Comment: Performed at [facility], [place] [address], [place], [place] [address]   CT ABDOMEN PELVIS W CONTRAST  Result Date: 11/03/2021 CLINICAL DATA:  Acute abdominal pain, non localized. Right greater than left lower quadrant pain. Onset 1 day ago. EXAM: CT ABDOMEN AND PELVIS WITH CONTRAST TECHNIQUE: Multidetector CT imaging of the abdomen and pelvis was performed using the standard protocol following bolus administration of intravenous contrast. CONTRAST:  109mL OMNIPAQUE IOHEXOL 350 MG/ML SOLN COMPARISON:  CT abdomen and  pelvis 09/03/2018, 08/16/2018 FINDINGS: Lower chest: There are right greater than left posterior lower lobe curvilinear densities and areas of interlobular septal thickening. This suggests chronic interstitial thickening/scarring that is not significantly changed from 08/16/2018. Hepatobiliary: Smooth liver contours. There are again numerous scattered low-attenuation well-circumscribed lesions throughout the liver. Those that are large enough to characterize demonstrate internal fluid density consistent with cysts. Others remain too small to further characterize. These appear not significantly changed from 08/16/2018. The gallbladder is unremarkable. No intrahepatic or extrahepatic biliary ductal dilatation. Pancreas: No mass or inflammatory fat stranding. No pancreatic ductal dilatation is seen. Spleen: Normal in size without focal abnormality. Adrenals/Urinary Tract: Unchanged 5 mm nodule within the left adrenal gland. Left parapelvic cyst measuring up to 8 cm, grossly unchanged. Right lower pole partially exophytic 5.0 cm cyst is similar to prior. There are additional smaller low-attenuation lesions within the bilateral kidneys again suggesting smaller cysts. Delayed phase imaging there is again prompt excretion of contrast into the proximal urinary collecting system. No renal stone is seen. No renal mass is seen. There again are calcific densities within the lumen of the urinary bladder layering dependently. The largest measures up to 7 mm within the posterior left aspect of the urinary bladder. A prior adjacent smaller stone is no longer visualized. There are a couple punctate calcific densities that may be within the bladder lumen versus the adjacent enlarged prostate, similar to prior. Stomach/Bowel: Surgical sutures seen within the distal sigmoid colon, likely from interval partial resection. There is again mild high-grade diverticulosis within the remaining proximal sigmoid/descending colon with new  moderate wall thickening and moderate surrounding inflammatory fat stranding. There is also a centrally low-density peripherally enhancing likely abscess within the anterior superior aspect of the proximal sigmoid colon in  this region measuring up to 2.7 x 1.7 x 1.7 cm (transverse by AP by craniocaudal, axial 66/2 and coronal 70/5). The terminal ileum is unremarkable. Postsurgical changes of prior appendectomy. No dilated loops of bowel to indicate bowel obstruction. Vascular/Lymphatic: No abdominal aortic aneurysm. Reproductive: The prostate is again moderately enlarged, measuring up to 6.0 cm in transverse dimension and moderately impressing on the urinary bladder base. Dystrophic calcifications are again seen within the prostate. Other: No abdominal wall hernia or abnormality. No abdominopelvic ascites. No pneumoperitoneum. Musculoskeletal: There is grade 1 anterolisthesis of L4 on L5, similar to prior. Severe L5-S1 and moderate L4-5 degenerative disc changes. IMPRESSION: 1. Compared to 08/16/2018, interval distal partial colectomy. There is again sigmoid diverticulosis, now with inflammatory fat stranding and wall thickening indicating acute diverticulitis. There is a small rim enhancing abscess within the proximal sigmoid colon wall measuring up to 2.7 cm. No perforation is seen. No free fluid. 2. There is a probable 7 mm stone within the urinary bladder. A prior smaller urinary bladder stone is no longer visualized. 3. Status post appendectomy. 4. Prostatomegaly. 5. Additional chronic findings as above. Electronically Signed   By: Yvonne Kendall   On: 11/03/2021 12:12    Pending Labs Unresulted Labs (From admission, onward)     Start     Ordered   11/04/21 0500  CBC  Tomorrow morning,   R        11/03/21 2001   11/04/21 0500  Magnesium  Tomorrow morning,   R        11/03/21 2001   11/04/21 0500  Comprehensive metabolic panel  Tomorrow morning,   R        11/03/21 2001             Vitals/Pain Today's Vitals   11/03/21 1845 11/03/21 1900 11/03/21 1915 11/03/21 1930  BP: 101/67 105/66 108/70 108/68  Pulse: 69 62 63 (!) 56  Resp: 11 17 12 14   Temp:      TempSrc:      SpO2: 97% 97% 97% 96%  Weight:      Height:      PainSc:        Isolation Precautions No active isolations  Medications Medications  0.9 %  sodium chloride infusion ( Intravenous New Bag/Given 11/03/21 1758)  sodium chloride (PF) 0.9 % injection (has no administration in time range)  potassium chloride 10 mEq in 100 mL IVPB (10 mEq Intravenous New Bag/Given 11/03/21 1952)  heparin injection 5,000 Units (has no administration in time range)  acetaminophen (TYLENOL) tablet 650 mg (has no administration in time range)    Or  acetaminophen (TYLENOL) suppository 650 mg (has no administration in time range)  iohexol (OMNIPAQUE) 350 MG/ML injection 80 mL (80 mLs Intravenous Contrast Given 11/03/21 1128)  piperacillin-tazobactam (ZOSYN) IVPB 3.375 g (0 g Intravenous Stopped 11/03/21 1831)    Mobility walks Low fall risk   Focused Assessments    R Recommendations: See Admitting Provider Note  Report given to:   Additional Notes:

## 2021-11-03 NOTE — Progress Notes (Addendum)
Patient ID: [name], male   DOB: 04-15-1941, 81 y.o.   MRN: [id number]  Have reviewed the chart.  Patient is being admitted to [facility].  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 [name] in 2020.  Small abscess - not amenable to drain.  No systemic signs of sepsis.    Bowel rest - ice chips only IV antibiotics Hold Xarelto - OK to use short-acting chemical DVT prophylaxis  Formal consult to follow  [name]. Douglas Dover, MD, [facility]  General Surgery   11/03/2021 6:53 PM

## 2021-11-03 NOTE — ED Triage Notes (Signed)
EMS reports generalized abdominal pain starting yesterday, worsened acutely 0800, LRQ>LLQ, rates 10/10. Not render to palpation, pain increased with mvmt, distention noted but pt states that is normal. Denies NVD, urinary or bowel sx. A&Ox4. 18ga LF, 500ccNS, 3LNC. Hx diverticulitis, hernia, appy.  BP 80/60 up to 109/62 HR 116 down to 86 Temp 99 SpO2 92% RA, 95% 3LNC hx COPD CBG 140

## 2021-11-04 ENCOUNTER — Encounter (HOSPITAL_COMMUNITY): Payer: Self-pay | Admitting: Internal Medicine

## 2021-11-04 LAB — RESP PANEL BY RT-PCR (FLU A&B, COVID) ARPGX2
Influenza A by PCR: NEGATIVE
Influenza B by PCR: NEGATIVE
SARS Coronavirus 2 by RT PCR: NEGATIVE

## 2021-11-04 LAB — COMPREHENSIVE METABOLIC PANEL
ALT: 15 U/L (ref 0–44)
AST: 13 U/L — ABNORMAL LOW (ref 15–41)
Albumin: 3.1 g/dL — ABNORMAL LOW (ref 3.5–5.0)
Alkaline Phosphatase: 41 U/L (ref 38–126)
Anion gap: 9 (ref 5–15)
BUN: 12 mg/dL (ref 8–23)
CO2: 27 mmol/L (ref 22–32)
Calcium: 8.2 mg/dL — ABNORMAL LOW (ref 8.9–10.3)
Chloride: 105 mmol/L (ref 98–111)
Creatinine, Ser: 0.79 mg/dL (ref 0.61–1.24)
GFR, Estimated: >60 mL/min (ref >60)
Glucose, Bld: 97 mg/dL (ref 70–99)
Potassium: 3.4 mmol/L — ABNORMAL LOW (ref 3.5–5.1)
Sodium: 141 mmol/L (ref 135–145)
Total Bilirubin: 1.8 mg/dL — ABNORMAL HIGH (ref 0.3–1.2)
Total Protein: 5.7 g/dL — ABNORMAL LOW (ref 6.5–8.1)

## 2021-11-04 LAB — CBC
HCT: 37.7 % — ABNORMAL LOW (ref 39.0–52.0)
Hemoglobin: 12.3 g/dL — ABNORMAL LOW (ref 13.0–17.0)
MCH: 30.2 pg (ref 26.0–34.0)
MCHC: 32.6 g/dL (ref 30.0–36.0)
MCV: 92.6 fL (ref 80.0–100.0)
Platelets: 198 10*3/uL (ref 150–400)
RBC: 4.07 MIL/uL — ABNORMAL LOW (ref 4.22–5.81)
RDW: 14.6 % (ref 11.5–15.5)
WBC: 7.7 10*3/uL (ref 4.0–10.5)
nRBC: 0 % (ref 0.0–0.2)

## 2021-11-04 LAB — MAGNESIUM: Magnesium: 2.2 mg/dL (ref 1.7–2.4)

## 2021-11-04 MED ORDER — POTASSIUM CHLORIDE 10 MEQ/100ML IV SOLN
10.0000 meq | INTRAVENOUS | Status: AC
  Administered 2021-11-04 (×4): 10 meq via INTRAVENOUS
  Filled 2021-11-04 (×4): qty 100

## 2021-11-04 MED ORDER — PYRIDOSTIGMINE BROMIDE 60 MG PO TABS
30.0000 mg | ORAL_TABLET | Freq: Three times a day (TID) | ORAL | Status: DC
  Administered 2021-11-04 – 2021-11-05 (×6): 30 mg via ORAL
  Filled 2021-11-04 (×8): qty 0.5

## 2021-11-04 MED ORDER — FLUTICASONE PROPIONATE 50 MCG/ACT NA SUSP
2.0000 | Freq: Every day | NASAL | Status: DC
  Administered 2021-11-04 – 2021-11-05 (×2): 2 via NASAL
  Filled 2021-11-04: qty 16

## 2021-11-04 MED ORDER — PYRIDOSTIGMINE BROMIDE 60 MG PO TABS
30.0000 mg | ORAL_TABLET | Freq: Three times a day (TID) | ORAL | Status: DC
  Filled 2021-11-04: qty 0.5

## 2021-11-04 MED ORDER — FINASTERIDE 5 MG PO TABS
5.0000 mg | ORAL_TABLET | Freq: Every day | ORAL | Status: DC
  Administered 2021-11-04 – 2021-11-05 (×2): 5 mg via ORAL
  Filled 2021-11-04 (×2): qty 1

## 2021-11-04 MED ORDER — MONTELUKAST SODIUM 10 MG PO TABS
10.0000 mg | ORAL_TABLET | Freq: Every day | ORAL | Status: DC
  Administered 2021-11-04 – 2021-11-05 (×2): 10 mg via ORAL
  Filled 2021-11-04 (×2): qty 1

## 2021-11-04 MED ORDER — SALINE SPRAY 0.65 % NA SOLN
1.0000 | Freq: Every day | NASAL | Status: DC | PRN
  Filled 2021-11-04: qty 44

## 2021-11-04 MED ORDER — HYDROCORTISONE SOD SUC (PF) 100 MG IJ SOLR
50.0000 mg | Freq: Three times a day (TID) | INTRAMUSCULAR | Status: DC
  Administered 2021-11-04: 50 mg via INTRAVENOUS
  Filled 2021-11-04 (×3): qty 1

## 2021-11-04 MED ORDER — PREDNISONE 5 MG PO TABS
10.0000 mg | ORAL_TABLET | Freq: Every day | ORAL | Status: DC
  Administered 2021-11-04 – 2021-11-05 (×2): 10 mg via ORAL
  Filled 2021-11-04 (×2): qty 2

## 2021-11-04 NOTE — Progress Notes (Signed)
MD Marlowe Sax was secured message because patient's wife is upset that his home medication isn't ordered. Pharmacy said medication reconciliation has been completed. MD Rathore said to refer to [facility] floor coverage to order medications. MD Elgergawy was secured messaged, no response at the moment.

## 2021-11-04 NOTE — Progress Notes (Signed)
Patient ID: [name], male   DOB: 01-Mar-1941, 81 y.o.   MRN: [id number] Appropriate home meds has been resumed, blood pressure is soft, holding home antihypertensive regimen, patient is steroid-dependent, been on prednisone for 3 years, will start on hydrocortisone 50 mg IV every 8 hours. Phillips Climes MD

## 2021-11-04 NOTE — Progress Notes (Signed)
PROGRESS NOTE    [name]  AQT:[id number] DOB: December 22, 1940 DOA: 11/03/2021 PCP: [name], MD    Brief Narrative:  [name] is a 81 year old male with past medical history significant for diverticulitis with abscess 2020 with partial left colectomy by Dr. [name], appendicitis s/p appendectomy, skin cancer, hyperlipidemia, myasthenia gravis, COPD, GERD, A. fib on Xarelto, pericarditis, PSVT s/p ablation 2012 who presented to [facility] ED on 1/6 with chief complaint of abdominal pain.  Onset for several days, progressing localized to bilateral lower quadrants.  No nausea/vomiting, reports regular bowel movements.  No fevers.  Chronic cough and shortness of breath are at baseline due to his underlying COPD.  Denies chest pain and no other specific complaints.  In the ED, temperature 97.7 F, HR 85, RR 16, BP 120/66, SPO2 92% on room air.  Sodium 139, potassium 3.3, chloride 110, CO2 26, glucose 107, BUN 15, creatinine 0.86.  Lipase 31, AST 14, ALT 17, total bilirubin 2.0.  WBC 7.9, hemoglobin 13.2, platelets 188.  Urinalysis unrevealing.  CT abdomen/pelvis with sigmoid diverticulosis now with inflammatory fat stranding and wall thickening likely secondary to acute diverticulitis with small rim-enhancing abscess within the proximal sigmoid colon wall measuring up to 2.7 cm without perforation and no free fluid.  General surgery was consulted.  Patient was started empiric antibiotics.  Hospital service consulted for further evaluation and management of acute diverticulitis with abscess.   Assessment & Plan:   Principal Problem:   Diverticulitis Active Problems:   COPD GOLD II   Myasthenia ([place])   Hyperlipidemia   Hypokalemia   Acute diverticulitis with abscess, recurrent Patient presenting with progressive lower quadrant abdominal pain.  History of diverticulitis/abscess in the past s/p partial left colectomy by Dr. [name] in 2020.  Patient is afebrile without leukocytosis.  Reports  normal bowel movements without nausea/vomiting.  CT abdomen/pelvis with acute diverticulitis with small rim-enhancing abscess within the proximal sigmoid colon. --General surgery following, appreciate assistance --Diet advanced to clear liquid today by surgery --NS at 75 mL/h --Zosyn --CBC daily --Further per general surgery  Hypokalemia Potassium 3.4 this morning, magnesium 2.2.  Will replete potassium. --Repeat electrolytes in the a.m.  Myasthenia gravis Follows with neurology outpatient; Dr. [name]. --Pyridostigmine 30 mg p.o. 3 times daily --Prednisone 10 mg p.o. daily  COPD, stable without exacerbation --Singulair  HLD: --Holding home atorvastatin  Paroxysmal atrial fibrillation Essential hypertension Home regimen includes doxazosin 8 mg p.o. daily, metoprolol succinate 50 mg p.o. daily, Xarelto 20 mg p.o. daily. --BP 130/72 this morning with HR 67. --Holding home Xarelto, doxazosin and metoprolol succinate for now --Monitor BP closely --Monitor on telemetry  BPH:  --Finasteride 5 mg p.o. daily   DVT prophylaxis: heparin injection 5,000 Units Start: 11/03/21 2200   Code Status: Full Code Family Communication: Updated spouse present at bedside this morning  Disposition Plan:  Level of care: Med-Surg Status is: Inpatient  Remains inpatient appropriate because: Continues to require IV antibiotics, general surgery slowly advancing diet to clear liquids today.  Remains on IV fluids.  Anticipate discharge home in 2-3 days pending general surgery recommendations.     Consultants:  General surgery  Procedures:  None  Antimicrobials:  Zosyn 1/6>>    Subjective: Patient seen examined at bedside, resting comfortably.  Lying in bed.  Spouse present.  Spouse requesting restart of his prednisone rather than the IV Solu-Cortef which was ordered overnight.  Patient reports bowel movement, no current abdominal discomfort.  Discussed with general surgery PA this  morning,  advancing diet to clears today and remains on IV antibiotics.  No other questions or concerns from patient or spouse this morning.  Denies headache, no dizziness, no chest pain, palpitations, no shortness of breath greater than his typical baseline, no nausea/vomiting/diarrhea, no weakness, no fatigue, no paresthesias.  No acute events overnight per nursing staff.  Objective: Vitals:   11/03/21 2045 11/04/21 0148 11/04/21 0517 11/04/21 0945  BP: 137/80 115/71 130/72 122/70  Pulse: 64 68 67 69  Resp: 14 14 14 14   Temp: 97.6 F (36.4 C) 97.6 F (36.4 C) 97.7 F (36.5 C) (!) 97.5 F (36.4 C)  TempSrc: Oral Oral Oral Oral  SpO2: 100% 94% 92% 95%  Weight:      Height:        Intake/Output Summary (Last 24 hours) at 11/04/2021 1107 Last data filed at 11/04/2021 0600 Gross per 24 hour  Intake 1740.89 ml  Output 200 ml  Net 1540.89 ml   Filed Weights   11/03/21 1009  Weight: 88.5 kg    Examination:  General exam: Appears calm and comfortable  Respiratory system: Clear to auscultation. Respiratory effort normal.  On room air Cardiovascular system: S1 & S2 heard, RRR. No JVD, murmurs, rubs, gallops or clicks. No pedal edema. Gastrointestinal system: Abdomen is nondistended, soft and nontender. No organomegaly or masses felt. Normal bowel sounds heard. Central nervous system: Alert and oriented. No focal neurological deficits. Extremities: Symmetric 5 x 5 power. Skin: No rashes, lesions or ulcers Psychiatry: Judgement and insight appear normal. Mood & affect appropriate.     Data Reviewed: I have personally reviewed following labs and imaging studies  CBC: Recent Labs  Lab 11/03/21 1023 11/04/21 0509  WBC 7.9 7.7  NEUTROABS 6.0  --   HGB 13.2 12.3*  HCT 40.6 37.7*  MCV 92.3 92.6  PLT 188 357   Basic Metabolic Panel: Recent Labs  Lab 11/03/21 1023 11/04/21 0509  NA 139 141  K 3.3* 3.4*  CL 110 105  CO2 26 27  GLUCOSE 107* 97  BUN 15 12  CREATININE 0.86 0.79   CALCIUM 8.0* 8.2*  MG  --  2.2   GFR: Estimated Creatinine Clearance: 78.1 mL/min (by C-G formula based on SCr of 0.79 mg/dL). Liver Function Tests: Recent Labs  Lab 11/03/21 1023 11/04/21 0509  AST 14* 13*  ALT 17 15  ALKPHOS 45 41  BILITOT 2.0* 1.8*  PROT 6.1* 5.7*  ALBUMIN 3.3* 3.1*   Recent Labs  Lab 11/03/21 1023  LIPASE 31   No results for input(s): AMMONIA in the last 168 hours. Coagulation Profile: No results for input(s): INR, PROTIME in the last 168 hours. Cardiac Enzymes: No results for input(s): CKTOTAL, CKMB, CKMBINDEX, TROPONINI in the last 168 hours. BNP (last 3 results) No results for input(s): PROBNP in the last 8760 hours. HbA1C: No results for input(s): HGBA1C in the last 72 hours. CBG: No results for input(s): GLUCAP in the last 168 hours. Lipid Profile: No results for input(s): CHOL, HDL, LDLCALC, TRIG, CHOLHDL, LDLDIRECT in the last 72 hours. Thyroid Function Tests: No results for input(s): TSH, T4TOTAL, FREET4, T3FREE, THYROIDAB in the last 72 hours. Anemia Panel: No results for input(s): VITAMINB12, FOLATE, FERRITIN, TIBC, IRON, RETICCTPCT in the last 72 hours. Sepsis Labs: No results for input(s): PROCALCITON, LATICACIDVEN in the last 168 hours.  No results found for this or any previous visit (from the past 240 hour(s)).       Radiology Studies: CT ABDOMEN PELVIS W  CONTRAST  Result Date: 11/03/2021 CLINICAL DATA:  Acute abdominal pain, non localized. Right greater than left lower quadrant pain. Onset 1 day ago. EXAM: CT ABDOMEN AND PELVIS WITH CONTRAST TECHNIQUE: Multidetector CT imaging of the abdomen and pelvis was performed using the standard protocol following bolus administration of intravenous contrast. CONTRAST:  35mL OMNIPAQUE IOHEXOL 350 MG/ML SOLN COMPARISON:  CT abdomen and pelvis 09/03/2018, 08/16/2018 FINDINGS: Lower chest: There are right greater than left posterior lower lobe curvilinear densities and areas of interlobular  septal thickening. This suggests chronic interstitial thickening/scarring that is not significantly changed from 08/16/2018. Hepatobiliary: Smooth liver contours. There are again numerous scattered low-attenuation well-circumscribed lesions throughout the liver. Those that are large enough to characterize demonstrate internal fluid density consistent with cysts. Others remain too small to further characterize. These appear not significantly changed from 08/16/2018. The gallbladder is unremarkable. No intrahepatic or extrahepatic biliary ductal dilatation. Pancreas: No mass or inflammatory fat stranding. No pancreatic ductal dilatation is seen. Spleen: Normal in size without focal abnormality. Adrenals/Urinary Tract: Unchanged 5 mm nodule within the left adrenal gland. Left parapelvic cyst measuring up to 8 cm, grossly unchanged. Right lower pole partially exophytic 5.0 cm cyst is similar to prior. There are additional smaller low-attenuation lesions within the bilateral kidneys again suggesting smaller cysts. Delayed phase imaging there is again prompt excretion of contrast into the proximal urinary collecting system. No renal stone is seen. No renal mass is seen. There again are calcific densities within the lumen of the urinary bladder layering dependently. The largest measures up to 7 mm within the posterior left aspect of the urinary bladder. A prior adjacent smaller stone is no longer visualized. There are a couple punctate calcific densities that may be within the bladder lumen versus the adjacent enlarged prostate, similar to prior. Stomach/Bowel: Surgical sutures seen within the distal sigmoid colon, likely from interval partial resection. There is again mild high-grade diverticulosis within the remaining proximal sigmoid/descending colon with new moderate wall thickening and moderate surrounding inflammatory fat stranding. There is also a centrally low-density peripherally enhancing likely abscess within  the anterior superior aspect of the proximal sigmoid colon in this region measuring up to 2.7 x 1.7 x 1.7 cm (transverse by AP by craniocaudal, axial 66/2 and coronal 70/5). The terminal ileum is unremarkable. Postsurgical changes of prior appendectomy. No dilated loops of bowel to indicate bowel obstruction. Vascular/Lymphatic: No abdominal aortic aneurysm. Reproductive: The prostate is again moderately enlarged, measuring up to 6.0 cm in transverse dimension and moderately impressing on the urinary bladder base. Dystrophic calcifications are again seen within the prostate. Other: No abdominal wall hernia or abnormality. No abdominopelvic ascites. No pneumoperitoneum. Musculoskeletal: There is grade 1 anterolisthesis of L4 on L5, similar to prior. Severe L5-S1 and moderate L4-5 degenerative disc changes. IMPRESSION: 1. Compared to 08/16/2018, interval distal partial colectomy. There is again sigmoid diverticulosis, now with inflammatory fat stranding and wall thickening indicating acute diverticulitis. There is a small rim enhancing abscess within the proximal sigmoid colon wall measuring up to 2.7 cm. No perforation is seen. No free fluid. 2. There is a probable 7 mm stone within the urinary bladder. A prior smaller urinary bladder stone is no longer visualized. 3. Status post appendectomy. 4. Prostatomegaly. 5. Additional chronic findings as above. Electronically Signed   By: Yvonne Kendall   On: 11/03/2021 12:12        Scheduled Meds:  finasteride  5 mg Oral Daily   fluticasone  2 spray Each Nare Daily  heparin  5,000 Units Subcutaneous Q8H   montelukast  10 mg Oral Daily   predniSONE  10 mg Oral Q breakfast   pyridostigmine  30 mg Oral TID   Continuous Infusions:  sodium chloride 125 mL/hr at 11/04/21 0433   piperacillin-tazobactam (ZOSYN)  IV 3.375 g (11/04/21 0823)   potassium chloride 10 mEq (11/04/21 1004)     LOS: 1 day    Time spent: 42 minutes spent on chart review, discussion with  nursing staff, consultants, updating family and interview/physical exam; more than 50% of that time was spent in counseling and/or coordination of care.    Izeah Vossler J [place] DO [facility] Available via Epic secure chat 7am-7pm After these hours, please refer to coverage provider listed on [email or web address] 11/04/2021, 11:07 AM

## 2021-11-04 NOTE — Plan of Care (Signed)
  Problem: Activity: Goal: Risk for activity intolerance will decrease Outcome: Progressing   Problem: Pain Managment: Goal: General experience of comfort will improve Outcome: Progressing   Problem: Safety: Goal: Ability to remain free from injury will improve Outcome: Progressing   

## 2021-11-04 NOTE — Consult Note (Signed)
[name] 04/24/1941  [id number].    Requesting MD: Dr. Eric [place] Chief Complaint/Reason for Consult: diverticulitis with abscess  HPI:  This is a very pleasant 81 yo male with previous LAR for diverticulitis by Dr. [name] on 12/26/18, a fib on Xarelto, COPD, and myasthenia gravis who was in his normal state of health until a couple of days ago when he began having some sharp RLQ and suprapubic type pain.  He denies any N/V/D.  Last had a BM this morning with no blood present.  He denies any fevers, chest pain, SOB, etc.  His pain persisted and he went to the ED for evaluation.  Upon work up, he was noted to be AF with a normal WBC, but CT scan concerning for diverticulitis with a 2.7cm abscess that appears to possibly be intramural.  He has been admitted and started on abx therapy.  We have been asked to see for further evaluation.  ROS: ROS: Please see HPI, otherwise all other systems have been reviewed and are negative currently  Family History  Problem Relation Age of Onset   Pancreatic cancer Father    Stroke Mother    Aneurysm Brother    Allergies Brother    Neuropathy Neg Hx     Past Medical History:  Diagnosis Date   Abscess of sigmoid colon due to diverticulitis 12/26/2018   Appendicitis 09/05/2014   Basal cell carcinoma 10/04/2015   left [id number]   BCC (basal cell carcinoma) 11/03/2019   left shin (txpbx)   Colon polyp    Complication of anesthesia    Difficult to arouse, Combative x 1   Diverticulitis    Diverticulosis    extensive 3'15.   Dysrhythmia    hx. 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  Many polyps.  rec f/u colonoscopy summer 2020   EUS N/A 11/17/2014   Procedure: ESOPHAGEAL ENDOSCOPIC ULTRASOUND (EUS) RADIAL;  Surgeon: [name], MD;  Location: [facility];  Service: Endoscopy;  Laterality: N/A;   FINE NEEDLE ASPIRATION N/A 11/17/2014   Procedure: FINE NEEDLE ASPIRATION (FNA) LINEAR;  Surgeon: [name], MD;  Location: [facility];  Service: Endoscopy;  Laterality: N/A;  + or- fna   FLEXIBLE SIGMOIDOSCOPY     HEMORRHOID SURGERY     banding    INGUINAL HERNIA REPAIR Bilateral 2005   '05 bilateral hernia   KNEE ARTHROSCOPY Left 2008   scope '08   LAPAROSCOPIC APPENDECTOMY N/A 09/05/2014   Procedure: APPENDECTOMY LAPAROSCOPIC;  Surgeon: [name], MD;  Location: [facility];  Service: General;  Laterality: N/A;   PROCTOSCOPY N/A 12/26/2018  Procedure: RIGID PROCTOSCOPY;  Surgeon: [name], MD;  Location: [facility];  Service: General;  Laterality: N/A;   ROTATOR CUFF REPAIR Right 2016    Social History:  reports that he quit smoking about 21 years ago. His smoking use included cigarettes. He has a 47.00 pack-year smoking history. He has never used smokeless tobacco. He reports current alcohol use of about 1.0 standard drink per week. He reports that he does not use drugs.  Allergies:  Allergies  Allergen Reactions   Ambrosia Trifida (Tall Ragweed) Allergy Skin Test Anaphylaxis   Bee Pollen Itching    Nasal congestion   Beta Adrenergic Blockers     Other reaction(s): MG   Ciprofloxacin     Other reaction(s): myasthenia gravis   Eliquis [Apixaban]     Throat tightening   Erythromycin     Other  reaction(s): MG   Pollen Extract Other (See Comments)    Nasal congestion    Medications Prior to Admission  Medication Sig Dispense Refill   albuterol (VENTOLIN HFA) 108 (90 Base) MCG/ACT inhaler Inhale 1-2 puffs into the lungs every 6 (six) hours as needed for wheezing or shortness of breath. 18 g 5   atorvastatin (LIPITOR) 40 MG tablet Take 1 tablet (40 mg total) by mouth daily. 90 tablet 3   calcium carbonate (TUMS - DOSED IN MG ELEMENTAL CALCIUM) 500 MG chewable tablet Chew 500 mg by mouth daily.     Cyanocobalamin (VITAMIN B-12 PO) Take 5,000 mcg by mouth daily.      doxazosin (CARDURA) 8 MG tablet Take 8 mg by mouth daily.     finasteride (PROSCAR) 5 MG tablet 5 mg daily.     fluticasone (FLONASE) 50 MCG/ACT nasal spray Place 2 sprays into both nostrils daily.     glucosamine-chondroitin 500-400 MG tablet Take 1 tablet by mouth daily.     ipratropium (ATROVENT) 0.03 % nasal spray 1 spray daily.     metoprolol succinate (TOPROL-XL) 50 MG 24 hr tablet TAKE 1 TABLET (50 MG TOTAL) BY MOUTH DAILY. PLEASE KEEP UPCOMING APPT FOR FUTURE REFILLS. 90 tablet 3   montelukast (SINGULAIR) 10 MG tablet Take 10 mg by mouth daily.     Multiple Vitamin (MULTIVITAMIN WITH MINERALS) TABS tablet Take 1 tablet by mouth daily.     predniSONE (DELTASONE) 10 MG tablet Take 1 tablet (10 mg total) by mouth daily with breakfast. 90 tablet 3   pyridostigmine (MESTINON) 60 MG tablet Take 0.5 tablets (30 mg total) by mouth 3 (three) times daily. Take half tablet at 8am, 1pm, and 6pm 135 tablet 3   rivaroxaban (XARELTO) 20 MG TABS tablet Take 1 tablet (20 mg total) by mouth daily with supper. 90 tablet 3   sodium chloride (OCEAN) 0.65 % SOLN nasal spray Place 1 spray into both nostrils daily as needed for congestion.       Physical Exam: Blood pressure 122/70, pulse 69, temperature (!) 97.5 F (36.4 C), temperature source Oral, resp. rate 14, height 6' (1.829 m), weight 88.5 kg, SpO2 95 %. General: pleasant, WD,  WN white male who is laying in bed in NAD HEENT: head is normocephalic, atraumatic.  Sclera are noninjected.  PERRL.  Ears and nose without any masses or lesions.  Mouth is pink and moist Heart: regular, rate, and rhythm.  Normal s1,s2. No obvious murmurs, gallops, or rubs noted.  Palpable radial and pedal pulses bilaterally Lungs: CTAB, no wheezes, rhonchi, or rales noted.  Respiratory effort nonlabored Abd: soft, mild RLQ  and LLQ tenderness, no guarding, rebounding, or peritoneal signs, ND, +BS, no masses, hernias, or organomegaly MS: all 4 extremities are symmetrical with no cyanosis, clubbing, or edema. Skin: warm and dry with no masses, lesions, or rashes Neuro: Cranial nerves 2-12 grossly intact, sensation is normal throughout Psych: A&Ox3 with an appropriate affect.   Results for orders placed or performed during the hospital encounter of 11/03/21 (from the past 48 hour(s))  Comprehensive metabolic panel     Status: Abnormal   Collection Time: 11/03/21 10:23 AM  Result Value Ref Range   Sodium 139 135 - 145 mmol/L   Potassium 3.3 (L) 3.5 - 5.1 mmol/L   Chloride 110 98 - 111 mmol/L   CO2 26 22 - 32 mmol/L   Glucose, Bld 107 (H) 70 - 99 mg/dL    Comment: Glucose reference range applies only to samples taken after fasting for at least 8 hours.   BUN 15 8 - 23 mg/dL   Creatinine, Ser 0.86 0.61 - 1.24 mg/dL   Calcium 8.0 (L) 8.9 - 10.3 mg/dL   Total Protein 6.1 (L) 6.5 - 8.1 g/dL   Albumin 3.3 (L) 3.5 - 5.0 g/dL   AST 14 (L) 15 - 41 U/L   ALT 17 0 - 44 U/L   Alkaline Phosphatase 45 38 - 126 U/L   Total Bilirubin 2.0 (H) 0.3 - 1.2 mg/dL   GFR, Estimated >60 >60 mL/min    Comment: (NOTE) Calculated using the CKD-EPI Creatinine Equation (2021)    Anion gap 3 (L) 5 - 15    Comment: Performed at [facility], [place] [address] [place], [place] [address]  Lipase, blood     Status: None   Collection Time: 11/03/21 10:23 AM  Result Value Ref Range   Lipase 31 11 - 51  U/L    Comment: Performed at [facility], [place] [address] [place], [place] [address]  CBC WITH DIFFERENTIAL     Status: None   Collection Time: 11/03/21 10:23 AM  Result Value Ref Range   WBC 7.9 4.0 - 10.5 K/uL   RBC 4.40 4.22 - 5.81 MIL/uL   Hemoglobin 13.2 13.0 - 17.0 g/dL   HCT 40.6 39.0 - 52.0 %   MCV 92.3 80.0 - 100.0 fL   MCH 30.0 26.0 - 34.0 pg   MCHC 32.5 30.0 - 36.0 g/dL   RDW 14.7 11.5 - 15.5 %   Platelets 188 150 - 400 K/uL   nRBC 0.0 0.0 - 0.2 %   Neutrophils Relative % 76 %   Neutro Abs 6.0 1.7 - 7.7 K/uL   Lymphocytes Relative 10 %   Lymphs Abs 0.8 0.7 - 4.0 K/uL   Monocytes Relative 11 %   Monocytes Absolute 0.9 0.1 - 1.0 K/uL   Eosinophils Relative 2 %   Eosinophils Absolute 0.1 0.0 - 0.5 K/uL   Basophils Relative 0 %   Basophils Absolute 0.0 0.0 - 0.1 K/uL   Immature Granulocytes 1 %   Abs Immature Granulocytes 0.05 0.00 - 0.07 K/uL    Comment: Performed at [facility], [place] [address] [place], [place] [address]  Urinalysis, Routine w reflex microscopic Urine, Clean Catch     Status: None   Collection Time: 11/03/21 10:47 AM  Result Value Ref Range   Color, Urine YELLOW YELLOW   APPearance CLEAR CLEAR   Specific Gravity, Urine 1.018 1.005 - 1.030   pH 7.0 5.0 - 8.0   Glucose, UA  NEGATIVE NEGATIVE mg/dL   Hgb urine dipstick NEGATIVE NEGATIVE   Bilirubin Urine NEGATIVE NEGATIVE   Ketones, ur NEGATIVE NEGATIVE mg/dL   Protein, ur NEGATIVE NEGATIVE mg/dL   Nitrite NEGATIVE NEGATIVE   Leukocytes,Ua NEGATIVE NEGATIVE    Comment: Performed at [place] [address] [place], [place] [address]  CBC     Status: Abnormal   Collection Time: 11/04/21  5:09 AM  Result Value Ref Range   WBC 7.7 4.0 - 10.5 K/uL   RBC 4.07 (L) 4.22 - 5.81 MIL/uL   Hemoglobin 12.3 (L) 13.0 - 17.0 g/dL   HCT 37.7 (L) 39.0 - 52.0 %   MCV 92.6 80.0 - 100.0 fL   MCH 30.2 26.0 - 34.0 pg   MCHC 32.6 30.0 - 36.0 g/dL   RDW  14.6 11.5 - 15.5 %   Platelets 198 150 - 400 K/uL   nRBC 0.0 0.0 - 0.2 %    Comment: Performed at [facility], [place] [address], [place], [place] [address]  Magnesium     Status: None   Collection Time: 11/04/21  5:09 AM  Result Value Ref Range   Magnesium 2.2 1.7 - 2.4 mg/dL    Comment: Performed at [facility], [place] [address], [place], [place] [address]  Comprehensive metabolic panel     Status: Abnormal   Collection Time: 11/04/21  5:09 AM  Result Value Ref Range   Sodium 141 135 - 145 mmol/L   Potassium 3.4 (L) 3.5 - 5.1 mmol/L   Chloride 105 98 - 111 mmol/L   CO2 27 22 - 32 mmol/L   Glucose, Bld 97 70 - 99 mg/dL    Comment: Glucose reference range applies only to samples taken after fasting for at least 8 hours.   BUN 12 8 - 23 mg/dL   Creatinine, Ser 0.79 0.61 - 1.24 mg/dL   Calcium 8.2 (L) 8.9 - 10.3 mg/dL   Total Protein 5.7 (L) 6.5 - 8.1 g/dL   Albumin 3.1 (L) 3.5 - 5.0 g/dL   AST 13 (L) 15 - 41 U/L   ALT 15 0 - 44 U/L   Alkaline Phosphatase 41 38 - 126 U/L   Total Bilirubin 1.8 (H) 0.3 - 1.2 mg/dL   GFR, Estimated >60 >60 mL/min    Comment: (NOTE) Calculated using the CKD-EPI Creatinine Equation (2021)    Anion gap 9 5 - 15    Comment: Performed at [facility], [place] [address], [place], [place] [address]   CT ABDOMEN PELVIS W CONTRAST  Result Date: 11/03/2021 CLINICAL DATA:  Acute abdominal pain, non localized. Right greater than left lower quadrant pain. Onset 1 day ago. EXAM: CT ABDOMEN AND PELVIS WITH CONTRAST TECHNIQUE: Multidetector CT imaging of the abdomen and pelvis was performed using the standard protocol following bolus administration of intravenous contrast. CONTRAST:  101mL OMNIPAQUE IOHEXOL 350 MG/ML SOLN COMPARISON:  CT abdomen and pelvis 09/03/2018, 08/16/2018 FINDINGS: Lower chest: There are right greater than left posterior lower lobe curvilinear densities and areas of interlobular septal  thickening. This suggests chronic interstitial thickening/scarring that is not significantly changed from 08/16/2018. Hepatobiliary: Smooth liver contours. There are again numerous scattered low-attenuation well-circumscribed lesions throughout the liver. Those that are large enough to characterize demonstrate internal fluid density consistent with cysts. Others remain too small to further characterize. These appear not significantly changed from 08/16/2018. The gallbladder is unremarkable. No intrahepatic or extrahepatic biliary ductal dilatation. Pancreas: No mass or  inflammatory fat stranding. No pancreatic ductal dilatation is seen. Spleen: Normal in size without focal abnormality. Adrenals/Urinary Tract: Unchanged 5 mm nodule within the left adrenal gland. Left parapelvic cyst measuring up to 8 cm, grossly unchanged. Right lower pole partially exophytic 5.0 cm cyst is similar to prior. There are additional smaller low-attenuation lesions within the bilateral kidneys again suggesting smaller cysts. Delayed phase imaging there is again prompt excretion of contrast into the proximal urinary collecting system. No renal stone is seen. No renal mass is seen. There again are calcific densities within the lumen of the urinary bladder layering dependently. The largest measures up to 7 mm within the posterior left aspect of the urinary bladder. A prior adjacent smaller stone is no longer visualized. There are a couple punctate calcific densities that may be within the bladder lumen versus the adjacent enlarged prostate, similar to prior. Stomach/Bowel: Surgical sutures seen within the distal sigmoid colon, likely from interval partial resection. There is again mild high-grade diverticulosis within the remaining proximal sigmoid/descending colon with new moderate wall thickening and moderate surrounding inflammatory fat stranding. There is also a centrally low-density peripherally enhancing likely abscess within the  anterior superior aspect of the proximal sigmoid colon in this region measuring up to 2.7 x 1.7 x 1.7 cm (transverse by AP by craniocaudal, axial 66/2 and coronal 70/5). The terminal ileum is unremarkable. Postsurgical changes of prior appendectomy. No dilated loops of bowel to indicate bowel obstruction. Vascular/Lymphatic: No abdominal aortic aneurysm. Reproductive: The prostate is again moderately enlarged, measuring up to 6.0 cm in transverse dimension and moderately impressing on the urinary bladder base. Dystrophic calcifications are again seen within the prostate. Other: No abdominal wall hernia or abnormality. No abdominopelvic ascites. No pneumoperitoneum. Musculoskeletal: There is grade 1 anterolisthesis of L4 on L5, similar to prior. Severe L5-S1 and moderate L4-5 degenerative disc changes. IMPRESSION: 1. Compared to 08/16/2018, interval distal partial colectomy. There is again sigmoid diverticulosis, now with inflammatory fat stranding and wall thickening indicating acute diverticulitis. There is a small rim enhancing abscess within the proximal sigmoid colon wall measuring up to 2.7 cm. No perforation is seen. No free fluid. 2. There is a probable 7 mm stone within the urinary bladder. A prior smaller urinary bladder stone is no longer visualized. 3. Status post appendectomy. 4. Prostatomegaly. 5. Additional chronic findings as above. Electronically Signed   By: Yvonne Kendall   On: 11/03/2021 12:12      Assessment/Plan Diverticulitis with intramural abscess The patient's chart, labs, and imaging have been reviewed.  He appears to have diverticulitis just proximal to his anastomosis from his LAR.  He was scheduled for a colonoscopy in November but got COVID and has to reschedule, but hasn't had it yet.  He has not had diverticulitis since his surgery.  He is currently very stable, AF, normal WBC, and pain has already started to improve since admission and initiation of abx therapy.  It is safe to  proceed with a CLD today and continue IV abx therapy.  He will likely improve with conservative management.  We will have him follow up with Dr. [name] in several weeks upon discharge for continuity of care and to ensure he is improving at that time.  We will continue to follow while here in the hospital.  Discussed patient with Dr. [place]  Would hold Xarelto while here in case he worsens and requires surgical intervention.   FEN - CLD/IVFs VTE - hold Xarelto, heparin on board ID - [email or web address]  A fib Myasthenia gravis COPD  Moderate Medical Decision Making  Henreitta Cea, PA-C [facility] 11/04/2021, 11:08 AM Please see Amion for pager number during day hours 7:00am-4:30pm or 7:00am -11:30am on weekends

## 2021-11-05 LAB — BASIC METABOLIC PANEL
Anion gap: 10 (ref 5–15)
BUN: 8 mg/dL (ref 8–23)
CO2: 28 mmol/L (ref 22–32)
Calcium: 8.3 mg/dL — ABNORMAL LOW (ref 8.9–10.3)
Chloride: 107 mmol/L (ref 98–111)
Creatinine, Ser: 0.75 mg/dL (ref 0.61–1.24)
GFR, Estimated: >60 mL/min (ref >60)
Glucose, Bld: 91 mg/dL (ref 70–99)
Potassium: 3.5 mmol/L (ref 3.5–5.1)
Sodium: 145 mmol/L (ref 135–145)

## 2021-11-05 LAB — CBC
HCT: 37.5 % — ABNORMAL LOW (ref 39.0–52.0)
Hemoglobin: 12 g/dL — ABNORMAL LOW (ref 13.0–17.0)
MCH: 29.8 pg (ref 26.0–34.0)
MCHC: 32 g/dL (ref 30.0–36.0)
MCV: 93.1 fL (ref 80.0–100.0)
Platelets: 189 10*3/uL (ref 150–400)
RBC: 4.03 MIL/uL — ABNORMAL LOW (ref 4.22–5.81)
RDW: 14.3 % (ref 11.5–15.5)
WBC: 6.8 10*3/uL (ref 4.0–10.5)
nRBC: 0 % (ref 0.0–0.2)

## 2021-11-05 LAB — MAGNESIUM: Magnesium: 2.2 mg/dL (ref 1.7–2.4)

## 2021-11-05 MED ORDER — AMOXICILLIN-POT CLAVULANATE 875-125 MG PO TABS: 1.0000 | ORAL_TABLET | Freq: Two times a day (BID) | ORAL | Status: DC

## 2021-11-05 MED ORDER — AMOXICILLIN-POT CLAVULANATE 875-125 MG PO TABS: 1.0000 | ORAL_TABLET | Freq: Two times a day (BID) | ORAL | 0 refills | Status: AC

## 2021-11-05 MED ORDER — POTASSIUM CHLORIDE CRYS ER 20 MEQ PO TBCR
40.0000 meq | EXTENDED_RELEASE_TABLET | Freq: Once | ORAL | Status: AC
  Administered 2021-11-05: 40 meq via ORAL
  Filled 2021-11-05: qty 2

## 2021-11-05 NOTE — Discharge Summary (Signed)
Physician Discharge Summary  [name]:[id number] DOB: 04/07/1941 DOA: 11/03/2021  PCP: [name], MD  Admit date: 11/03/2021 Discharge date: 11/05/2021  Admitted From: Home Disposition: Home  Recommendations for Outpatient Follow-up:  Follow up with PCP in 1-2 weeks Follow-up with general surgery, Dr. [name] in 2 weeks Continue antibiotics with Augmentin x2 weeks for recurrent diverticulitis with abscess  Home Health: No Equipment/Devices: None  Discharge Condition: Stable CODE STATUS: Full code Diet recommendation: Heart healthy diet  History of present illness:  [name] is a 81 year old male with past medical history significant for diverticulitis with abscess 2020 with partial left colectomy by Dr. [name], appendicitis s/p appendectomy, skin cancer, hyperlipidemia, myasthenia gravis, COPD, GERD, A. fib on Xarelto, pericarditis, PSVT s/p ablation 2012 who presented to [facility] ED on 1/6 with chief complaint of abdominal pain.  Onset for several days, progressing localized to bilateral lower quadrants.  No nausea/vomiting, reports regular bowel movements.  No fevers.  Chronic cough and shortness of breath are at baseline due to his underlying COPD.  Denies chest pain and no other specific complaints.   In the ED, temperature 97.7 F, HR 85, RR 16, BP 120/66, SPO2 92% on room air.  Sodium 139, potassium 3.3, chloride 110, CO2 26, glucose 107, BUN 15, creatinine 0.86.  Lipase 31, AST 14, ALT 17, total bilirubin 2.0.  WBC 7.9, hemoglobin 13.2, platelets 188.  Urinalysis unrevealing.  CT abdomen/pelvis with sigmoid diverticulosis now with inflammatory fat stranding and wall thickening likely secondary to acute diverticulitis with small rim-enhancing abscess within the proximal sigmoid colon wall measuring up to 2.7 cm without perforation and no free fluid.  General surgery was consulted.  Patient was started empiric antibiotics.  Hospital service consulted for further evaluation  and management of acute diverticulitis with abscess.  Hospital course:  Acute diverticulitis with abscess, recurrent Patient presenting with progressive lower quadrant abdominal pain.  History of diverticulitis/abscess in the past s/p partial left colectomy by Dr. [name] in 2020.  Patient is afebrile without leukocytosis.  Reports normal bowel movements without nausea/vomiting.  CT abdomen/pelvis with acute diverticulitis with small rim-enhancing abscess within the proximal sigmoid colon.  General surgery was consulted and followed during hospital course.  Patient was initially started on IV antibiotics with Zosyn with IV fluid hydration while kept NPO.  Diet was slowly advanced with toleration and with resolution of symptoms.  Patient will discharge on Augmentin 875-125 mg p.o. twice daily x14 days.  Outpatient follow-up with general surgery, Dr. [name].   Hypokalemia Repleted during hospitalization.   Myasthenia gravis Follows with neurology outpatient; Dr. [name].  Continue pyridostigmine 30 mg p.o. 3 times daily and Prednisone 10 mg p.o. daily.   COPD, stable without exacerbation Continue home Singulair   HLD: Continue home atorvastatin   Paroxysmal atrial fibrillation Essential hypertension Home regimen includes doxazosin 8 mg p.o. daily, metoprolol succinate 50 mg p.o. daily, Xarelto 20 mg p.o. daily.  BPH: Finasteride 5 mg p.o. daily  Discharge Diagnoses:  Principal Problem:   Diverticulitis Active Problems:   COPD GOLD II   Myasthenia ([place])   Hyperlipidemia   Hypokalemia    Discharge Instructions  Discharge Instructions     Call MD for:  difficulty breathing, headache or visual disturbances   Complete by: As directed    Call MD for:  extreme fatigue   Complete by: As directed    Call MD for:  persistant dizziness or light-headedness   Complete by: As directed    Call  MD for:  persistant nausea and vomiting   Complete by: As directed    Call MD for:  severe  uncontrolled pain   Complete by: As directed    Call MD for:  temperature >100.4   Complete by: As directed    Diet - low sodium heart healthy   Complete by: As directed    Increase activity slowly   Complete by: As directed       Allergies as of 11/05/2021       Reactions   Ambrosia Trifida (tall Ragweed) Allergy Skin Test Anaphylaxis   Bee Pollen Itching   Nasal congestion   Beta Adrenergic Blockers    Other reaction(s): MG   Ciprofloxacin    Other reaction(s): myasthenia gravis   Eliquis [apixaban]    Throat tightening   Erythromycin    Other reaction(s): MG   Pollen Extract Other (See Comments)   Nasal congestion        Medication List     TAKE these medications    albuterol 108 (90 Base) MCG/ACT inhaler Commonly known as: Ventolin HFA Inhale 1-2 puffs into the lungs every 6 (six) hours as needed for wheezing or shortness of breath.   amoxicillin-clavulanate 875-125 MG tablet Commonly known as: AUGMENTIN Take 1 tablet by mouth every 12 (twelve) hours for 14 days.   atorvastatin 40 MG tablet Commonly known as: LIPITOR Take 1 tablet (40 mg total) by mouth daily.   calcium carbonate 500 MG chewable tablet Commonly known as: TUMS - dosed in mg elemental calcium Chew 500 mg by mouth daily.   doxazosin 8 MG tablet Commonly known as: CARDURA Take 8 mg by mouth daily.   finasteride 5 MG tablet Commonly known as: PROSCAR 5 mg daily.   fluticasone 50 MCG/ACT nasal spray Commonly known as: FLONASE Place 2 sprays into both nostrils daily.   glucosamine-chondroitin 500-400 MG tablet Take 1 tablet by mouth daily.   ipratropium 0.03 % nasal spray Commonly known as: ATROVENT 1 spray daily.   metoprolol succinate 50 MG 24 hr tablet Commonly known as: TOPROL-XL TAKE 1 TABLET (50 MG TOTAL) BY MOUTH DAILY. PLEASE KEEP UPCOMING APPT FOR FUTURE REFILLS.   montelukast 10 MG tablet Commonly known as: SINGULAIR Take 10 mg by mouth daily.   multivitamin with  minerals Tabs tablet Take 1 tablet by mouth daily.   predniSONE 10 MG tablet Commonly known as: DELTASONE Take 1 tablet (10 mg total) by mouth daily with breakfast.   pyridostigmine 60 MG tablet Commonly known as: MESTINON Take 0.5 tablets (30 mg total) by mouth 3 (three) times daily. Take half tablet at 8am, 1pm, and 6pm   rivaroxaban 20 MG Tabs tablet Commonly known as: Xarelto Take 1 tablet (20 mg total) by mouth daily with supper.   sodium chloride 0.65 % Soln nasal spray Commonly known as: OCEAN Place 1 spray into both nostrils daily as needed for congestion.   VITAMIN B-12 PO Take 5,000 mcg by mouth daily.        Follow-up Information     [name], MD Follow up in 2 week(s).   Specialties: General Surgery, Colon and Rectal Surgery Contact information: [address] [place] [address] [phone]         [name], MD. Schedule an appointment as soon as possible for a visit in 1 week(s).   Specialty: Family Medicine Contact information: [place] [address] [phone]  Allergies  Allergen Reactions   Ambrosia Trifida (Tall Ragweed) Allergy Skin Test Anaphylaxis   Bee Pollen Itching    Nasal congestion   Beta Adrenergic Blockers     Other reaction(s): MG   Ciprofloxacin     Other reaction(s): myasthenia gravis   Eliquis [Apixaban]     Throat tightening   Erythromycin     Other reaction(s): MG   Pollen Extract Other (See Comments)    Nasal congestion    Consultations: General surgery   Procedures/Studies: CT ABDOMEN PELVIS W CONTRAST  Result Date: 11/03/2021 CLINICAL DATA:  Acute abdominal pain, non localized. Right greater than left lower quadrant pain. Onset 1 day ago. EXAM: CT ABDOMEN AND PELVIS WITH CONTRAST TECHNIQUE: Multidetector CT imaging of the abdomen and pelvis was performed using the standard protocol following bolus administration of intravenous contrast.  CONTRAST:  64mL OMNIPAQUE IOHEXOL 350 MG/ML SOLN COMPARISON:  CT abdomen and pelvis 09/03/2018, 08/16/2018 FINDINGS: Lower chest: There are right greater than left posterior lower lobe curvilinear densities and areas of interlobular septal thickening. This suggests chronic interstitial thickening/scarring that is not significantly changed from 08/16/2018. Hepatobiliary: Smooth liver contours. There are again numerous scattered low-attenuation well-circumscribed lesions throughout the liver. Those that are large enough to characterize demonstrate internal fluid density consistent with cysts. Others remain too small to further characterize. These appear not significantly changed from 08/16/2018. The gallbladder is unremarkable. No intrahepatic or extrahepatic biliary ductal dilatation. Pancreas: No mass or inflammatory fat stranding. No pancreatic ductal dilatation is seen. Spleen: Normal in size without focal abnormality. Adrenals/Urinary Tract: Unchanged 5 mm nodule within the left adrenal gland. Left parapelvic cyst measuring up to 8 cm, grossly unchanged. Right lower pole partially exophytic 5.0 cm cyst is similar to prior. There are additional smaller low-attenuation lesions within the bilateral kidneys again suggesting smaller cysts. Delayed phase imaging there is again prompt excretion of contrast into the proximal urinary collecting system. No renal stone is seen. No renal mass is seen. There again are calcific densities within the lumen of the urinary bladder layering dependently. The largest measures up to 7 mm within the posterior left aspect of the urinary bladder. A prior adjacent smaller stone is no longer visualized. There are a couple punctate calcific densities that may be within the bladder lumen versus the adjacent enlarged prostate, similar to prior. Stomach/Bowel: Surgical sutures seen within the distal sigmoid colon, likely from interval partial resection. There is again mild high-grade  diverticulosis within the remaining proximal sigmoid/descending colon with new moderate wall thickening and moderate surrounding inflammatory fat stranding. There is also a centrally low-density peripherally enhancing likely abscess within the anterior superior aspect of the proximal sigmoid colon in this region measuring up to 2.7 x 1.7 x 1.7 cm (transverse by AP by craniocaudal, axial 66/2 and coronal 70/5). The terminal ileum is unremarkable. Postsurgical changes of prior appendectomy. No dilated loops of bowel to indicate bowel obstruction. Vascular/Lymphatic: No abdominal aortic aneurysm. Reproductive: The prostate is again moderately enlarged, measuring up to 6.0 cm in transverse dimension and moderately impressing on the urinary bladder base. Dystrophic calcifications are again seen within the prostate. Other: No abdominal wall hernia or abnormality. No abdominopelvic ascites. No pneumoperitoneum. Musculoskeletal: There is grade 1 anterolisthesis of L4 on L5, similar to prior. Severe L5-S1 and moderate L4-5 degenerative disc changes. IMPRESSION: 1. Compared to 08/16/2018, interval distal partial colectomy. There is again sigmoid diverticulosis, now with inflammatory fat stranding and wall thickening indicating acute diverticulitis. There is a small rim enhancing  abscess within the proximal sigmoid colon wall measuring up to 2.7 cm. No perforation is seen. No free fluid. 2. There is a probable 7 mm stone within the urinary bladder. A prior smaller urinary bladder stone is no longer visualized. 3. Status post appendectomy. 4. Prostatomegaly. 5. Additional chronic findings as above. Electronically Signed   By: Yvonne Kendall   On: 11/03/2021 12:12     Subjective: Patient seen examined bedside, resting comfortably.  Sitting at edge of bed, spouse present.  Symptoms have resolved.  Diet advanced with toleration.  Okay for discharge home with outpatient follow-up per general surgery.  No other questions or  concerns at this time.  Denies headache, no fever/chills/night sweats, no nausea/vomiting/diarrhea, no chest pain, no palpitations, no shortness of breath, no abdominal pain, no weakness, no fatigue, no paresthesias.  No acute events overnight per nursing staff.  Discharge Exam: Vitals:   11/04/21 2236 11/05/21 0522  BP: 129/75 117/71  Pulse: 63 (!) 57  Resp: 16 18  Temp: 97.8 F (36.6 C) 97.8 F (36.6 C)  SpO2: 94% 94%   Vitals:   11/04/21 1245 11/04/21 1751 11/04/21 2236 11/05/21 0522  BP: 128/78 121/76 129/75 117/71  Pulse: (!) 59 67 63 (!) 57  Resp: 14 14 16 18   Temp: 97.8 F (36.6 C) 97.9 F (36.6 C) 97.8 F (36.6 C) 97.8 F (36.6 C)  TempSrc:   Oral Oral  SpO2: 93% 94% 94% 94%  Weight:      Height:        General: Pt is alert, awake, not in acute distress Cardiovascular: RRR, S1/S2 +, no rubs, no gallops Respiratory: CTA bilaterally, no wheezing, no rhonchi, on room air Abdominal: Soft, NT, ND, bowel sounds + Extremities: no edema, no cyanosis    The results of significant diagnostics from this hospitalization (including imaging, microbiology, ancillary and laboratory) are listed below for reference.     Microbiology: Recent Results (from the past 240 hour(s))  Resp Panel by RT-PCR (Flu A&B, Covid) Nasopharyngeal Swab     Status: None   Collection Time: 11/04/21 11:03 AM   Specimen: Nasopharyngeal Swab; Nasopharyngeal(NP) swabs in vial transport medium  Result Value Ref Range Status   SARS Coronavirus 2 by RT PCR NEGATIVE NEGATIVE Final    Comment: (NOTE) SARS-CoV-2 target nucleic acids are NOT DETECTED.  The SARS-CoV-2 RNA is generally detectable in upper respiratory specimens during the acute phase of infection. The lowest concentration of SARS-CoV-2 viral copies this assay can detect is 138 copies/mL. A negative result does not preclude SARS-Cov-2 infection and should not be used as the sole basis for treatment or other patient management decisions. A  negative result may occur with  improper specimen collection/handling, submission of specimen other than nasopharyngeal swab, presence of viral mutation(s) within the areas targeted by this assay, and inadequate number of viral copies(<138 copies/mL). A negative result must be combined with clinical observations, patient history, and epidemiological information. The expected result is Negative.  Fact Sheet for Patients:  EntrepreneurPulse.com.au  Fact Sheet for Healthcare Providers:  IncredibleEmployment.be  This test is no t yet approved or cleared by the [place] FDA and  has been authorized for detection and/or diagnosis of SARS-CoV-2 by FDA under an Emergency Use Authorization (EUA). This EUA will remain  in effect (meaning this test can be used) for the duration of the COVID-19 declaration under Section 564(b)(1) of the Act, 21 U.S.C.section 360bbb-3(b)(1), unless the authorization is terminated  or revoked sooner.  Influenza A by PCR NEGATIVE NEGATIVE Final   Influenza B by PCR NEGATIVE NEGATIVE Final    Comment: (NOTE) The Xpert Xpress SARS-CoV-2/FLU/RSV plus assay is intended as an aid in the diagnosis of influenza from Nasopharyngeal swab specimens and should not be used as a sole basis for treatment. Nasal washings and aspirates are unacceptable for Xpert Xpress SARS-CoV-2/FLU/RSV testing.  Fact Sheet for Patients: EntrepreneurPulse.com.au  Fact Sheet for Healthcare Providers: IncredibleEmployment.be  This test is not yet approved or cleared by the [place] FDA and has been authorized for detection and/or diagnosis of SARS-CoV-2 by FDA under an Emergency Use Authorization (EUA). This EUA will remain in effect (meaning this test can be used) for the duration of the COVID-19 declaration under Section 564(b)(1) of the Act, 21 U.S.C. section 360bbb-3(b)(1), unless the authorization  is terminated or revoked.  Performed at [facility], [place] [address] [place], [place] [address]      Labs: BNP (last 3 results) No results for input(s): BNP in the last 8760 hours. Basic Metabolic Panel: Recent Labs  Lab 11/03/21 1023 11/04/21 0509 11/05/21 0501  NA 139 141 145  K 3.3* 3.4* 3.5  CL 110 105 107  CO2 26 27 28   GLUCOSE 107* 97 91  BUN 15 12 8   CREATININE 0.86 0.79 0.75  CALCIUM 8.0* 8.2* 8.3*  MG  --  2.2 2.2   Liver Function Tests: Recent Labs  Lab 11/03/21 1023 11/04/21 0509  AST 14* 13*  ALT 17 15  ALKPHOS 45 41  BILITOT 2.0* 1.8*  PROT 6.1* 5.7*  ALBUMIN 3.3* 3.1*   Recent Labs  Lab 11/03/21 1023  LIPASE 31   No results for input(s): AMMONIA in the last 168 hours. CBC: Recent Labs  Lab 11/03/21 1023 11/04/21 0509 11/05/21 0501  WBC 7.9 7.7 6.8  NEUTROABS 6.0  --   --   HGB 13.2 12.3* 12.0*  HCT 40.6 37.7* 37.5*  MCV 92.3 92.6 93.1  PLT 188 198 189   Cardiac Enzymes: No results for input(s): CKTOTAL, CKMB, CKMBINDEX, TROPONINI in the last 168 hours. BNP: Invalid input(s): POCBNP CBG: No results for input(s): GLUCAP in the last 168 hours. D-Dimer No results for input(s): DDIMER in the last 72 hours. Hgb A1c No results for input(s): HGBA1C in the last 72 hours. Lipid Profile No results for input(s): CHOL, HDL, LDLCALC, TRIG, CHOLHDL, LDLDIRECT in the last 72 hours. Thyroid function studies No results for input(s): TSH, T4TOTAL, T3FREE, THYROIDAB in the last 72 hours.  Invalid input(s): FREET3 Anemia work up No results for input(s): VITAMINB12, FOLATE, FERRITIN, TIBC, IRON, RETICCTPCT in the last 72 hours. Urinalysis    Component Value Date/Time   COLORURINE YELLOW 11/03/2021 1047   APPEARANCEUR CLEAR 11/03/2021 1047   LABSPEC 1.018 11/03/2021 1047   PHURINE 7.0 11/03/2021 [place] 11/03/2021 [place] 11/03/2021 [place] 11/03/2021 [place] 11/03/2021 1047   PROTEINUR NEGATIVE 11/03/2021 1047   UROBILINOGEN 0.2 09/05/2014 1218   NITRITE NEGATIVE 11/03/2021 1047   LEUKOCYTESUR NEGATIVE 11/03/2021 1047   Sepsis Labs Invalid input(s): PROCALCITONIN,  WBC,  LACTICIDVEN Microbiology Recent Results (from the past 240 hour(s))  Resp Panel by RT-PCR (Flu A&B, Covid) Nasopharyngeal Swab     Status: None   Collection Time: 11/04/21 11:03 AM   Specimen: Nasopharyngeal Swab; Nasopharyngeal(NP) swabs in vial transport medium  Result Value Ref Range Status   SARS Coronavirus 2 by RT  PCR NEGATIVE NEGATIVE Final    Comment: (NOTE) SARS-CoV-2 target nucleic acids are NOT DETECTED.  The SARS-CoV-2 RNA is generally detectable in upper respiratory specimens during the acute phase of infection. The lowest concentration of SARS-CoV-2 viral copies this assay can detect is 138 copies/mL. A negative result does not preclude SARS-Cov-2 infection and should not be used as the sole basis for treatment or other patient management decisions. A negative result may occur with  improper specimen collection/handling, submission of specimen other than nasopharyngeal swab, presence of viral mutation(s) within the areas targeted by this assay, and inadequate number of viral copies(<138 copies/mL). A negative result must be combined with clinical observations, patient history, and epidemiological information. The expected result is Negative.  Fact Sheet for Patients:  EntrepreneurPulse.com.au  Fact Sheet for Healthcare Providers:  IncredibleEmployment.be  This test is no t yet approved or cleared by the [place] FDA and  has been authorized for detection and/or diagnosis of SARS-CoV-2 by FDA under an Emergency Use Authorization (EUA). This EUA will remain  in effect (meaning this test can be used) for the duration of the COVID-19 declaration under Section 564(b)(1) of the Act, 21 U.S.C.section  360bbb-3(b)(1), unless the authorization is terminated  or revoked sooner.       Influenza A by PCR NEGATIVE NEGATIVE Final   Influenza B by PCR NEGATIVE NEGATIVE Final    Comment: (NOTE) The Xpert Xpress SARS-CoV-2/FLU/RSV plus assay is intended as an aid in the diagnosis of influenza from Nasopharyngeal swab specimens and should not be used as a sole basis for treatment. Nasal washings and aspirates are unacceptable for Xpert Xpress SARS-CoV-2/FLU/RSV testing.  Fact Sheet for Patients: EntrepreneurPulse.com.au  Fact Sheet for Healthcare Providers: IncredibleEmployment.be  This test is not yet approved or cleared by the [place] FDA and has been authorized for detection and/or diagnosis of SARS-CoV-2 by FDA under an Emergency Use Authorization (EUA). This EUA will remain in effect (meaning this test can be used) for the duration of the COVID-19 declaration under Section 564(b)(1) of the Act, 21 U.S.C. section 360bbb-3(b)(1), unless the authorization is terminated or revoked.  Performed at [facility], [place] [address] [place], [place] [address]      Time coordinating discharge: Over 30 minutes  SIGNED:   Zaylen Susman J [place] DO  [facility] 11/05/2021, 2:09 PM

## 2021-11-05 NOTE — Progress Notes (Signed)
Subjective/Chief Complaint: No abdominal pain Had 2 BM yesterday Hungry   Objective: Vital signs in last 24 hours: Temp:  [97.8 F (36.6 C)-97.9 F (36.6 C)] 97.8 F (36.6 C) (01/08 0522) Pulse Rate:  [57-67] 57 (01/08 0522) Resp:  [14-18] 18 (01/08 0522) BP: (117-129)/(71-78) 117/71 (01/08 0522) SpO2:  [93 %-94 %] 94 % (01/08 0522) Last BM Date: 11/04/21  Intake/Output from previous day: 01/07 0701 - 01/08 0700 In: 2212.2 [P.O.:720; I.V.:1396.7; IV Piggyback:95.5] Out: 1000 [Urine:1000] Intake/Output this shift: No intake/output data recorded.  GI: soft, non-tender; bowel sounds normal; no masses,  no organomegaly  Lab Results:  Recent Labs    11/04/21 0509 11/05/21 0501  WBC 7.7 6.8  HGB 12.3* 12.0*  HCT 37.7* 37.5*  PLT 198 189   BMET Recent Labs    11/04/21 0509 11/05/21 0501  NA 141 145  K 3.4* 3.5  CL 105 107  CO2 27 28  GLUCOSE 97 91  BUN 12 8  CREATININE 0.79 0.75  CALCIUM 8.2* 8.3*   PT/INR No results for input(s): LABPROT, INR in the last 72 hours. ABG No results for input(s): PHART, HCO3 in the last 72 hours.  Invalid input(s): PCO2, PO2  Studies/Results: CT ABDOMEN PELVIS W CONTRAST  Result Date: 11/03/2021 CLINICAL DATA:  Acute abdominal pain, non localized. Right greater than left lower quadrant pain. Onset 1 day ago. EXAM: CT ABDOMEN AND PELVIS WITH CONTRAST TECHNIQUE: Multidetector CT imaging of the abdomen and pelvis was performed using the standard protocol following bolus administration of intravenous contrast. CONTRAST:  38mL OMNIPAQUE IOHEXOL 350 MG/ML SOLN COMPARISON:  CT abdomen and pelvis 09/03/2018, 08/16/2018 FINDINGS: Lower chest: There are right greater than left posterior lower lobe curvilinear densities and areas of interlobular septal thickening. This suggests chronic interstitial thickening/scarring that is not significantly changed from 08/16/2018. Hepatobiliary: Smooth liver contours. There are again numerous  scattered low-attenuation well-circumscribed lesions throughout the liver. Those that are large enough to characterize demonstrate internal fluid density consistent with cysts. Others remain too small to further characterize. These appear not significantly changed from 08/16/2018. The gallbladder is unremarkable. No intrahepatic or extrahepatic biliary ductal dilatation. Pancreas: No mass or inflammatory fat stranding. No pancreatic ductal dilatation is seen. Spleen: Normal in size without focal abnormality. Adrenals/Urinary Tract: Unchanged 5 mm nodule within the left adrenal gland. Left parapelvic cyst measuring up to 8 cm, grossly unchanged. Right lower pole partially exophytic 5.0 cm cyst is similar to prior. There are additional smaller low-attenuation lesions within the bilateral kidneys again suggesting smaller cysts. Delayed phase imaging there is again prompt excretion of contrast into the proximal urinary collecting system. No renal stone is seen. No renal mass is seen. There again are calcific densities within the lumen of the urinary bladder layering dependently. The largest measures up to 7 mm within the posterior left aspect of the urinary bladder. A prior adjacent smaller stone is no longer visualized. There are a couple punctate calcific densities that may be within the bladder lumen versus the adjacent enlarged prostate, similar to prior. Stomach/Bowel: Surgical sutures seen within the distal sigmoid colon, likely from interval partial resection. There is again mild high-grade diverticulosis within the remaining proximal sigmoid/descending colon with new moderate wall thickening and moderate surrounding inflammatory fat stranding. There is also a centrally low-density peripherally enhancing likely abscess within the anterior superior aspect of the proximal sigmoid colon in this region measuring up to 2.7 x 1.7 x 1.7 cm (transverse by AP by craniocaudal, axial 66/2 and  coronal 70/5). The terminal  ileum is unremarkable. Postsurgical changes of prior appendectomy. No dilated loops of bowel to indicate bowel obstruction. Vascular/Lymphatic: No abdominal aortic aneurysm. Reproductive: The prostate is again moderately enlarged, measuring up to 6.0 cm in transverse dimension and moderately impressing on the urinary bladder base. Dystrophic calcifications are again seen within the prostate. Other: No abdominal wall hernia or abnormality. No abdominopelvic ascites. No pneumoperitoneum. Musculoskeletal: There is grade 1 anterolisthesis of L4 on L5, similar to prior. Severe L5-S1 and moderate L4-5 degenerative disc changes. IMPRESSION: 1. Compared to 08/16/2018, interval distal partial colectomy. There is again sigmoid diverticulosis, now with inflammatory fat stranding and wall thickening indicating acute diverticulitis. There is a small rim enhancing abscess within the proximal sigmoid colon wall measuring up to 2.7 cm. No perforation is seen. No free fluid. 2. There is a probable 7 mm stone within the urinary bladder. A prior smaller urinary bladder stone is no longer visualized. 3. Status post appendectomy. 4. Prostatomegaly. 5. Additional chronic findings as above. Electronically Signed   By: Yvonne Kendall   On: 11/03/2021 12:12    Anti-infectives: Anti-infectives (From admission, onward)    Start     Dose/Rate Route Frequency Ordered Stop   11/05/21 1145  amoxicillin-clavulanate (AUGMENTIN) 875-125 MG per tablet 1 tablet        1 tablet Oral Every 12 hours 11/05/21 1059     11/04/21 0200  piperacillin-tazobactam (ZOSYN) IVPB 3.375 g  Status:  Discontinued        3.375 g 12.5 mL/hr over 240 Minutes Intravenous Every 8 hours 11/03/21 2018 11/05/21 1059   11/03/21 1300  piperacillin-tazobactam (ZOSYN) IVPB 3.375 g        3.375 g 100 mL/hr over 30 Minutes Intravenous  Once 11/03/21 1259 11/03/21 1831       Assessment/Plan: Diverticulitis with intramural abscess He appears to have diverticulitis  just proximal to his anastomosis from his LAR.  He was scheduled for a colonoscopy in November but got COVID and has to reschedule, but hasn't had it yet.  He has not had diverticulitis since his surgery.    Transition to PO Augmentin Soft diet    FEN - soft/IVFs VTE - restart Xarelto, heparin on board ID - Zosyn D/C - PO Augmentin   A fib Myasthenia gravis COPD   If he tolerates a diet for lunch, may discharge on 2 weeks of Augmentin with close follow-up with Dr. [name].  Instructions in chart.  D/W Dr. [place]    LOS: 2 days    Imogene Burn [facility] 11/05/2021

## 2021-11-05 NOTE — Progress Notes (Signed)
PROGRESS NOTE    [name]  VVO:[id number] DOB: 11/19/1940 DOA: 11/03/2021 PCP: [name], MD    Brief Narrative:  [name] is a 81 year old male with past medical history significant for diverticulitis with abscess 2020 with partial left colectomy by Dr. [name], appendicitis s/p appendectomy, skin cancer, hyperlipidemia, myasthenia gravis, COPD, GERD, A. fib on Xarelto, pericarditis, PSVT s/p ablation 2012 who presented to [facility] ED on 1/6 with chief complaint of abdominal pain.  Onset for several days, progressing localized to bilateral lower quadrants.  No nausea/vomiting, reports regular bowel movements.  No fevers.  Chronic cough and shortness of breath are at baseline due to his underlying COPD.  Denies chest pain and no other specific complaints.  In the ED, temperature 97.7 F, HR 85, RR 16, BP 120/66, SPO2 92% on room air.  Sodium 139, potassium 3.3, chloride 110, CO2 26, glucose 107, BUN 15, creatinine 0.86.  Lipase 31, AST 14, ALT 17, total bilirubin 2.0.  WBC 7.9, hemoglobin 13.2, platelets 188.  Urinalysis unrevealing.  CT abdomen/pelvis with sigmoid diverticulosis now with inflammatory fat stranding and wall thickening likely secondary to acute diverticulitis with small rim-enhancing abscess within the proximal sigmoid colon wall measuring up to 2.7 cm without perforation and no free fluid.  General surgery was consulted.  Patient was started empiric antibiotics.  Hospital service consulted for further evaluation and management of acute diverticulitis with abscess.   Assessment & Plan:   Principal Problem:   Diverticulitis Active Problems:   COPD GOLD II   Myasthenia ([place])   Hyperlipidemia   Hypokalemia   Acute diverticulitis with abscess, recurrent Patient presenting with progressive lower quadrant abdominal pain.  History of diverticulitis/abscess in the past s/p partial left colectomy by Dr. [name] in 2020.  Patient is afebrile without leukocytosis.  Reports  normal bowel movements without nausea/vomiting.  CT abdomen/pelvis with acute diverticulitis with small rim-enhancing abscess within the proximal sigmoid colon. --General surgery following, appreciate assistance --Clear liquid diet --NS at 75 mL/h --Zosyn --CBC daily --Further per general surgery  Hypokalemia Potassium 3.5 this morning, magnesium 2.2.  Will replete potassium. --Repeat electrolytes in the a.m.  Myasthenia gravis Follows with neurology outpatient; Dr. [name]. --Pyridostigmine 30 mg p.o. 3 times daily --Prednisone 10 mg p.o. daily  COPD, stable without exacerbation --Singulair  HLD: --Holding home atorvastatin  Paroxysmal atrial fibrillation Essential hypertension Home regimen includes doxazosin 8 mg p.o. daily, metoprolol succinate 50 mg p.o. daily, Xarelto 20 mg p.o. daily. --BP 117/71 this morning with HR 57. --Holding home Xarelto, doxazosin and metoprolol succinate for now --Monitor BP closely --Monitor on telemetry  BPH:  --Finasteride 5 mg p.o. daily   DVT prophylaxis: heparin injection 5,000 Units Start: 11/03/21 2200   Code Status: Full Code Family Communication: Updated spouse present at bedside this morning  Disposition Plan:  Level of care: Med-Surg Status is: Inpatient  Remains inpatient appropriate because: Continues to require IV antibiotics, remains on clear liquid diet and IV fluids, hopeful for further advancement of diet today.  Anticipate discharge in 1-2 days; pending further general surgery recommendations     Consultants:  General surgery  Procedures:  None  Antimicrobials:  Zosyn 1/6>>    Subjective: Patient seen examined at bedside, resting comfortably.  Sitting at edge of bed.  Spouse present.  Continues to report resolution of abdominal pain, positive flatus and bowel movements.  Hopeful to further advance his diet today; remains on clear liquid diet, IV fluids and IV Zosyn. No other questions  or concerns from patient  or spouse this morning.  Denies headache, no dizziness, no chest pain, palpitations, no shortness of breath greater than his typical baseline, no nausea/vomiting/diarrhea, no weakness, no fatigue, no paresthesias.  No acute events overnight per nursing staff.  Discussed with general surgery, Dr. [name] this morning.  Objective: Vitals:   11/04/21 1245 11/04/21 1751 11/04/21 2236 11/05/21 0522  BP: 128/78 121/76 129/75 117/71  Pulse: (!) 59 67 63 (!) 57  Resp: 14 14 16 18   Temp: 97.8 F (36.6 C) 97.9 F (36.6 C) 97.8 F (36.6 C) 97.8 F (36.6 C)  TempSrc:   Oral Oral  SpO2: 93% 94% 94% 94%  Weight:      Height:        Intake/Output Summary (Last 24 hours) at 11/05/2021 1012 Last data filed at 11/05/2021 0600 Gross per 24 hour  Intake 2212.21 ml  Output 1000 ml  Net 1212.21 ml   Filed Weights   11/03/21 1009  Weight: 88.5 kg    Examination:  General exam: Appears calm and comfortable  Respiratory system: Clear to auscultation. Respiratory effort normal.  On room air Cardiovascular system: S1 & S2 heard, RRR. No JVD, murmurs, rubs, gallops or clicks. No pedal edema. Gastrointestinal system: Abdomen is nondistended, soft and nontender. No organomegaly or masses felt. Normal bowel sounds heard. Central nervous system: Alert and oriented. No focal neurological deficits. Extremities: Symmetric 5 x 5 power. Skin: No rashes, lesions or ulcers Psychiatry: Judgement and insight appear normal. Mood & affect appropriate.     Data Reviewed: I have personally reviewed following labs and imaging studies  CBC: Recent Labs  Lab 11/03/21 1023 11/04/21 0509 11/05/21 0501  WBC 7.9 7.7 6.8  NEUTROABS 6.0  --   --   HGB 13.2 12.3* 12.0*  HCT 40.6 37.7* 37.5*  MCV 92.3 92.6 93.1  PLT 188 198 789   Basic Metabolic Panel: Recent Labs  Lab 11/03/21 1023 11/04/21 0509 11/05/21 0501  NA 139 141 145  K 3.3* 3.4* 3.5  CL 110 105 107  CO2 26 27 28   GLUCOSE 107* 97 91  BUN 15 12 8    CREATININE 0.86 0.79 0.75  CALCIUM 8.0* 8.2* 8.3*  MG  --  2.2 2.2   GFR: Estimated Creatinine Clearance: 78.1 mL/min (by C-G formula based on SCr of 0.75 mg/dL). Liver Function Tests: Recent Labs  Lab 11/03/21 1023 11/04/21 0509  AST 14* 13*  ALT 17 15  ALKPHOS 45 41  BILITOT 2.0* 1.8*  PROT 6.1* 5.7*  ALBUMIN 3.3* 3.1*   Recent Labs  Lab 11/03/21 1023  LIPASE 31   No results for input(s): AMMONIA in the last 168 hours. Coagulation Profile: No results for input(s): INR, PROTIME in the last 168 hours. Cardiac Enzymes: No results for input(s): CKTOTAL, CKMB, CKMBINDEX, TROPONINI in the last 168 hours. BNP (last 3 results) No results for input(s): PROBNP in the last 8760 hours. HbA1C: No results for input(s): HGBA1C in the last 72 hours. CBG: No results for input(s): GLUCAP in the last 168 hours. Lipid Profile: No results for input(s): CHOL, HDL, LDLCALC, TRIG, CHOLHDL, LDLDIRECT in the last 72 hours. Thyroid Function Tests: No results for input(s): TSH, T4TOTAL, FREET4, T3FREE, THYROIDAB in the last 72 hours. Anemia Panel: No results for input(s): VITAMINB12, FOLATE, FERRITIN, TIBC, IRON, RETICCTPCT in the last 72 hours. Sepsis Labs: No results for input(s): PROCALCITON, LATICACIDVEN in the last 168 hours.  Recent Results (from the past 240 hour(s))  Resp Panel  by RT-PCR (Flu A&B, Covid) Nasopharyngeal Swab     Status: None   Collection Time: 11/04/21 11:03 AM   Specimen: Nasopharyngeal Swab; Nasopharyngeal(NP) swabs in vial transport medium  Result Value Ref Range Status   SARS Coronavirus 2 by RT PCR NEGATIVE NEGATIVE Final    Comment: (NOTE) SARS-CoV-2 target nucleic acids are NOT DETECTED.  The SARS-CoV-2 RNA is generally detectable in upper respiratory specimens during the acute phase of infection. The lowest concentration of SARS-CoV-2 viral copies this assay can detect is 138 copies/mL. A negative result does not preclude SARS-Cov-2 infection and should  not be used as the sole basis for treatment or other patient management decisions. A negative result may occur with  improper specimen collection/handling, submission of specimen other than nasopharyngeal swab, presence of viral mutation(s) within the areas targeted by this assay, and inadequate number of viral copies(<138 copies/mL). A negative result must be combined with clinical observations, patient history, and epidemiological information. The expected result is Negative.  Fact Sheet for Patients:  EntrepreneurPulse.com.au  Fact Sheet for Healthcare Providers:  IncredibleEmployment.be  This test is no t yet approved or cleared by the [place] FDA and  has been authorized for detection and/or diagnosis of SARS-CoV-2 by FDA under an Emergency Use Authorization (EUA). This EUA will remain  in effect (meaning this test can be used) for the duration of the COVID-19 declaration under Section 564(b)(1) of the Act, 21 U.S.C.section 360bbb-3(b)(1), unless the authorization is terminated  or revoked sooner.       Influenza A by PCR NEGATIVE NEGATIVE Final   Influenza B by PCR NEGATIVE NEGATIVE Final    Comment: (NOTE) The Xpert Xpress SARS-CoV-2/FLU/RSV plus assay is intended as an aid in the diagnosis of influenza from Nasopharyngeal swab specimens and should not be used as a sole basis for treatment. Nasal washings and aspirates are unacceptable for Xpert Xpress SARS-CoV-2/FLU/RSV testing.  Fact Sheet for Patients: EntrepreneurPulse.com.au  Fact Sheet for Healthcare Providers: IncredibleEmployment.be  This test is not yet approved or cleared by the [place] FDA and has been authorized for detection and/or diagnosis of SARS-CoV-2 by FDA under an Emergency Use Authorization (EUA). This EUA will remain in effect (meaning this test can be used) for the duration of the COVID-19 declaration under  Section 564(b)(1) of the Act, 21 U.S.C. section 360bbb-3(b)(1), unless the authorization is terminated or revoked.  Performed at [facility], [place] [address], [place], [place] [address]          Radiology Studies: CT ABDOMEN PELVIS W CONTRAST  Result Date: 11/03/2021 CLINICAL DATA:  Acute abdominal pain, non localized. Right greater than left lower quadrant pain. Onset 1 day ago. EXAM: CT ABDOMEN AND PELVIS WITH CONTRAST TECHNIQUE: Multidetector CT imaging of the abdomen and pelvis was performed using the standard protocol following bolus administration of intravenous contrast. CONTRAST:  26mL OMNIPAQUE IOHEXOL 350 MG/ML SOLN COMPARISON:  CT abdomen and pelvis 09/03/2018, 08/16/2018 FINDINGS: Lower chest: There are right greater than left posterior lower lobe curvilinear densities and areas of interlobular septal thickening. This suggests chronic interstitial thickening/scarring that is not significantly changed from 08/16/2018. Hepatobiliary: Smooth liver contours. There are again numerous scattered low-attenuation well-circumscribed lesions throughout the liver. Those that are large enough to characterize demonstrate internal fluid density consistent with cysts. Others remain too small to further characterize. These appear not significantly changed from 08/16/2018. The gallbladder is unremarkable. No intrahepatic or extrahepatic biliary ductal dilatation. Pancreas: No mass or inflammatory fat stranding. No pancreatic  ductal dilatation is seen. Spleen: Normal in size without focal abnormality. Adrenals/Urinary Tract: Unchanged 5 mm nodule within the left adrenal gland. Left parapelvic cyst measuring up to 8 cm, grossly unchanged. Right lower pole partially exophytic 5.0 cm cyst is similar to prior. There are additional smaller low-attenuation lesions within the bilateral kidneys again suggesting smaller cysts. Delayed phase imaging there is again prompt excretion of contrast into  the proximal urinary collecting system. No renal stone is seen. No renal mass is seen. There again are calcific densities within the lumen of the urinary bladder layering dependently. The largest measures up to 7 mm within the posterior left aspect of the urinary bladder. A prior adjacent smaller stone is no longer visualized. There are a couple punctate calcific densities that may be within the bladder lumen versus the adjacent enlarged prostate, similar to prior. Stomach/Bowel: Surgical sutures seen within the distal sigmoid colon, likely from interval partial resection. There is again mild high-grade diverticulosis within the remaining proximal sigmoid/descending colon with new moderate wall thickening and moderate surrounding inflammatory fat stranding. There is also a centrally low-density peripherally enhancing likely abscess within the anterior superior aspect of the proximal sigmoid colon in this region measuring up to 2.7 x 1.7 x 1.7 cm (transverse by AP by craniocaudal, axial 66/2 and coronal 70/5). The terminal ileum is unremarkable. Postsurgical changes of prior appendectomy. No dilated loops of bowel to indicate bowel obstruction. Vascular/Lymphatic: No abdominal aortic aneurysm. Reproductive: The prostate is again moderately enlarged, measuring up to 6.0 cm in transverse dimension and moderately impressing on the urinary bladder base. Dystrophic calcifications are again seen within the prostate. Other: No abdominal wall hernia or abnormality. No abdominopelvic ascites. No pneumoperitoneum. Musculoskeletal: There is grade 1 anterolisthesis of L4 on L5, similar to prior. Severe L5-S1 and moderate L4-5 degenerative disc changes. IMPRESSION: 1. Compared to 08/16/2018, interval distal partial colectomy. There is again sigmoid diverticulosis, now with inflammatory fat stranding and wall thickening indicating acute diverticulitis. There is a small rim enhancing abscess within the proximal sigmoid colon wall  measuring up to 2.7 cm. No perforation is seen. No free fluid. 2. There is a probable 7 mm stone within the urinary bladder. A prior smaller urinary bladder stone is no longer visualized. 3. Status post appendectomy. 4. Prostatomegaly. 5. Additional chronic findings as above. Electronically Signed   By: Yvonne Kendall   On: 11/03/2021 12:12        Scheduled Meds:  finasteride  5 mg Oral Daily   fluticasone  2 spray Each Nare Daily   heparin  5,000 Units Subcutaneous Q8H   montelukast  10 mg Oral Daily   predniSONE  10 mg Oral Q breakfast   pyridostigmine  30 mg Oral TID   Continuous Infusions:  sodium chloride 75 mL/hr at 11/05/21 0009   piperacillin-tazobactam (ZOSYN)  IV 3.375 g (11/05/21 0953)     LOS: 2 days    Time spent: 38 minutes spent on chart review, discussion with nursing staff, consultants, updating family and interview/physical exam; more than 50% of that time was spent in counseling and/or coordination of care.    Richar Dunklee J [place] DO [facility] Available via Epic secure chat 7am-7pm After these hours, please refer to coverage provider listed on [email or web address] 11/05/2021, 10:12 AM

## 2021-11-05 NOTE — Progress Notes (Signed)
Discharge instructions given to patient and all questions were answered.  

## 2021-11-06 ENCOUNTER — Ambulatory Visit: Payer: Medicare Other | Admitting: Neurology

## 2021-11-07 DIAGNOSIS — E876 Hypokalemia: Secondary | ICD-10-CM | POA: Diagnosis not present

## 2021-11-07 DIAGNOSIS — G7000 Myasthenia gravis without (acute) exacerbation: Secondary | ICD-10-CM | POA: Diagnosis not present

## 2021-11-07 DIAGNOSIS — K5792 Diverticulitis of intestine, part unspecified, without perforation or abscess without bleeding: Secondary | ICD-10-CM | POA: Diagnosis not present

## 2021-11-07 DIAGNOSIS — J439 Emphysema, unspecified: Secondary | ICD-10-CM | POA: Diagnosis not present

## 2021-11-07 DIAGNOSIS — E782 Mixed hyperlipidemia: Secondary | ICD-10-CM | POA: Diagnosis not present

## 2021-11-07 DIAGNOSIS — Z79899 Other long term (current) drug therapy: Secondary | ICD-10-CM | POA: Diagnosis not present

## 2021-11-07 DIAGNOSIS — N210 Calculus in bladder: Secondary | ICD-10-CM | POA: Diagnosis not present

## 2021-11-07 DIAGNOSIS — R7303 Prediabetes: Secondary | ICD-10-CM | POA: Diagnosis not present

## 2021-11-07 DIAGNOSIS — R600 Localized edema: Secondary | ICD-10-CM | POA: Diagnosis not present

## 2021-11-20 DIAGNOSIS — Z7952 Long term (current) use of systemic steroids: Secondary | ICD-10-CM | POA: Diagnosis not present

## 2021-11-20 DIAGNOSIS — D84821 Immunodeficiency due to drugs: Secondary | ICD-10-CM | POA: Diagnosis not present

## 2021-11-20 DIAGNOSIS — T380X5A Adverse effect of glucocorticoids and synthetic analogues, initial encounter: Secondary | ICD-10-CM | POA: Diagnosis not present

## 2021-11-20 DIAGNOSIS — K5732 Diverticulitis of large intestine without perforation or abscess without bleeding: Secondary | ICD-10-CM | POA: Diagnosis not present

## 2021-11-20 DIAGNOSIS — Z8719 Personal history of other diseases of the digestive system: Secondary | ICD-10-CM | POA: Diagnosis not present

## 2021-12-07 ENCOUNTER — Encounter: Payer: Self-pay | Admitting: Cardiology

## 2021-12-08 ENCOUNTER — Telehealth (INDEPENDENT_AMBULATORY_CARE_PROVIDER_SITE_OTHER): Payer: Medicare Other

## 2021-12-08 DIAGNOSIS — Z5181 Encounter for therapeutic drug level monitoring: Secondary | ICD-10-CM

## 2021-12-08 DIAGNOSIS — I4891 Unspecified atrial fibrillation: Secondary | ICD-10-CM

## 2021-12-08 MED ORDER — WARFARIN SODIUM 5 MG PO TABS: 5.0000 mg | ORAL_TABLET | Freq: Every day | ORAL | 0 refills | Status: DC

## 2021-12-08 NOTE — Telephone Encounter (Signed)
Called pt and scheduled appt for New Coumadin appt at 10:30am on 12/21/21. Instructed pt to start taking Warfarin on 12/16/21 and continue taking Xarelto on 12/17/21 and 12/18/21 and then STOP taking Xarelto and continue taking Warfarin until appt on 12/21/21. Pt verbalized understanding. Confirmed appropriate dose with Marcelle Overlie, Pharmacist that pt should start on Warfarin 5mg  daily. Appropriate dose and refill sent to requested pharmacy.

## 2021-12-21 ENCOUNTER — Other Ambulatory Visit: Payer: Self-pay

## 2021-12-21 ENCOUNTER — Ambulatory Visit (INDEPENDENT_AMBULATORY_CARE_PROVIDER_SITE_OTHER): Payer: Medicare Other | Admitting: *Deleted

## 2021-12-21 ENCOUNTER — Other Ambulatory Visit: Payer: Medicare Other

## 2021-12-21 DIAGNOSIS — E785 Hyperlipidemia, unspecified: Secondary | ICD-10-CM

## 2021-12-21 DIAGNOSIS — I4891 Unspecified atrial fibrillation: Secondary | ICD-10-CM

## 2021-12-21 DIAGNOSIS — Z79899 Other long term (current) drug therapy: Secondary | ICD-10-CM

## 2021-12-21 DIAGNOSIS — Z7901 Long term (current) use of anticoagulants: Secondary | ICD-10-CM | POA: Diagnosis not present

## 2021-12-21 LAB — LIPID PANEL
Chol/HDL Ratio: 2.8 ratio (ref 0.0–5.0)
Cholesterol, Total: 133 mg/dL (ref 100–199)
HDL: 47 mg/dL (ref >39)
LDL Chol Calc (NIH): 64 mg/dL (ref 0–99)
Triglycerides: 127 mg/dL (ref 0–149)
VLDL Cholesterol Cal: 22 mg/dL (ref 5–40)

## 2021-12-21 LAB — ALT: ALT: 20 IU/L (ref 0–44)

## 2021-12-21 LAB — POCT INR: INR: 1.7 — AB (ref 2.0–3.0)

## 2021-12-21 NOTE — Patient Instructions (Addendum)
A full discussion of the nature of anticoagulants has been carried out.  A benefit risk analysis has been presented to the patient, so that they understand the justification for choosing anticoagulation at this time. The need for frequent and regular monitoring, precise dosage adjustment and compliance is stressed.  Side effects of potential bleeding are discussed.  The patient should avoid any OTC items containing aspirin or ibuprofen, and should avoid great swings in general diet.  Avoid alcohol consumption.  Call if any signs of abnormal bleeding.  Description   Today take 7.5mg  (1.5 tablets) then continue taking 5mg  daily. Recheck INR in 1 week. Call the Coumadin Clinic with any upcoming procedures or new medications. [facility]in Clinic [phone]

## 2021-12-28 ENCOUNTER — Other Ambulatory Visit: Payer: Self-pay

## 2021-12-28 ENCOUNTER — Ambulatory Visit (INDEPENDENT_AMBULATORY_CARE_PROVIDER_SITE_OTHER): Payer: Medicare Other

## 2021-12-28 DIAGNOSIS — I4891 Unspecified atrial fibrillation: Secondary | ICD-10-CM | POA: Diagnosis not present

## 2021-12-28 DIAGNOSIS — Z5181 Encounter for therapeutic drug level monitoring: Secondary | ICD-10-CM

## 2021-12-28 LAB — POCT INR: INR: 2.2 (ref 2.0–3.0)

## 2021-12-28 NOTE — Patient Instructions (Signed)
Description   ?Continue taking 5mg  daily. Recheck INR in 1 week. Call the Coumadin Clinic with any upcoming procedures or new medications. [facility] [phone]  ? ? ?  ?   ?

## 2021-12-29 DIAGNOSIS — Z1389 Encounter for screening for other disorder: Secondary | ICD-10-CM | POA: Diagnosis not present

## 2021-12-29 DIAGNOSIS — Z0000 Encounter for general adult medical examination without abnormal findings: Secondary | ICD-10-CM | POA: Diagnosis not present

## 2022-01-02 ENCOUNTER — Other Ambulatory Visit: Payer: Self-pay | Admitting: Cardiology

## 2022-01-04 ENCOUNTER — Ambulatory Visit (INDEPENDENT_AMBULATORY_CARE_PROVIDER_SITE_OTHER): Payer: Medicare Other

## 2022-01-04 ENCOUNTER — Other Ambulatory Visit: Payer: Self-pay

## 2022-01-04 DIAGNOSIS — Z5181 Encounter for therapeutic drug level monitoring: Secondary | ICD-10-CM | POA: Diagnosis not present

## 2022-01-04 DIAGNOSIS — I4891 Unspecified atrial fibrillation: Secondary | ICD-10-CM

## 2022-01-04 LAB — POCT INR: INR: 2.6 (ref 2.0–3.0)

## 2022-01-04 NOTE — Patient Instructions (Signed)
Description   ?Continue taking 5mg  daily. Recheck INR in 1 week. Call the Coumadin Clinic with any upcoming procedures or new medications. [facility]in Clinic [phone]  ? ? ?  ?   ?

## 2022-01-07 DIAGNOSIS — Z20822 Contact with and (suspected) exposure to covid-19: Secondary | ICD-10-CM | POA: Diagnosis not present

## 2022-01-11 ENCOUNTER — Other Ambulatory Visit: Payer: Self-pay

## 2022-01-11 ENCOUNTER — Ambulatory Visit (INDEPENDENT_AMBULATORY_CARE_PROVIDER_SITE_OTHER): Payer: Medicare Other | Admitting: *Deleted

## 2022-01-11 DIAGNOSIS — Z5181 Encounter for therapeutic drug level monitoring: Secondary | ICD-10-CM | POA: Diagnosis not present

## 2022-01-11 DIAGNOSIS — I4891 Unspecified atrial fibrillation: Secondary | ICD-10-CM | POA: Diagnosis not present

## 2022-01-11 LAB — POCT INR: INR: 2.5 (ref 2.0–3.0)

## 2022-01-11 NOTE — Patient Instructions (Signed)
Description   ?Continue taking 5mg  daily. Recheck INR in 2 weeks. Call the Coumadin Clinic with any upcoming procedures or new medications. [facility] [phone]  ? ? ?  ?  ?

## 2022-01-12 ENCOUNTER — Encounter: Payer: Self-pay | Admitting: Neurology

## 2022-01-12 ENCOUNTER — Ambulatory Visit (INDEPENDENT_AMBULATORY_CARE_PROVIDER_SITE_OTHER): Payer: Medicare Other | Admitting: Neurology

## 2022-01-12 VITALS — BP 104/70 | HR 90 | Ht 73.0 in | Wt 202.0 lb

## 2022-01-12 DIAGNOSIS — G7000 Myasthenia gravis without (acute) exacerbation: Secondary | ICD-10-CM | POA: Diagnosis not present

## 2022-01-12 MED ORDER — PREDNISONE 10 MG PO TABS
10.0000 mg | ORAL_TABLET | Freq: Every day | ORAL | 3 refills | Status: DC
Start: 2022-01-12 — End: 2022-12-05

## 2022-01-12 MED ORDER — PYRIDOSTIGMINE BROMIDE 60 MG PO TABS: 30.0000 mg | ORAL_TABLET | Freq: Three times a day (TID) | ORAL | 3 refills | Status: DC

## 2022-01-12 NOTE — Patient Instructions (Signed)
Continue prednisone 10mg  daily ? ?Continue mestinon 30mg  three times daily ? ?Return to clinic in 6 months ?

## 2022-01-12 NOTE — Progress Notes (Signed)
? ? ?Follow-up Visit ? ? ?Date: 01/12/22 ?  ? ?[name] ?MRN: [id number] ?DOB: 1941-05-05 ? ? ?Interim History: ?[name] is a 81 y.o. right-handed Caucasian male with hyperlipidemia, COPD, peripheral neuropathy, SVT s/p ablation, afib on anticoagulation, BPH, former smoker, and diverticulitis returning to the clinic for follow-up of myasthenia gravis.  The patient was accompanied to the clinic by wife. ? ?History of present illness: ?Patient was diagnosed with myasthenia gravis in August 2019 after presenting with left ptosis, facial weakness, double vision, and dysphagia. He went to the ER for these symptoms where stroke was excluded and AChR antibodies returned positive. He was started on mestinon 30mg  three times daily (8am, 4pm, and midnight).  He was started on prednisone 20mg , with no improvement.  He was hospitalized from 9/19 - 07/23/2018 with MG exacerbated and treated with IVIG with great response. His prednisone was increased to 30mg  daily and he was started on IVIG, which helped control MG symptoms. During 2020, his prednisone was tapered and he has been on prednisone 10mg  daily.  ?  ?He also complains of severe neck pain which is worse with prolonged standing and neck extension.  He usually has to sit back down and rest for relief. He does not have numbness/tingling of the hands or weakness.  ?  ?He has previously been evaluated for neuropathy at [facility] and [place] with NCS/EMG which showed peripheral sensorimotor axonal neuropathy.  CSF testing was normal and genetic testing was normal.   ? ?UPDATE 05/25/2021:  He is here for follow-up visit.  He was at the beach last week and did well in the hot temperatures.  He denies any double vision, droopy eyelids, difficulty talking or swallowing, or limb weakness.  His wife states he has rare spells of difficulty swallowing, but also feels he may not be chewing his food.  He is compliant with prednisone 10mg  daily and mestinon 30mg  three  times daily. He rarely needs to take an extra mestinon.  He is seeing Dr. Jacelyn Grip for arthritic pain in the hip and back and getting injections.  ? ?UPDATE 01/12/2022:   He is here for follow-up visit.  His myasthenia gravis has been doing well since his last visit. No issues with vision, swallow/speech, or weakness.  Seldom his eye may get droopy, but it quickly resolves. He was hospitalized in January for diverticulitis.  He remains complaint with prednisone 10mg  daily and mestinon 30mg  TID.  No new complaints. Wife reports that patient does have a lot of anxiety over his health, and tries to reassure him. ? ?Medications:  ?Current Outpatient Medications on File Prior to Visit  ?Medication Sig Dispense Refill  ? atorvastatin (LIPITOR) 40 MG tablet Take 1 tablet (40 mg total) by mouth daily. 90 tablet 3  ? calcium carbonate (TUMS - DOSED IN MG ELEMENTAL CALCIUM) 500 MG chewable tablet Chew 500 mg by mouth daily.    ? Cyanocobalamin (VITAMIN B-12 PO) Take 5,000 mcg by mouth daily.     ? doxazosin (CARDURA) 8 MG tablet Take 8 mg by mouth daily.    ? finasteride (PROSCAR) 5 MG tablet 5 mg daily.    ? fluticasone (FLONASE) 50 MCG/ACT nasal spray Place 2 sprays into both nostrils daily.    ? glucosamine-chondroitin 500-400 MG tablet Take 1 tablet by mouth daily.    ? ipratropium (ATROVENT) 0.03 % nasal spray 1 spray daily.    ? metoprolol succinate (TOPROL-XL) 50 MG 24 hr tablet TAKE 1 TABLET (50  MG TOTAL) BY MOUTH DAILY. PLEASE KEEP UPCOMING APPT FOR FUTURE REFILLS. 90 tablet 3  ? montelukast (SINGULAIR) 10 MG tablet Take 10 mg by mouth daily.    ? Multiple Vitamin (MULTIVITAMIN WITH MINERALS) TABS tablet Take 1 tablet by mouth daily.    ? predniSONE (DELTASONE) 10 MG tablet Take 1 tablet (10 mg total) by mouth daily with breakfast. 90 tablet 3  ? pyridostigmine (MESTINON) 60 MG tablet Take 0.5 tablets (30 mg total) by mouth 3 (three) times daily. Take half tablet at 8am, 1pm, and 6pm 135 tablet 3  ? sodium chloride  (OCEAN) 0.65 % SOLN nasal spray Place 1 spray into both nostrils daily as needed for congestion.    ? warfarin (COUMADIN) 5 MG tablet Take 1 tablet (5 mg total) by mouth daily. Or as directed by Coumadin Clinic 35 tablet 1  ? albuterol (VENTOLIN HFA) 108 (90 Base) MCG/ACT inhaler Inhale 1-2 puffs into the lungs every 6 (six) hours as needed for wheezing or shortness of breath. 18 g 5  ? ?No current facility-administered medications on file prior to visit.  ? ? ?Allergies:  ?Allergies  ?Allergen Reactions  ? Ambrosia Trifida (Tall Ragweed) Allergy Skin Test Anaphylaxis  ? Bee Pollen Itching  ?  Nasal congestion  ? Beta Adrenergic Blockers   ?  Other reaction(s): MG  ? Ciprofloxacin   ?  Other reaction(s): myasthenia gravis  ? Eliquis [Apixaban]   ?  Throat tightening  ? Erythromycin   ?  Other reaction(s): MG  ? Pollen Extract Other (See Comments)  ?  Nasal congestion  ? ? ?Vital Signs:  ?BP 104/70   Pulse 90   Ht 6\' 1"  (1.854 m)   Wt 202 lb (91.6 kg)   SpO2 90%   BMI 26.65 kg/m?  ? ? ?Neurological Exam: ?MENTAL STATUS including orientation to time, place, person, recent and remote memory, attention span and concentration, language, and fund of knowledge is normal.  Speech is not dysarthric. ? ?CRANIAL NERVES:   Normal conjugate, extra-ocular eye movements in all directions of gaze.  Mild ptosis (stable)  Face is symmetric.  Orbicularis oculi, buccinator, and orbicularis oris is 5/5.  Tongue strength is 5/5 ? ?MOTOR:  Motor strength is 5/5 in all extremities.  Neck flexion is 5/5.  ? ?REFLEXES:  Reflexes are 2+/4 throughout, except absent at the ankles.  ? ?COORDINATION/GAIT:    Gait narrow based, stooped, mildly antalgic appearing. Unassisted, stable.  ? ?Data: ?Labs 06/15/2018:  AChR binding 1.69*, MUSK neg ?  ?MRI brain wo contrast 06/14/2018: ?Negative for acute infarct. ?Chronic microhemorrhage in the left frontal and left occipital parietal lobe most likely due hypertension. ?  ?CTA head and neck  06/14/2018: ?1. Negative for large vessel occlusion, dissection, or arterial stenosis in the head or neck. There is minimal atherosclerosis. ?2. Positive for generalized arterial tortuosity. Ectatic distal aortic arch and proximal descending aorta. ?3. A small 15 mm area of ground-glass opacity in the right upper lobe appears stable since 2015. 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 ?  ?NCS/EMG of the legs 03/13/2018:  This is an abnormal study. 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  ? ?CT chest 07/11/2018:  Negative for thymoma ? ?MRI cervical spine wo contrast 07/14/2018: ?1. No acute finding. ?2. Disc and facet degeneration causes foraminal impingement that is advanced on the left at c3-4 and on the left at C5-6. ?3. Mild right foraminal narrowing at C5-6 and C6-7. ? ? 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  Hospitalized in Sept 2019, responsive to IVIG and prednisone 30mg /d.  Since this time, he has been on a tapering dose prednisone and tolerated it well.  He has been on prednisone 20mg  since 2020 with no further exacerbations.  No evidence of disease manifestation today. ? - Continue prednisone 10mg  daily.  Refilled ? - Continue mestinon 30mg  three daily.  OK to take extra 30mg  as needed for breakthrough symptoms.  Refilled ? - If there is any true exacerbation during the summer months, increase prednisone 20mg  daily  ? ?Return to clinic 6 months ? ?Thank you for allowing me to participate in patient's care.  If I can answer any additional questions, I would be pleased to do so.   ? ?Sincerely, ? ? ? ?Misbah Hornaday K. [name], DO ? ? ?

## 2022-01-15 NOTE — Progress Notes (Signed)
? ?Synopsis: Referred for chronic cough by [name], MD ? ?Subjective:  ? ?PATIENT ID: [name] GENDER: male DOB: 05/15/1941, MRN: [id number] ? ?Chief Complaint  ?Patient presents with  ? Follow-up  ?  Increased cough x 2 wks- prod with white to yellow sputum.   ? ? ?79yM patient with chart history of COPD previously followed by [facility], stable seropositive bulbar MG dx 2019 without thymoma requiring chronic prednisone (on 10 mg daily) and mestinon with no recent dose change who is referred to pulmonary clinic for COPD with ongoing cough and congestion. ? 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 Never has lived anywhere else other than [place], [place]. ?Pets: No pets. No birds. No feather pillows or down comforters.  ?Travel: none pertinent ? ?Had ablation for some form of SVT vs AF about 6 years ago.  ? ?Interval HPI: ?He says he's had no hospitalizations since last visit. Had surveillance CT Chest ordered by PCP with stable RUL nodule. Coughs every night as he lies down for bed. But after he heads to sleep he's not coughing for the most part per his wife. Still has productive cough with greenish/whitish sputum. Has been taking his flonase, singulair, saline nasal spray. Has been off of symbicort for an unknown period of time.  ? ?Has mild DOE but he's unsure if the symbicort helped with it. He has no trouble at all with aspiration liquids or solids.  ? ?Back in November had covid-19. Congestion cleared up initially, worsening  ? ? ?Otherwise pertinent review of systems is negative. ? ? ? ?Past Medical History:  ?Diagnosis Date  ? Abscess of sigmoid colon due to diverticulitis 12/26/2018  ? Appendicitis 09/05/2014  ? Basal cell carcinoma 10/04/2015  ? left [id number]  ? BCC (basal cell carcinoma) 11/03/2019  ? left shin (txpbx)  ? Colon polyp   ?  Complication of anesthesia   ? Difficult to arouse, Combative x 1  ? COVID   ? Diverticulitis   ? Diverticulosis   ? extensive 3'15.  ? Dysrhythmia   ? hx. Ablation for tachycardia, no routine cardiology visits now  ? ED (erectile dysfunction)   ? Heart murmur   ? teenager  ? History of colon polyps   ? History of kidney stones   ? x1 passed  ? Hyperlipidemia   ? Myasthenia gravis ([place])   ? Neuromuscular disorder ([place])   ? "neuroma"   ? Osteoarthritis   ? Palpitation   ? with svt rate 150  ? 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 left cheek (cx70fu)  ? 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 Qualifier: Diagnosis of  By: [name], MD, [name]   ? Tendinitis   ? achiles heel spur  ? Testicular atrophy   ? Tubular adenoma of colon   ?  ? ?Family History  ?Problem Relation Age of Onset  ? Pancreatic cancer Father   ? Stroke Mother   ? Aneurysm Brother   ? Allergies Brother   ? Neuropathy Neg Hx   ?  ? ?Past Surgical History:  ?Procedure Laterality Date  ? CARDIAC ELECTROPHYSIOLOGY [place] AND ABLATION  2010  ? cardiac   ? COLONOSCOPY  11/2018  ? Dr [name].  Many polyps.  rec f/u colonoscopy summer 2020  ? EUS N/A 11/17/2014  ? Procedure: ESOPHAGEAL ENDOSCOPIC ULTRASOUND (  EUS) RADIAL;  Surgeon: [name], MD;  Location: [facility];  Service: Endoscopy;  Laterality: N/A;  ? FINE NEEDLE ASPIRATION N/A 11/17/2014  ? Procedure: FINE NEEDLE ASPIRATION (FNA) LINEAR;  Surgeon: [name], MD;  Location: [facility];  Service: Endoscopy;  Laterality: N/A;  + or- fna  ? FLEXIBLE SIGMOIDOSCOPY    ? HEMORRHOID SURGERY    ? banding   ? INGUINAL HERNIA REPAIR Bilateral 2005  ? '05 bilateral hernia  ? KNEE ARTHROSCOPY Left 2008  ? scope '08  ? LAPAROSCOPIC APPENDECTOMY N/A 09/05/2014  ? Procedure: APPENDECTOMY LAPAROSCOPIC;  Surgeon: [name], MD;  Location: [facility];  Service: General;  Laterality: N/A;  ? PROCTOSCOPY N/A 12/26/2018  ? Procedure: RIGID PROCTOSCOPY;  Surgeon: [name], MD;   Location: [facility];  Service: General;  Laterality: N/A;  ? ROTATOR CUFF REPAIR Right 2016  ? ? ?Social History  ? ?Socioeconomic History  ? Marital status: Married  ?  Spouse name: Not on file  ? Number of children: 3  ? Years of education: 57  ? Highest education level: Some college, no degree  ?Occupational History  ? Occupation: sales-Retired, works one day a week  ?Tobacco Use  ? Smoking status: Former  ?  Packs/day: 1.00  ?  Years: 47.00  ?  Pack years: 47.00  ?  Types: Cigarettes  ?  Quit date: 10/29/2000  ?  Years since quitting: 21.2  ? Smokeless tobacco: Never  ?Vaping Use  ? Vaping Use: Never used  ?Substance and Sexual Activity  ? Alcohol use: Yes  ?  Alcohol/week: 1.0 standard drink  ?  Types: 1 Standard drinks or equivalent per week  ?  Comment: socially- nightly 1 cocktail  ? Drug use: No  ? Sexual activity: Not on file  ?Other Topics Concern  ? Not on file  ?Social History Narrative  ? Lives with wife in a one story home with a basement.  Has 3 daughters.  Works one day a week at the [employer].  Retired from Press [employer].  Education: some college.   ? Caffeine use: decaf  ? Right handed  ? One story home  ? ?Social Determinants of Health  ? ?Financial Resource Strain: Not on file  ?Food Insecurity: Not on file  ?Transportation Needs: Not on file  ?Physical Activity: Not on file  ?Stress: Not on file  ?Social Connections: Not on file  ?Intimate Partner Violence: Not on file  ?  ? ?Allergies  ?Allergen Reactions  ? Ambrosia Trifida (Tall Ragweed) Allergy Skin Test Anaphylaxis  ? Bee Pollen Itching  ?  Nasal congestion  ? Beta Adrenergic Blockers   ?  Other reaction(s): MG  ? Ciprofloxacin   ?  Other reaction(s): myasthenia gravis  ? Eliquis [Apixaban]   ?  Throat tightening  ? Erythromycin   ?  Other reaction(s): MG  ? Pollen Extract Other (See Comments)  ?  Nasal congestion  ?  ? ?Outpatient Medications Prior to Visit  ?Medication Sig Dispense Refill  ? albuterol (VENTOLIN HFA) 108 (90 Base) MCG/ACT inhaler  Inhale 1-2 puffs into the lungs every 6 (six) hours as needed for wheezing or shortness of breath. 18 g 5  ? atorvastatin (LIPITOR) 40 MG tablet Take 1 tablet (40 mg total) by mouth daily. 90 tablet 3  ? calcium carbonate (TUMS - DOSED IN MG ELEMENTAL CALCIUM) 500 MG chewable tablet Chew 500 mg by mouth daily.    ? Cyanocobalamin (VITAMIN B-12 PO) Take 5,000 mcg  by mouth daily.     ? doxazosin (CARDURA) 8 MG tablet Take 8 mg by mouth daily.    ? finasteride (PROSCAR) 5 MG tablet 5 mg daily.    ? fluticasone (FLONASE) 50 MCG/ACT nasal spray Place 2 sprays into both nostrils daily.    ? glucosamine-chondroitin 500-400 MG tablet Take 1 tablet by mouth daily.    ? ipratropium (ATROVENT) 0.03 % nasal spray 1 spray daily.    ? metoprolol succinate (TOPROL-XL) 50 MG 24 hr tablet TAKE 1 TABLET (50 MG TOTAL) BY MOUTH DAILY. PLEASE KEEP UPCOMING APPT FOR FUTURE REFILLS. 90 tablet 3  ? montelukast (SINGULAIR) 10 MG tablet Take 10 mg by mouth daily.    ? Multiple Vitamin (MULTIVITAMIN WITH MINERALS) TABS tablet Take 1 tablet by mouth daily.    ? predniSONE (DELTASONE) 10 MG tablet Take 1 tablet (10 mg total) by mouth daily with breakfast. 90 tablet 3  ? pyridostigmine (MESTINON) 60 MG tablet Take 0.5 tablets (30 mg total) by mouth 3 (three) times daily. Take half tablet at 8am, 1pm, and 6pm 135 tablet 3  ? sodium chloride (OCEAN) 0.65 % SOLN nasal spray Place 1 spray into both nostrils daily as needed for congestion.    ? warfarin (COUMADIN) 5 MG tablet Take 1 tablet (5 mg total) by mouth daily. Or as directed by Coumadin Clinic 35 tablet 1  ? ?No facility-administered medications prior to visit.  ? ? ? ? ? ?Objective:  ? ?Physical Exam: ? ?General appearance: 81 y.o., male, NAD, conversant  ?Eyes: anicteric sclerae, moist conjunctivae; no lid-lag; PERRL, tracking appropriately ?HENT: NCAT; oropharynx, MMM, no mucosal ulcerations; normal hard and soft palate ?Neck: Trachea midline; no lymphadenopathy, no JVD ?Lungs: rhonchorous  L>R/upper airway wheeze, no crackles, with normal respiratory effort ?CV: RRR, no MRGs  ?Abdomen: Soft, non-tender; non-distended, BS present  ?Extremities: No peripheral edema, radial and DP pulses present bilatera

## 2022-01-16 ENCOUNTER — Encounter: Payer: Self-pay | Admitting: Student

## 2022-01-16 ENCOUNTER — Other Ambulatory Visit: Payer: Self-pay

## 2022-01-16 ENCOUNTER — Ambulatory Visit (INDEPENDENT_AMBULATORY_CARE_PROVIDER_SITE_OTHER): Payer: Medicare Other | Admitting: Student

## 2022-01-16 VITALS — BP 108/70 | HR 74 | Temp 98.1°F | Ht 72.0 in | Wt 201.8 lb

## 2022-01-16 DIAGNOSIS — R053 Chronic cough: Secondary | ICD-10-CM | POA: Diagnosis not present

## 2022-01-16 DIAGNOSIS — J449 Chronic obstructive pulmonary disease, unspecified: Secondary | ICD-10-CM | POA: Diagnosis not present

## 2022-01-16 NOTE — Patient Instructions (Addendum)
-   Keep up the good work with the flonase, singulair, and aerobika flutter valve 10 slow but firm puffs once daily. Can use atrovent nasal spray as needed to dry up postnasal drainage but not on long term basis. ?- Take shower clear nose of crusting then use your flonase 1 spray each nostril  ?- I will send message to Dr. [name] to discuss adding an inhaler for COPD (LABA/LAMA) ?- See you in 6 months or sooner if you have any issues come up! ? ?

## 2022-01-22 DIAGNOSIS — E782 Mixed hyperlipidemia: Secondary | ICD-10-CM | POA: Diagnosis not present

## 2022-01-22 DIAGNOSIS — J449 Chronic obstructive pulmonary disease, unspecified: Secondary | ICD-10-CM | POA: Diagnosis not present

## 2022-01-22 DIAGNOSIS — N401 Enlarged prostate with lower urinary tract symptoms: Secondary | ICD-10-CM | POA: Diagnosis not present

## 2022-01-25 ENCOUNTER — Ambulatory Visit (INDEPENDENT_AMBULATORY_CARE_PROVIDER_SITE_OTHER): Payer: Medicare Other | Admitting: *Deleted

## 2022-01-25 DIAGNOSIS — I4891 Unspecified atrial fibrillation: Secondary | ICD-10-CM

## 2022-01-25 DIAGNOSIS — Z7901 Long term (current) use of anticoagulants: Secondary | ICD-10-CM | POA: Diagnosis not present

## 2022-01-25 DIAGNOSIS — Z5181 Encounter for therapeutic drug level monitoring: Secondary | ICD-10-CM

## 2022-01-25 LAB — POCT INR: INR: 2.5 (ref 2.0–3.0)

## 2022-01-25 NOTE — Patient Instructions (Signed)
Description   ?Continue taking 5mg  daily. Recheck INR in 3 weeks. Call the Coumadin Clinic with any upcoming procedures or new medications. Coumadin Clinic [phone]  ? ? ?  ?  ?

## 2022-01-26 ENCOUNTER — Other Ambulatory Visit: Payer: Self-pay | Admitting: Cardiology

## 2022-01-26 DIAGNOSIS — Z20822 Contact with and (suspected) exposure to covid-19: Secondary | ICD-10-CM | POA: Diagnosis not present

## 2022-01-26 DIAGNOSIS — I4891 Unspecified atrial fibrillation: Secondary | ICD-10-CM

## 2022-02-07 DIAGNOSIS — Z20822 Contact with and (suspected) exposure to covid-19: Secondary | ICD-10-CM | POA: Diagnosis not present

## 2022-02-12 DIAGNOSIS — Z20822 Contact with and (suspected) exposure to covid-19: Secondary | ICD-10-CM | POA: Diagnosis not present

## 2022-02-15 ENCOUNTER — Telehealth: Payer: Self-pay | Admitting: *Deleted

## 2022-02-15 ENCOUNTER — Ambulatory Visit (INDEPENDENT_AMBULATORY_CARE_PROVIDER_SITE_OTHER): Payer: Medicare Other

## 2022-02-15 DIAGNOSIS — Z5181 Encounter for therapeutic drug level monitoring: Secondary | ICD-10-CM | POA: Diagnosis not present

## 2022-02-15 DIAGNOSIS — I4891 Unspecified atrial fibrillation: Secondary | ICD-10-CM

## 2022-02-15 DIAGNOSIS — Z7901 Long term (current) use of anticoagulants: Secondary | ICD-10-CM

## 2022-02-15 LAB — POCT INR: INR: 2.2 (ref 2.0–3.0)

## 2022-02-15 NOTE — Telephone Encounter (Signed)
Pt agreeable to plan of care for tele pre op appt 02/19/22 @ 10 am.. med rec and consent are done.  ? ?  ?Patient Consent for Virtual Visit  ? ? ?   ? ?[name] has provided verbal consent on 02/15/2022 for a virtual visit (video or telephone). ? ? ?CONSENT FOR VIRTUAL VISIT FOR:  [name]  ?By participating in this virtual visit I agree to the following: ? ?I hereby voluntarily request, consent and authorize [facility] and its employed or contracted physicians, physician assistants, nurse practitioners or other licensed health care professionals (the Practitioner), to provide me with telemedicine health care services (the ?Services") as deemed necessary by the treating Practitioner. I acknowledge and consent to receive the Services by the Practitioner via telemedicine. I understand that the telemedicine visit will involve communicating with the Practitioner through live audiovisual communication technology and the disclosure of certain medical information by electronic transmission. I acknowledge that I have been given the opportunity to request an in-person assessment or other available alternative prior to the telemedicine visit and am voluntarily participating in the telemedicine visit. ? ?I understand that I have the right to withhold or withdraw my consent to the use of telemedicine in the course of my care at any time, without affecting my right to future care or treatment, and that the Practitioner or I may terminate the telemedicine visit at any time. I understand that I have the right to inspect all information obtained and/or recorded in the course of the telemedicine visit and may receive copies of available information for a reasonable fee.  I understand that some of the potential risks of receiving the Services via telemedicine include:  ?Delay or interruption in medical evaluation due to technological equipment failure or disruption; ?Information transmitted may not be sufficient  (e.g. poor resolution of images) to allow for appropriate medical decision making by the Practitioner; and/or  ?In rare instances, security protocols could fail, causing a breach of personal health information. ? ?Furthermore, I acknowledge that it is my responsibility to provide information about my medical history, conditions and care that is complete and accurate to the best of my ability. I acknowledge that Practitioner's advice, recommendations, and/or decision may be based on factors not within their control, such as incomplete or inaccurate data provided by me or distortions of diagnostic images or specimens that may result from electronic transmissions. I understand that the practice of medicine is not an exact science and that Practitioner makes no warranties or guarantees regarding treatment outcomes. I acknowledge that a copy of this consent can be made available to me via my patient portal ([facility]), or I can request a printed copy by calling the office of [facility].   ? ?I understand that my insurance will be billed for this visit.  ? ?I have read or had this consent read to me. ?I understand the contents of this consent, which adequately explains the benefits and risks of the Services being provided via telemedicine.  ?I have been provided ample opportunity to ask questions regarding this consent and the Services and have had my questions answered to my satisfaction. ?I give my informed consent for the services to be provided through the use of telemedicine in my medical care ? ? ? ?

## 2022-02-15 NOTE — Telephone Encounter (Signed)
Patient with diagnosis of afib on warfarin for anticoagulation.   ? ?Procedure: colonoscopy ?Date of procedure: 03/06/22 ? ?CHA2DS2-VASc Score = 4  ?This indicates a 4.8% annual risk of stroke. ?The patient's score is based upon: ?CHF History: 0 ?HTN History: 1 ?Diabetes History: 0 ?Stroke History: 0 ?Vascular Disease History: 1 ?Age Score: 2 ?Gender Score: 0 ?  ?CrCl 30mL/min ?Platelet count 189K ? ?Per office protocol, patient can hold warfarin for 5 days prior to procedure. Patient will not need bridging with Lovenox around procedure. ?

## 2022-02-15 NOTE — Telephone Encounter (Signed)
? ? ?  Name: [name]  ?DOB: 30-Jun-1941  ?MRN: [id number] ? ?Primary Cardiologist: Candee Furbish, MD ? ?Since last OV was >2 months ago, recommend tele visit. ? ?Preoperative team, please contact this patient and set up a phone call appointment for further preoperative risk assessment. Please obtain consent and complete medication review. Thank you for your help. ? ?I confirm that guidance regarding antiplatelet and oral anticoagulation therapy has been completed and, if necessary, noted below. ? ? ? ?Charlie Pitter, PA-C ?02/15/2022, 4:50 PM ?[facility] ?[address] ?[place], [place] [address] ?  ?

## 2022-02-15 NOTE — Telephone Encounter (Signed)
? ?  Pre-operative Risk Assessment  ?  ?Patient Name: [name]  ?DOB: 1941/05/25 ?MRN: [id number]  ? ?  ? ?Request for Surgical Clearance   ? ?Procedure:   COLONOSCOPY ? ?Date of Surgery:  Clearance 03/06/22                              ?   ?Surgeon:  DR. [name]  ?Surgeon's Group or Practice Name:  [facility] ?Phone number:  [phone] ?Fax number:  [phone] ?  ?Type of Clearance Requested:   ?- Pharmacy:  Hold Warfarin (Coumadin)   ?  ?Type of Anesthesia:   PROPOFOL ?  ?Additional requests/questions:   ? ?Signed, ?Julaine Hua   ?02/15/2022, 1:11 PM  ? ?

## 2022-02-15 NOTE — Patient Instructions (Addendum)
Description   ?Continue taking 5mg  daily. Recheck INR in 2 weeks (prior to colonoscopy).  ?Call the Coumadin Clinic with any upcoming procedures or new medications.  ?Coumadin Clinic [phone]  ? ? ?  ?   ?

## 2022-02-15 NOTE — Telephone Encounter (Signed)
The pt was seen in our office today with our coumadin clinic. Per RN in coumadin clinic the pt tells her today that he has a colonoscopy 03/06/22. This is actually the colonoscopy that was to be done back in 06/2021, see clearance notes. I explained to the RN that we will need a new clearance request from Dr. Kathline Magic office. I stated that I will try to reach out to their office for the clearance information. ? ?I tried to call Dr. Kathline Magic office though no-one answered the phone, I was unable to s/w someone. I will fax this note to their office today advising they will need to send a new clearance request ASAP so that we may proceed with being able to clear the pt for the colonoscopy.   ?

## 2022-02-15 NOTE — Telephone Encounter (Signed)
Pt agreeable to plan of care for tele pre op appt 02/19/22 @ 10 am.. med rec and consent are done ?

## 2022-02-16 DIAGNOSIS — N210 Calculus in bladder: Secondary | ICD-10-CM | POA: Diagnosis not present

## 2022-02-16 DIAGNOSIS — J439 Emphysema, unspecified: Secondary | ICD-10-CM | POA: Diagnosis not present

## 2022-02-16 DIAGNOSIS — K5792 Diverticulitis of intestine, part unspecified, without perforation or abscess without bleeding: Secondary | ICD-10-CM | POA: Diagnosis not present

## 2022-02-16 DIAGNOSIS — Z79899 Other long term (current) drug therapy: Secondary | ICD-10-CM | POA: Diagnosis not present

## 2022-02-16 DIAGNOSIS — R7303 Prediabetes: Secondary | ICD-10-CM | POA: Diagnosis not present

## 2022-02-16 DIAGNOSIS — E782 Mixed hyperlipidemia: Secondary | ICD-10-CM | POA: Diagnosis not present

## 2022-02-16 DIAGNOSIS — Z23 Encounter for immunization: Secondary | ICD-10-CM | POA: Diagnosis not present

## 2022-02-16 DIAGNOSIS — E876 Hypokalemia: Secondary | ICD-10-CM | POA: Diagnosis not present

## 2022-02-16 DIAGNOSIS — R600 Localized edema: Secondary | ICD-10-CM | POA: Diagnosis not present

## 2022-02-16 DIAGNOSIS — G7000 Myasthenia gravis without (acute) exacerbation: Secondary | ICD-10-CM | POA: Diagnosis not present

## 2022-02-19 ENCOUNTER — Ambulatory Visit (INDEPENDENT_AMBULATORY_CARE_PROVIDER_SITE_OTHER): Payer: Medicare Other | Admitting: Physician Assistant

## 2022-02-19 DIAGNOSIS — Z01810 Encounter for preprocedural cardiovascular examination: Secondary | ICD-10-CM

## 2022-02-19 NOTE — Progress Notes (Signed)
? ?Virtual Visit via Telephone Note  ? ?This visit type was conducted due to national recommendations for restrictions regarding the COVID-19 Pandemic (e.g. social distancing) in an effort to limit this patient's exposure and mitigate transmission in our community.  Due to his co-morbid illnesses, this patient is at least at moderate risk for complications without adequate follow up.  This format is felt to be most appropriate for this patient at this time.  The patient did not have access to video technology/had technical difficulties with video requiring transitioning to audio format only (telephone).  All issues noted in this document were discussed and addressed.  No physical exam could be performed with this format.  Please refer to the patient's chart for his  consent to telehealth for [facility]. ? ?Evaluation Performed:  Preoperative cardiovascular risk assessment ?_____________  ? ?Date:  02/19/2022  ? ?Patient ID:  [name], [name] 09-Oct-1941, MRN [id number] ?Patient Location:  ?Home ?Provider location:   ?Office ? ?Primary Care Provider:  [name], MD ?Primary Cardiologist:  Candee Furbish, MD ? ?Chief Complaint  ?  ?81 y.o. y/o male with a h/o persistent Afib, pericarditis, and extensive GI history, who is pending  COLONOSCOPY, and presents today for telephonic preoperative cardiovascular risk assessment. ? ?Past Medical History  ?  ?Past Medical History:  ?Diagnosis Date  ? Abscess of sigmoid colon due to diverticulitis 12/26/2018  ? Appendicitis 09/05/2014  ? Basal cell carcinoma 10/04/2015  ? left deltoid-cx38fu  ? BCC (basal cell carcinoma) 11/03/2019  ? left shin (txpbx)  ? Colon polyp   ? Complication of anesthesia   ? Difficult to arouse, Combative x 1  ? COVID   ? Diverticulitis   ? Diverticulosis   ? extensive 3'15.  ? Dysrhythmia   ? hx. Ablation for tachycardia, no routine cardiology visits now  ? ED (erectile dysfunction)   ? Heart murmur   ? teenager  ? History of colon polyps    ? History of kidney stones   ? x1 passed  ? Hyperlipidemia   ? 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 Neuromuscular disorder ([place])   ? "neuroma"   ? Osteoarthritis   ? Palpitation   ? with svt rate 150  ? 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 left cheek (cx42fu)  ? 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 Qualifier: Diagnosis of  By: [name], MD, [name]   ? Tendinitis   ? achiles heel spur  ? Testicular atrophy   ? Tubular adenoma of colon   ? ?Past Surgical History:  ?Procedure Laterality Date  ? CARDIAC ELECTROPHYSIOLOGY [place] AND ABLATION  2010  ? cardiac   ? COLONOSCOPY  11/2018  ? Dr [name].  Many polyps.  rec f/u colonoscopy summer 2020  ? EUS N/A 11/17/2014  ? Procedure: ESOPHAGEAL ENDOSCOPIC ULTRASOUND (EUS) RADIAL;  Surgeon: [name], MD;  [facility];  Service: Endoscopy;  Laterality: N/A;  ? FINE NEEDLE ASPIRATION N/A 11/17/2014  ? Procedure: FINE NEEDLE ASPIRATION (FNA) LINEAR;  Surgeon: [name], MD;  [facility];  Service: Endoscopy;  Laterality: N/A;  + or- fna  ? FLEXIBLE SIGMOIDOSCOPY    ? HEMORRHOID SURGERY    ? banding   ? INGUINAL HERNIA REPAIR Bilateral 2005  ? '05 bilateral hernia  ? KNEE ARTHROSCOPY Left 2008  ? scope '08  ? LAPAROSCOPIC APPENDECTOMY N/A 09/05/2014  ? Procedure: APPENDECTOMY LAPAROSCOPIC;  Surgeon: [name], MD;  Location: [place];  Service: General;  Laterality:  N/A;  ? PROCTOSCOPY N/A 12/26/2018  ? Procedure: RIGID PROCTOSCOPY;  Surgeon: [name], MD;  Location: [facility];  Service: General;  Laterality: N/A;  ? ROTATOR CUFF REPAIR Right 2016  ? ? ?Allergies ? ?Allergies  ?Allergen Reactions  ? Ambrosia Trifida (Tall Ragweed) Allergy Skin Test Anaphylaxis  ? Bee Pollen Itching  ?  Nasal congestion  ? Beta Adrenergic Blockers   ?  Other reaction(s): MG  ? Ciprofloxacin   ?  Other reaction(s): myasthenia gravis  ? Eliquis [Apixaban]   ?  Throat tightening  ? Erythromycin   ?  Other reaction(s): MG  ? Pollen Extract Other  (See Comments)  ?  Nasal congestion  ? ? ?History of Present Illness  ?  ?[name] is a 81 y.o. male who presents via audio/video conferencing for a telehealth visit today.  Pt was last seen in cardiology clinic on 05/29/21 by Dr. Marlou Porch.  At that time [name] was doing well.  The patient is now pending  COLONOSCOPY.  Since his last visit, he remains active. He can complete 4.0 METS without angina, despite COPD and MG. He can climb a flight of stairs at home and walks the aisle of the grocery store without angina.  ? ? ?Home Medications  ?  ?Prior to Admission medications   ?Medication Sig Start Date End Date Taking? Authorizing Provider  ?albuterol (VENTOLIN HFA) 108 (90 Base) MCG/ACT inhaler Inhale 1-2 puffs into the lungs every 6 (six) hours as needed for wheezing or shortness of breath. 09/18/21 02/15/22  Maryjane Hurter, MD  ?atorvastatin (LIPITOR) 40 MG tablet Take 1 tablet (40 mg total) by mouth daily. 09/18/21   Jerline Pain, MD  ?calcium carbonate (TUMS - DOSED IN MG ELEMENTAL CALCIUM) 500 MG chewable tablet Chew 500 mg by mouth daily.    [provider]  ?Cyanocobalamin (VITAMIN B-12 PO) Take 5,000 mcg by mouth daily.     [provider]  ?doxazosin (CARDURA) 8 MG tablet Take 8 mg by mouth daily.    [provider]  ?finasteride (PROSCAR) 5 MG tablet 5 mg daily.    [provider]  ?fluticasone (FLONASE) 50 MCG/ACT nasal spray Place 2 sprays into both nostrils daily.    [provider]  ?glucosamine-chondroitin 500-400 MG tablet Take 1 tablet by mouth daily.    [provider]  ?ipratropium (ATROVENT) 0.03 % nasal spray 1 spray daily. 01/28/20   [provider]  ?metoprolol succinate (TOPROL-XL) 50 MG 24 hr tablet TAKE 1 TABLET (50 MG TOTAL) BY MOUTH DAILY. PLEASE KEEP UPCOMING APPT FOR FUTURE REFILLS. 07/17/21   Jerline Pain, MD  ?montelukast (SINGULAIR) 10 MG tablet Take 10 mg by mouth daily. 02/22/20   [provider]  ?Multiple Vitamin (MULTIVITAMIN WITH MINERALS) TABS tablet Take 1 tablet by mouth daily.    [provider]  ?predniSONE (DELTASONE) 10 MG tablet Take 1 tablet (10 mg total) by mouth daily with breakfast. 01/12/22   Narda Amber K, DO  ?pyridostigmine (MESTINON) 60 MG tablet Take 0.5 tablets (30 mg total) by mouth 3 (three) times daily. Take half tablet at 8am, 1pm, and 6pm 01/12/22   Narda Amber K, DO  ?sodium chloride (OCEAN) 0.65 % SOLN nasal spray Place 1 spray into both nostrils daily as needed for congestion.    [provider]  ?warfarin (COUMADIN) 5 MG tablet TAKE 1 TABLET (5 MG TOTAL) BY MOUTH DAILY. OR AS DIRECTED BY COUMADIN CLINIC _30  DAY SUPPLY 01/26/22   Jerline Pain, MD  ? ? ?Physical Exam  ?  ?Vital Signs:  ABDOU STOCKS does not have vital signs available for review today. ? ?Given telephonic nature of communication, physical exam is limited. ?AAOx3. NAD. Normal affect.  Speech and respirations are unlabored. ? ?Accessory Clinical Findings  ?  ?None ? ?Assessment & Plan  ?  ?1.  Preoperative Cardiovascular Risk Assessment: ? ?He can complete 4.0 METS without angina, despite COPD and MG.  He understands he may be at increased risk from unrealized CAD and wishes to proceed. He is overall low risk for this low risk procedure.  ? ?Therefore, based on ACC/AHA guidelines, the patient would be at acceptable risk for the planned procedure without further cardiovascular testing.  ? ?The patient was advised that if he develops new symptoms prior to surgery to contact our office to arrange for a follow-up visit, and he verbalized understanding. ? ? ?Per office protocol, patient can hold warfarin for 5 days prior to procedure. Patient will not need bridging with Lovenox around procedure.  ? ? ? ? ?A copy of this note will be routed to requesting surgeon. ? ?Time:   ?Today, I have spent 8 minutes with the patient with telehealth technology discussing medical history, symptoms, and  management plan.   ? ? ?Ledora Bottcher, PA ? ?02/19/2022, 10:27 AM ?

## 2022-02-28 ENCOUNTER — Ambulatory Visit (INDEPENDENT_AMBULATORY_CARE_PROVIDER_SITE_OTHER): Payer: Medicare Other | Admitting: *Deleted

## 2022-02-28 DIAGNOSIS — I4891 Unspecified atrial fibrillation: Secondary | ICD-10-CM | POA: Diagnosis not present

## 2022-02-28 DIAGNOSIS — Z5181 Encounter for therapeutic drug level monitoring: Secondary | ICD-10-CM | POA: Diagnosis not present

## 2022-02-28 DIAGNOSIS — Z7901 Long term (current) use of anticoagulants: Secondary | ICD-10-CM

## 2022-02-28 LAB — POCT INR: INR: 2.7 (ref 2.0–3.0)

## 2022-02-28 NOTE — Patient Instructions (Signed)
Description   ?Continue taking 5mg  daily. Follow instructions for last dose of warfarin for upcoming procedure. Recheck INR in 1 week (post colonoscopy).  ?Call the Coumadin Clinic with any upcoming procedures or new medications.  ?Coumadin Clinic [phone]  ? ? ?  ? Take your last dose of Warfarin on tonight then start holding for colonoscopy. When you resume your warfarin take an extra half of a tablet for 2 days then resume normal schedule. Call [phone] in the Coumadin Clinic  ?

## 2022-03-02 DIAGNOSIS — Z20822 Contact with and (suspected) exposure to covid-19: Secondary | ICD-10-CM | POA: Diagnosis not present

## 2022-03-05 DIAGNOSIS — Z20822 Contact with and (suspected) exposure to covid-19: Secondary | ICD-10-CM | POA: Diagnosis not present

## 2022-03-06 DIAGNOSIS — K5730 Diverticulosis of large intestine without perforation or abscess without bleeding: Secondary | ICD-10-CM | POA: Diagnosis not present

## 2022-03-06 DIAGNOSIS — Z980 Intestinal bypass and anastomosis status: Secondary | ICD-10-CM | POA: Diagnosis not present

## 2022-03-06 DIAGNOSIS — Z86010 Personal history of colonic polyps: Secondary | ICD-10-CM | POA: Diagnosis not present

## 2022-03-06 DIAGNOSIS — D125 Benign neoplasm of sigmoid colon: Secondary | ICD-10-CM | POA: Diagnosis not present

## 2022-03-06 DIAGNOSIS — D124 Benign neoplasm of descending colon: Secondary | ICD-10-CM | POA: Diagnosis not present

## 2022-03-06 DIAGNOSIS — Z09 Encounter for follow-up examination after completed treatment for conditions other than malignant neoplasm: Secondary | ICD-10-CM | POA: Diagnosis not present

## 2022-03-06 DIAGNOSIS — K648 Other hemorrhoids: Secondary | ICD-10-CM | POA: Diagnosis not present

## 2022-03-08 DIAGNOSIS — D124 Benign neoplasm of descending colon: Secondary | ICD-10-CM | POA: Diagnosis not present

## 2022-03-15 ENCOUNTER — Ambulatory Visit (INDEPENDENT_AMBULATORY_CARE_PROVIDER_SITE_OTHER): Payer: Medicare Other

## 2022-03-15 DIAGNOSIS — I4891 Unspecified atrial fibrillation: Secondary | ICD-10-CM | POA: Diagnosis not present

## 2022-03-15 DIAGNOSIS — Z7901 Long term (current) use of anticoagulants: Secondary | ICD-10-CM | POA: Diagnosis not present

## 2022-03-15 DIAGNOSIS — Z5181 Encounter for therapeutic drug level monitoring: Secondary | ICD-10-CM | POA: Diagnosis not present

## 2022-03-15 LAB — POCT INR: INR: 1.4 — AB (ref 2.0–3.0)

## 2022-03-15 NOTE — Patient Instructions (Signed)
Description   Take 2 tablets (10mg ) today and 1.5 tablets (7.5mg ) tomorrow and then continue taking 5mg  daily.  Recheck INR in 1 week.  Call the Coumadin Clinic with any upcoming procedures or new medications.  Coumadin Clinic [phone]

## 2022-03-22 ENCOUNTER — Ambulatory Visit (INDEPENDENT_AMBULATORY_CARE_PROVIDER_SITE_OTHER): Payer: Medicare Other | Admitting: *Deleted

## 2022-03-22 DIAGNOSIS — Z5181 Encounter for therapeutic drug level monitoring: Secondary | ICD-10-CM

## 2022-03-22 DIAGNOSIS — I4891 Unspecified atrial fibrillation: Secondary | ICD-10-CM | POA: Diagnosis not present

## 2022-03-22 DIAGNOSIS — Z7901 Long term (current) use of anticoagulants: Secondary | ICD-10-CM

## 2022-03-22 LAB — POCT INR: INR: 2 (ref 2.0–3.0)

## 2022-03-22 NOTE — Patient Instructions (Signed)
Description   Today take 7.5mg  (1.5 tablets) than continue taking 5mg  daily. Recheck INR in 2 weeks.  Call the Coumadin Clinic with any upcoming procedures or new medications.  Coumadin Clinic [phone]

## 2022-03-23 DIAGNOSIS — E782 Mixed hyperlipidemia: Secondary | ICD-10-CM | POA: Diagnosis not present

## 2022-03-23 DIAGNOSIS — Z79899 Other long term (current) drug therapy: Secondary | ICD-10-CM | POA: Diagnosis not present

## 2022-03-23 DIAGNOSIS — R7303 Prediabetes: Secondary | ICD-10-CM | POA: Diagnosis not present

## 2022-03-29 DIAGNOSIS — Z79899 Other long term (current) drug therapy: Secondary | ICD-10-CM | POA: Diagnosis not present

## 2022-03-29 DIAGNOSIS — M79671 Pain in right foot: Secondary | ICD-10-CM | POA: Diagnosis not present

## 2022-03-29 DIAGNOSIS — R7303 Prediabetes: Secondary | ICD-10-CM | POA: Diagnosis not present

## 2022-03-29 DIAGNOSIS — R600 Localized edema: Secondary | ICD-10-CM | POA: Diagnosis not present

## 2022-03-29 DIAGNOSIS — J439 Emphysema, unspecified: Secondary | ICD-10-CM | POA: Diagnosis not present

## 2022-03-29 DIAGNOSIS — E782 Mixed hyperlipidemia: Secondary | ICD-10-CM | POA: Diagnosis not present

## 2022-03-29 DIAGNOSIS — D692 Other nonthrombocytopenic purpura: Secondary | ICD-10-CM | POA: Diagnosis not present

## 2022-03-29 DIAGNOSIS — M79672 Pain in left foot: Secondary | ICD-10-CM | POA: Diagnosis not present

## 2022-03-29 DIAGNOSIS — G7000 Myasthenia gravis without (acute) exacerbation: Secondary | ICD-10-CM | POA: Diagnosis not present

## 2022-04-04 ENCOUNTER — Other Ambulatory Visit: Payer: Self-pay | Admitting: Cardiology

## 2022-04-04 ENCOUNTER — Ambulatory Visit (INDEPENDENT_AMBULATORY_CARE_PROVIDER_SITE_OTHER): Payer: Medicare Other

## 2022-04-04 DIAGNOSIS — I4891 Unspecified atrial fibrillation: Secondary | ICD-10-CM

## 2022-04-04 DIAGNOSIS — Z7901 Long term (current) use of anticoagulants: Secondary | ICD-10-CM

## 2022-04-04 DIAGNOSIS — Z5181 Encounter for therapeutic drug level monitoring: Secondary | ICD-10-CM | POA: Diagnosis not present

## 2022-04-04 LAB — POCT INR: INR: 2.2 (ref 2.0–3.0)

## 2022-04-04 NOTE — Patient Instructions (Signed)
Description   Continue taking 5mg  daily.  Recheck INR in 3 weeks.  Call the Coumadin Clinic with any upcoming procedures or new medications.  Coumadin Clinic [phone]

## 2022-04-11 ENCOUNTER — Other Ambulatory Visit: Payer: Self-pay

## 2022-04-11 MED ORDER — METOPROLOL SUCCINATE ER 50 MG PO TB24: 50.0000 mg | ORAL_TABLET | Freq: Every day | ORAL | 3 refills | Status: DC

## 2022-04-11 NOTE — Telephone Encounter (Signed)
Pt's medication was sent to pt's pharmacy as requested. Confirmation received.  °

## 2022-04-13 DIAGNOSIS — M542 Cervicalgia: Secondary | ICD-10-CM | POA: Diagnosis not present

## 2022-04-13 DIAGNOSIS — M898X1 Other specified disorders of bone, shoulder: Secondary | ICD-10-CM | POA: Diagnosis not present

## 2022-04-19 ENCOUNTER — Ambulatory Visit: Payer: Medicare Other | Attending: Sports Medicine | Admitting: Rehabilitative and Restorative Service Providers"

## 2022-04-19 ENCOUNTER — Encounter: Payer: Self-pay | Admitting: Rehabilitative and Restorative Service Providers"

## 2022-04-19 ENCOUNTER — Other Ambulatory Visit: Payer: Self-pay

## 2022-04-19 DIAGNOSIS — M6281 Muscle weakness (generalized): Secondary | ICD-10-CM | POA: Diagnosis not present

## 2022-04-19 DIAGNOSIS — M546 Pain in thoracic spine: Secondary | ICD-10-CM | POA: Diagnosis not present

## 2022-04-19 DIAGNOSIS — R293 Abnormal posture: Secondary | ICD-10-CM | POA: Insufficient documentation

## 2022-04-19 DIAGNOSIS — R262 Difficulty in walking, not elsewhere classified: Secondary | ICD-10-CM | POA: Insufficient documentation

## 2022-04-19 DIAGNOSIS — M542 Cervicalgia: Secondary | ICD-10-CM | POA: Diagnosis not present

## 2022-04-19 NOTE — Therapy (Signed)
OUTPATIENT PHYSICAL THERAPY CERVICAL EVALUATION   Patient Name: [name] MRN: [id number] DOB:December 13, 1940, 81 y.o., male Today's Date: 04/19/2022   PT End of Session - 04/19/22 1645     Visit Number 1    Date for PT Re-Evaluation 06/15/22    Authorization Type Med A    Progress Note Due on Visit 10    PT Start Time 1530    PT Stop Time 1610    PT Time Calculation (min) 40 min    Activity Tolerance Patient tolerated treatment well    Behavior During Therapy [facility] for tasks assessed/performed             Past Medical History:  Diagnosis Date   Abscess of sigmoid colon due to diverticulitis 12/26/2018   Appendicitis 09/05/2014   Basal cell carcinoma 10/04/2015   left deltoid-cx74fu   BCC (basal cell carcinoma) 11/03/2019   left shin (txpbx)   Colon polyp    Complication of anesthesia    Difficult to arouse, Combative x 1   COVID    Diverticulitis    Diverticulosis    extensive 3'15.   Dysrhythmia    hx. 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  Many polyps.  rec f/u colonoscopy summer 2020   EUS N/A 11/17/2014   Procedure: ESOPHAGEAL ENDOSCOPIC ULTRASOUND (EUS) RADIAL;  Surgeon: [name], MD;  [facility];  Service: Endoscopy;  Laterality: N/A;    FINE NEEDLE ASPIRATION N/A 11/17/2014   Procedure: FINE NEEDLE ASPIRATION (FNA) LINEAR;  Surgeon: [name], MD;  [facility];  Service: Endoscopy;  Laterality: N/A;  + or- fna   FLEXIBLE SIGMOIDOSCOPY     HEMORRHOID SURGERY     banding    INGUINAL HERNIA REPAIR Bilateral 2005   '05 bilateral hernia   KNEE ARTHROSCOPY Left 2008   scope '08   LAPAROSCOPIC APPENDECTOMY N/A 09/05/2014   Procedure: APPENDECTOMY LAPAROSCOPIC;  Surgeon: [name], MD;  Location: [place];  Service: General;  Laterality: N/A;   PROCTOSCOPY N/A 12/26/2018   Procedure: RIGID PROCTOSCOPY;  Surgeon: [name], MD;  [facility];  Service: General;  Laterality: N/A;   ROTATOR CUFF REPAIR Right 2016   Patient Active Problem List   Diagnosis Date Noted   Atrial fibrillation ([place]) 12/21/2021   Long term (current) use of anticoagulants 12/21/2021   Diverticulitis 11/03/2021   Aortic atherosclerosis ([place]) 04/18/2021   Cough, persistent 09/15/2019   Gastroesophageal reflux disease without esophagitis 09/15/2019   Hoarseness 09/15/2019   Upper airway cough syndrome 03/12/2019   History of diverticulitis of colon 12/26/2018   History of colonic polyps 12/26/2018  Diverticular disease s/p robotic left colectomy/LAR 12/26/2018 12/26/2018   Diverticulitis of sigmoid colon 12/26/2018   Hyperlipidemia 08/16/2018   Hypokalemia 08/16/2018   Myasthenia ([place]) 07/17/2018   Facial droop 06/14/2018   BPH (benign prostatic hyperplasia) 06/14/2018   HTN (hypertension) 06/14/2018   Idiopathic polyneuropathy 03/18/2018   Unilateral primary osteoarthritis, left knee 03/17/2018   Mass of stomach 11/02/2014   COPD GOLD II 10/27/2014   Solitary pulmonary nodule 10/26/2014   Palpitations 02/15/2011    PCP: [name] MD was previous PCP, but he retired and pt is going to be set up with a new PCP  REFERRING PROVIDER: Inez Catalina, MD   REFERRING DIAG: M54.2 (ICD-10-CM) - Cervicalgia    THERAPY DIAG:  Difficulty in walking, not elsewhere classified  Muscle weakness (generalized)  Abnormal posture  Pain in thoracic spine  Cervicalgia  Rationale for Evaluation and Treatment Rehabilitation  ONSET DATE: 04/13/2022   SUBJECTIVE:                                                                                                                                                                                                         SUBJECTIVE STATEMENT: Pt reports that he was referred for neck pain, but states that he is really having more pain in the middle of his back. Pt states that he has had back pain "for years" but it has gotten worse over the past 2-3 months. Pt reports that he was having some neck pain, but it has improved since PT ordered.  PERTINENT HISTORY:  OA, HTN, COPD, Myasthenia Gravis, Diverticulitis, A-Fib  PAIN:  Are you having pain? Yes: NPRS scale: 2-10/10 Pain location: right sided thoracic spine Pain description: steady pain Aggravating factors: increased standing and walking Relieving factors: lying down or sitting down in recliner  PRECAUTIONS: Other: Bleeding (on Warfarin).  Per patient, neurologist does not want him lifting anything over 10 pounds  WEIGHT BEARING RESTRICTIONS No  FALLS:  Has patient fallen in last 6 months? No  LIVING ENVIRONMENT: Lives with: lives with their spouse Lives in: House/apartment Stairs: Yes: Internal: 13 steps; on left going up and External: 12 steps; on right going up Has following equipment at home: Single point cane, Crutches, and Grab bars  OCCUPATION: Retired  PLOF: Independent and Leisure: "put around the house and do odds/ends".  "I used to walk the 1 mile around my block everyday, but I can't do that anymore"  PATIENT GOALS:  I want to be able to walk pain free in my back.  OBJECTIVE:   DIAGNOSTIC FINDINGS:  MRI on 11/25/2021 IMPRESSION: Nonspecific diffuse bone marrow heterogeneity. No  focal aggressive osseous lesion. Multilevel spondylosis. Superiorly migrated left L4-5 subarticular extrusion with mild spinal canal and bilateral neural foraminal narrowing. Mild bilateral L1-3 neural foraminal narrowing.  PATIENT SURVEYS:  FOTO 54% at initial evaluation (predicted 63% by visit 10)   COGNITION: Overall cognitive status: Within functional limits for tasks assessed   SENSATION: WFL  POSTURE: rounded shoulders and forward head  PALPATION: Tender to palpation along right upper trap and thoracic multifidi   CERVICAL ROM:   Active ROM A/PROM (deg) eval  Flexion 52  Extension 35  Right lateral flexion 30  Left lateral flexion 35  Right rotation 50  Left rotation 45   (Blank rows = not tested)  UPPER EXTREMITY ROM:  Right shoulder flexion:  132 degrees Right shoulder abduction:  134 degrees Left shoulder flexion and abduction:  134 degrees  UPPER EXTREMITY MMT:  04/19/2022:  R shoulder strength of grossly 4/5, L shoulder strength WFL  FUNCTIONAL TESTS:  5 times sit to stand: 14.5 sec with UE pushing up from thighs Timed up and go (TUG): 11.4 sec without assistive device   TODAY'S TREATMENT:  04/19/2022:  Review of HEP and providing printout (see below)   PATIENT EDUCATION:  Education details: Issued HEP Person educated: Patient Education method: Explanation, Demonstration, and Handouts Education comprehension: verbalized understanding and returned demonstration   HOME EXERCISE PROGRAM: Access Code: [email or web address] URL: [email or web address] Date: 04/19/2022 Prepared by: Juel Burrow  Exercises - Seated Scapular Retraction  - 1 x daily - 7 x weekly - 2 sets - 10 reps - Seated Cervical Retraction  - 1 x daily - 7 x weekly - 2 sets - 10 reps - Seated Upper Trapezius Stretch  - 1 x daily - 7 x weekly - 1 sets - 2 reps - 20 sec hold - Seated Levator Scapulae Stretch  - 1 x daily - 7 x weekly - 1 sets - 2 reps - 20 sec hold - Seated  Shoulder Rolls  - 1 x daily - 7 x weekly - 2 sets - 10 reps - Doorway Pec Stretch at 90 Degrees Abduction  - 1 x daily - 7 x weekly - 1 sets - 2 reps - 20 sec hold  ASSESSMENT:  CLINICAL IMPRESSION: Patient is an 81 y.o. male who was seen today for physical therapy evaluation and treatment for a medical diagnosis of cervicalgia. Pt states that his cervical pain that he had with Dr Sheppard Coil is primarily gone, but he is having thoracic pain, especially on the right side.  Pt presents with decreased cervical A/ROM, postural abnormalities, right upper extremity weakness, and difficulty performing functional tasks.  Pt reports that he has had a general decline in his health secondary to recent medical procedures and has gone from being able to walk for a mile to sometimes having difficulty walking to the bottom of his driveway and back.  Pt would benefit from skilled PT to progress towards goal related activities and improvements in ability to ambulate with less pain.   OBJECTIVE IMPAIRMENTS decreased activity tolerance, difficulty walking, decreased ROM, decreased strength, impaired UE functional use, postural dysfunction, and pain.   ACTIVITY LIMITATIONS carrying, lifting, standing, and stairs  PARTICIPATION LIMITATIONS: community activity and yard work  PERSONAL FACTORS Age and 3+ comorbidities: OA, HTN, COPD, Myasthenia Gravis, Diverticulitis, A-Fib  are also affecting patient's functional outcome.   REHAB POTENTIAL: Good  CLINICAL DECISION MAKING: Evolving/moderate complexity  EVALUATION COMPLEXITY: Moderate  GOALS: Goals reviewed with patient? Yes  SHORT TERM GOALS: Target date: 05/17/2022   Pt will be independent with initial HEP. Baseline:  Goal status: INITIAL  2.  Pt will report at least a 30% improvement in condition since starting PT. Baseline:  Goal status: INITIAL   LONG TERM GOALS: Target date: 06/14/2022  Pt will be independent with advanced HEP. Baseline:  Goal  status: INITIAL  2.  Pt will increase FOTO to at least 63% to demonstrate improvements in functional mobility. Baseline: 54% Goal status: INITIAL  3.  Pt will increase right upper extremity strength to at least 4+/5 to allow him to perform functional tasks with increased ease. Baseline: 4/5 Goal status: INITIAL  4.  Pt will report ability to ambulate for at least 30 minutes with no increase in pain to allow him to go out in the community or walk in neighborhood. Baseline:  Goal status: INITIAL  5.  Pt to demonstrate improved awareness of posture and body mechanics throughout PT session. Baseline:  Goal status: INITIAL    PLAN: PT FREQUENCY: 2x/week  PT DURATION: 8 weeks  PLANNED INTERVENTIONS: Therapeutic exercises, Therapeutic activity, Neuromuscular re-education, Balance training, Gait training, Patient/Family education, Joint manipulation, Joint mobilization, Stair training, Aquatic Therapy, Dry Needling, Electrical stimulation, Spinal manipulation, Spinal mobilization, Cryotherapy, Moist heat, Taping, Vasopneumatic device, Traction, Ultrasound, Ionotophoresis 4mg /ml Dexamethasone, Manual therapy, and Re-evaluation  PLAN FOR NEXT SESSION: Pt wishes to continue PT at [facility] location, he was provided number to call to schedule follow up appointments.  Progress and assess HEP, strengthening, core stability, postural re-education   Juel Burrow, PT 04/19/2022, 4:46 PM  [facility] [address] [place], [place] [address] Phone # [phone] Fax [phone]

## 2022-04-23 NOTE — Therapy (Addendum)
OUTPATIENT PHYSICAL THERAPY TREATMENT NOTE/DC SUMMARY   Patient Name: [name] MRN: [id number] DOB:08-22-1941, 81 y.o., male Today's Date: 04/24/2022  PCP: [name], MD REFERRING PROVIDER: Inez Catalina, MD  PHYSICAL THERAPY DISCHARGE SUMMARY  Visits from Start of Care: 2  Current functional level related to goals / functional outcomes: UTA   Remaining deficits: UTA   Education / Equipment: HEP   Patient agrees to discharge. Patient goals were partially met. Patient is being discharged due to not returning since the last visit.  END OF SESSION:   PT End of Session - 04/24/22 1541     Visit Number 2    Number of Visits 16    Date for PT Re-Evaluation 06/15/22    Authorization Type Med A    Progress Note Due on Visit 10    PT Start Time 1400    PT Stop Time 1435    PT Time Calculation (min) 35 min    Activity Tolerance Patient tolerated treatment well    Behavior During Therapy WFL for tasks assessed/performed             Past Medical History:  Diagnosis Date   Abscess of sigmoid colon due to diverticulitis 12/26/2018   Appendicitis 09/05/2014   Basal cell carcinoma 10/04/2015   left deltoid-cx66f   BCC (basal cell carcinoma) 11/03/2019   left shin (txpbx)   Colon polyp    Complication of anesthesia    Difficult to arouse, Combative x 1   COVID    Diverticulitis    Diverticulosis    extensive 3'15.   Dysrhythmia    hx. 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  rec f/u colonoscopy summer 2020   EUS N/A 11/17/2014   Procedure: ESOPHAGEAL ENDOSCOPIC ULTRASOUND (EUS) RADIAL;  Surgeon: WiArta SilenceMD;  [facility];  Service: Endoscopy;  Laterality: N/A;   FINE NEEDLE ASPIRATION N/A 11/17/2014   Procedure: FINE NEEDLE ASPIRATION (FNA) LINEAR;  Surgeon: WiArta SilenceMD;  [facility];  Service: Endoscopy;  Laterality: N/A;  + or- fna   FLEXIBLE SIGMOIDOSCOPY     HEMORRHOID SURGERY     banding    INGUINAL HERNIA REPAIR Bilateral 2005   '05 bilateral hernia   KNEE ARTHROSCOPY Left 2008   scope '08   LAPAROSCOPIC APPENDECTOMY N/A 09/05/2014   Procedure: APPENDECTOMY LAPAROSCOPIC;  Surgeon: DoCoralie KeensMD;  [place] Service: General;  Laterality: N/A;   PROCTOSCOPY N/A 12/26/2018   Procedure: RIGID PROCTOSCOPY;  Surgeon: GrMichael BostonMD;  [facility];  Service: General;  Laterality: N/A;   ROTATOR  CUFF REPAIR Right 2016   Patient Active Problem List   Diagnosis Date Noted   Atrial fibrillation ([place]) 12/21/2021   Long term (current) use of anticoagulants 12/21/2021   Diverticulitis 11/03/2021   Aortic atherosclerosis ([place]) 04/18/2021   Cough, persistent 09/15/2019   Gastroesophageal reflux disease without esophagitis 09/15/2019   Hoarseness 09/15/2019   Upper airway cough syndrome 03/12/2019   History of diverticulitis of colon 12/26/2018   History of colonic polyps 12/26/2018   Diverticular disease s/p robotic left colectomy/LAR 12/26/2018 12/26/2018   Diverticulitis of sigmoid colon 12/26/2018   Hyperlipidemia 08/16/2018   Hypokalemia 08/16/2018   Myasthenia ([place]) 07/17/2018   Facial droop 06/14/2018   BPH (benign prostatic hyperplasia) 06/14/2018   HTN  (hypertension) 06/14/2018   Idiopathic polyneuropathy 03/18/2018   Unilateral primary osteoarthritis, left knee 03/17/2018   Mass of stomach 11/02/2014   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  Per patient, neurologist does not want him lifting anything over 10 pounds  SUBJECTIVE: Reports low back pain as opposed to neck pain.  Pain localized to R lower thoracic spine.   Has a history of MG and is limited in his activity tolerance requesting to limit session to tolerance   PAIN:  Are you having pain? Yes: NPRS scale: 8/10 Pain location: R thoracic Pain description: ache  Aggravating factors: standing/walking Relieving factors: reliner, OTC muscle rubs   OBJECTIVE: (objective measures completed at initial evaluation unless otherwise dated)  OBJECTIVE:    DIAGNOSTIC FINDINGS:  MRI on 11/25/2021 IMPRESSION: Nonspecific diffuse bone marrow heterogeneity. No focal aggressive osseous lesion. Multilevel spondylosis. Superiorly migrated left L4-5 subarticular extrusion with mild spinal canal and bilateral neural foraminal narrowing. Mild bilateral L1-3 neural foraminal narrowing.   PATIENT SURVEYS:  FOTO 54% at initial evaluation (predicted 63% by visit 10)     COGNITION: Overall cognitive status: Within functional limits for tasks assessed     SENSATION: WFL   POSTURE: rounded shoulders and forward head   PALPATION: Tender to palpation along right upper trap and thoracic multifidi           CERVICAL ROM:    Active ROM A/PROM  (deg) eval  Flexion 52  Extension 35  Right lateral flexion 30  Left lateral flexion 35  Right rotation 50  Left rotation 45   (Blank rows = not tested)   UPPER EXTREMITY ROM:   Right shoulder flexion:  132 degrees Right shoulder abduction:  134 degrees Left shoulder flexion and abduction:  134 degrees   UPPER EXTREMITY MMT:   04/19/2022:  R shoulder strength of grossly 4/5, L shoulder strength WFL   FUNCTIONAL TESTS:  5 times sit to stand: 14.5 sec with UE pushing up from thighs Timed up and go (TUG): 11.4 sec without assistive device     TODAY'S TREATMENT:  [facility] Adult PT Treatment:  DATE: 04/24/22 Therapeutic Exercise: Cross body piriformis stretch 30s x2 B Clams 15x B Supine marching 15/15   DKTC over black P-ball with OP 15x LTR over black P-ball 10/10 2s hold PPT 10x Clams 10/10 Open books 10/10  04/19/2022:  Review of HEP and providing printout (see below)     PATIENT EDUCATION:  Education details: Issued HEP Person educated: Patient Education method: Explanation, Demonstration, and Handouts Education comprehension: verbalized understanding and returned demonstration     HOME EXERCISE PROGRAM: Access Code: [email or web address] URL: [email or web address] Date: 04/19/2022 Prepared by: Juel Burrow   Exercises - Seated Scapular Retraction  - 1 x daily - 7 x weekly - 2 sets - 10 reps - Seated Cervical Retraction  - 1 x daily - 7 x weekly - 2 sets - 10 reps - Seated Upper Trapezius Stretch  - 1 x daily - 7 x weekly - 1 sets - 2 reps - 20 sec hold - Seated Levator Scapulae Stretch  - 1 x daily - 7 x weekly - 1 sets - 2 reps - 20 sec hold - Seated Shoulder Rolls  - 1 x daily - 7 x weekly - 2 sets - 10 reps - Doorway Pec Stretch at 90 Degrees Abduction  - 1 x daily - 7 x weekly - 1 sets - 2 reps - 20 sec hold   ASSESSMENT:   CLINICAL IMPRESSION: Patient returns for first f/u session.  Initiated LE/hip strength  and stretch tasks to tolerance, flexibility exercises for lumbar and thoracic spines.  Exercises and stretches to tolerance due to underlying MG and diminished exercise tolerance.       OBJECTIVE IMPAIRMENTS decreased activity tolerance, difficulty walking, decreased ROM, decreased strength, impaired UE functional use, postural dysfunction, and pain.    ACTIVITY LIMITATIONS carrying, lifting, standing, and stairs   PARTICIPATION LIMITATIONS: community activity and yard work   PERSONAL FACTORS Age and 3+ comorbidities: OA, HTN, COPD, Myasthenia Gravis, Diverticulitis, A-Fib  are also affecting patient's functional outcome.    REHAB POTENTIAL: Good   CLINICAL DECISION MAKING: Evolving/moderate complexity   EVALUATION COMPLEXITY: Moderate     GOALS: Goals reviewed with patient? Yes   SHORT TERM GOALS: Target date: 05/17/2022    Pt will be independent with initial HEP. Baseline:  Goal status: INITIAL   2.  Pt will report at least a 30% improvement in condition since starting PT. Baseline:  Goal status: INITIAL     LONG TERM GOALS: Target date: 06/14/2022   Pt will be independent with advanced HEP. Baseline:  Goal status: INITIAL   2.  Pt will increase FOTO to at least 63% to demonstrate improvements in functional mobility. Baseline: 54% Goal status: INITIAL   3.  Pt will increase right upper extremity strength to at least 4+/5 to allow him to perform functional tasks with increased ease. Baseline: 4/5 Goal status: INITIAL   4.  Pt will report ability to ambulate for at least 30 minutes with no increase in pain to allow him to go out in the community or walk in neighborhood. Baseline:  Goal status: INITIAL   5.  Pt to demonstrate improved awareness of posture and body mechanics throughout PT session. Baseline:  Goal status: INITIAL       PLAN: PT FREQUENCY: 2x/week   PT DURATION: 8 weeks   PLANNED INTERVENTIONS: Therapeutic exercises, Therapeutic activity,  Neuromuscular re-education, Balance training, Gait training, Patient/Family education, Joint manipulation, Joint mobilization, Stair training, Aquatic Therapy, Dry Needling, Electrical stimulation, Spinal manipulation, Spinal mobilization,  Cryotherapy, Moist heat, Taping, Vasopneumatic device, Traction, Ultrasound, Ionotophoresis 81m/ml Dexamethasone, Manual therapy, and Re-evaluation   PLAN FOR NEXT SESSION: [facility] he was provided number to call to schedule follow up appointments.  Progress and assess HEP, strengthening, core stability, postural re-education    JLanice Shirts PT 04/24/2022, 3:43 PM

## 2022-04-24 ENCOUNTER — Ambulatory Visit: Payer: Medicare Other

## 2022-04-24 DIAGNOSIS — M6281 Muscle weakness (generalized): Secondary | ICD-10-CM

## 2022-04-24 DIAGNOSIS — M546 Pain in thoracic spine: Secondary | ICD-10-CM | POA: Diagnosis not present

## 2022-04-24 DIAGNOSIS — R262 Difficulty in walking, not elsewhere classified: Secondary | ICD-10-CM | POA: Diagnosis not present

## 2022-04-24 DIAGNOSIS — M542 Cervicalgia: Secondary | ICD-10-CM | POA: Diagnosis not present

## 2022-04-24 DIAGNOSIS — R293 Abnormal posture: Secondary | ICD-10-CM | POA: Diagnosis not present

## 2022-04-25 ENCOUNTER — Ambulatory Visit (INDEPENDENT_AMBULATORY_CARE_PROVIDER_SITE_OTHER): Payer: Medicare Other

## 2022-04-25 DIAGNOSIS — Z7901 Long term (current) use of anticoagulants: Secondary | ICD-10-CM

## 2022-04-25 DIAGNOSIS — Z5181 Encounter for therapeutic drug level monitoring: Secondary | ICD-10-CM | POA: Diagnosis not present

## 2022-04-25 DIAGNOSIS — I4891 Unspecified atrial fibrillation: Secondary | ICD-10-CM | POA: Diagnosis not present

## 2022-04-25 LAB — POCT INR: INR: 2.8 (ref 2.0–3.0)

## 2022-04-25 NOTE — Patient Instructions (Signed)
Description   Continue taking 5mg  daily.  Recheck INR in 4 weeks.  Call the Coumadin Clinic with any upcoming procedures or new medications.  [facility] [phone]

## 2022-05-09 DIAGNOSIS — M546 Pain in thoracic spine: Secondary | ICD-10-CM | POA: Diagnosis not present

## 2022-05-22 DIAGNOSIS — M546 Pain in thoracic spine: Secondary | ICD-10-CM | POA: Diagnosis not present

## 2022-05-23 ENCOUNTER — Ambulatory Visit (INDEPENDENT_AMBULATORY_CARE_PROVIDER_SITE_OTHER): Payer: Medicare Other | Admitting: *Deleted

## 2022-05-23 DIAGNOSIS — Z5181 Encounter for therapeutic drug level monitoring: Secondary | ICD-10-CM | POA: Diagnosis not present

## 2022-05-23 DIAGNOSIS — I4891 Unspecified atrial fibrillation: Secondary | ICD-10-CM

## 2022-05-23 LAB — POCT INR: INR: 2.3 (ref 2.0–3.0)

## 2022-05-23 NOTE — Patient Instructions (Signed)
Description   Continue taking 5mg  daily.  Recheck INR in 5 weeks.  Call the Coumadin Clinic with any upcoming procedures or new medications.  [facility] Clinic [phone]

## 2022-06-13 DIAGNOSIS — M5414 Radiculopathy, thoracic region: Secondary | ICD-10-CM | POA: Diagnosis not present

## 2022-06-13 DIAGNOSIS — M9902 Segmental and somatic dysfunction of thoracic region: Secondary | ICD-10-CM | POA: Diagnosis not present

## 2022-06-15 DIAGNOSIS — M5414 Radiculopathy, thoracic region: Secondary | ICD-10-CM | POA: Diagnosis not present

## 2022-06-15 DIAGNOSIS — M9902 Segmental and somatic dysfunction of thoracic region: Secondary | ICD-10-CM | POA: Diagnosis not present

## 2022-06-18 DIAGNOSIS — I4891 Unspecified atrial fibrillation: Secondary | ICD-10-CM | POA: Diagnosis not present

## 2022-06-18 DIAGNOSIS — G7000 Myasthenia gravis without (acute) exacerbation: Secondary | ICD-10-CM | POA: Diagnosis not present

## 2022-06-18 DIAGNOSIS — J449 Chronic obstructive pulmonary disease, unspecified: Secondary | ICD-10-CM | POA: Diagnosis not present

## 2022-06-18 DIAGNOSIS — D692 Other nonthrombocytopenic purpura: Secondary | ICD-10-CM | POA: Diagnosis not present

## 2022-06-20 ENCOUNTER — Encounter: Payer: Self-pay | Admitting: Student

## 2022-06-20 ENCOUNTER — Encounter: Payer: Self-pay | Admitting: Cardiology

## 2022-06-20 ENCOUNTER — Telehealth: Payer: Self-pay

## 2022-06-20 ENCOUNTER — Encounter: Payer: Self-pay | Admitting: Neurology

## 2022-06-20 DIAGNOSIS — M5414 Radiculopathy, thoracic region: Secondary | ICD-10-CM | POA: Diagnosis not present

## 2022-06-20 DIAGNOSIS — M9902 Segmental and somatic dysfunction of thoracic region: Secondary | ICD-10-CM | POA: Diagnosis not present

## 2022-06-20 NOTE — Telephone Encounter (Signed)
Opened in error

## 2022-06-21 ENCOUNTER — Encounter: Payer: Self-pay | Admitting: Dermatology

## 2022-06-22 DIAGNOSIS — R972 Elevated prostate specific antigen [PSA]: Secondary | ICD-10-CM | POA: Diagnosis not present

## 2022-06-25 MED ORDER — CLOBETASOL PROPIONATE 0.05 % EX OINT: 1.0000 | TOPICAL_OINTMENT | Freq: Two times a day (BID) | CUTANEOUS | 6 refills | Status: DC

## 2022-06-25 NOTE — Addendum Note (Signed)
Addended by: Sheran Lawless on: 06/25/2022 02:02 PM   Modules accepted: Orders

## 2022-06-28 ENCOUNTER — Ambulatory Visit: Payer: Medicare Other | Attending: Cardiology

## 2022-06-28 DIAGNOSIS — Z5181 Encounter for therapeutic drug level monitoring: Secondary | ICD-10-CM

## 2022-06-28 DIAGNOSIS — I4891 Unspecified atrial fibrillation: Secondary | ICD-10-CM | POA: Diagnosis not present

## 2022-06-28 DIAGNOSIS — Z7901 Long term (current) use of anticoagulants: Secondary | ICD-10-CM | POA: Diagnosis not present

## 2022-06-28 LAB — POCT INR: INR: 3 (ref 2.0–3.0)

## 2022-06-28 NOTE — Patient Instructions (Signed)
Description   Continue taking 5mg  daily.  Recheck INR in 6 weeks.  Call the Coumadin Clinic with any upcoming procedures or new medications.  [facility] Clinic [phone]

## 2022-06-29 DIAGNOSIS — M9902 Segmental and somatic dysfunction of thoracic region: Secondary | ICD-10-CM | POA: Diagnosis not present

## 2022-06-29 DIAGNOSIS — M5414 Radiculopathy, thoracic region: Secondary | ICD-10-CM | POA: Diagnosis not present

## 2022-07-04 DIAGNOSIS — M5414 Radiculopathy, thoracic region: Secondary | ICD-10-CM | POA: Diagnosis not present

## 2022-07-04 DIAGNOSIS — M9902 Segmental and somatic dysfunction of thoracic region: Secondary | ICD-10-CM | POA: Diagnosis not present

## 2022-07-06 DIAGNOSIS — M9902 Segmental and somatic dysfunction of thoracic region: Secondary | ICD-10-CM | POA: Diagnosis not present

## 2022-07-06 DIAGNOSIS — N281 Cyst of kidney, acquired: Secondary | ICD-10-CM | POA: Diagnosis not present

## 2022-07-06 DIAGNOSIS — R972 Elevated prostate specific antigen [PSA]: Secondary | ICD-10-CM | POA: Diagnosis not present

## 2022-07-06 DIAGNOSIS — R351 Nocturia: Secondary | ICD-10-CM | POA: Diagnosis not present

## 2022-07-06 DIAGNOSIS — N210 Calculus in bladder: Secondary | ICD-10-CM | POA: Diagnosis not present

## 2022-07-06 DIAGNOSIS — N401 Enlarged prostate with lower urinary tract symptoms: Secondary | ICD-10-CM | POA: Diagnosis not present

## 2022-07-06 DIAGNOSIS — M5414 Radiculopathy, thoracic region: Secondary | ICD-10-CM | POA: Diagnosis not present

## 2022-07-08 NOTE — Progress Notes (Unsigned)
Synopsis: Referred for chronic cough by [name], MD  Subjective:   PATIENT ID: [name] GENDER: male DOB: 10-30-1940, MRN: [id number]  No chief complaint on file.   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 Never has lived anywhere else other than [place], [place]. Pets: No pets. No birds. No feather pillows or down comforters.  Travel: none pertinent  Had ablation for some form of SVT vs AF about 6 years ago.   Interval HPI: He says he's had no hospitalizations since last visit. Had surveillance CT Chest ordered by PCP with stable RUL nodule. Coughs every night as he lies down for bed. But after he heads to sleep he's not coughing for the most part per his wife. Still has productive cough with greenish/whitish sputum. Has been taking his flonase, singulair, saline nasal spray. Has been off of symbicort for an unknown period of time.   Has mild DOE but he's unsure if the symbicort helped with it. He has no trouble at all with aspiration liquids or solids.   Back in November had covid-19. Congestion cleared up initially, worsening    Otherwise pertinent review of systems is negative.    Past Medical History:  Diagnosis Date   Abscess of sigmoid colon due to diverticulitis 12/26/2018   Appendicitis 09/05/2014   Basal cell carcinoma 10/04/2015   left deltoid-cx61fu   BCC (basal cell carcinoma) 11/03/2019   left shin (txpbx)   Colon polyp    Complication of anesthesia    Difficult to arouse, Combative x 1   COVID    Diverticulitis     Diverticulosis    extensive 3'15.   Dysrhythmia    hx. 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  Many polyps.  rec f/u colonoscopy summer 2020   EUS N/A 11/17/2014   Procedure: ESOPHAGEAL ENDOSCOPIC ULTRASOUND (EUS) RADIAL;  Surgeon: [name], MD;  [facility];  Service: Endoscopy;  Laterality: N/A;   FINE NEEDLE  ASPIRATION N/A 11/17/2014   Procedure: FINE NEEDLE ASPIRATION (FNA) LINEAR;  Surgeon: [name], MD;  [facility];  Service: Endoscopy;  Laterality: N/A;  + or- fna   FLEXIBLE SIGMOIDOSCOPY     HEMORRHOID SURGERY     banding    INGUINAL HERNIA REPAIR Bilateral 2005   '05 bilateral hernia   KNEE ARTHROSCOPY Left 2008   scope '08   LAPAROSCOPIC APPENDECTOMY N/A 09/05/2014   Procedure: APPENDECTOMY LAPAROSCOPIC;  Surgeon: [name], MD;  Location: [place];  Service: General;  Laterality: N/A;   PROCTOSCOPY N/A 12/26/2018   Procedure: RIGID PROCTOSCOPY;  Surgeon: [name], MD;  [facility];  Service: General;  Laterality: N/A;   ROTATOR CUFF REPAIR Right 2016    Social  History   Socioeconomic History   Marital status: Married    Spouse name: Not on file   Number of children: 3   Years of education: 14   Highest education level: Some college, no degree  Occupational History   Occupation: sales-Retired, works one day a week  Tobacco Use   Smoking status: Former    Packs/day: 1.00    Years: 47.00    Total pack years: 47.00    Types: Cigarettes    Quit date: 10/29/2000    Years since quitting: 21.7   Smokeless tobacco: Never  Vaping Use   Vaping Use: Never used  Substance and Sexual Activity   Alcohol use: Yes    Alcohol/week: 1.0 standard drink of alcohol    Types: 1 Standard drinks or equivalent per week    Comment: socially- nightly 1 cocktail   Drug use: No   Sexual activity: Not on file  Other Topics Concern   Not on file  Social History Narrative   Lives with wife in a one story home with a basement.  Has 3 daughters.  Works one day a week at the [employer].  Retired from Press photographer.  Education: some college.    Caffeine use: decaf   Right handed   One story home   Social Determinants of Health   Financial Resource Strain: Not on file  Food Insecurity: Not on file  Transportation Needs: Not on file  Physical Activity: Not on file  Stress: Not on file  Social Connections: Not on file  Intimate Partner Violence: Not on file     Allergies  Allergen Reactions   Ambrosia Trifida (Tall Ragweed) Allergy Skin Test Anaphylaxis   Bee Pollen Itching    Nasal congestion   Beta Adrenergic Blockers     Other reaction(s): MG   Ciprofloxacin     Other reaction(s): myasthenia gravis   Eliquis [Apixaban]     Throat tightening   Erythromycin     Other reaction(s): MG   Pollen Extract Other (See Comments)    Nasal congestion     Outpatient Medications Prior to Visit  Medication Sig Dispense Refill   albuterol (VENTOLIN HFA) 108 (90 Base) MCG/ACT inhaler Inhale 1-2 puffs into the lungs every 6 (six) hours as needed for wheezing or  shortness of breath. 18 g 5   atorvastatin (LIPITOR) 40 MG tablet Take 1 tablet (40 mg total) by mouth daily. 90 tablet 3   calcium carbonate (TUMS - DOSED IN MG ELEMENTAL CALCIUM) 500 MG chewable tablet Chew 500 mg by mouth daily.     clobetasol ointment (TEMOVATE) 5.00 % Apply 1 Application topically 2 (two) times daily. 60 g 6   Cyanocobalamin (VITAMIN B-12 PO) Take 5,000 mcg  by mouth daily.      doxazosin (CARDURA) 8 MG tablet Take 8 mg by mouth daily.     finasteride (PROSCAR) 5 MG tablet 5 mg daily.     fluticasone (FLONASE) 50 MCG/ACT nasal spray Place 2 sprays into both nostrils daily.     glucosamine-chondroitin 500-400 MG tablet Take 1 tablet by mouth daily.     ipratropium (ATROVENT) 0.03 % nasal spray 1 spray daily.     metoprolol succinate (TOPROL-XL) 50 MG 24 hr tablet Take 1 tablet (50 mg total) by mouth daily. Please keep upcoming appt for future refills. 90 tablet 3   montelukast (SINGULAIR) 10 MG tablet Take 10 mg by mouth daily.     Multiple Vitamin (MULTIVITAMIN WITH MINERALS) TABS tablet Take 1 tablet by mouth daily.     predniSONE (DELTASONE) 10 MG tablet Take 1 tablet (10 mg total) by mouth daily with breakfast. 90 tablet 3   pyridostigmine (MESTINON) 60 MG tablet Take 0.5 tablets (30 mg total) by mouth 3 (three) times daily. Take half tablet at 8am, 1pm, and 6pm 135 tablet 3   sodium chloride (OCEAN) 0.65 % SOLN nasal spray Place 1 spray into both nostrils daily as needed for congestion.     warfarin (COUMADIN) 5 MG tablet TAKE 1 TABLET (5 MG TOTAL) BY MOUTH DAILY. OR AS DIRECTED BY COUMADIN CLINIC _30 DAY SUPPLY 35 tablet 3   No facility-administered medications prior to visit.       Objective:   Physical Exam:  General appearance: 81 y.o., male, NAD, conversant  Eyes: anicteric sclerae, moist conjunctivae; no lid-lag; PERRL, tracking appropriately HENT: NCAT; oropharynx, MMM, no mucosal ulcerations; normal hard and soft palate Neck: Trachea midline; no  lymphadenopathy, no JVD Lungs: rhonchorous L>R/upper airway wheeze, no crackles, with normal respiratory effort CV: RRR, no MRGs  Abdomen: Soft, non-tender; non-distended, BS present  Extremities: No peripheral edema, radial and DP pulses present bilaterally  Skin: Normal temperature, turgor and texture; no rash Psych: Appropriate affect Neuro: Alert and oriented to person and place, no focal deficit    There were no vitals filed for this visit.     on RA BMI Readings from Last 3 Encounters:  01/16/22 27.37 kg/m  01/12/22 26.65 kg/m  11/03/21 26.45 kg/m   Wt Readings from Last 3 Encounters:  01/16/22 201 lb 12.8 oz (91.5 kg)  01/12/22 202 lb (91.6 kg)  11/03/21 195 lb (88.5 kg)     CBC    Component Value Date/Time   WBC 6.8 11/05/2021 0501   RBC 4.03 (L) 11/05/2021 0501   HGB 12.0 (L) 11/05/2021 0501   HGB 15.2 03/08/2016 1133   HGB 15.0 11/02/2014 1104   HCT 37.5 (L) 11/05/2021 0501   HCT 46.7 06/14/2018 1605   HCT 45.6 11/02/2014 1104   PLT 189 11/05/2021 0501   PLT 249 03/08/2016 1133   MCV 93.1 11/05/2021 0501   MCV 88 03/08/2016 1133   MCV 89.1 11/02/2014 1104   MCH 29.8 11/05/2021 0501   MCHC 32.0 11/05/2021 0501   RDW 14.3 11/05/2021 0501   RDW 14.0 03/08/2016 1133   RDW 13.9 11/02/2014 1104   LYMPHSABS 0.8 11/03/2021 1023   LYMPHSABS 1.2 11/02/2014 1104   MONOABS 0.9 11/03/2021 1023   MONOABS 0.5 11/02/2014 1104   EOSABS 0.1 11/03/2021 1023   EOSABS 0.2 11/02/2014 1104   BASOSABS 0.0 11/03/2021 1023   BASOSABS 0.0 11/02/2014 1104    Low level peripheral eosinophilia historically  Timothy grass, ragweed aeroallergen +  Chest Imaging: CTA Chest 04/2020 OSH report reviewed by me: no PE, +bilateral atelectasis, multiple hepatic hypodensities, small PCE   CT Chest 02/10/21: personally reviewed by me and remarkable for stable RUL gg nodule, lower lobe bronchiolitis R>L, secretions in RMB, RLL. Emphysema.  Pulmonary Functions Testing Results:     Latest Ref Rng & Units 07/18/2021   11:54 AM  PFT Results  FVC-Pre L 3.79   FVC-Predicted Pre % 89   FVC-Post L 4.04   FVC-Predicted Post % 95   Pre FEV1/FVC % % 62   Post FEV1/FCV % % 62   FEV1-Pre L 2.34   FEV1-Predicted Pre % 77   FEV1-Post L 2.50   DLCO uncorrected ml/min/mmHg 19.80   DLCO UNC% % 77   DLCO corrected ml/min/mmHg 19.80   DLCO COR %Predicted % 77   DLVA Predicted % 89   TLC L 7.18   TLC % Predicted % 97   RV % Predicted % 123    07/18/21 PFT Reviewed by me ratio 62, post BD FEV1 82%, very mildly reduced diffusing capacitiy  Reviewed [facility] PFTs: 01/2020 FEV1/FVC 54, post BD FEV1 82.8% without BD response.     Echocardiogram:   05/03/20 EFE:OFHQR PCE, age appropriate diastolic parameters     Assessment & Plan:   # chronic cough: has had trials of ppi/H2B. Probably multifactorial with COPD, UACS with persistent postnasal drip, possible component of LPR (despite negative trial of PPI) but would have expected more edematous appearing vocal cords/folds on laryngoscopy. Does additionally have bronchial wall thickening/borderline bronchiectasis and secretions in major airways, RLL changes that appear consistent with recurrent aspiration.   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 Walking oximetry today nadir of 89% with normal WOB.  # MG: MEP was just below LLN. TLC, FVC WNL 02/04/20. Periodic surveillance spiro.  # RUL GG pulmonary nodule: stable since 2015. No further need for surveillance.  Plan: - continue flonase and singulair for component of AR - flutter valve 10 slow but firm puffs after albuterol 1-2 puffs until gets new controller inhaler at which point he'll use controller inhaler followed by flutter valve  - we have talked extensively about value of pursuing MBS/speech eval. Although he's no longer having episodes of frank aspiration with better MG control, his most recent CT Chest is suggestive of either ongoing microaspiration or difficulty  with clearing secretions (weak cough) or both. He has declined speech eval.  - Since he's already on systemic steroid may be reasonable to try LABA or LABA/LAMA. Sent message to Dr. [name] to discuss addition of LAMA although there is no clear evidence that I could find that it could cause deterioration from [place], MD [facility] 07/08/2022 1:45 PM

## 2022-07-10 ENCOUNTER — Ambulatory Visit (INDEPENDENT_AMBULATORY_CARE_PROVIDER_SITE_OTHER): Payer: Medicare Other | Admitting: Student

## 2022-07-10 ENCOUNTER — Encounter: Payer: Self-pay | Admitting: Student

## 2022-07-10 VITALS — BP 118/70 | HR 80 | Ht 72.0 in | Wt 203.0 lb

## 2022-07-10 DIAGNOSIS — R053 Chronic cough: Secondary | ICD-10-CM | POA: Diagnosis not present

## 2022-07-10 DIAGNOSIS — J449 Chronic obstructive pulmonary disease, unspecified: Secondary | ICD-10-CM | POA: Diagnosis not present

## 2022-07-10 MED ORDER — BUDESONIDE-FORMOTEROL FUMARATE 80-4.5 MCG/ACT IN AERO: 2.0000 | INHALATION_SPRAY | Freq: Two times a day (BID) | RESPIRATORY_TRACT | 12 refills | Status: DC

## 2022-07-10 MED ORDER — AEROCHAMBER MV MISC: 0 refills | Status: DC

## 2022-07-10 NOTE — Patient Instructions (Addendum)
-   Would restart symbicort 2 puffs twice daily with spacer - can try to time one of your treatments 20 min before exercise if you prefer. Rinse mouth and gargle after use, rinse spacer at least once daily.  - Keep up the good work with the flonase, singulair, and aerobika flutter valve 10 slow but firm puffs once daily. Can use atrovent nasal spray as needed to dry up postnasal drainage but not on long term basis. - Take shower clear nose of crusting then use your flonase 1 spray each nostril  - See you in 6 months or sooner if you have any issues come up!

## 2022-07-11 DIAGNOSIS — M5414 Radiculopathy, thoracic region: Secondary | ICD-10-CM | POA: Diagnosis not present

## 2022-07-11 DIAGNOSIS — M9902 Segmental and somatic dysfunction of thoracic region: Secondary | ICD-10-CM | POA: Diagnosis not present

## 2022-07-13 NOTE — Progress Notes (Unsigned)
Follow-up Visit   Date: 07/16/22    [name] MRN: [id number] DOB: June 09, 1941   Interim History: [name] is a 81 y.o. right-handed Caucasian male with hyperlipidemia, COPD, peripheral neuropathy, SVT s/p ablation, afib on anticoagulation, BPH, former smoker, and diverticulitis returning to the clinic for follow-up of myasthenia gravis.  The patient was accompanied to the clinic by wife.   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  Hospitalized in Sept 2019, responsive to IVIG and prednisone 30mg /d.  Since this time, he has been on a tapering dose prednisone and tolerated it well.  He has been on prednisone 20mg  since 2020 with no further exacerbations.  No evidence of disease manifestation today.  - Taper prednisone slowly by 1mg /month.  He will reduce prednisone to 9mg  x 1 month, then 8mg /daily.  - If he has any breakthrough weakness during taper, increase back to 10mg  daily  - Continue mestinon 30mg  three daily.  OK to take extra 30mg  as needed for breakthrough symptoms.    2.  Multilevel lumbar spondylosis causing intermittent leg weakness  - Start PT for leg strengthening  Return to clinic 2 months  ---------------------------------------------------------------------- History of present illness: Patient was diagnosed with myasthenia gravis in August 2019 after presenting with left ptosis, facial weakness, double vision, and dysphagia. He went to the ER for these symptoms where stroke was excluded and AChR antibodies returned positive. He was started on mestinon 30mg  three times daily (8am, 4pm, and midnight).  He was started on prednisone 20mg , with no improvement.  He was hospitalized from 9/19 - 07/23/2018 with MG exacerbated and treated with IVIG with great response. His prednisone was increased to 30mg  daily and he was started on IVIG, which helped control MG symptoms. During 2020, his  prednisone was tapered and he has been on prednisone 10mg  daily.    He also complains of severe neck pain which is worse with prolonged standing and neck extension.  He usually has to sit back down and rest for relief. He does not have numbness/tingling of the hands or weakness.    He has previously been evaluated for neuropathy at [facility] and [place] with NCS/EMG which showed peripheral sensorimotor axonal neuropathy.  CSF testing was normal and genetic testing was normal.    UPDATE 05/25/2021:  He is here for follow-up visit.  He was at the beach last week and did well in the hot temperatures.  He denies any double vision, droopy eyelids, difficulty talking or swallowing, or limb weakness.  His wife states he has rare spells of difficulty swallowing, but also feels he may not be chewing his food.  He is compliant with prednisone 10mg  daily and mestinon 30mg  three times daily. He rarely needs to take an extra mestinon.  He is seeing Dr. Jacelyn Grip for arthritic pain in the hip and back and getting injections.   UPDATE 01/12/2022:   He is here for follow-up visit.  His myasthenia gravis has been doing well since his last visit. No issues with vision, swallow/speech, or weakness.  Seldom his eye may get droopy, but it quickly resolves. He was hospitalized in January for diverticulitis.  He remains complaint with prednisone 10mg  daily and mestinon 30mg  TID.  No new complaints. Wife reports that patient does have a lot of anxiety over his health, and tries to reassure him.  UPDATE 07/16/2022:  He is here for follow-up visit.  His myasthenia gravis has been doing great.  No  issues with vision, swallowing, or weakness.  When tired, he gets a droopiness of the eyelids.   He endorses some leg weakness with trying to stand after a long time or with walking.  He switched from [place] to coumadin due to cost for atrial fibrillation.  He will be celebrating his 27th wedding anniversary next month.   Medications:   Current Outpatient Medications on File Prior to Visit  Medication Sig Dispense Refill   albuterol (VENTOLIN HFA) 108 (90 Base) MCG/ACT inhaler Inhale 1-2 puffs into the lungs every 6 (six) hours as needed for wheezing or shortness of breath. 18 g 5   atorvastatin (LIPITOR) 40 MG tablet Take 1 tablet (40 mg total) by mouth daily. 90 tablet 3   budesonide-formoterol (SYMBICORT) 80-4.5 MCG/ACT inhaler Inhale 2 puffs into the lungs in the morning and at bedtime. 1 each 12   calcium carbonate (TUMS - DOSED IN MG ELEMENTAL CALCIUM) 500 MG chewable tablet Chew 500 mg by mouth daily.     Cyanocobalamin (VITAMIN B-12 PO) Take 5,000 mcg by mouth daily.      doxazosin (CARDURA) 8 MG tablet Take 8 mg by mouth daily.     finasteride (PROSCAR) 5 MG tablet 5 mg daily.     fluticasone (FLONASE) 50 MCG/ACT nasal spray Place 2 sprays into both nostrils daily.     glucosamine-chondroitin 500-400 MG tablet Take 1 tablet by mouth daily.     ipratropium (ATROVENT) 0.03 % nasal spray 1 spray daily.     metoprolol succinate (TOPROL-XL) 50 MG 24 hr tablet Take 1 tablet (50 mg total) by mouth daily. Please keep upcoming appt for future refills. 90 tablet 3   montelukast (SINGULAIR) 10 MG tablet Take 10 mg by mouth daily.     Multiple Vitamin (MULTIVITAMIN WITH MINERALS) TABS tablet Take 1 tablet by mouth daily.     predniSONE (DELTASONE) 10 MG tablet Take 1 tablet (10 mg total) by mouth daily with breakfast. 90 tablet 3   pyridostigmine (MESTINON) 60 MG tablet Take 0.5 tablets (30 mg total) by mouth 3 (three) times daily. Take half tablet at 8am, 1pm, and 6pm 135 tablet 3   sodium chloride (OCEAN) 0.65 % SOLN nasal spray Place 1 spray into both nostrils daily as needed for congestion.     Spacer/Aero-Holding Chambers (AEROCHAMBER MV) inhaler Use as instructed 1 each 0   warfarin (COUMADIN) 5 MG tablet TAKE 1 TABLET (5 MG TOTAL) BY MOUTH DAILY. OR AS DIRECTED BY COUMADIN CLINIC _30 DAY SUPPLY 35 tablet 3   No current  facility-administered medications on file prior to visit.    Allergies:  Allergies  Allergen Reactions   Ambrosia Trifida (Tall Ragweed) Allergy Skin Test Anaphylaxis   Bee Pollen Itching    Nasal congestion   Beta Adrenergic Blockers     Other reaction(s): MG   Ciprofloxacin     Other reaction(s): myasthenia gravis   Eliquis [Apixaban]     Throat tightening   Erythromycin     Other reaction(s): MG   Pollen Extract Other (See Comments)    Nasal congestion    Vital Signs:  BP 120/73   Pulse 76   Ht 6' (1.829 m)   Wt 201 lb (91.2 kg)   SpO2 94%   BMI 27.26 kg/m    Neurological Exam: MENTAL STATUS including orientation to time, place, person, recent and remote memory, attention span and concentration, language, and fund of knowledge is normal.  Speech is not dysarthric.  CRANIAL NERVES:  Normal conjugate, extra-ocular eye movements in all directions of gaze.  Mild ptosis (stable)  Face is symmetric.  Orbicularis oculi, buccinator, and orbicularis oris is 5/5.  Tongue strength is 5/5  MOTOR:  Motor strength is 5/5 in all extremities.  Neck flexion is 5/5.   REFLEXES:  Reflexes are 2+/4 throughout, except absent at the ankles.   COORDINATION/GAIT:    Gait narrow based, stooped, mildly antalgic appearing. Unassisted, stable.   Data: Labs 06/15/2018:  AChR binding 1.69*, MUSK neg   MRI brain wo contrast 06/14/2018: Negative for acute infarct. 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   CTA head and neck 06/14/2018: 1. Negative for large vessel occlusion, dissection, or arterial stenosis in the head or neck. There is minimal atherosclerosis. 2. Positive for generalized arterial tortuosity. Ectatic distal aortic arch and proximal descending aorta. 3. A small 15 mm area of ground-glass opacity in the right upper lobe appears stable since 2015. 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    NCS/EMG of the legs 03/13/2018:  This is an abnormal study. 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   CT chest 07/11/2018:  Negative for thymoma  MRI cervical spine wo contrast 07/14/2018: 1. No acute finding. 2. Disc and facet degeneration causes foraminal impingement that is advanced on the left at c3-4 and on the left at C5-6. 3. Mild right foraminal narrowing at C5-6 and C6-7.    Thank you for allowing me to participate in patient's care.  If I can answer any additional questions, I would be pleased to do so.    Sincerely,    Trentan Trippe K. [name], DO

## 2022-07-16 ENCOUNTER — Ambulatory Visit (INDEPENDENT_AMBULATORY_CARE_PROVIDER_SITE_OTHER): Payer: Medicare Other | Admitting: Neurology

## 2022-07-16 ENCOUNTER — Encounter: Payer: Self-pay | Admitting: Neurology

## 2022-07-16 VITALS — BP 120/73 | HR 76 | Ht 72.0 in | Wt 201.0 lb

## 2022-07-16 DIAGNOSIS — M4306 Spondylolysis, lumbar region: Secondary | ICD-10-CM

## 2022-07-16 DIAGNOSIS — G7000 Myasthenia gravis without (acute) exacerbation: Secondary | ICD-10-CM | POA: Diagnosis not present

## 2022-07-16 MED ORDER — PREDNISONE 1 MG PO TABS: ORAL_TABLET | ORAL | 3 refills | Status: DC

## 2022-07-16 NOTE — Patient Instructions (Signed)
Take total of 9mg  (1mg  x 4 + 5mg ) for one month, then reduce to 8mg  daily.    If you develop new droopiness of the eyelids, double vision, difficulty swallowing/talking, go back to taking prednisone 10mg .  Return to clinic in 2 months

## 2022-07-18 DIAGNOSIS — M9902 Segmental and somatic dysfunction of thoracic region: Secondary | ICD-10-CM | POA: Diagnosis not present

## 2022-07-18 DIAGNOSIS — M5414 Radiculopathy, thoracic region: Secondary | ICD-10-CM | POA: Diagnosis not present

## 2022-07-20 DIAGNOSIS — M5414 Radiculopathy, thoracic region: Secondary | ICD-10-CM | POA: Diagnosis not present

## 2022-07-20 DIAGNOSIS — M9902 Segmental and somatic dysfunction of thoracic region: Secondary | ICD-10-CM | POA: Diagnosis not present

## 2022-07-25 DIAGNOSIS — M5414 Radiculopathy, thoracic region: Secondary | ICD-10-CM | POA: Diagnosis not present

## 2022-07-25 DIAGNOSIS — M9902 Segmental and somatic dysfunction of thoracic region: Secondary | ICD-10-CM | POA: Diagnosis not present

## 2022-07-27 DIAGNOSIS — M9902 Segmental and somatic dysfunction of thoracic region: Secondary | ICD-10-CM | POA: Diagnosis not present

## 2022-07-27 DIAGNOSIS — M5414 Radiculopathy, thoracic region: Secondary | ICD-10-CM | POA: Diagnosis not present

## 2022-08-01 DIAGNOSIS — M5414 Radiculopathy, thoracic region: Secondary | ICD-10-CM | POA: Diagnosis not present

## 2022-08-01 DIAGNOSIS — M9902 Segmental and somatic dysfunction of thoracic region: Secondary | ICD-10-CM | POA: Diagnosis not present

## 2022-08-02 ENCOUNTER — Ambulatory Visit (INDEPENDENT_AMBULATORY_CARE_PROVIDER_SITE_OTHER): Payer: Medicare Other | Admitting: Podiatry

## 2022-08-02 ENCOUNTER — Ambulatory Visit (INDEPENDENT_AMBULATORY_CARE_PROVIDER_SITE_OTHER): Payer: Medicare Other

## 2022-08-02 DIAGNOSIS — L600 Ingrowing nail: Secondary | ICD-10-CM | POA: Diagnosis not present

## 2022-08-02 DIAGNOSIS — M778 Other enthesopathies, not elsewhere classified: Secondary | ICD-10-CM

## 2022-08-02 MED ORDER — NEOMYCIN-POLYMYXIN-HC 3.5-10000-1 OT SOLN: OTIC | 0 refills | Status: DC

## 2022-08-02 NOTE — Patient Instructions (Signed)

## 2022-08-02 NOTE — Progress Notes (Signed)
Subjective:  Patient ID: [name], male    DOB: 02-10-1941,  MRN: [id number] HPI Chief Complaint  Patient presents with   Foot Problem    bil foot pain/ right great toe sensitive to touch    81 y.o. male presents with the above complaint.   ROS: Denies fever chills nausea vomiting muscle aches pains calf pain back pain chest pain shortness of breath.  Past Medical History:  Diagnosis Date   Abscess of sigmoid colon due to diverticulitis 12/26/2018   Appendicitis 09/05/2014   Basal cell carcinoma 10/04/2015   left [id number]   BCC (basal cell carcinoma) 11/03/2019   left shin (txpbx)   Colon polyp    Complication of anesthesia    Difficult to arouse, Combative x 1   COVID    Diverticulitis    Diverticulosis    extensive 3'15.   Dysrhythmia    hx. 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  Many polyps.  rec f/u colonoscopy summer 2020   EUS N/A 11/17/2014   Procedure: ESOPHAGEAL ENDOSCOPIC ULTRASOUND (EUS) RADIAL;  Surgeon: [name], MD;  Location: [facility];  Service: Endoscopy;  Laterality: N/A;   FINE NEEDLE ASPIRATION N/A 11/17/2014   Procedure: FINE NEEDLE ASPIRATION (FNA) LINEAR;  Surgeon: [name], MD;  Location: [facility];  Service:  Endoscopy;  Laterality: N/A;  + or- fna   FLEXIBLE SIGMOIDOSCOPY     HEMORRHOID SURGERY     banding    INGUINAL HERNIA REPAIR Bilateral 2005   '05 bilateral hernia   KNEE ARTHROSCOPY Left 2008   scope '08   LAPAROSCOPIC APPENDECTOMY N/A 09/05/2014   Procedure: APPENDECTOMY LAPAROSCOPIC;  Surgeon: [name], MD;  Location: [facility];  Service: General;  Laterality: N/A;   PROCTOSCOPY N/A 12/26/2018   Procedure: RIGID PROCTOSCOPY;  Surgeon: [name], MD;  Location: [facility];  Service: General;  Laterality: N/A;   ROTATOR CUFF REPAIR Right 2016    Current Outpatient Medications:    neomycin-polymyxin-hydrocortisone (CORTISPORIN) OTIC solution, Apply one to two drops to toe after soaking twice daily., Disp: 10 mL, Rfl: 0   albuterol (VENTOLIN HFA) 108 (90 Base) MCG/ACT inhaler, Inhale 1-2 puffs into the lungs every 6 (six) hours as needed for wheezing or shortness of breath., Disp: 18 g, Rfl: 5   atorvastatin (LIPITOR) 40 MG tablet, Take 1 tablet (40 mg total) by mouth daily., Disp: 90 tablet, Rfl: 3   budesonide-formoterol (SYMBICORT) 80-4.5 MCG/ACT inhaler, Inhale 2 puffs into the lungs in the morning and at bedtime., Disp: 1 each, Rfl: 12   calcium carbonate (TUMS - DOSED IN MG ELEMENTAL CALCIUM) 500 MG  chewable tablet, Chew 500 mg by mouth daily., Disp: , Rfl:    Cyanocobalamin (VITAMIN B-12 PO), Take 5,000 mcg by mouth daily. , Disp: , Rfl:    doxazosin (CARDURA) 8 MG tablet, Take 8 mg by mouth daily., Disp: , Rfl:    finasteride (PROSCAR) 5 MG tablet, 5 mg daily., Disp: , Rfl:    fluticasone (FLONASE) 50 MCG/ACT nasal spray, Place 2 sprays into both nostrils daily., Disp: , Rfl:    glucosamine-chondroitin 500-400 MG tablet, Take 1 tablet by mouth daily., Disp: , Rfl:    ipratropium (ATROVENT) 0.03 % nasal spray, 1 spray daily., Disp: , Rfl:    metoprolol succinate (TOPROL-XL) 50 MG 24 hr tablet, Take 1 tablet (50 mg total) by mouth daily. Please keep upcoming appt for future  refills., Disp: 90 tablet, Rfl: 3   montelukast (SINGULAIR) 10 MG tablet, Take 10 mg by mouth daily., Disp: , Rfl:    Multiple Vitamin (MULTIVITAMIN WITH MINERALS) TABS tablet, Take 1 tablet by mouth daily., Disp: , Rfl:    predniSONE (DELTASONE) 1 MG tablet, Take total of 9mg  (1mg  x 4 + 5mg ) for one month, then reduce to 8mg  daily., Disp: 120 tablet, Rfl: 3   predniSONE (DELTASONE) 10 MG tablet, Take 1 tablet (10 mg total) by mouth daily with breakfast., Disp: 90 tablet, Rfl: 3   pyridostigmine (MESTINON) 60 MG tablet, Take 0.5 tablets (30 mg total) by mouth 3 (three) times daily. Take half tablet at 8am, 1pm, and 6pm, Disp: 135 tablet, Rfl: 3   sodium chloride (OCEAN) 0.65 % SOLN nasal spray, Place 1 spray into both nostrils daily as needed for congestion., Disp: , Rfl:    Spacer/Aero-Holding Chambers (AEROCHAMBER MV) inhaler, Use as instructed, Disp: 1 each, Rfl: 0   warfarin (COUMADIN) 5 MG tablet, TAKE 1 TABLET (5 MG TOTAL) BY MOUTH DAILY. OR AS DIRECTED BY COUMADIN CLINIC _30 DAY SUPPLY, Disp: 35 tablet, Rfl: 3  Allergies  Allergen Reactions   Ambrosia Trifida (Tall Ragweed) Allergy Skin Test Anaphylaxis   Bee Pollen Itching    Nasal congestion   Beta Adrenergic Blockers     Other reaction(s): MG   Ciprofloxacin     Other reaction(s): myasthenia gravis   Eliquis [Apixaban]     Throat tightening   Erythromycin     Other reaction(s): MG   Pollen Extract Other (See Comments)    Nasal congestion   Review of Systems Objective:  There were no vitals filed for this visit.  General: Well developed, nourished, in no acute distress, alert and oriented x3   Dermatological: Skin is warm, dry and supple bilateral. Nails x 10 are well maintained; remaining integument appears unremarkable at this time. There are no open sores, no preulcerative lesions, no rash or signs of infection present.  Sharp incurvated nail margin fibular border hallux right.  Vascular: Dorsalis Pedis artery and  Posterior Tibial artery pedal pulses are 2/4 bilateral with immedate capillary fill time. Pedal hair growth present. No varicosities and no lower extremity edema present bilateral.   Neruologic: Grossly intact via light touch bilateral. Vibratory intact via tuning fork bilateral. Protective threshold with Semmes Wienstein monofilament intact to all pedal sites bilateral. Patellar and Achilles deep tendon reflexes 2+ bilateral. No Babinski or clonus noted bilateral.  Sounds like early neuropathic changes with the tightness around the midfoot and radiating pain from medial to lateral across the plantar aspect of the first and lesser metatarsal phalangeal joints of the right foot.  States that sometimes  it happens at nighttime sometimes during the day while ambulating.  Musculoskeletal: No gross boney pedal deformities bilateral. No pain, crepitus, or limitation noted with foot and ankle range of motion bilateral. Muscular strength 5/5 in all groups tested bilateral.  Gait: Unassisted, Nonantalgic.    Radiographs:  Radiographs taken today demonstrate an osseously mature individual significant osteopenia.  There is significant osteoarthritis to all of the digits and the midfoot bilaterally.  Assessment & Plan:   Assessment: Contusion left foot no fractures identified.  Right foot ingrown toenail hallux right.  Osteoarthritis bilateral foot.  Neuropathy bilateral foot.  Plan: Discussed etiology pathology conservative versus surgical therapies.  At this point we consented him for matrixectomy hallux right fibular border.  This was performed after local anesthetic was administered he tolerated procedure well without complications.  Was given both oral written home-going instructions for care and soaking of the toe as well as a prescription for Cortisporin Otic to be applied twice daily after soaking.  I will follow-up with him for this in 2 weeks.  Also wrote down the name of Voltaren gel for the  arthritis in the top of the foot bilaterally.     Akin Yi T. [place], [place]

## 2022-08-03 ENCOUNTER — Encounter: Payer: Self-pay | Admitting: Student

## 2022-08-03 MED ORDER — IPRATROPIUM BROMIDE 0.03 % NA SOLN: 1.0000 | Freq: Every day | NASAL | 2 refills | Status: DC | PRN

## 2022-08-08 ENCOUNTER — Ambulatory Visit: Payer: Medicare Other | Attending: Cardiology | Admitting: Student

## 2022-08-08 DIAGNOSIS — Z7901 Long term (current) use of anticoagulants: Secondary | ICD-10-CM

## 2022-08-08 DIAGNOSIS — Z5181 Encounter for therapeutic drug level monitoring: Secondary | ICD-10-CM | POA: Diagnosis not present

## 2022-08-08 DIAGNOSIS — I4891 Unspecified atrial fibrillation: Secondary | ICD-10-CM | POA: Diagnosis not present

## 2022-08-08 LAB — POCT INR: INR: 3.2 — AB (ref 2.0–3.0)

## 2022-08-08 NOTE — Patient Instructions (Signed)
Take 1/2 tablet (2.5 mg) today then Continue taking 1 tablet (5mg ) daily.  Recheck INR in 4 weeks.  Call the Coumadin Clinic with any upcoming procedures or new medications.  Coumadin Clinic [phone]

## 2022-08-20 DIAGNOSIS — Z23 Encounter for immunization: Secondary | ICD-10-CM | POA: Diagnosis not present

## 2022-08-22 ENCOUNTER — Other Ambulatory Visit: Payer: Self-pay | Admitting: Cardiology

## 2022-08-22 DIAGNOSIS — I4891 Unspecified atrial fibrillation: Secondary | ICD-10-CM

## 2022-08-22 NOTE — Telephone Encounter (Signed)
Refill request for Warfarin:  Last INR was 3.2 on 08/08/22 Next INR due on 09/06/22 LOV was 05/29/21  Refill approved x 1.  Message sent to schedulers to make 1 yr f/u appt with Dr Marlou Porch.

## 2022-08-28 DIAGNOSIS — H26493 Other secondary cataract, bilateral: Secondary | ICD-10-CM | POA: Diagnosis not present

## 2022-08-28 DIAGNOSIS — H0102B Squamous blepharitis left eye, upper and lower eyelids: Secondary | ICD-10-CM | POA: Diagnosis not present

## 2022-08-28 DIAGNOSIS — H04123 Dry eye syndrome of bilateral lacrimal glands: Secondary | ICD-10-CM | POA: Diagnosis not present

## 2022-08-28 DIAGNOSIS — H35413 Lattice degeneration of retina, bilateral: Secondary | ICD-10-CM | POA: Diagnosis not present

## 2022-08-28 DIAGNOSIS — H0102A Squamous blepharitis right eye, upper and lower eyelids: Secondary | ICD-10-CM | POA: Diagnosis not present

## 2022-08-28 DIAGNOSIS — H43813 Vitreous degeneration, bilateral: Secondary | ICD-10-CM | POA: Diagnosis not present

## 2022-08-30 ENCOUNTER — Ambulatory Visit (INDEPENDENT_AMBULATORY_CARE_PROVIDER_SITE_OTHER): Payer: Medicare Other | Admitting: Podiatry

## 2022-08-30 ENCOUNTER — Encounter: Payer: Self-pay | Admitting: Podiatry

## 2022-08-30 DIAGNOSIS — G5781 Other specified mononeuropathies of right lower limb: Secondary | ICD-10-CM

## 2022-08-30 DIAGNOSIS — L600 Ingrowing nail: Secondary | ICD-10-CM

## 2022-08-30 DIAGNOSIS — G5782 Other specified mononeuropathies of left lower limb: Secondary | ICD-10-CM

## 2022-08-30 DIAGNOSIS — Z9889 Other specified postprocedural states: Secondary | ICD-10-CM | POA: Diagnosis not present

## 2022-08-30 MED ORDER — TRIAMCINOLONE ACETONIDE 40 MG/ML IJ SUSP
40.0000 mg | Freq: Once | INTRAMUSCULAR | Status: AC
  Administered 2022-08-30: 40 mg

## 2022-08-30 NOTE — Progress Notes (Signed)
He presents today for follow-up of his matricectomy hallux right fibular border states that is doing just great has not hurt at all.  He states that he is having pain along the forefoot particularly in the third and fourth toes of the right foot.  Objective: Vital signs are stable he is alert and oriented x3 pulses are palpable.  Surgical site appears to be healing very nicely there is no erythema edema cellulitis drainage or odor.  He does have pain on palpation third intermetatarsal space with a palpable Mulder's click bilaterally.  This is consistent with neuroma.  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  Well-healing surgical toe hallux right.  Plan: Continue soaking Epsom salts and warm water at least daily for the next week and apply Cortisporin otic as directed.  I injected the third interdigital space bilateral with 10 mg Kenalog 5 mg Marcaine.  Tolerated procedure well without complications.  Follow-up with him in 6 weeks for evaluation of the neuromas.

## 2022-09-04 DIAGNOSIS — H26493 Other secondary cataract, bilateral: Secondary | ICD-10-CM | POA: Diagnosis not present

## 2022-09-06 ENCOUNTER — Ambulatory Visit: Payer: Medicare Other | Attending: Cardiovascular Disease

## 2022-09-06 DIAGNOSIS — Z5181 Encounter for therapeutic drug level monitoring: Secondary | ICD-10-CM | POA: Diagnosis not present

## 2022-09-06 DIAGNOSIS — Z7901 Long term (current) use of anticoagulants: Secondary | ICD-10-CM | POA: Diagnosis not present

## 2022-09-06 DIAGNOSIS — I4891 Unspecified atrial fibrillation: Secondary | ICD-10-CM

## 2022-09-06 LAB — POCT INR: INR: 2.2 (ref 2.0–3.0)

## 2022-09-06 NOTE — Patient Instructions (Signed)
Description   Continue taking 1 tablet (5mg ) daily.  Recheck INR in 5 weeks.  Call the Coumadin Clinic with any upcoming procedures or new medications.  C[facility]madin Clinic [phone]

## 2022-09-12 ENCOUNTER — Other Ambulatory Visit: Payer: Self-pay | Admitting: Cardiology

## 2022-09-17 ENCOUNTER — Encounter: Payer: Self-pay | Admitting: Neurology

## 2022-09-17 ENCOUNTER — Other Ambulatory Visit: Payer: Self-pay | Admitting: Cardiology

## 2022-09-17 ENCOUNTER — Ambulatory Visit (INDEPENDENT_AMBULATORY_CARE_PROVIDER_SITE_OTHER): Payer: Medicare Other | Admitting: Neurology

## 2022-09-17 VITALS — BP 121/72 | HR 63 | Ht 72.0 in | Wt 204.0 lb

## 2022-09-17 DIAGNOSIS — G7000 Myasthenia gravis without (acute) exacerbation: Secondary | ICD-10-CM

## 2022-09-17 DIAGNOSIS — I4891 Unspecified atrial fibrillation: Secondary | ICD-10-CM

## 2022-09-17 NOTE — Progress Notes (Signed)
Follow-up Visit   Date: 09/17/22    [name] MRN: [id number] DOB: [birth year]   Interim History: [name] is a 81 y.o. right-handed Caucasian male with hyperlipidemia, COPD, peripheral neuropathy, SVT s/p ablation, afib on anticoagulation, BPH, former smoker, and diverticulitis returning to the clinic for follow-up of myasthenia gravis.  The patient was accompanied to the clinic by wife.   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  Hospitalized in Sept 2019, responsive to IVIG and prednisone 30mg /d.  Since this time, he has been on a tapering dose prednisone and tolerated it well.  He has been on prednisone 20mg  since 2020 with no further exacerbations.  No evidence of disease manifestation - Continue slow prednisone taper with 8mg /d for November and December, then reduce to 7mg /d x 2 months  - If he has any breakthrough weakness during taper, increase back to 10mg  daily  - Continue mestinon 30mg  three daily.  OK to take extra 30mg  as needed for breakthrough symptoms.    2.  Multilevel lumbar spondylosis causing intermittent leg weakness, unchanged  - Completed PT with no marked benefit  Return to clinic in 3 months  ---------------------------------------------------------------------- History of present illness: Patient was diagnosed with myasthenia gravis in August 2019 after presenting with left ptosis, facial weakness, double vision, and dysphagia. He went to the ER for these symptoms where stroke was excluded and AChR antibodies returned positive. He was started on mestinon 30mg  three times daily (8am, 4pm, and midnight).  He was started on prednisone 20mg , with no improvement.  He was hospitalized from 9/19 - 07/23/2018 with MG exacerbated and treated with IVIG with great response. His prednisone was increased to 30mg  daily and he was started on IVIG, which helped control MG symptoms. During  2020, his prednisone was tapered and he has been on prednisone 10mg  daily.    He also complains of severe neck pain which is worse with prolonged standing and neck extension.  He usually has to sit back down and rest for relief. He does not have numbness/tingling of the hands or weakness.    He has previously been evaluated for neuropathy at [facility] and [place] with NCS/EMG which showed peripheral sensorimotor axonal neuropathy.  CSF testing was normal and genetic testing was normal.    UPDATE 05/25/2021:   He was at the beach last week and did well in the hot temperatures.  He denies any double vision, droopy eyelids, difficulty talking or swallowing, or limb weakness.  His wife states he has rare spells of difficulty swallowing, but also feels he may not be chewing his food.  He is compliant with prednisone 10mg  daily and mestinon 30mg  three times daily.  UPDATE 01/12/2022:   He was hospitalized in January for diverticulitis.  He remains complaint with prednisone 10mg  daily and mestinon 30mg  TID.    UPDATE 07/16/2022:  He is here for follow-up visit.  His myasthenia gravis has been doing great.   When tired, he gets a droopiness of the eyelids.   He endorses some leg weakness with trying to stand after a long time or with walking.  He switched from [place] to coumadin due to cost for atrial fibrillation.  He will be celebrating his 83th wedding anniversary next month.   UPDATE 09/17/2022: He is here for follow-up visit.  He is on prednisone 8mg  since the beginning of the month and denies any new weakness, droopy eyelids, or double vision. No new complaints.  Medications:  Current Outpatient Medications on File Prior to Visit  Medication Sig Dispense Refill   albuterol (VENTOLIN HFA) 108 (90 Base) MCG/ACT inhaler Inhale 1-2 puffs into the lungs every 6 (six) hours as needed for wheezing or shortness of breath. 18 g 5   atorvastatin (LIPITOR) 40 MG tablet TAKE 1 TABLET BY MOUTH EVERY DAY 90  tablet 3   budesonide-formoterol (SYMBICORT) 80-4.5 MCG/ACT inhaler Inhale 2 puffs into the lungs in the morning and at bedtime. 1 each 12   calcium carbonate (TUMS - DOSED IN MG ELEMENTAL CALCIUM) 500 MG chewable tablet Chew 500 mg by mouth daily.     Cyanocobalamin (VITAMIN B-12 PO) Take 5,000 mcg by mouth daily.      doxazosin (CARDURA) 8 MG tablet Take 8 mg by mouth daily.     finasteride (PROSCAR) 5 MG tablet 5 mg daily.     fluticasone (FLONASE) 50 MCG/ACT nasal spray Place 2 sprays into both nostrils daily.     glucosamine-chondroitin 500-400 MG tablet Take 1 tablet by mouth daily.     ipratropium (ATROVENT) 0.03 % nasal spray Place 1 spray into both nostrils daily as needed for rhinitis. 30 mL 2   metoprolol succinate (TOPROL-XL) 50 MG 24 hr tablet Take 1 tablet (50 mg total) by mouth daily. Please keep upcoming appt for future refills. 90 tablet 3   montelukast (SINGULAIR) 10 MG tablet Take 10 mg by mouth daily.     Multiple Vitamin (MULTIVITAMIN WITH MINERALS) TABS tablet Take 1 tablet by mouth daily.     neomycin-polymyxin-hydrocortisone (CORTISPORIN) OTIC solution Apply one to two drops to toe after soaking twice daily. 10 mL 0   predniSONE (DELTASONE) 1 MG tablet Take total of 9mg  (1mg  x 4 + 5mg ) for one month, then reduce to 8mg  daily. (Patient taking differently: Take 8mg  daily.) 120 tablet 3   predniSONE (DELTASONE) 10 MG tablet Take 1 tablet (10 mg total) by mouth daily with breakfast. 90 tablet 3   pyridostigmine (MESTINON) 60 MG tablet Take 0.5 tablets (30 mg total) by mouth 3 (three) times daily. Take half tablet at 8am, 1pm, and 6pm 135 tablet 3   sodium chloride (OCEAN) 0.65 % SOLN nasal spray Place 1 spray into both nostrils daily as needed for congestion.     Spacer/Aero-Holding Chambers (AEROCHAMBER MV) inhaler Use as instructed 1 each 0   No current facility-administered medications on file prior to visit.    Allergies:  Allergies  Allergen Reactions   Ambrosia  Trifida (Tall Ragweed) Allergy Skin Test Anaphylaxis   Bee Pollen Itching    Nasal congestion   Beta Adrenergic Blockers     Other reaction(s): MG   Ciprofloxacin     Other reaction(s): myasthenia gravis   Eliquis [Apixaban]     Throat tightening   Erythromycin     Other reaction(s): MG   Pollen Extract Other (See Comments)    Nasal congestion    Vital Signs:  BP 121/72   Pulse 63   Ht 6' (1.829 m)   Wt 204 lb (92.5 kg)   SpO2 94%   BMI 27.67 kg/m    Neurological Exam: MENTAL STATUS including orientation to time, place, person, recent and remote memory, attention span and concentration, language, and fund of knowledge is normal.  Speech is not dysarthric.  CRANIAL NERVES:   Normal conjugate, extra-ocular eye movements in all directions of gaze.  Mild ptosis (stable)  Face is symmetric.  Orbicularis oculi, buccinator, and orbicularis oris is 5/5.  Tongue strength is 5/5  MOTOR:  Motor strength is 5/5 in all extremities.  Neck flexion is 5/5.   REFLEXES:  Reflexes are 2+/4 throughout, except absent at the ankles.   COORDINATION/GAIT:    Gait narrow based, stooped, mildly antalgic appearing. Unassisted, stable.   Data: Labs 06/15/2018:  AChR binding 1.69*, MUSK neg   MRI brain wo contrast 06/14/2018: Negative for acute infarct. 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   CTA head and neck 06/14/2018: 1. Negative for large vessel occlusion, dissection, or arterial stenosis in the head or neck. There is minimal atherosclerosis. 2. Positive for generalized arterial tortuosity. Ectatic distal aortic arch and proximal descending aorta. 3. A small 15 mm area of ground-glass opacity in the right upper lobe appears stable since 2015. 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   NCS/EMG of the legs 03/13/2018:  This is an abnormal study. There is electrophysiologic evidence of length-dependent,  moderately-severe, axonal sensory-motor polyneuropathy.   CT chest 07/11/2018:  Negative for thymoma  MRI cervical spine wo contrast 07/14/2018: 1. No acute finding. 2. Disc and facet degeneration causes foraminal impingement that is advanced on the left at c3-4 and on the left at C5-6. 3. Mild right foraminal narrowing at C5-6 and C6-7.    Thank you for allowing me to participate in patient's care.  If I can answer any additional questions, I would be pleased to do so.    Sincerely,    Vitoria Conyer K. [name], DO

## 2022-09-17 NOTE — Patient Instructions (Signed)
Continue prednisone 8mg  for the November and December, then reduce to 7mg  daily x 2 months.  Return to clinic in 3 months

## 2022-09-25 DIAGNOSIS — Z85828 Personal history of other malignant neoplasm of skin: Secondary | ICD-10-CM | POA: Diagnosis not present

## 2022-09-25 DIAGNOSIS — Z08 Encounter for follow-up examination after completed treatment for malignant neoplasm: Secondary | ICD-10-CM | POA: Diagnosis not present

## 2022-09-25 DIAGNOSIS — C4442 Squamous cell carcinoma of skin of scalp and neck: Secondary | ICD-10-CM | POA: Diagnosis not present

## 2022-09-25 DIAGNOSIS — D492 Neoplasm of unspecified behavior of bone, soft tissue, and skin: Secondary | ICD-10-CM | POA: Diagnosis not present

## 2022-09-25 DIAGNOSIS — H26493 Other secondary cataract, bilateral: Secondary | ICD-10-CM | POA: Diagnosis not present

## 2022-09-25 DIAGNOSIS — L814 Other melanin hyperpigmentation: Secondary | ICD-10-CM | POA: Diagnosis not present

## 2022-09-25 DIAGNOSIS — D225 Melanocytic nevi of trunk: Secondary | ICD-10-CM | POA: Diagnosis not present

## 2022-09-25 DIAGNOSIS — L821 Other seborrheic keratosis: Secondary | ICD-10-CM | POA: Diagnosis not present

## 2022-09-25 DIAGNOSIS — C44529 Squamous cell carcinoma of skin of other part of trunk: Secondary | ICD-10-CM | POA: Diagnosis not present

## 2022-09-25 DIAGNOSIS — L570 Actinic keratosis: Secondary | ICD-10-CM | POA: Diagnosis not present

## 2022-09-28 DIAGNOSIS — D492 Neoplasm of unspecified behavior of bone, soft tissue, and skin: Secondary | ICD-10-CM | POA: Diagnosis not present

## 2022-10-01 ENCOUNTER — Ambulatory Visit: Payer: Medicare Other | Admitting: Dermatology

## 2022-10-11 ENCOUNTER — Ambulatory Visit: Payer: Medicare Other | Attending: Cardiovascular Disease

## 2022-10-11 DIAGNOSIS — Z5181 Encounter for therapeutic drug level monitoring: Secondary | ICD-10-CM

## 2022-10-11 DIAGNOSIS — Z7901 Long term (current) use of anticoagulants: Secondary | ICD-10-CM

## 2022-10-11 DIAGNOSIS — I4891 Unspecified atrial fibrillation: Secondary | ICD-10-CM | POA: Diagnosis not present

## 2022-10-11 LAB — POCT INR: INR: 2.1 (ref 2.0–3.0)

## 2022-10-11 NOTE — Patient Instructions (Signed)
Description   Continue taking 1 tablet (5mg ) daily.  Recheck INR in 6 weeks.  Call the Coumadin Clinic with any upcoming procedures or new medications.  Coumadin Clinic [phone]

## 2022-10-16 ENCOUNTER — Ambulatory Visit (INDEPENDENT_AMBULATORY_CARE_PROVIDER_SITE_OTHER): Payer: Medicare Other | Admitting: Podiatry

## 2022-10-16 ENCOUNTER — Encounter: Payer: Self-pay | Admitting: Podiatry

## 2022-10-16 DIAGNOSIS — M722 Plantar fascial fibromatosis: Secondary | ICD-10-CM

## 2022-10-16 MED ORDER — TRIAMCINOLONE ACETONIDE 40 MG/ML IJ SUSP
40.0000 mg | Freq: Once | INTRAMUSCULAR | Status: AC
  Administered 2022-10-16: 40 mg

## 2022-10-16 NOTE — Progress Notes (Signed)
Presents today for follow-up of neuroma third interspace bilaterally.  He says is still bad it really hurts along the outside feels like it is pulling on the bottom of the foot along the medial longitudinal arch as he points to that area.  Objective: Vital signs are stable alert oriented x 3 still has pain on palpation of the forefoot and range of motion of the metatarsal phalangeal joints.  He has tenderness on palpation medial longitudinal arch and particularly at its insertion site of the plantar fascia at the medial calcaneal tubercle.  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  Plan: I injected the bilateral heels today 20 mg Kenalog 5 mg Marcaine point maximal tenderness and discussed appropriate shoe gear I will follow-up with him in 1 month to see if he is improving.

## 2022-10-31 ENCOUNTER — Other Ambulatory Visit: Payer: Self-pay | Admitting: Student

## 2022-10-31 DIAGNOSIS — L821 Other seborrheic keratosis: Secondary | ICD-10-CM | POA: Diagnosis not present

## 2022-10-31 DIAGNOSIS — D492 Neoplasm of unspecified behavior of bone, soft tissue, and skin: Secondary | ICD-10-CM | POA: Diagnosis not present

## 2022-10-31 DIAGNOSIS — L989 Disorder of the skin and subcutaneous tissue, unspecified: Secondary | ICD-10-CM | POA: Diagnosis not present

## 2022-11-09 DIAGNOSIS — Z9889 Other specified postprocedural states: Secondary | ICD-10-CM | POA: Insufficient documentation

## 2022-11-09 DIAGNOSIS — C4442 Squamous cell carcinoma of skin of scalp and neck: Secondary | ICD-10-CM | POA: Diagnosis not present

## 2022-11-22 DIAGNOSIS — C44529 Squamous cell carcinoma of skin of other part of trunk: Secondary | ICD-10-CM | POA: Diagnosis not present

## 2022-11-23 ENCOUNTER — Ambulatory Visit: Payer: Medicare Other | Attending: Cardiovascular Disease

## 2022-11-23 DIAGNOSIS — Z7901 Long term (current) use of anticoagulants: Secondary | ICD-10-CM

## 2022-11-23 DIAGNOSIS — Z5181 Encounter for therapeutic drug level monitoring: Secondary | ICD-10-CM

## 2022-11-23 DIAGNOSIS — I4891 Unspecified atrial fibrillation: Secondary | ICD-10-CM | POA: Diagnosis not present

## 2022-11-23 LAB — POCT INR: INR: 2.4 (ref 2.0–3.0)

## 2022-11-23 NOTE — Patient Instructions (Signed)
Description   Continue taking 1 tablet (5mg ) daily.  Recheck INR in 7 weeks.  Call the Coumadin Clinic with any upcoming procedures or new medications.  Coumadin Clinic [phone]

## 2022-11-27 ENCOUNTER — Encounter: Payer: Self-pay | Admitting: Podiatry

## 2022-11-27 ENCOUNTER — Ambulatory Visit (INDEPENDENT_AMBULATORY_CARE_PROVIDER_SITE_OTHER): Payer: Medicare Other | Admitting: Podiatry

## 2022-11-27 DIAGNOSIS — M722 Plantar fascial fibromatosis: Secondary | ICD-10-CM

## 2022-11-27 MED ORDER — TRIAMCINOLONE ACETONIDE 40 MG/ML IJ SUSP
40.0000 mg | Freq: Once | INTRAMUSCULAR | Status: AC
  Administered 2022-11-27: 40 mg

## 2022-11-27 NOTE — Progress Notes (Signed)
He presents today for a follow-up of his Planter fasciitis he states that he is doing a lot better his Achilles is not hurting him at all he states that he has to get up in the middle of the night or first thing in the morning he still has some uneasiness in his feet with the arthritis and heel pain.  Objective: Vital signs are stable he is alert and oriented x 3.  Pulses are palpable.  He still has some residual heel heel pain on the medial aspect of the medial calcaneal tubercle.  They are not warm to the touch it is not overly inflamed but is tender in that 1 spot.  Assessment: Slowly resolving Achilles tendinitis and Planter fasciitis.  Plan: Reinjected 10 mg of Kenalog and 5 mg Marcaine to the point of maximal tenderness today.  He tolerated procedure well without complications.  Follow-up with him in 2 months if necessary

## 2022-12-04 ENCOUNTER — Encounter: Payer: Self-pay | Admitting: Neurology

## 2022-12-05 MED ORDER — PREDNISONE 1 MG PO TABS: ORAL_TABLET | ORAL | 1 refills | Status: DC

## 2022-12-05 MED ORDER — PREDNISONE 5 MG PO TABS: 5.0000 mg | ORAL_TABLET | Freq: Every day | ORAL | 3 refills | Status: DC

## 2022-12-24 DIAGNOSIS — L821 Other seborrheic keratosis: Secondary | ICD-10-CM | POA: Diagnosis not present

## 2022-12-24 DIAGNOSIS — L565 Disseminated superficial actinic porokeratosis (DSAP): Secondary | ICD-10-CM | POA: Diagnosis not present

## 2022-12-25 DIAGNOSIS — M47814 Spondylosis without myelopathy or radiculopathy, thoracic region: Secondary | ICD-10-CM | POA: Diagnosis not present

## 2023-01-01 ENCOUNTER — Encounter: Payer: Self-pay | Admitting: Neurology

## 2023-01-01 ENCOUNTER — Ambulatory Visit (INDEPENDENT_AMBULATORY_CARE_PROVIDER_SITE_OTHER): Payer: Medicare Other | Admitting: Neurology

## 2023-01-01 VITALS — BP 108/72 | HR 89 | Ht 72.0 in | Wt 206.0 lb

## 2023-01-01 DIAGNOSIS — G7000 Myasthenia gravis without (acute) exacerbation: Secondary | ICD-10-CM | POA: Diagnosis not present

## 2023-01-01 DIAGNOSIS — M47814 Spondylosis without myelopathy or radiculopathy, thoracic region: Secondary | ICD-10-CM | POA: Diagnosis not present

## 2023-01-01 MED ORDER — PREDNISONE 1 MG PO TABS: ORAL_TABLET | ORAL | 1 refills | Status: DC

## 2023-01-01 NOTE — Progress Notes (Signed)
Follow-up Visit   Date: 09/17/22    [name] MRN: [id number] DOB: 09-09-1941   Interim History: [name] is a 82 y.o. right-handed Caucasian male with hyperlipidemia, COPD, peripheral neuropathy, SVT s/p ablation, afib on anticoagulation, BPH, former smoker, and diverticulitis returning to the clinic for follow-up of myasthenia gravis.  The patient was accompanied to the clinic by wife.   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  Initially, hospitalized in Sept 2019, responsive to IVIG and prednisone '30mg'$ /d.  Since this time, he has been on a tapering dose prednisone and tolerated it well.  He has been on prednisone '20mg'$  since 2020 with no further exacerbations.  No evidence of disease manifestation - Continue slow prednisone taper with lowering it to '7mg'$  daily x 2 months, then stay on '6mg'$ /d.  - If he has any breakthrough weakness during taper, increase back to '10mg'$  daily  - Continue mestinon '30mg'$  three daily.  OK to take extra '30mg'$  as needed for breakthrough symptoms.    2.  Multilevel lumbar spondylosis causing intermittent leg weakness, unchanged  - Completed PT with no marked benefit  - He is getting ESI by Dr. Jacelyn Grip at [facility]  Return to clinic in 3-4 months  ---------------------------------------------------------------------- History of present illness: Patient was diagnosed with myasthenia gravis in August 2019 after presenting with left ptosis, facial weakness, double vision, and dysphagia. He went to the ER for these symptoms where stroke was excluded and AChR antibodies returned positive. He was started on mestinon '30mg'$  three times daily (8am, 4pm, and midnight).  He was started on prednisone '20mg'$ , with no improvement.  He was hospitalized from 9/19 - 07/23/2018 with MG exacerbated and treated with IVIG with great response. His prednisone was increased to '30mg'$  daily and he was  started on IVIG, which helped control MG symptoms. During 2020, his prednisone was tapered and he has been on prednisone '10mg'$  daily.    He also complains of severe neck pain which is worse with prolonged standing and neck extension.  He usually has to sit back down and rest for relief. He does not have numbness/tingling of the hands or weakness.    He has previously been evaluated for neuropathy at [facility] and [place] with NCS/EMG which showed peripheral sensorimotor axonal neuropathy.  CSF testing was normal and genetic testing was normal.    UPDATE 05/25/2021:   He was at the beach last week and did well in the hot temperatures. His wife states he has rare spells of difficulty swallowing, but also feels he may not be chewing his food.  He is compliant with prednisone '10mg'$  daily and mestinon '30mg'$  three times daily.  UPDATE 01/12/2022:   He was hospitalized in January for diverticulitis.  He remains complaint with prednisone '10mg'$  daily and mestinon '30mg'$  TID.    UPDATE 09/17/2022:  He is on prednisone '8mg'$  since the beginning of the month and denies any new weakness.  UPDATE 01/01/2023:  He is here for follow-up visit.  He remains on prednisone '8mg'$ .  He is tolerating this well and denies weakness, droopy eyelids, or double vision.   Wife says that after eating his food, he tends to cough for some time, but there is no choking.  He has a few areas on his skin resected for precancerous lesions and is doing well.   Medications:  Current Outpatient Medications on File Prior to Visit  Medication Sig Dispense Refill   albuterol (VENTOLIN HFA) 108 (  90 Base) MCG/ACT inhaler Inhale 1-2 puffs into the lungs every 6 (six) hours as needed for wheezing or shortness of breath. 18 g 5   atorvastatin (LIPITOR) 40 MG tablet TAKE 1 TABLET BY MOUTH EVERY DAY 90 tablet 3   budesonide-formoterol (SYMBICORT) 80-4.5 MCG/ACT inhaler Inhale 2 puffs into the lungs in the morning and at bedtime. 1 each 12   calcium carbonate  (TUMS - DOSED IN MG ELEMENTAL CALCIUM) 500 MG chewable tablet Chew 500 mg by mouth daily.     Cyanocobalamin (VITAMIN B-12 PO) Take 5,000 mcg by mouth daily.      doxazosin (CARDURA) 8 MG tablet Take 8 mg by mouth daily.     finasteride (PROSCAR) 5 MG tablet 5 mg daily.     fluticasone (FLONASE) 50 MCG/ACT nasal spray Place 2 sprays into both nostrils daily.     glucosamine-chondroitin 500-400 MG tablet Take 1 tablet by mouth daily.     ipratropium (ATROVENT) 0.03 % nasal spray Place 1 spray into both nostrils daily as needed for rhinitis. 30 mL 2   metoprolol succinate (TOPROL-XL) 50 MG 24 hr tablet Take 1 tablet (50 mg total) by mouth daily. Please keep upcoming appt for future refills. 90 tablet 3   montelukast (SINGULAIR) 10 MG tablet Take 10 mg by mouth daily.     Multiple Vitamin (MULTIVITAMIN WITH MINERALS) TABS tablet Take 1 tablet by mouth daily.     neomycin-polymyxin-hydrocortisone (CORTISPORIN) OTIC solution Apply one to two drops to toe after soaking twice daily. 10 mL 0   predniSONE (DELTASONE) 1 MG tablet Take total of '9mg'$  ('1mg'$  x 4 + '5mg'$ ) for one month, then reduce to '8mg'$  daily. (Patient taking differently: Take '8mg'$  daily.) 120 tablet 3   predniSONE (DELTASONE) 10 MG tablet Take 1 tablet (10 mg total) by mouth daily with breakfast. 90 tablet 3   pyridostigmine (MESTINON) 60 MG tablet Take 0.5 tablets (30 mg total) by mouth 3 (three) times daily. Take half tablet at 8am, 1pm, and 6pm 135 tablet 3   sodium chloride (OCEAN) 0.65 % SOLN nasal spray Place 1 spray into both nostrils daily as needed for congestion.     Spacer/Aero-Holding Chambers (AEROCHAMBER MV) inhaler Use as instructed 1 each 0   No current facility-administered medications on file prior to visit.    Allergies:  Allergies  Allergen Reactions   Ambrosia Trifida (Tall Ragweed) Allergy Skin Test Anaphylaxis   Bee Pollen Itching    Nasal congestion   Beta Adrenergic Blockers     Other reaction(s): MG    Ciprofloxacin     Other reaction(s): myasthenia gravis   Eliquis [Apixaban]     Throat tightening   Erythromycin     Other reaction(s): MG   Pollen Extract Other (See Comments)    Nasal congestion    Vital Signs:  BP 121/72   Pulse 63   Ht 6' (1.829 m)   Wt 204 lb (92.5 kg)   SpO2 94%   BMI 27.67 kg/m    Neurological Exam: MENTAL STATUS including orientation to time, place, person, recent and remote memory, attention span and concentration, language, and fund of knowledge is normal.  Speech is not dysarthric.  CRANIAL NERVES:   Normal conjugate, extra-ocular eye movements in all directions of gaze.  Mild ptosis (stable)  Face is symmetric.  Orbicularis oculi, buccinator, and orbicularis oris is 5/5.  Tongue strength is 5/5  MOTOR:  Motor strength is 5/5 in all extremities.  Neck flexion is 5/5.  REFLEXES:  Reflexes are 2+/4 throughout, except absent at the ankles.   COORDINATION/GAIT:    Gait narrow based, stooped, mildly antalgic appearing. Unassisted, stable.   Data: Labs 06/15/2018:  AChR binding 1.69*, MUSK neg   MRI brain wo contrast 06/14/2018: Negative for acute infarct. 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   CTA head and neck 06/14/2018: 1. Negative for large vessel occlusion, dissection, or arterial stenosis in the head or neck. There is minimal atherosclerosis. 2. Positive for generalized arterial tortuosity. Ectatic distal aortic arch and proximal descending aorta. 3. A small 15 mm area of ground-glass opacity in the right upper lobe appears stable since 2015. 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   NCS/EMG of the legs 03/13/2018:  This is an abnormal study. 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   CT chest 07/11/2018:  Negative for thymoma  MRI cervical spine wo contrast 07/14/2018: 1. No acute  finding. 2. Disc and facet degeneration causes foraminal impingement that is advanced on the left at c3-4 and on the left at C5-6. 3. Mild right foraminal narrowing at C5-6 and C6-7.    Thank you for allowing me to participate in patient's care.  If I can answer any additional questions, I would be pleased to do so.    Sincerely,    Chong January K. [name], DO

## 2023-01-01 NOTE — Patient Instructions (Signed)
Start to take prednisone '7mg'$  daily x 2 months (March and April, then take '6mg'$  starting in May.  I will see you back in late May/June

## 2023-01-08 DIAGNOSIS — G609 Hereditary and idiopathic neuropathy, unspecified: Secondary | ICD-10-CM | POA: Diagnosis not present

## 2023-01-08 DIAGNOSIS — D126 Benign neoplasm of colon, unspecified: Secondary | ICD-10-CM | POA: Diagnosis not present

## 2023-01-08 DIAGNOSIS — R7303 Prediabetes: Secondary | ICD-10-CM | POA: Diagnosis not present

## 2023-01-08 DIAGNOSIS — I700 Atherosclerosis of aorta: Secondary | ICD-10-CM | POA: Diagnosis not present

## 2023-01-08 DIAGNOSIS — E782 Mixed hyperlipidemia: Secondary | ICD-10-CM | POA: Diagnosis not present

## 2023-01-08 DIAGNOSIS — J439 Emphysema, unspecified: Secondary | ICD-10-CM | POA: Diagnosis not present

## 2023-01-08 DIAGNOSIS — Z0000 Encounter for general adult medical examination without abnormal findings: Secondary | ICD-10-CM | POA: Diagnosis not present

## 2023-01-08 DIAGNOSIS — J449 Chronic obstructive pulmonary disease, unspecified: Secondary | ICD-10-CM | POA: Diagnosis not present

## 2023-01-08 DIAGNOSIS — D6869 Other thrombophilia: Secondary | ICD-10-CM | POA: Diagnosis not present

## 2023-01-08 DIAGNOSIS — I4891 Unspecified atrial fibrillation: Secondary | ICD-10-CM | POA: Diagnosis not present

## 2023-01-08 DIAGNOSIS — G7000 Myasthenia gravis without (acute) exacerbation: Secondary | ICD-10-CM | POA: Diagnosis not present

## 2023-01-08 DIAGNOSIS — E778 Other disorders of glycoprotein metabolism: Secondary | ICD-10-CM | POA: Diagnosis not present

## 2023-01-08 LAB — LAB REPORT - SCANNED
A1c: 6
Creatinine, POC: 110 mg/dL
EGFR: 85
Microalb Creat Ratio: 6.4
Microalbumin, Urine: 0.7

## 2023-01-11 ENCOUNTER — Ambulatory Visit: Payer: Medicare Other | Attending: Cardiology | Admitting: *Deleted

## 2023-01-11 DIAGNOSIS — I4891 Unspecified atrial fibrillation: Secondary | ICD-10-CM | POA: Diagnosis not present

## 2023-01-11 DIAGNOSIS — Z5181 Encounter for therapeutic drug level monitoring: Secondary | ICD-10-CM

## 2023-01-11 LAB — POCT INR: POC INR: 3.1

## 2023-01-11 NOTE — Patient Instructions (Signed)
Description   Take 1/2 a tablet of warfarin today and then Continue taking 1 tablet (5mg ) daily.  Recheck INR in 5 weeks.  Call the Coumadin Clinic with any upcoming procedures or new medications.  Coumadin Clinic [phone]

## 2023-01-17 ENCOUNTER — Ambulatory Visit (INDEPENDENT_AMBULATORY_CARE_PROVIDER_SITE_OTHER): Payer: Medicare Other | Admitting: Podiatry

## 2023-01-17 ENCOUNTER — Encounter: Payer: Self-pay | Admitting: Podiatry

## 2023-01-17 DIAGNOSIS — G5793 Unspecified mononeuropathy of bilateral lower limbs: Secondary | ICD-10-CM

## 2023-01-17 DIAGNOSIS — M722 Plantar fascial fibromatosis: Secondary | ICD-10-CM | POA: Diagnosis not present

## 2023-01-17 MED ORDER — TRIAMCINOLONE ACETONIDE 40 MG/ML IJ SUSP
40.0000 mg | Freq: Once | INTRAMUSCULAR | Status: AC
  Administered 2023-01-17: 40 mg

## 2023-01-17 NOTE — Progress Notes (Signed)
He presents today for follow-up of his Planter fasciitis states that he is feeling some better but he is starting to develop weird sensations in his toes and around his distal arch.  He states that it feels funny in the arch and also had this bump on top of the foot that went away at nighttime.  Objective: Vital signs are stable he is alert and oriented x 3 right foot does demonstrate a dorsal exostosis secondary to osteoarthritis of the first carpometacarpal joint.  He also has pain on palpation medial calcaneal tubercles and loss of sensation to the tufts of the toes bilaterally.  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  Ganglion cyst dorsal aspect of the right foot  Plan: I injected the bilateral plantar fascia with 10 mg Kenalog 5 mg Marcaine point maximal tenderness.  Tolerated procedure well without complications.

## 2023-01-21 ENCOUNTER — Ambulatory Visit: Payer: Medicare Other | Admitting: Neurology

## 2023-01-27 NOTE — Progress Notes (Unsigned)
Synopsis: Referred for chronic cough by [name], MD  Subjective:   PATIENT ID: [name] GENDER: male DOB: 12-15-1940, MRN: [id number]  No chief complaint on file. 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 Never has lived anywhere else other than [place], [place]. [place]. Pets: No pets. No birds. No feather pillows or down comforters.  Travel: none pertinent  Had ablation for some form of SVT vs AF about 6 years ago.   Interval HPI:   Still alive he says. Has remained off of symbicort and cough has been a bit more productive recently. No worsening DOE. No prednisone bursts or ABX for AECOPD since last visit.  ------------------------------------------- Restarted on symbicort 80 last visit, flutter  Overall ok relative to last visit.   He is down to 7mg  prednisone daily now.   No prednisone bursts or ABX for AECOPD since last visit.   Cough with PND, phlegm production is unchanged since last visit.   Otherwise pertinent review of systems is negative.    Past Medical History:  Diagnosis Date   Abscess of sigmoid colon due to diverticulitis 12/26/2018   Appendicitis 09/05/2014   Basal cell carcinoma 10/04/2015   left [id number]   BCC (basal cell carcinoma) 11/03/2019   left shin (txpbx)   Colon polyp    Complication of anesthesia    Difficult to arouse, Combative x 1   COVID    Diverticulitis    Diverticulosis    extensive 3'15.   Dysrhythmia    hx. 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  Many polyps.  rec f/u colonoscopy summer 2020   EUS N/A 11/17/2014   Procedure: ESOPHAGEAL ENDOSCOPIC ULTRASOUND (EUS) RADIAL;  Surgeon: [name], MD;  Location: [facility];  Service: Endoscopy;  Laterality: N/A;   FINE NEEDLE ASPIRATION N/A 11/17/2014   Procedure: FINE NEEDLE ASPIRATION (FNA) LINEAR;  Surgeon: [name], MD;  Location: [facility];  Service: Endoscopy;  Laterality: N/A;  +  or- fna   FLEXIBLE SIGMOIDOSCOPY     HEMORRHOID SURGERY     banding    INGUINAL HERNIA REPAIR Bilateral 2005   '05 bilateral hernia   KNEE ARTHROSCOPY Left 2008   scope '08   LAPAROSCOPIC APPENDECTOMY N/A 09/05/2014   Procedure: APPENDECTOMY LAPAROSCOPIC;  Surgeon: [name], MD;  Location: [facility];  Service: General;  Laterality: N/A;   PROCTOSCOPY N/A 12/26/2018   Procedure: RIGID PROCTOSCOPY;  Surgeon: [name], MD;  Location: [facility];  Service: General;  Laterality: N/A;   ROTATOR CUFF REPAIR Right 2016    Social History   Socioeconomic History   Marital status: Married    Spouse name: Not on file   Number of children: 3   Years of education: 14    Highest education level: Some college, no degree  Occupational History   Occupation: sales-Retired, works one day a week  Tobacco Use   Smoking status: Former    Packs/day: 1.00    Years: 47.00    Additional pack years: 0.00    Total pack years: 47.00    Types: Cigarettes    Quit date: 10/29/2000    Years since quitting: 22.2   Smokeless tobacco: Never  Vaping Use   Vaping Use: Never used  Substance and Sexual Activity   Alcohol use: Yes    Alcohol/week: 1.0 standard drink of alcohol    Types: 1 Standard drinks or equivalent per week    Comment: socially- nightly 1 cocktail   Drug use: No   Sexual activity: Not on file  Other Topics Concern   Not on file  Social History Narrative   Lives with wife in a one story home with a basement.  Has 3 daughters.  Works one day a week at the [employer].  Retired from Press photographer.  Education: some college.    Caffeine use: decaf   Right handed   One story home   Social Determinants of Health   Financial Resource Strain: Not on file  Food Insecurity: Not on file  Transportation Needs: Not on file  Physical Activity: Not on file  Stress: Not on file  Social Connections: Not on file  Intimate Partner Violence: Not on file     Allergies  Allergen Reactions   Ambrosia Trifida (Tall Ragweed) Allergy Skin Test Anaphylaxis   Bee Pollen Itching    Nasal congestion   Beta Adrenergic Blockers     Other reaction(s): MG   Ciprofloxacin     Other reaction(s): myasthenia gravis   Eliquis [Apixaban]     Throat tightening   Erythromycin     Other reaction(s): MG   Pollen Extract Other (See Comments)    Nasal congestion     Outpatient Medications Prior to Visit  Medication Sig Dispense Refill   albuterol (VENTOLIN HFA) 108 (90 Base) MCG/ACT inhaler Inhale 1-2 puffs into the lungs every 6 (six) hours as needed for wheezing or shortness of breath. 18 g 5   atorvastatin (LIPITOR) 40 MG tablet TAKE 1 TABLET BY MOUTH EVERY DAY 90 tablet 3    calcium carbonate (TUMS - DOSED IN MG ELEMENTAL CALCIUM) 500 MG chewable tablet Chew 500 mg by mouth daily.     Cyanocobalamin (VITAMIN B-12 PO) Take 5,000 mcg by mouth daily.      doxazosin (CARDURA) 8 MG tablet Take 8 mg by mouth daily.     finasteride (PROSCAR) 5 MG tablet 5 mg daily.     fluticasone (FLONASE) 50 MCG/ACT nasal spray  Place 2 sprays into both nostrils daily.     glucosamine-chondroitin 500-400 MG tablet Take 1 tablet by mouth daily.     ipratropium (ATROVENT) 0.03 % nasal spray PLACE 1 SPRAY INTO BOTH NOSTRILS DAILY AS NEEDED FOR RHINITIS. 30 mL 2   metoprolol succinate (TOPROL-XL) 50 MG 24 hr tablet Take 1 tablet (50 mg total) by mouth daily. Please keep upcoming appt for future refills. 90 tablet 3   montelukast (SINGULAIR) 10 MG tablet Take 10 mg by mouth daily.     Multiple Vitamin (MULTIVITAMIN WITH MINERALS) TABS tablet Take 1 tablet by mouth daily.     predniSONE (DELTASONE) 1 MG tablet Take 2mg  daily with 5mg  tablet = 7mg  x 2 months, then 6mg  daily. 270 tablet 1   predniSONE (DELTASONE) 5 MG tablet Take 1 tablet (5 mg total) by mouth daily with breakfast. 90 tablet 3   pyridostigmine (MESTINON) 60 MG tablet Take 0.5 tablets (30 mg total) by mouth 3 (three) times daily. Take half tablet at 8am, 1pm, and 6pm 135 tablet 3   sodium chloride (OCEAN) 0.65 % SOLN nasal spray Place 1 spray into both nostrils daily as needed for congestion.     warfarin (COUMADIN) 5 MG tablet TAKE 1 TABLET (5 MG TOTAL) BY MOUTH DAILY. OR AS DIRECTED BY COUMADIN CLINIC _30 DAY SUPPLY 90 tablet 1   budesonide-formoterol (SYMBICORT) 80-4.5 MCG/ACT inhaler Inhale 2 puffs into the lungs in the morning and at bedtime. 1 each 12   No facility-administered medications prior to visit.       Objective:   Physical Exam:  General appearance: 82 y.o., male, NAD, conversant  Eyes: anicteric sclerae, moist conjunctivae; no lid-lag; PERRL, tracking appropriately HENT: NCAT; oropharynx, MMM, no mucosal  ulcerations; normal hard and soft palate Neck: Trachea midline; no lymphadenopathy, no JVD Lungs: Diminished bl, no crackles, with normal respiratory effort CV: RRR, no MRGs  Abdomen: Soft, non-tender; non-distended, BS present  Extremities: No peripheral edema, radial and DP pulses present bilaterally  Skin: Normal temperature, turgor and texture; no rash Psych: Appropriate affect Neuro: Alert and oriented to person and place, no focal deficit    Vitals:   01/29/23 1414  BP: 112/66  Pulse: 76  Temp: (!) 97.4 F (36.3 C)  TempSrc: Oral  SpO2: 93%  Weight: 207 lb 9.6 oz (94.2 kg)  Height: 6' (1.829 m)      93% on RA BMI Readings from Last 3 Encounters:  01/29/23 28.16 kg/m  01/01/23 27.94 kg/m  09/17/22 27.67 kg/m   Wt Readings from Last 3 Encounters:  01/29/23 207 lb 9.6 oz (94.2 kg)  01/01/23 206 lb (93.4 kg)  09/17/22 204 lb (92.5 kg)     CBC    Component Value Date/Time   WBC 6.8 11/05/2021 0501   RBC 4.03 (L) 11/05/2021 0501   HGB 12.0 (L) 11/05/2021 0501   HGB 15.2 03/08/2016 1133   HGB 15.0 11/02/2014 1104   HCT 37.5 (L) 11/05/2021 0501   HCT 46.7 06/14/2018 1605   HCT 45.6 11/02/2014 1104   PLT 189 11/05/2021 0501   PLT 249 03/08/2016 1133   MCV 93.1 11/05/2021 0501   MCV 88 03/08/2016 1133   MCV 89.1 11/02/2014 1104   MCH 29.8 11/05/2021 0501   MCHC 32.0 11/05/2021 0501   RDW 14.3 11/05/2021 0501   RDW 14.0 03/08/2016 1133   RDW 13.9 11/02/2014 1104   LYMPHSABS 0.8 11/03/2021 1023   LYMPHSABS 1.2 11/02/2014 1104   MONOABS 0.9 11/03/2021 1023  MONOABS 0.5 11/02/2014 1104   EOSABS 0.1 11/03/2021 1023   EOSABS 0.2 11/02/2014 1104   BASOSABS 0.0 11/03/2021 1023   BASOSABS 0.0 11/02/2014 1104    Low level peripheral eosinophilia historically  Timothy grass, ragweed aeroallergen +  Chest Imaging: CTA Chest 04/2020 OSH report reviewed by me: no PE, +bilateral atelectasis, multiple hepatic hypodensities, small PCE   CT Chest 02/10/21:  personally reviewed by me and remarkable for stable RUL gg nodule, lower lobe bronchiolitis R>L, secretions in RMB, RLL. Emphysema.  Pulmonary Functions Testing Results:    Latest Ref Rng & Units 07/18/2021   11:54 AM  PFT Results  FVC-Pre L 3.79   FVC-Predicted Pre % 89   FVC-Post L 4.04   FVC-Predicted Post % 95   Pre FEV1/FVC % % 62   Post FEV1/FCV % % 62   FEV1-Pre L 2.34   FEV1-Predicted Pre % 77   FEV1-Post L 2.50   DLCO uncorrected ml/min/mmHg 19.80   DLCO UNC% % 77   DLCO corrected ml/min/mmHg 19.80   DLCO COR %Predicted % 77   DLVA Predicted % 89   TLC L 7.18   TLC % Predicted % 97   RV % Predicted % 123    07/18/21 PFT Reviewed by me ratio 62, post BD FEV1 82%, very mildly reduced diffusing capacitiy  Reviewed [facility] PFTs: 01/2020 FEV1/FVC 54, post BD FEV1 82.8% without BD response.     Echocardiogram:   05/03/20 [id number] PCE, age appropriate diastolic parameters     Assessment & Plan:   # chronic cough: has had trials of ppi/H2B. Probably multifactorial with COPD, UACS with persistent postnasal drip, possible component of LPR (despite negative trial of PPI) but would have expected more edematous appearing vocal cords/folds on laryngoscopy. Does additionally have bronchial wall thickening/borderline bronchiectasis and secretions in major airways, RLL changes that appear consistent with recurrent aspiration.   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 Walking oximetry today nadir of 89% with normal WOB.  # MG: MEP was just below LLN. TLC, FVC WNL 02/04/20. Periodic surveillance spiro.  # RUL GG pulmonary nodule: stable since 2015. No further need for surveillance.  Plan: - continue flonase and singulair for component of AR - flutter valve 10 slow but firm puffs after albuterol 1-2 puffs until gets new controller inhaler at which point he'll use controller inhaler followed by flutter valve  - we have talked extensively about value of pursuing MBS/speech  eval. Although he's no longer having episodes of frank aspiration with better MG control, his most recent CT Chest is suggestive of either ongoing microaspiration or difficulty with clearing secretions (weak cough) or both. He has declined speech eval.  - Will price out alternative LABA/ICS inhalers to symbicort which is too costly with $100 copay. Per Dr. [name] would prefer to avoid LAMA if possible.    RTC 6 months   Maryjane Hurter, MD [facility] 01/29/2023 4:44 PM

## 2023-01-28 DIAGNOSIS — M47814 Spondylosis without myelopathy or radiculopathy, thoracic region: Secondary | ICD-10-CM | POA: Diagnosis not present

## 2023-01-28 DIAGNOSIS — M5416 Radiculopathy, lumbar region: Secondary | ICD-10-CM | POA: Diagnosis not present

## 2023-01-29 ENCOUNTER — Encounter: Payer: Self-pay | Admitting: Student

## 2023-01-29 ENCOUNTER — Ambulatory Visit (INDEPENDENT_AMBULATORY_CARE_PROVIDER_SITE_OTHER): Payer: Medicare Other | Admitting: Student

## 2023-01-29 ENCOUNTER — Telehealth: Payer: Self-pay | Admitting: Student

## 2023-01-29 VITALS — BP 112/66 | HR 76 | Temp 97.4°F | Ht 72.0 in | Wt 207.6 lb

## 2023-01-29 DIAGNOSIS — R053 Chronic cough: Secondary | ICD-10-CM | POA: Diagnosis not present

## 2023-01-29 DIAGNOSIS — J449 Chronic obstructive pulmonary disease, unspecified: Secondary | ICD-10-CM

## 2023-01-29 NOTE — Patient Instructions (Signed)
-   I will price out alternatives to symbicort inhaler - can try to time one of your treatments 20 min before exercise if you prefer. Rinse mouth and gargle after use, rinse spacer at least once daily.  - Take shower clear nose of crusting then use your flonase 1 spray each nostril  - Keep up the good work with the flonase, singulair, and aerobika flutter valve 10 slow but firm puffs once daily. Mucinex as needed to thin mucus. Can use atrovent nasal spray as needed to dry up postnasal drainage but not on long term basis ideally. - See you in 6 months or sooner if you have any issues come up!

## 2023-01-29 NOTE — Telephone Encounter (Signed)
What is preferred laba/ics for him?  Thanks!

## 2023-01-31 ENCOUNTER — Other Ambulatory Visit: Payer: Self-pay | Admitting: Student

## 2023-01-31 ENCOUNTER — Other Ambulatory Visit (HOSPITAL_COMMUNITY): Payer: Self-pay

## 2023-01-31 NOTE — Telephone Encounter (Signed)
Per test claims these are the covered ICS/LABA at this time:   Wixela- $71.27 Symbicort- $153.99 Dulera- $336.84 Breo- $369.18

## 2023-02-01 MED ORDER — FLUTICASONE-SALMETEROL 100-50 MCG/ACT IN AEPB: 1.0000 | INHALATION_SPRAY | Freq: Two times a day (BID) | RESPIRATORY_TRACT | 11 refills | Status: DC

## 2023-02-07 DIAGNOSIS — G5701 Lesion of sciatic nerve, right lower limb: Secondary | ICD-10-CM | POA: Diagnosis not present

## 2023-02-15 ENCOUNTER — Ambulatory Visit: Payer: Medicare Other | Attending: Cardiology | Admitting: *Deleted

## 2023-02-15 DIAGNOSIS — I4891 Unspecified atrial fibrillation: Secondary | ICD-10-CM

## 2023-02-15 DIAGNOSIS — Z5181 Encounter for therapeutic drug level monitoring: Secondary | ICD-10-CM | POA: Diagnosis not present

## 2023-02-15 LAB — POCT INR: POC INR: 3.7

## 2023-02-15 NOTE — Patient Instructions (Signed)
Description   Hold warfarin today and then START taking warfarin 1 tablet daily except for 1/2 a tablet on Fridays. Recheck INR in 3 weeks.  Call the Coumadin Clinic with any upcoming procedures or new medications.  [facility] [phone]

## 2023-02-27 ENCOUNTER — Other Ambulatory Visit: Payer: Self-pay | Admitting: Neurology

## 2023-02-28 ENCOUNTER — Encounter: Payer: Self-pay | Admitting: Neurology

## 2023-03-04 DIAGNOSIS — L570 Actinic keratosis: Secondary | ICD-10-CM | POA: Diagnosis not present

## 2023-03-04 DIAGNOSIS — L298 Other pruritus: Secondary | ICD-10-CM | POA: Diagnosis not present

## 2023-03-04 DIAGNOSIS — L814 Other melanin hyperpigmentation: Secondary | ICD-10-CM | POA: Diagnosis not present

## 2023-03-04 DIAGNOSIS — L821 Other seborrheic keratosis: Secondary | ICD-10-CM | POA: Diagnosis not present

## 2023-03-04 DIAGNOSIS — D225 Melanocytic nevi of trunk: Secondary | ICD-10-CM | POA: Diagnosis not present

## 2023-03-04 DIAGNOSIS — D492 Neoplasm of unspecified behavior of bone, soft tissue, and skin: Secondary | ICD-10-CM | POA: Diagnosis not present

## 2023-03-05 ENCOUNTER — Encounter: Payer: Self-pay | Admitting: Neurology

## 2023-03-05 ENCOUNTER — Ambulatory Visit
Admission: RE | Admit: 2023-03-05 | Discharge: 2023-03-05 | Disposition: A | Discharge status: Home / Self Care | Payer: Medicare Other | Source: Ambulatory Visit | Attending: Family Medicine | Admitting: Family Medicine

## 2023-03-05 ENCOUNTER — Other Ambulatory Visit: Payer: Self-pay | Admitting: Family Medicine

## 2023-03-05 DIAGNOSIS — J441 Chronic obstructive pulmonary disease with (acute) exacerbation: Secondary | ICD-10-CM | POA: Diagnosis not present

## 2023-03-05 DIAGNOSIS — J449 Chronic obstructive pulmonary disease, unspecified: Secondary | ICD-10-CM | POA: Diagnosis not present

## 2023-03-05 DIAGNOSIS — K5900 Constipation, unspecified: Secondary | ICD-10-CM | POA: Diagnosis not present

## 2023-03-05 DIAGNOSIS — J439 Emphysema, unspecified: Secondary | ICD-10-CM | POA: Diagnosis not present

## 2023-03-05 DIAGNOSIS — G7000 Myasthenia gravis without (acute) exacerbation: Secondary | ICD-10-CM | POA: Diagnosis not present

## 2023-03-08 ENCOUNTER — Ambulatory Visit: Payer: Medicare Other | Attending: Cardiology | Admitting: Cardiology

## 2023-03-08 ENCOUNTER — Encounter: Payer: Self-pay | Admitting: Cardiology

## 2023-03-08 VITALS — BP 122/77 | HR 66 | Ht 72.0 in | Wt 200.0 lb

## 2023-03-08 DIAGNOSIS — Z7901 Long term (current) use of anticoagulants: Secondary | ICD-10-CM | POA: Insufficient documentation

## 2023-03-08 DIAGNOSIS — I4891 Unspecified atrial fibrillation: Secondary | ICD-10-CM | POA: Diagnosis not present

## 2023-03-08 NOTE — Patient Instructions (Signed)
Medication Instructions:  The current medical regimen is effective;  continue present plan and medications.  *If you need a refill on your cardiac medications before your next appointment, please call your pharmacy*  Please wear knee high compression stickings daily (20-30 mm/hg) Elevate legs above the level of your heart as much as possible during the day.  Follow-Up: At [facility], you and your health needs are our priority.  As part of our continuing mission to provide you with exceptional heart care, we have created designated Provider Care Teams.  These Care Teams include your primary Cardiologist (physician) and Advanced Practice Providers (APPs -  Physician Assistants and Nurse Practitioners) who all work together to provide you with the care you need, when you need it.  We recommend signing up for the patient portal called "MyChart".  Sign up information is provided on this After Visit Summary.  MyChart is used to connect with patients for Virtual Visits (Telemedicine).  Patients are able to view lab/test results, encounter notes, upcoming appointments, etc.  Non-urgent messages can be sent to your provider as well.   To learn more about what you can do with MyChart, go to [email or web address].    Your next appointment:   1 year(s)  Provider:   Donato Schultz, MD

## 2023-03-08 NOTE — Progress Notes (Addendum)
Cardiology Office Note:    Date:  03/08/2023   ID:  [name], DOB Oct 29, 1941, MRN [id number]  PCP:  Irven Coe, MD   [facility] Providers Cardiologist:  Donato Schultz, MD     Referring MD: Irven Coe, MD     History of Present Illness:    [name] is a 82 y.o. male here for follow-up of persistent atrial fibrillation with prior pericarditis with normal CRP.  Troponin had normal findings with no acute ECG changes.  Prior CT angiogram showed no acute PE but small pericardial effusion.  EF was 55%.  This occurred in [place].  Prior records reviewed.  He did not wish to take colchicine because of potential side effects.  Has a history of myasthenia gravis COPD hyperlipidemia.  At a prior visit, atrial fibrillation was noted.  He was asymptomatic with this.  Eliquis was tried but he had terrible side effects with that he states.  He is tolerating the Xarelto well but he just increased in price on his insurance plan.  Now on Coumadin  His EKG recently has shown sinus rhythm.  Now here with pedal edema.  We discussed.  Past Medical History:  Diagnosis Date   Abscess of sigmoid colon due to diverticulitis 12/26/2018   Appendicitis 09/05/2014   Basal cell carcinoma 10/04/2015   left [id number]   BCC (basal cell carcinoma) 11/03/2019   left shin (txpbx)   Colon polyp    Complication of anesthesia    Difficult to arouse, Combative x 1   COVID    Diverticulitis    Diverticulosis    extensive 3'15.   Dysrhythmia    hx. 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  Many polyps.  rec f/u colonoscopy summer 2020   EUS N/A 11/17/2014   Procedure: ESOPHAGEAL ENDOSCOPIC ULTRASOUND (EUS) RADIAL;  Surgeon: Willis Modena, MD;  Location: [facility];  Service: Endoscopy;  Laterality: N/A;   FINE NEEDLE ASPIRATION N/A 11/17/2014   Procedure: FINE NEEDLE ASPIRATION (FNA) LINEAR;  Surgeon: Willis Modena, MD;  Location: [facility];  Service: Endoscopy;  Laterality: N/A;  + or- fna   FLEXIBLE SIGMOIDOSCOPY     HEMORRHOID SURGERY     banding    INGUINAL HERNIA REPAIR Bilateral 2005   '05 bilateral hernia   KNEE ARTHROSCOPY Left 2008   scope '08   LAPAROSCOPIC APPENDECTOMY N/A 09/05/2014   Procedure: APPENDECTOMY LAPAROSCOPIC;  Surgeon: Abigail Miyamoto, MD;  Location: [facility] OR;  Service: General;  Laterality: N/A;   PROCTOSCOPY N/A 12/26/2018   Procedure: RIGID PROCTOSCOPY;  Surgeon: Karie Soda, MD;  Location: [facility] ORS;  Service: General;  Laterality: N/A;   ROTATOR CUFF REPAIR Right 2016    Current Medications: Current Meds  Medication Sig   albuterol (VENTOLIN HFA) 108 (90 Base) MCG/ACT inhaler Inhale 1-2 puffs into the lungs every 6 (six) hours as needed for wheezing or shortness of breath.   atorvastatin (LIPITOR) 40 MG tablet TAKE 1 TABLET BY MOUTH EVERY DAY   calcium carbonate (TUMS - DOSED IN MG ELEMENTAL CALCIUM) 500 MG chewable tablet Chew 500 mg by mouth daily.   Cyanocobalamin (VITAMIN B-12 PO) Take 5,000 mcg by mouth daily.    doxazosin (CARDURA) 8 MG tablet Take 8 mg by mouth daily.   finasteride (PROSCAR) 5 MG tablet 5 mg daily.   fluticasone (FLONASE) 50 MCG/ACT nasal spray Place 2 sprays into both nostrils daily.    fluticasone-salmeterol (WIXELA INHUB) 100-50 MCG/ACT AEPB Inhale 1 puff into the lungs 2 (two) times daily.   glucosamine-chondroitin 500-400 MG tablet Take 1 tablet by mouth daily.   ipratropium (ATROVENT) 0.03 % nasal spray PLACE 1 SPRAY INTO BOTH NOSTRILS DAILY AS NEEDED FOR RHINITIS.   metoprolol succinate (TOPROL-XL) 50 MG 24 hr tablet Take 1 tablet (50 mg total) by mouth daily. Please keep upcoming appt for future refills.   montelukast (SINGULAIR) 10 MG tablet Take 10 mg by mouth daily.   Multiple Vitamin (MULTIVITAMIN WITH MINERALS) TABS tablet Take 1 tablet by mouth daily.   predniSONE (DELTASONE) 1 MG tablet Take 2mg  daily with 5mg  tablet = 7mg  x 2 months, then 6mg  daily.   predniSONE (DELTASONE) 5 MG tablet Take 1 tablet (5 mg total) by mouth daily with breakfast.   pyridostigmine (MESTINON) 60 MG tablet Take 0.5 tablets (30 mg total) by mouth 3 (three) times daily. Take half tablet at 8am, 1pm, and 6pm   warfarin (COUMADIN) 5 MG tablet TAKE 1 TABLET (5 MG TOTAL) BY MOUTH DAILY. OR AS DIRECTED BY COUMADIN CLINIC _30 DAY SUPPLY     Allergies:   Ambrosia trifida (tall ragweed) allergy skin test, Bee pollen, Beta adrenergic blockers, Ciprofloxacin, Eliquis [apixaban], Erythromycin, and Pollen extract   Social History   Socioeconomic History   Marital status: Married    Spouse name: Not on file   Number of children: 3   Years of education: 14   Highest education level: Some college, no degree  Occupational History   Occupation: sales-Retired, works one day a week  Tobacco Use   Smoking status: Former    Packs/day: 1.00    Years: 47.00    Additional pack years: 0.00    Total pack years: 47.00    Types: Cigarettes    Quit date: 10/29/2000    Years since quitting: 22.3   Smokeless tobacco: Never  Vaping Use   Vaping Use: Never used  Substance and Sexual Activity   Alcohol use: Yes    Alcohol/week: 1.0 standard drink of alcohol    Types: 1 Standard drinks or equivalent per  week    Comment: socially- nightly 1 cocktail   Drug use: No   Sexual activity: Not on file  Other Topics Concern   Not on file  Social History Narrative   Lives with wife in a one story home with a basement.  Has 3 daughters.  Works one day a week at the [employer].  Retired from Airline pilot.  Education: some college.    Caffeine use: decaf   Right handed   One story home   Social Determinants of Health   Financial Resource Strain:  Not on file  Food Insecurity: Not on file  Transportation Needs: Not on file  Physical Activity: Not on file  Stress: Not on file  Social Connections: Not on file     Family History: The patient's family history includes Allergies in his brother; Aneurysm in his brother; Pancreatic cancer in his father; Stroke in his mother. There is no history of Neuropathy.  ROS:   Please see the history of present illness.     All other systems reviewed and are negative.  EKGs/Labs/Other Studies Reviewed:    The following studies were reviewed today: ECHO 11/09/20:  1. Left ventricular ejection fraction, by estimation, is 60 to 65%. The  left ventricle has normal function. The left ventricle has no regional  wall motion abnormalities. There is mild concentric and modrate  basal-septal left ventricular hypertrophy.  Left ventricular diastolic parameters are consistent with Grade I  diastolic dysfunction (impaired relaxation).   2. Right ventricular systolic function is normal. The right ventricular  size is normal.   3. The mitral valve is normal in structure. No evidence of mitral valve  regurgitation.   4. The aortic valve is tricuspid. Aortic valve regurgitation is not  visualized. No aortic stenosis is present.   5. Aortic dilatation noted. There is mild dilatation of the aortic root,  measuring 39 mm. There is mild dilatation of the ascending aorta,  measuring 40 mm.   EKG: 03/08/2023 sinus rhythm PACs T wave inversion anterolateral leads.  Prior, EKG is  sinus rhythm 72 T wave inversion fairly diffuse.  Ordered today.  No change from prior Recent Labs: No results found for requested labs within last 365 days.  Recent Lipid Panel    Component Value Date/Time   CHOL 133 12/21/2021 0924   TRIG 127 12/21/2021 0924   HDL 47 12/21/2021 0924   CHOLHDL 2.8 12/21/2021 0924   CHOLHDL 2.9 06/14/2018 1605   VLDL 14 06/14/2018 1605   LDLCALC 64 12/21/2021 0924     Risk Assessment/Calculations:          Physical Exam:    VS:  BP 122/77 (BP Location: Left Arm, Patient Position: Sitting, Cuff Size: Normal)   Pulse 66   Ht 6' (1.829 m)   Wt 200 lb (90.7 kg)   BMI 27.12 kg/m     Wt Readings from Last 3 Encounters:  03/08/23 200 lb (90.7 kg)  01/29/23 207 lb 9.6 oz (94.2 kg)  01/01/23 206 lb (93.4 kg)    GEN: Well nourished, well developed, in no acute distress HEENT: normal Neck: no JVD, carotid bruits, or masses Cardiac: RRR; no murmurs, rubs, or gallops, 2-3+ pedal edema bilateral Respiratory:  clear to auscultation bilaterally, normal work of breathing GI: soft, nontender, nondistended, + BS MS: no deformity or atrophy Skin: warm and dry, no rash Neuro:  Alert and Oriented x 3, Strength and sensation are intact Psych: euthymic mood, full affect    ASSESSMENT:    1. Atrial fibrillation, unspecified type (HCC)   2. Long term (current) use of anticoagulants     PLAN:    In order of problems listed above:  Paroxysmal atrial fibrillation - Currently residing in sinus rhythm.  This was discovered on 05/10/2020 incidentally.  He did not tolerate the Eliquis or Xarelto, currently on Coumadin - Toprol-XL 50 mg was given to improve rate control. - It is possible that an episode of acute pericarditis or pneumonia had triggered this however this was not seen in the hospital setting. He  was asymptomatic previously with the atrial fibrillation.  Acute pericarditis in July 2021 in [place] -  descriptions of shortness of breath  relieved with IV fluids stress dose steroids hypotension myasthenia gravis.  Subtle ST segment changes noted on ECG.  CRP negative.  Did have a small pericardial effusion.  Did not like colchicine because of side effects.  He completed a course of ibuprofen.  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  Stable.  No changes  Myasthenia gravis - Stable on prednisone by Dr. Allena Katz.  Prednisone tried to taper but then ended up with infection and increased.  Family history of CAD/hyperlipidemia/coronary artery calcification personally reviewed on CT scan for lung cancer screening - 1 brother died of stroke and had coronary artery disease. Mother died 45.  -We will go ahead and increase his atorvastatin from 10 mg once a day to 20 mg once a day.  Optimally, would like him to be on 40 but we will start slower given his age.  Repeat lipid panel in 3 months with ALT in 3 months.  LDL goal less than 70.  No changes  COPD bronchitis former smoker - Emphysematous changes noted on CT scan previously at [facility].  Several pack-year history of smoking in the past.  Pedal edema - Dependent edema.  Compression hose elevation.  Prednisone is exacerbating as well.  Watches fluids and salts.  Continue to eat protein rich foods.  Will try to avoid Lasix especially given his myasthenia gravis.   Medication Adjustments/Labs and Tests Ordered: Current medicines are reviewed at length with the patient today.  Concerns regarding medicines are outlined above.  No orders of the defined types were placed in this encounter.   No orders of the defined types were placed in this encounter.    There are no Patient Instructions on file for this visit.   Signed, Donato Schultz, MD  03/08/2023 2:34 PM    [facility]

## 2023-03-14 ENCOUNTER — Ambulatory Visit: Payer: Medicare Other | Attending: Internal Medicine

## 2023-03-14 DIAGNOSIS — I4891 Unspecified atrial fibrillation: Secondary | ICD-10-CM | POA: Insufficient documentation

## 2023-03-14 DIAGNOSIS — Z5181 Encounter for therapeutic drug level monitoring: Secondary | ICD-10-CM | POA: Diagnosis not present

## 2023-03-14 LAB — POCT INR: INR: 4.4 — AB (ref 2.0–3.0)

## 2023-03-14 NOTE — Patient Instructions (Addendum)
Description   Hold warfarin today and hold tomorrow's dose and then continue taking warfarin 1 tablet daily except for 1/2 a tablet on Fridays. Recheck INR in 2 weeks.  Call the Coumadin Clinic with any upcoming procedures or new medications.  [facility] [phone]

## 2023-03-19 DIAGNOSIS — G8929 Other chronic pain: Secondary | ICD-10-CM | POA: Diagnosis not present

## 2023-03-19 DIAGNOSIS — E785 Hyperlipidemia, unspecified: Secondary | ICD-10-CM | POA: Diagnosis not present

## 2023-03-19 DIAGNOSIS — J449 Chronic obstructive pulmonary disease, unspecified: Secondary | ICD-10-CM | POA: Diagnosis not present

## 2023-03-19 DIAGNOSIS — N400 Enlarged prostate without lower urinary tract symptoms: Secondary | ICD-10-CM | POA: Diagnosis not present

## 2023-03-19 DIAGNOSIS — I4891 Unspecified atrial fibrillation: Secondary | ICD-10-CM | POA: Diagnosis not present

## 2023-03-20 ENCOUNTER — Ambulatory Visit (INDEPENDENT_AMBULATORY_CARE_PROVIDER_SITE_OTHER): Payer: Medicare Other | Admitting: Student

## 2023-03-20 ENCOUNTER — Encounter: Payer: Self-pay | Admitting: Student

## 2023-03-20 ENCOUNTER — Other Ambulatory Visit (HOSPITAL_COMMUNITY): Payer: Self-pay

## 2023-03-20 VITALS — BP 110/58 | HR 72 | Temp 97.5°F | Ht 72.0 in | Wt 202.8 lb

## 2023-03-20 DIAGNOSIS — J449 Chronic obstructive pulmonary disease, unspecified: Secondary | ICD-10-CM

## 2023-03-20 DIAGNOSIS — R053 Chronic cough: Secondary | ICD-10-CM

## 2023-03-20 DIAGNOSIS — J441 Chronic obstructive pulmonary disease with (acute) exacerbation: Secondary | ICD-10-CM

## 2023-03-20 MED ORDER — SODIUM CHLORIDE 3 % IN NEBU
3.0000 mL | INHALATION_SOLUTION | Freq: Two times a day (BID) | RESPIRATORY_TRACT | 11 refills | Status: DC
  Filled 2023-03-20: qty 750, 30d supply, fill #0

## 2023-03-20 NOTE — Progress Notes (Signed)
Synopsis: Referred for chronic cough by Irven Coe, MD  Subjective:   PATIENT ID: [name] GENDER: male DOB: [birth year], MRN: [id number]  Chief Complaint  Patient presents with   Follow-up    Breathing is about the same. He states has occ "violent coughing fits"- prod cough with clear to white sputum.   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 Never has lived anywhere else other than [place], [place]. Pets: No pets. No birds. No feather pillows or down comforters.  Travel: none pertinent  Had ablation for some form of SVT vs AF about 6 years ago.   Interval HPI:  Saw PCP for cough, dyspnea, got 5d course of prednisone and augmentin. Was doing a bit better on those medications.   Still getting coughing fits with a great deal of phlegm production.   Otherwise pertinent review of systems is negative.    Past Medical History:  Diagnosis Date   Abscess of sigmoid colon due to diverticulitis 12/26/2018   Appendicitis 09/05/2014   Basal cell carcinoma 10/04/2015   left deltoid-cx91fu   BCC (basal cell carcinoma) 11/03/2019   left shin (txpbx)   Colon polyp    Complication of anesthesia    Difficult to arouse, Combative x 1   COVID    Diverticulitis    Diverticulosis    extensive 3'15.   Dysrhythmia    hx. Ablation for tachycardia, no routine cardiology visits now   ED (erectile dysfunction)    Heart murmur    teenager   History of colon polyps    History of kidney stones    x1 passed   Hyperlipidemia    Myasthenia gravis  (HCC)    Neuromuscular disorder (HCC)    "neuroma"    Osteoarthritis    Palpitation    with svt rate 150   Squamous cell carcinoma of skin 10/04/2015   left cheek (cx58fu)   SUPRAVENTRICULAR TACHYCARDIA 01/11/2011   Qualifier: Diagnosis of  By: Ladona Ridgel, MD, Jerrell Mylar    Tendinitis    achiles heel spur   Testicular atrophy    Tubular adenoma of colon      Family History  Problem Relation Age of Onset   Pancreatic cancer Father    Stroke Mother    Aneurysm Brother    Allergies Brother    Neuropathy Neg Hx      Past Surgical History:  Procedure Laterality Date   CARDIAC ELECTROPHYSIOLOGY MAPPING AND ABLATION  2010   cardiac    COLONOSCOPY  11/2018   Dr Bosie Clos.  Many polyps.  rec f/u colonoscopy summer 2020   EUS N/A 11/17/2014   Procedure: ESOPHAGEAL ENDOSCOPIC ULTRASOUND (EUS) RADIAL;  Surgeon: Willis Modena, MD;  Location: [facility];  Service: Endoscopy;  Laterality: N/A;   FINE NEEDLE ASPIRATION N/A 11/17/2014   Procedure: FINE NEEDLE ASPIRATION (FNA) LINEAR;  Surgeon: Willis Modena, MD;  Location: [facility];  Service: Endoscopy;  Laterality: N/A;  + or- fna   FLEXIBLE SIGMOIDOSCOPY     HEMORRHOID SURGERY     banding  INGUINAL HERNIA REPAIR Bilateral 2005   '05 bilateral hernia   KNEE ARTHROSCOPY Left 2008   scope '08   LAPAROSCOPIC APPENDECTOMY N/A 09/05/2014   Procedure: APPENDECTOMY LAPAROSCOPIC;  Surgeon: Abigail Miyamoto, MD;  Location: [facility] OR;  Service: General;  Laterality: N/A;   PROCTOSCOPY N/A 12/26/2018   Procedure: RIGID PROCTOSCOPY;  Surgeon: Karie Soda, MD;  Location: [facility];  Service: General;  Laterality: N/A;   ROTATOR CUFF REPAIR Right 2016    Social History   Socioeconomic History   Marital status: Married    Spouse name: Not on file   Number of children: 3   Years of education: 14   Highest education level: Some college, no degree  Occupational History   Occupation: sales-Retired, works one day a week  Tobacco Use    Smoking status: Former    Packs/day: 1.00    Years: 47.00    Additional pack years: 0.00    Total pack years: 47.00    Types: Cigarettes    Quit date: 10/29/2000    Years since quitting: 22.4   Smokeless tobacco: Never  Vaping Use   Vaping Use: Never used  Substance and Sexual Activity   Alcohol use: Yes    Alcohol/week: 1.0 standard drink of alcohol    Types: 1 Standard drinks or equivalent per week    Comment: socially- nightly 1 cocktail   Drug use: No   Sexual activity: Not on file  Other Topics Concern   Not on file  Social History Narrative   Lives with wife in a one story home with a basement.  Has 3 daughters.  Works one day a week at the [employer].  Retired from Airline pilot.  Education: some college.    Caffeine use: decaf   Right handed   One story home   Social Determinants of Health   Financial Resource Strain: Not on file  Food Insecurity: Not on file  Transportation Needs: Not on file  Physical Activity: Not on file  Stress: Not on file  Social Connections: Not on file  Intimate Partner Violence: Not on file     Allergies  Allergen Reactions   Ambrosia Trifida (Tall Ragweed) Allergy Skin Test Anaphylaxis   Bee Pollen Itching    Nasal congestion   Beta Adrenergic Blockers     Other reaction(s): MG   Ciprofloxacin     Other reaction(s): myasthenia gravis   Eliquis [Apixaban]     Throat tightening   Erythromycin     Other reaction(s): MG   Pollen Extract Other (See Comments)    Nasal congestion     Outpatient Medications Prior to Visit  Medication Sig Dispense Refill   albuterol (VENTOLIN HFA) 108 (90 Base) MCG/ACT inhaler Inhale 1-2 puffs into the lungs every 6 (six) hours as needed for wheezing or shortness of breath. 18 g 5   atorvastatin (LIPITOR) 40 MG tablet TAKE 1 TABLET BY MOUTH EVERY DAY 90 tablet 3   calcium carbonate (TUMS - DOSED IN MG ELEMENTAL CALCIUM) 500 MG chewable tablet Chew 500 mg by mouth daily.     Cyanocobalamin (VITAMIN B-12 PO)  Take 5,000 mcg by mouth daily.      doxazosin (CARDURA) 8 MG tablet Take 8 mg by mouth daily.     finasteride (PROSCAR) 5 MG tablet 5 mg daily.     fluticasone (FLONASE) 50 MCG/ACT nasal spray Place 2 sprays into both nostrils daily.     fluticasone-salmeterol (WIXELA INHUB) 100-50 MCG/ACT AEPB Inhale 1 puff  into the lungs 2 (two) times daily. 1 each 11   glucosamine-chondroitin 500-400 MG tablet Take 1 tablet by mouth daily.     ipratropium (ATROVENT) 0.03 % nasal spray PLACE 1 SPRAY INTO BOTH NOSTRILS DAILY AS NEEDED FOR RHINITIS. 90 mL 1   metoprolol succinate (TOPROL-XL) 50 MG 24 hr tablet Take 1 tablet (50 mg total) by mouth daily. Please keep upcoming appt for future refills. 90 tablet 3   montelukast (SINGULAIR) 10 MG tablet Take 10 mg by mouth daily.     Multiple Vitamin (MULTIVITAMIN WITH MINERALS) TABS tablet Take 1 tablet by mouth daily.     predniSONE (DELTASONE) 1 MG tablet Take 2mg  daily with 5mg  tablet = 7mg  x 2 months, then 6mg  daily. 270 tablet 1   predniSONE (DELTASONE) 5 MG tablet Take 1 tablet (5 mg total) by mouth daily with breakfast. 90 tablet 3   pyridostigmine (MESTINON) 60 MG tablet Take 0.5 tablets (30 mg total) by mouth 3 (three) times daily. Take half tablet at 8am, 1pm, and 6pm 135 tablet 3   warfarin (COUMADIN) 5 MG tablet TAKE 1 TABLET (5 MG TOTAL) BY MOUTH DAILY. OR AS DIRECTED BY COUMADIN CLINIC _30 DAY SUPPLY 90 tablet 1   No facility-administered medications prior to visit.       Objective:   Physical Exam:  General appearance: 82 y.o., male, NAD, conversant  Eyes: anicteric sclerae, moist conjunctivae; no lid-lag; PERRL, tracking appropriately HENT: NCAT; oropharynx, MMM, no mucosal ulcerations; normal hard and soft palate Neck: Trachea midline; no lymphadenopathy, no JVD Lungs: very rhonchorous, with normal respiratory effort CV: RRR, no MRGs  Abdomen: Soft, non-tender; non-distended, BS present  Extremities: No peripheral edema, radial and DP  pulses present bilaterally  Skin: Normal temperature, turgor and texture; no rash Psych: Appropriate affect Neuro: Alert and oriented to person and place, no focal deficit    Vitals:   03/20/23 1104  BP: (!) 110/58  Pulse: 72  Temp: (!) 97.5 F (36.4 C)  TempSrc: Oral  SpO2: 94%  Weight: 202 lb 12.8 oz (92 kg)  Height: 6' (1.829 m)      94% on RA BMI Readings from Last 3 Encounters:  03/20/23 27.50 kg/m  03/08/23 27.12 kg/m  01/29/23 28.16 kg/m   Wt Readings from Last 3 Encounters:  03/20/23 202 lb 12.8 oz (92 kg)  03/08/23 200 lb (90.7 kg)  01/29/23 207 lb 9.6 oz (94.2 kg)     CBC    Component Value Date/Time   WBC 6.8 11/05/2021 0501   RBC 4.03 (L) 11/05/2021 0501   HGB 12.0 (L) 11/05/2021 0501   HGB 15.2 03/08/2016 1133   HGB 15.0 11/02/2014 1104   HCT 37.5 (L) 11/05/2021 0501   HCT 46.7 06/14/2018 1605   HCT 45.6 11/02/2014 1104   PLT 189 11/05/2021 0501   PLT 249 03/08/2016 1133   MCV 93.1 11/05/2021 0501   MCV 88 03/08/2016 1133   MCV 89.1 11/02/2014 1104   MCH 29.8 11/05/2021 0501   MCHC 32.0 11/05/2021 0501   RDW 14.3 11/05/2021 0501   RDW 14.0 03/08/2016 1133   RDW 13.9 11/02/2014 1104   LYMPHSABS 0.8 11/03/2021 1023   LYMPHSABS 1.2 11/02/2014 1104   MONOABS 0.9 11/03/2021 1023   MONOABS 0.5 11/02/2014 1104   EOSABS 0.1 11/03/2021 1023   EOSABS 0.2 11/02/2014 1104   BASOSABS 0.0 11/03/2021 1023   BASOSABS 0.0 11/02/2014 1104    Low level peripheral eosinophilia historically  Timothy grass, ragweed aeroallergen +  Chest Imaging: CTA Chest 04/2020 OSH report reviewed by me: no PE, +bilateral atelectasis, multiple hepatic hypodensities, small PCE   CT Chest 02/10/21: personally reviewed by me and remarkable for stable RUL gg nodule, lower lobe bronchiolitis R>L, secretions in RMB, RLL. Emphysema.  CXR 03/09/23 unchanged  Pulmonary Functions Testing Results:    Latest Ref Rng & Units 07/18/2021   11:54 AM  PFT Results  FVC-Pre L 3.79    FVC-Predicted Pre % 89   FVC-Post L 4.04   FVC-Predicted Post % 95   Pre FEV1/FVC % % 62   Post FEV1/FCV % % 62   FEV1-Pre L 2.34   FEV1-Predicted Pre % 77   FEV1-Post L 2.50   DLCO uncorrected ml/min/mmHg 19.80   DLCO UNC% % 77   DLCO corrected ml/min/mmHg 19.80   DLCO COR %Predicted % 77   DLVA Predicted % 89   TLC L 7.18   TLC % Predicted % 97   RV % Predicted % 123    07/18/21 PFT Reviewed by me ratio 62, post BD FEV1 82%, very mildly reduced diffusing capacitiy  Reviewed [facility] PFTs: 01/2020 FEV1/FVC 54, post BD FEV1 82.8% without BD response.     Echocardiogram:   05/03/20 [id number] PCE, age appropriate diastolic parameters     Assessment & Plan:   # Acute on chronic cough: has had trials of ppi/H2B. Probably multifactorial with COPD, UACS with persistent postnasal drip, possible component of LPR (despite negative trial of PPI) but would have expected more edematous appearing vocal cords/folds on laryngoscopy. Does additionally have bronchial wall thickening/borderline bronchiectasis and secretions in major airways, RLL changes that appear consistent with recurrent aspiration and MEP low which together with his MG raises question of weak/ineffective cough.    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   # MG: MEP was just below LLN. TLC, FVC WNL 02/04/20. Periodic surveillance spiro.  # RUL GG pulmonary nodule: stable since 2015. No further need for surveillance.  Plan: - check AFB, sputum culture - has completed reasonable steroid course and course of augmentin, now has resumed usual prednisone 5 mg daily - While dealing with chest congestion take mucinex 600 mg to 1200 mg twice daily to thin mucus and the following:  - take albuterol 2 puffs twice daily  - THEN hypertonic saline neb twice daily  - THEN flutter valve 10 slow but firm puffs twice daily - continue flonase and singulair for component of AR - we have talked extensively about value  of pursuing MBS/speech eval. Although he's no longer having episodes of frank aspiration with better MG control, his most recent CT Chest is suggestive of either ongoing microaspiration or difficulty with clearing secretions (weak cough) or both and he does have low MEP. He has declined speech eval.  - Declines LABA/ICS due to cost. Per Dr. Allena Katz (neurology) would prefer to avoid LAMA if possible.    RTC 3 months with Dr. Harriette Ohara, MD  Pulmonary Critical Care 03/20/2023 11:13 AM

## 2023-03-20 NOTE — Patient Instructions (Addendum)
-   AFB sputum, sputum culture - continue flonase for AR - While dealing with chest congestion take mucinex 600 mg to 1200 mg twice daily to thin mucus and the following:  - take albuterol 2 puffs twice daily  - THEN hypertonic saline neb twice daily  - THEN flutter valve 10 slow but firm puffs twice daily - if no better then give [place] a call to make appointment

## 2023-03-21 ENCOUNTER — Ambulatory Visit (INDEPENDENT_AMBULATORY_CARE_PROVIDER_SITE_OTHER): Payer: Medicare Other | Admitting: Podiatry

## 2023-03-21 ENCOUNTER — Encounter: Payer: Self-pay | Admitting: Podiatry

## 2023-03-21 ENCOUNTER — Other Ambulatory Visit: Payer: Self-pay | Admitting: Cardiology

## 2023-03-21 DIAGNOSIS — I4891 Unspecified atrial fibrillation: Secondary | ICD-10-CM

## 2023-03-21 DIAGNOSIS — M722 Plantar fascial fibromatosis: Secondary | ICD-10-CM | POA: Diagnosis not present

## 2023-03-21 DIAGNOSIS — M19071 Primary osteoarthritis, right ankle and foot: Secondary | ICD-10-CM

## 2023-03-21 DIAGNOSIS — M19072 Primary osteoarthritis, left ankle and foot: Secondary | ICD-10-CM

## 2023-03-21 MED ORDER — TRIAMCINOLONE ACETONIDE 40 MG/ML IJ SUSP
40.0000 mg | Freq: Once | INTRAMUSCULAR | Status: AC
Start: 2023-03-21 — End: 2023-03-21
  Administered 2023-03-21: 40 mg

## 2023-03-21 NOTE — Progress Notes (Signed)
He presents today for follow-up of his bilateral Planter fasciitis states that the the fasciitis is doing well but is hurting and swelling along the lateral side of the foot.  Objective: Vital signs stable oriented x 3 no reproducible pain on palpation medial [place] tubercle the majority of his pain however is located over the fourth fifth tarsometatarsal joints bilaterally with pitting edema of the legs and feet.  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  Plan: I injected these areas today with Kenalog and local anesthetic he will notify us with questions or concerns.

## 2023-03-21 NOTE — Telephone Encounter (Signed)
Warfarin 5mg  refill Afib Last INR 03/14/23 Last OV 03/08/23

## 2023-03-28 ENCOUNTER — Ambulatory Visit: Payer: Medicare Other | Attending: Cardiology

## 2023-03-28 DIAGNOSIS — Z7901 Long term (current) use of anticoagulants: Secondary | ICD-10-CM | POA: Diagnosis not present

## 2023-03-28 DIAGNOSIS — I4891 Unspecified atrial fibrillation: Secondary | ICD-10-CM | POA: Diagnosis not present

## 2023-03-28 DIAGNOSIS — Z5181 Encounter for therapeutic drug level monitoring: Secondary | ICD-10-CM | POA: Diagnosis not present

## 2023-03-28 LAB — POCT INR: INR: 1.9 — AB (ref 2.0–3.0)

## 2023-03-28 NOTE — Patient Instructions (Signed)
TAKE 1.5 TABLETS TODAY ONLY THEN  continue taking warfarin 1 tablet daily except for 1/2 a tablet on Fridays. Recheck INR in 3 weeks.  Call the Coumadin Clinic with any upcoming procedures or new medications.  [facility]in Clinic [phone]

## 2023-04-02 ENCOUNTER — Encounter: Payer: Self-pay | Admitting: Cardiology

## 2023-04-02 ENCOUNTER — Encounter: Payer: Self-pay | Admitting: Pulmonary Disease

## 2023-04-02 MED ORDER — METOPROLOL SUCCINATE ER 50 MG PO TB24: 50.0000 mg | ORAL_TABLET | Freq: Every day | ORAL | 3 refills | Status: DC

## 2023-04-02 NOTE — Telephone Encounter (Signed)
Per OV note on 03/08/23 by Skains:  Paroxysmal atrial fibrillation - Currently residing in sinus rhythm.  This was discovered on 05/10/2020 incidentally.  He did not tolerate the Eliquis or Xarelto, currently on Coumadin - Toprol-XL 50 mg was given to improve rate control.   No medication changes made during this visit and rx previously prescribed by [facility], will renew per request at this time.

## 2023-04-03 MED ORDER — ALBUTEROL SULFATE HFA 108 (90 BASE) MCG/ACT IN AERS: 1.0000 | INHALATION_SPRAY | Freq: Four times a day (QID) | RESPIRATORY_TRACT | 5 refills | Status: DC | PRN

## 2023-04-09 ENCOUNTER — Ambulatory Visit: Payer: Medicare Other | Admitting: Neurology

## 2023-04-16 ENCOUNTER — Ambulatory Visit (INDEPENDENT_AMBULATORY_CARE_PROVIDER_SITE_OTHER): Payer: Medicare Other | Admitting: Neurology

## 2023-04-16 ENCOUNTER — Encounter: Payer: Self-pay | Admitting: Neurology

## 2023-04-16 VITALS — BP 102/68 | HR 80 | Ht 72.0 in | Wt 203.0 lb

## 2023-04-16 DIAGNOSIS — M4306 Spondylolysis, lumbar region: Secondary | ICD-10-CM

## 2023-04-16 DIAGNOSIS — G7000 Myasthenia gravis without (acute) exacerbation: Secondary | ICD-10-CM

## 2023-04-16 MED ORDER — PREDNISONE 1 MG PO TABS: ORAL_TABLET | ORAL | 1 refills | Status: DC

## 2023-04-16 MED ORDER — PYRIDOSTIGMINE BROMIDE 60 MG PO TABS: 30.0000 mg | ORAL_TABLET | Freq: Three times a day (TID) | ORAL | 3 refills | Status: DC

## 2023-04-16 NOTE — Progress Notes (Signed)
Follow-up Visit   Date: 04/16/23    [name] MRN: [id number] DOB: 16-Sep-1941   Interim History: [name] is a 82 y.o. right-handed Caucasian male with hyperlipidemia, COPD, peripheral neuropathy, SVT s/p ablation, afib on anticoagulation, BPH, former smoker, and diverticulitis returning to the clinic for follow-up of myasthenia gravis.  The patient was accompanied to the clinic by wife.   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  Initially, hospitalized in Sept 2019, responsive to IVIG and prednisone 30mg /d.  Since this time, he has been on a tapering dose prednisone and tolerated it well.  He has been on prednisone 20mg  since 2020 with no further exacerbations.  No evidence of disease manifestation - Continue prednisone 7mg , plan to reduce in the fall  - If he has any breakthrough weakness during taper, increase back to 10mg  daily  - Continue mestinon 30mg  three daily.  OK to take extra 30mg  as needed for breakthrough symptoms.    2.  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 Modesto Charon at [facility]  Return to clinic in 3 months  ---------------------------------------------------------------------- History of present illness: Patient was diagnosed with myasthenia gravis in August 2019 after presenting with left ptosis, facial weakness, double vision, and dysphagia. He went to the ER for these symptoms where stroke was excluded and AChR antibodies returned positive. He was started on mestinon 30mg  three times daily (8am, 4pm, and midnight).  He was started on prednisone 20mg , with no improvement.  He was hospitalized from 9/19 - 07/23/2018 with MG exacerbated and treated with IVIG with great response. His prednisone was increased to 30mg  daily and he was started on IVIG, which helped control MG symptoms.  During 2020, his prednisone was tapered and he has been on prednisone 10mg  daily.    He also complains of severe neck pain which is worse with prolonged standing and neck extension.  He usually has to sit back down and rest for relief. He does not have numbness/tingling of the hands or weakness.    He has previously been evaluated for neuropathy at [facility] and [facility] with NCS/EMG which showed peripheral sensorimotor axonal neuropathy.  CSF testing was normal and genetic testing was normal.    UPDATE 05/25/2021:   He was at the beach last week and did well in the hot temperatures. His wife states he has rare spells of difficulty swallowing, but also feels he may not be chewing his food.  He is compliant with prednisone 10mg  daily and mestinon 30mg  three times daily.  UPDATE 09/17/2022:  He is on prednisone 8mg  since the beginning of the month and denies any new weakness.  UPDATE 01/01/2023:  He is here for follow-up visit.  He remains on prednisone 8mg .  He is tolerating this well and denies weakness, droopy eyelids, or double vision.   Wife says that after eating his food, he tends to cough for some time, but there is no choking.  He has a few areas on his skin resected for precancerous lesions and is doing well.   UPDATE 04/16/2023:  He is here for follow-up visit.  He has been on prednisone 7mg  for the past several months and doing very well.  He denies weakness, vision change, or ptosis.    Medications:  Current Outpatient Medications on File Prior to Visit  Medication Sig Dispense Refill   albuterol (VENTOLIN HFA)  108 (90 Base) MCG/ACT inhaler Inhale 1-2 puffs into the lungs every 6 (six) hours as needed for wheezing or shortness of breath. 18 g 5   atorvastatin (LIPITOR) 40 MG tablet TAKE 1 TABLET BY MOUTH EVERY DAY 90 tablet 3   calcium carbonate (TUMS - DOSED IN MG ELEMENTAL CALCIUM) 500 MG chewable tablet Chew 500 mg by mouth daily.     Cyanocobalamin (VITAMIN B-12 PO) Take 5,000 mcg by  mouth daily.      doxazosin (CARDURA) 8 MG tablet Take 8 mg by mouth daily.     finasteride (PROSCAR) 5 MG tablet 5 mg daily.     fluticasone (FLONASE) 50 MCG/ACT nasal spray Place 2 sprays into both nostrils daily.     fluticasone-salmeterol (WIXELA INHUB) 100-50 MCG/ACT AEPB Inhale 1 puff into the lungs 2 (two) times daily. 1 each 11   glucosamine-chondroitin 500-400 MG tablet Take 1 tablet by mouth daily.     ipratropium (ATROVENT) 0.03 % nasal spray PLACE 1 SPRAY INTO BOTH NOSTRILS DAILY AS NEEDED FOR RHINITIS. 90 mL 1   metoprolol succinate (TOPROL-XL) 50 MG 24 hr tablet Take 1 tablet (50 mg total) by mouth daily. 90 tablet 3   montelukast (SINGULAIR) 10 MG tablet Take 10 mg by mouth daily.     Multiple Vitamin (MULTIVITAMIN WITH MINERALS) TABS tablet Take 1 tablet by mouth daily.     predniSONE (DELTASONE) 1 MG tablet Take 2mg  daily with 5mg  tablet = 7mg  x 2 months, then 6mg  daily. 270 tablet 1   predniSONE (DELTASONE) 5 MG tablet Take 1 tablet (5 mg total) by mouth daily with breakfast. 90 tablet 3   pyridostigmine (MESTINON) 60 MG tablet Take 0.5 tablets (30 mg total) by mouth 3 (three) times daily. Take half tablet at 8am, 1pm, and 6pm 135 tablet 3   sodium chloride HYPERTONIC 3 % nebulizer solution Take 3 mLs by nebulization 2 (two) times daily. 750 mL 11   warfarin (COUMADIN) 5 MG tablet TAKE 1 TABLET BY MOUTH DAILY OR AS DIRECTED BY COUMADIN CLINIC 90 tablet 1   No current facility-administered medications on file prior to visit.    Allergies:  Allergies  Allergen Reactions   Ambrosia Trifida (Tall Ragweed) Allergy Skin Test Anaphylaxis   Bee Pollen Itching    Nasal congestion   Beta Adrenergic Blockers     Other reaction(s): MG   Ciprofloxacin     Other reaction(s): myasthenia gravis   Eliquis [Apixaban]     Throat tightening   Erythromycin     Other reaction(s): MG   Pollen Extract Other (See Comments)    Nasal congestion    Vital Signs:  BP 102/68   Pulse 80    Ht 6' (1.829 m)   Wt 203 lb (92.1 kg)   SpO2 92%   BMI 27.53 kg/m    Neurological Exam: MENTAL STATUS including orientation to time, place, person, recent and remote memory, attention span and concentration, language, and fund of knowledge is normal.  Speech is not dysarthric.  CRANIAL NERVES:   Normal conjugate, extra-ocular eye movements in all directions of gaze.  Mild ptosis (stable)  Face is symmetric.  Orbicularis oculi, buccinator, and orbicularis oris is 5/5.  Tongue strength is 5/5  MOTOR:  Motor strength is 5/5 in all extremities.  Neck flexion is 5/5.   COORDINATION/GAIT:    Gait narrow based, stooped, mildly antalgic appearing. Unassisted, stable.   Data: Labs 06/15/2018:  AChR binding 1.69*, MUSK neg   MRI brain  wo contrast 06/14/2018: Negative for acute infarct. 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   CTA head and neck 06/14/2018: 1. Negative for large vessel occlusion, dissection, or arterial stenosis in the head or neck. There is minimal atherosclerosis. 2. Positive for generalized arterial tortuosity. Ectatic distal aortic arch and proximal descending aorta. 3. A small 15 mm area of ground-glass opacity in the right upper lobe appears stable since 2015. 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   NCS/EMG of the legs 03/13/2018:  This is an abnormal study. 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   CT chest 07/11/2018:  Negative for thymoma  MRI cervical spine wo contrast 07/14/2018: 1. No acute finding. 2. Disc and facet degeneration causes foraminal impingement that is advanced on the left at c3-4 and on the left at C5-6. 3. Mild right foraminal narrowing at C5-6 and C6-7.    Thank you for allowing me to participate in patient's care.  If I can answer any additional questions, I would be pleased to do so.     Sincerely,    Melany Wiesman K. Allena Katz, DO

## 2023-04-16 NOTE — Patient Instructions (Signed)
Continue prednisone 7mg  daily  Continue mestinon 30mg  three times daily  Enjoy your summer!  See you back in 3 months

## 2023-04-18 ENCOUNTER — Ambulatory Visit: Payer: Medicare Other | Attending: Cardiology | Admitting: *Deleted

## 2023-04-18 DIAGNOSIS — Z7901 Long term (current) use of anticoagulants: Secondary | ICD-10-CM | POA: Insufficient documentation

## 2023-04-18 DIAGNOSIS — I4891 Unspecified atrial fibrillation: Secondary | ICD-10-CM

## 2023-04-18 DIAGNOSIS — Z5181 Encounter for therapeutic drug level monitoring: Secondary | ICD-10-CM | POA: Diagnosis not present

## 2023-04-18 LAB — POCT INR: POC INR: 2.1

## 2023-04-18 NOTE — Patient Instructions (Signed)
Description   Continue taking warfarin 1 tablet daily except for 1/2 a tablet on Fridays. Recheck INR in 4 weeks.  Call the Coumadin Clinic with any upcoming procedures or new medications.  Coumadin Clinic [phone]

## 2023-05-16 ENCOUNTER — Ambulatory Visit: Payer: Medicare Other | Attending: Cardiology | Admitting: *Deleted

## 2023-05-16 DIAGNOSIS — Z5181 Encounter for therapeutic drug level monitoring: Secondary | ICD-10-CM | POA: Diagnosis not present

## 2023-05-16 DIAGNOSIS — Z7901 Long term (current) use of anticoagulants: Secondary | ICD-10-CM | POA: Diagnosis not present

## 2023-05-16 DIAGNOSIS — I4891 Unspecified atrial fibrillation: Secondary | ICD-10-CM | POA: Diagnosis not present

## 2023-05-16 LAB — POCT INR: INR: 2 (ref 2.0–3.0)

## 2023-05-16 NOTE — Patient Instructions (Signed)
Description   Today take 1.5 tablets of warfarin then continue taking warfarin 1 tablet daily except for 1/2 tablet on Fridays. Recheck INR in 5 weeks.  Call the Coumadin Clinic with any upcoming procedures or new medications.  Coumadin Clinic [phone]

## 2023-05-21 ENCOUNTER — Ambulatory Visit (INDEPENDENT_AMBULATORY_CARE_PROVIDER_SITE_OTHER): Payer: Medicare Other | Admitting: Podiatry

## 2023-05-21 DIAGNOSIS — M19072 Primary osteoarthritis, left ankle and foot: Secondary | ICD-10-CM | POA: Diagnosis not present

## 2023-05-21 DIAGNOSIS — M19071 Primary osteoarthritis, right ankle and foot: Secondary | ICD-10-CM

## 2023-05-21 DIAGNOSIS — M778 Other enthesopathies, not elsewhere classified: Secondary | ICD-10-CM

## 2023-05-21 NOTE — Progress Notes (Signed)
He presents today for bilateral foot pain states that he is actually doing very well everywhere else it but hurts right and here as he refers to the first intermetatarsal space.  He states that he no longer has any Planter fasciitis and the lateral aspect of the foot is doing much better.  Objective: Vital signs are stable he is alert and oriented x 3 no reproducible pain with exception of frontal plane range of motion and on palpation of the proximalmost aspect of the first intermetatarsal space.  Assessment capsulitis bursitis first intermetatarsal space bilaterally.  Plan: Discussed etiology pathology conservative versus surgical therapies at this point I injected the area and he tolerated this procedure well we discussed appropriate shoe gear and I will follow-up with him in the near future.  He was injected with 10 mg of Kenalog and local anesthetic to the point of maximal tenderness.

## 2023-05-23 ENCOUNTER — Encounter: Payer: Self-pay | Admitting: Podiatry

## 2023-06-18 ENCOUNTER — Other Ambulatory Visit: Payer: Self-pay | Admitting: Cardiology

## 2023-06-20 ENCOUNTER — Ambulatory Visit: Payer: Medicare Other | Attending: Cardiovascular Disease

## 2023-06-20 DIAGNOSIS — Z7901 Long term (current) use of anticoagulants: Secondary | ICD-10-CM | POA: Diagnosis not present

## 2023-06-20 DIAGNOSIS — I4891 Unspecified atrial fibrillation: Secondary | ICD-10-CM

## 2023-06-20 DIAGNOSIS — Z5181 Encounter for therapeutic drug level monitoring: Secondary | ICD-10-CM

## 2023-06-20 LAB — POCT INR: INR: 2.2 (ref 2.0–3.0)

## 2023-06-20 NOTE — Patient Instructions (Signed)
Description   Continue taking warfarin 1 tablet daily except for 1/2 tablet on Fridays. Recheck INR in 6 weeks.  Call the Coumadin Clinic with any upcoming procedures or new medications.  Coumadin Clinic [phone]

## 2023-06-24 DIAGNOSIS — R600 Localized edema: Secondary | ICD-10-CM | POA: Diagnosis not present

## 2023-06-24 DIAGNOSIS — R609 Edema, unspecified: Secondary | ICD-10-CM | POA: Diagnosis not present

## 2023-06-25 ENCOUNTER — Encounter: Payer: Self-pay | Admitting: Neurology

## 2023-06-27 ENCOUNTER — Ambulatory Visit: Payer: Medicare Other | Admitting: Pulmonary Disease

## 2023-06-27 ENCOUNTER — Encounter: Payer: Self-pay | Admitting: Pulmonary Disease

## 2023-06-27 VITALS — BP 120/74 | HR 68 | Ht 70.0 in | Wt 206.2 lb

## 2023-06-27 DIAGNOSIS — R053 Chronic cough: Secondary | ICD-10-CM | POA: Diagnosis not present

## 2023-06-27 NOTE — Patient Instructions (Signed)
Nice to meet you  No changes to medication today  Be on the look out for increased cough, and increased sputum production, change in color or quality of the sputum, fatigue, lack of appetite, increased "rattling" of the chest as signs we may need to call in antibiotics in the future.  Continue to flutter valve as you are I think it is helping keeping the mucus moving  I will take out the saline solution for now, we may need to consider reevaluating the need for nebulizer machine and this in the future to help thin out mucous, to make it easier to get out of cough it out.  Return to clinic in 3 months or sooner as needed

## 2023-06-28 ENCOUNTER — Encounter: Payer: Self-pay | Admitting: Pulmonary Disease

## 2023-06-28 NOTE — Progress Notes (Signed)
Synopsis: Referred for chronic cough by Irven Coe, MD  Subjective:   PATIENT ID: [name] GENDER: male DOB: [birth year], MRN: [id number]  Chief Complaint  Patient presents with   Consult    Pt states the weather and allergies has been causing flare ups. Does take otc allergy meds, pt states he does have a albuterol inhaler but no other inhaler.    Follow-up  CC: cough  82 y.o. 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 Never has lived anywhere else other than [place], [place]. Pets: No pets. No birds. No feather pillows or down comforters.  Travel: none pertinent  Had ablation for some form of SVT vs AF about 6 years ago.   Interval HPI:  Overall cough seems improved.  Got a course of Augmentin a few months ago.  Seem to clear up some of the cough.  In addition with rattling particular at night when he lies supine is improved.  He reports good adherence to airway clearance.  Discussed at length the multiple etiologies of his cough, the strategies at treating each.  Currently focus on airway clearance and bronchiectasis which seems to improved baseline cough.  Discussion regarding intermittent use of antibiotics.  Also discussed treatment for possible underlying reactive airway disease, chronic bronchitis, asthma with inhalers in the future.  Given the overall improved control they wish to add no further medicines at this time which is reasonable.    Past Medical History:  Diagnosis Date   Abscess of sigmoid colon due to diverticulitis 12/26/2018   Appendicitis 09/05/2014   Basal cell  carcinoma 10/04/2015   left [id number]   BCC (basal cell carcinoma) 11/03/2019   left shin (txpbx)   Colon polyp    Complication of anesthesia    Difficult to arouse, Combative x 1   COVID    Diverticulitis    Diverticulosis    extensive 3'15.   Dysrhythmia    hx. 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  Many polyps.  rec f/u colonoscopy summer 2020   EUS N/A 11/17/2014   Procedure: ESOPHAGEAL ENDOSCOPIC ULTRASOUND (EUS) RADIAL;  Surgeon: Willis Modena, MD;  Location: [facility];  Service: Endoscopy;  Laterality: N/A;   FINE NEEDLE ASPIRATION N/A 11/17/2014   Procedure: FINE NEEDLE ASPIRATION (FNA) LINEAR;  Surgeon: Willis Modena, MD;  Location: [facility];  Service: Endoscopy;  Laterality: N/A;  + or- fna   FLEXIBLE SIGMOIDOSCOPY     HEMORRHOID SURGERY     banding    INGUINAL HERNIA REPAIR Bilateral 2005   '05 bilateral hernia   KNEE ARTHROSCOPY Left 2008   scope '08   LAPAROSCOPIC APPENDECTOMY N/A 09/05/2014   Procedure: APPENDECTOMY LAPAROSCOPIC;  Surgeon: Abigail Miyamoto, MD;  Location: [facility];   Service: General;  Laterality: N/A;   PROCTOSCOPY N/A 12/26/2018   Procedure: RIGID PROCTOSCOPY;  Surgeon: Karie Soda, MD;  Location: [facility];  Service: General;  Laterality: N/A;   ROTATOR CUFF REPAIR Right 2016    Social History   Socioeconomic History   Marital status: Married    Spouse name: Not on file   Number of children: 3   Years of education: 14   Highest education level: Some college, no degree  Occupational History   Occupation: sales-Retired, works one day a week  Tobacco Use   Smoking status: Former    Current packs/day: 0.00    Average packs/day: 1 pack/day for 47.0 years (47.0 ttl pk-yrs)    Types: Cigarettes    Start date: 10/29/1953    Quit date: 10/29/2000    Years since quitting: 22.6   Smokeless tobacco: Never  Vaping Use   Vaping status: Never Used  Substance and Sexual Activity   Alcohol use: Yes    Alcohol/week: 1.0 standard drink of alcohol    Types: 1 Standard drinks or equivalent per week    Comment: socially- nightly 1 cocktail   Drug use: No   Sexual activity: Not on file  Other Topics Concern   Not on file  Social History Narrative   Lives with wife in a one story home with a basement.  Has 3 daughters.  Works one day a week at the [employer].  Retired from Airline pilot.  Education: some college.    Caffeine use: decaf   Right handed   One story home   Social Determinants of Health   Financial Resource Strain: Low Risk  (05/04/2020)   Received from [facility]   Overall Financial Resource Strain (CARDIA)    Difficulty of Paying Living Expenses: Not hard at all  Food Insecurity: No Food Insecurity (05/04/2020)   [facility] from [facility]   Hunger Vital Sign    Worried About Running Out of Food in the Last Year: Never true    Ran Out of Food in the Last Year: Never true  Transportation Needs: No Transportation Needs (05/04/2020)   Received from [facility]   [employer] - Administrator, Civil Service (Medical): No    Lack of Transportation (Non-Medical): No  Physical Activity: Not on file  Stress: Not on file  Social Connections: Moderately Isolated (05/04/2020)   [facility]   Social Connection and Isolation Panel [NHANES]    Frequency of Communication with Friends and Family: More than three times a week    Frequency of Social Gatherings with Friends and Family: Twice a week    Attends Religious  Services: Never    Database administrator or Organizations: No    Attends Engineer, structural: Never    Marital Status: Married  Catering manager Violence: Not on file     Allergies  Allergen Reactions   Ambrosia Trifida (Tall Ragweed) Allergy Skin Test Anaphylaxis   Bee Pollen Itching    Nasal congestion   Beta Adrenergic Blockers     Other reaction(s): MG   Ciprofloxacin     Other reaction(s): myasthenia gravis   Eliquis [Apixaban]     Throat tightening   Erythromycin     Other reaction(s): MG   Pollen Extract Other (See Comments)    Nasal congestion     Outpatient Medications Prior to Visit  Medication Sig Dispense Refill   albuterol (VENTOLIN HFA) 108 (90 Base) MCG/ACT inhaler Inhale 1-2 puffs into the lungs every 6 (six) hours as needed for wheezing or shortness of breath. 18 g 5   atorvastatin (LIPITOR) 40 MG tablet TAKE 1 TABLET BY MOUTH EVERY DAY 90 tablet 3   calcium carbonate (TUMS - DOSED IN MG ELEMENTAL CALCIUM) 500 MG chewable tablet Chew 500 mg by mouth daily.     Cyanocobalamin (VITAMIN B-12 PO) Take 5,000 mcg by mouth daily.      doxazosin (CARDURA) 8 MG tablet Take 8 mg by mouth daily.     finasteride (PROSCAR) 5 MG tablet 5 mg daily.     fluticasone (FLONASE) 50 MCG/ACT nasal spray Place 2 sprays into both nostrils daily.     glucosamine-chondroitin 500-400 MG tablet Take 1 tablet by mouth daily.     ipratropium  (ATROVENT) 0.03 % nasal spray PLACE 1 SPRAY INTO BOTH NOSTRILS DAILY AS NEEDED FOR RHINITIS. 90 mL 1   metoprolol succinate (TOPROL-XL) 50 MG 24 hr tablet Take 1 tablet (50 mg total) by mouth daily. 90 tablet 3   montelukast (SINGULAIR) 10 MG tablet Take 10 mg by mouth daily.     Multiple Vitamin (MULTIVITAMIN WITH MINERALS) TABS tablet Take 1 tablet by mouth daily.     predniSONE (DELTASONE) 1 MG tablet Take 2mg  daily with 5mg  tablet = 7mg  x 2 months, then 6mg  daily. 270 tablet 1   predniSONE (DELTASONE) 5 MG tablet Take 1 tablet (5 mg total) by mouth daily with breakfast. 90 tablet 3   pyridostigmine (MESTINON) 60 MG tablet Take 0.5 tablets (30 mg total) by mouth 3 (three) times daily. Take half tablet at 8am, 1pm, and 6pm 135 tablet 3   warfarin (COUMADIN) 5 MG tablet TAKE 1 TABLET BY MOUTH DAILY OR AS DIRECTED BY COUMADIN CLINIC 90 tablet 1   fluticasone-salmeterol (WIXELA INHUB) 100-50 MCG/ACT AEPB Inhale 1 puff into the lungs 2 (two) times daily. 1 each 11   sodium chloride HYPERTONIC 3 % nebulizer solution Take 3 mLs by nebulization 2 (two) times daily. 750 mL 11   No facility-administered medications prior to visit.       Objective:   Physical Exam:  General appearance: 82 y.o., male, NAD, conversant  Eyes: anicteric sclerae, moist conjunctivae; no lid-lag; PERRL, tracking appropriately HENT: NCAT; oropharynx, MMM, no mucosal ulcerations; normal hard and soft palate Neck: Trachea midline; no lymphadenopathy, no JVD Lungs: very rhonchorous, with normal respiratory effort CV: RRR, no MRGs  Abdomen: Soft, non-tender; non-distended, BS present  Extremities: No peripheral edema, radial and DP pulses present bilaterally  Skin: Normal temperature, turgor and texture; no rash Psych: Appropriate affect Neuro: Alert and oriented to person and place, no focal deficit  Vitals:   06/27/23 1040  BP: 120/74  Pulse: 68  SpO2: 93%  Weight: 206 lb 3.2 oz (93.5 kg)  Height: 5\' 10"   (1.778 m)      93% on RA BMI Readings from Last 3 Encounters:  06/27/23 29.59 kg/m  04/16/23 27.53 kg/m  03/20/23 27.50 kg/m   Wt Readings from Last 3 Encounters:  06/27/23 206 lb 3.2 oz (93.5 kg)  04/16/23 203 lb (92.1 kg)  03/20/23 202 lb 12.8 oz (92 kg)     CBC    Component Value Date/Time   WBC 6.8 11/05/2021 0501   RBC 4.03 (L) 11/05/2021 0501   HGB 12.0 (L) 11/05/2021 0501   HGB 15.2 03/08/2016 1133   HGB 15.0 11/02/2014 1104   HCT 37.5 (L) 11/05/2021 0501   HCT 46.7 06/14/2018 1605   HCT 45.6 11/02/2014 1104   PLT 189 11/05/2021 0501   PLT 249 03/08/2016 1133   MCV 93.1 11/05/2021 0501   MCV 88 03/08/2016 1133   MCV 89.1 11/02/2014 1104   MCH 29.8 11/05/2021 0501   MCHC 32.0 11/05/2021 0501   RDW 14.3 11/05/2021 0501   RDW 14.0 03/08/2016 1133   RDW 13.9 11/02/2014 1104   LYMPHSABS 0.8 11/03/2021 1023   LYMPHSABS 1.2 11/02/2014 1104   MONOABS 0.9 11/03/2021 1023   MONOABS 0.5 11/02/2014 1104   EOSABS 0.1 11/03/2021 1023   EOSABS 0.2 11/02/2014 1104   BASOSABS 0.0 11/03/2021 1023   BASOSABS 0.0 11/02/2014 1104    Low level peripheral eosinophilia historically  Timothy grass, ragweed aeroallergen +  Chest Imaging: CTA Chest 04/2020 OSH report reviewed by me: no PE, +bilateral atelectasis, multiple hepatic hypodensities, small PCE   CT Chest 02/10/21: personally reviewed by me and remarkable for stable RUL gg nodule, lower lobe bronchiolitis R>L, secretions in RMB, RLL. Emphysema.  CXR 03/09/23 unchanged  Pulmonary Functions Testing Results:    Latest Ref Rng & Units 07/18/2021   11:54 AM  PFT Results  FVC-Pre L 3.79   FVC-Predicted Pre % 89   FVC-Post L 4.04   FVC-Predicted Post % 95   Pre FEV1/FVC % % 62   Post FEV1/FCV % % 62   FEV1-Pre L 2.34   FEV1-Predicted Pre % 77   FEV1-Post L 2.50   DLCO uncorrected ml/min/mmHg 19.80   DLCO UNC% % 77   DLCO corrected ml/min/mmHg 19.80   DLCO COR %Predicted % 77   DLVA Predicted % 89   TLC L  7.18   TLC % Predicted % 97   RV % Predicted % 123    07/18/21 PFT Reviewed by me ratio 62 mild obstruction, lung volumes with air trapping, very mildly reduced diffusing capacitiy  Reviewed [facility] PFTs: 01/2020 FEV1/FVC 54, post BD FEV1 82.8% without BD response.     Echocardiogram:   05/03/20 [id number] PCE, age appropriate diastolic parameters     Assessment & Plan:   Chronic cough: has had trials of ppi/H2B. Suspect multifactorial with COPD, UACS with persistent postnasal drip, possible component of LPR (despite negative trial of PPI) but would have expected more edematous appearing vocal cords/folds on laryngoscopy, mild bronchiectasis. RLL changes that appear consistent with recurrent aspiration and MEP low which together with his MG raises question of weak/ineffective cough. Has responded to antibiotics in pat. Overall, better than baseline. Offered trial of ICS/LABA ( prescribed Wixela but never used) but he declines. Per Dr. Allena Katz (neurology) would prefer to avoid LAMA if possible with MG.  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   MG: MEP was just below LLN. TLC, FVC WNL 02/04/20. Periodic surveillance spiro.  RUL GG pulmonary nodule: stable since 2015. No further need for surveillance.   RTC 3 months with Dr. Judeth Horn  I spent 42 minutes in the care of the patient including face-to-face visit, coordination of records.  Karren Burly, MD [facility] 06/28/2023 10:27 AM

## 2023-07-08 DIAGNOSIS — R972 Elevated prostate specific antigen [PSA]: Secondary | ICD-10-CM | POA: Diagnosis not present

## 2023-07-10 ENCOUNTER — Other Ambulatory Visit: Payer: Self-pay | Admitting: Neurology

## 2023-07-11 DIAGNOSIS — R609 Edema, unspecified: Secondary | ICD-10-CM | POA: Diagnosis not present

## 2023-07-11 DIAGNOSIS — G8929 Other chronic pain: Secondary | ICD-10-CM | POA: Diagnosis not present

## 2023-07-11 DIAGNOSIS — J449 Chronic obstructive pulmonary disease, unspecified: Secondary | ICD-10-CM | POA: Diagnosis not present

## 2023-07-11 DIAGNOSIS — G7000 Myasthenia gravis without (acute) exacerbation: Secondary | ICD-10-CM | POA: Diagnosis not present

## 2023-07-11 DIAGNOSIS — E785 Hyperlipidemia, unspecified: Secondary | ICD-10-CM | POA: Diagnosis not present

## 2023-07-11 DIAGNOSIS — I4891 Unspecified atrial fibrillation: Secondary | ICD-10-CM | POA: Diagnosis not present

## 2023-07-11 DIAGNOSIS — N400 Enlarged prostate without lower urinary tract symptoms: Secondary | ICD-10-CM | POA: Diagnosis not present

## 2023-07-11 DIAGNOSIS — J309 Allergic rhinitis, unspecified: Secondary | ICD-10-CM | POA: Diagnosis not present

## 2023-07-15 DIAGNOSIS — R972 Elevated prostate specific antigen [PSA]: Secondary | ICD-10-CM | POA: Diagnosis not present

## 2023-07-15 DIAGNOSIS — R351 Nocturia: Secondary | ICD-10-CM | POA: Diagnosis not present

## 2023-07-15 DIAGNOSIS — N401 Enlarged prostate with lower urinary tract symptoms: Secondary | ICD-10-CM | POA: Diagnosis not present

## 2023-07-15 DIAGNOSIS — N210 Calculus in bladder: Secondary | ICD-10-CM | POA: Diagnosis not present

## 2023-07-16 ENCOUNTER — Ambulatory Visit (INDEPENDENT_AMBULATORY_CARE_PROVIDER_SITE_OTHER): Payer: Medicare Other | Admitting: Podiatry

## 2023-07-16 ENCOUNTER — Encounter: Payer: Self-pay | Admitting: Podiatry

## 2023-07-16 DIAGNOSIS — M7989 Other specified soft tissue disorders: Secondary | ICD-10-CM

## 2023-07-16 NOTE — Progress Notes (Signed)
He presents today for follow-up of his capsulitis bilateral forefoot.  He states that the feet are a lot more swollen than they were and they have been burning and tingling since last time he was here where he had bruising across the dorsal aspect at the metatarsal phalangeal joints.  Has recently seen his primary care provider who started him on an oral fluid pill and potassium.  Objective: Vital signs stable oriented x 3.  Pulses are palpable.  There is no erythema cellulitis drainage or odor he does have hammertoe deformities and considerable amount of edema bilateral.  Pain on palpation of the intermetatarsal spaces dorsally and plantarly.  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  Plan: I recommended that he utilize his compression hose at home continue his fluid pills he will follow-up with me in about 1 month

## 2023-07-31 DIAGNOSIS — Z23 Encounter for immunization: Secondary | ICD-10-CM | POA: Diagnosis not present

## 2023-08-01 ENCOUNTER — Ambulatory Visit: Payer: Medicare Other | Attending: Cardiology | Admitting: *Deleted

## 2023-08-01 DIAGNOSIS — Z5181 Encounter for therapeutic drug level monitoring: Secondary | ICD-10-CM | POA: Diagnosis not present

## 2023-08-01 DIAGNOSIS — Z7901 Long term (current) use of anticoagulants: Secondary | ICD-10-CM | POA: Diagnosis not present

## 2023-08-01 DIAGNOSIS — I4891 Unspecified atrial fibrillation: Secondary | ICD-10-CM | POA: Diagnosis not present

## 2023-08-01 LAB — POCT INR: INR: 1.8 — AB (ref 2.0–3.0)

## 2023-08-01 NOTE — Patient Instructions (Signed)
Description   Today take 1.5 tablets of warfarin then continue taking warfarin 1 tablet daily except for 1/2 tablet on Fridays. Recheck INR in 5 weeks.  Call the Coumadin Clinic with any upcoming procedures or new medications.  Coumadin Clinic [phone]

## 2023-08-12 ENCOUNTER — Ambulatory Visit: Payer: Medicare Other | Admitting: Neurology

## 2023-08-15 ENCOUNTER — Encounter: Payer: Self-pay | Admitting: Podiatrist

## 2023-08-15 ENCOUNTER — Ambulatory Visit: Payer: Medicare Other | Admitting: Podiatrist

## 2023-08-15 DIAGNOSIS — M7750 Other enthesopathy of unspecified foot: Secondary | ICD-10-CM

## 2023-08-15 DIAGNOSIS — M7751 Other enthesopathy of right foot: Secondary | ICD-10-CM | POA: Diagnosis not present

## 2023-08-15 DIAGNOSIS — M7752 Other enthesopathy of left foot: Secondary | ICD-10-CM | POA: Diagnosis not present

## 2023-08-15 MED ORDER — TRIAMCINOLONE ACETONIDE 10 MG/ML IJ SUSP
10.0000 mg | Freq: Once | INTRAMUSCULAR | Status: AC
Start: 2023-08-15 — End: ?

## 2023-08-15 NOTE — Progress Notes (Signed)
  Swelling down.  Chief Complaint  Patient presents with   capsulitis    BILAT, HASN'T GOTTEN BETTER, PCP GAVE MEDICATION FOR HIS EDEMA      HPI: Patient is 82 y.o. male who presents today for follow-up of bilateral foot pain.  He states his edema has improved since he has been on the Lasix medication he still has pain - lateral aspect of the left foot and medial aspect of the right foot.   Also some pain third mpj dorsally bilaterally.  Relates he randomly gets bruising in this area at times.  Relates it happens from time to time with no precipitating event.    Allergies  Allergen Reactions   Ambrosia Trifida (Tall Ragweed) Allergy Skin Test Anaphylaxis   Bee Pollen Itching    Nasal congestion   Beta Adrenergic Blockers     Other reaction(s): MG   Ciprofloxacin     Other reaction(s): myasthenia gravis   Eliquis [Apixaban]     Throat tightening   Erythromycin     Other reaction(s): MG   Pollen Extract Other (See Comments)    Nasal congestion    Review of systems is negative except as noted in the HPI.  Denies nausea/ vomiting/ fevers/ chills or night sweats.   Denies difficulty breathing, denies calf pain or tenderness  Physical Exam  Patient is awake, alert, and oriented x 3.  In no acute distress.    Vascular status is intact with palpable pedal pulses DP and PT bilateral and capillary refill time less than 3 seconds bilateral.  No edema or erythema noted.   Neurological exam reveals epicritic and protective sensation grossly intact bilateral.   Dermatological exam reveals skin is supple and dry to bilateral feet.  No open lesions present.  No bruising is seen in the midfoot at today's visit.  Swelling has decreased significantly from his last visit  Musculoskeletal exam: Musculature intact with dorsiflexion, plantarflexion, inversion, eversion.  Pain along the posterior tibial tendon on the right foot is noted at its insertion site.  Pain on the left foot along the peroneal  tendon and its insertion site on the fifth metatarsal base is also noted.  Generalized pain at the third metatarsal phalangeal joint bilateral is also identified but he relates it is not bothersome at today's visit.    Assessment:   ICD-10-CM   1. Tendonitis of ankle or foot  M77.50 triamcinolone acetonide (KENALOG) 10 MG/ML injection 10 mg       Plan: Discussed treatment options and alternatives.  At today's visit recommended injections which were carried out on the left foot in the right foot at the area of maximal tenderness utilizing 10 mg of Kenalog and Marcaine plain mixed.  He tolerated these well both injections were careful to avoid the tendon insertion itself on both feet.  Dispensed compression anklets for him to use as well bilaterally.  He will be back to see Dr. Al Corpus in 4 weeks and will call sooner if needed

## 2023-08-27 ENCOUNTER — Ambulatory Visit (INDEPENDENT_AMBULATORY_CARE_PROVIDER_SITE_OTHER): Payer: Medicare Other | Admitting: Neurology

## 2023-08-27 ENCOUNTER — Encounter: Payer: Self-pay | Admitting: Neurology

## 2023-08-27 VITALS — BP 103/65 | HR 70 | Ht 70.0 in | Wt 206.0 lb

## 2023-08-27 DIAGNOSIS — M4306 Spondylolysis, lumbar region: Secondary | ICD-10-CM | POA: Diagnosis not present

## 2023-08-27 DIAGNOSIS — G7000 Myasthenia gravis without (acute) exacerbation: Secondary | ICD-10-CM

## 2023-08-27 MED ORDER — PREDNISONE 1 MG PO TABS: ORAL_TABLET | ORAL | 1 refills | Status: DC

## 2023-08-27 NOTE — Progress Notes (Signed)
Follow-up Visit   Date: 08/27/23    [name] MRN: [id number] DOB: 1941-07-18   Interim History: [name] is a 82 y.o. right-handed Caucasian male with hyperlipidemia, COPD, peripheral neuropathy, SVT s/p ablation, afib on anticoagulation, BPH, former smoker, and diverticulitis returning to the clinic for follow-up of myasthenia gravis.  The patient was accompanied to the clinic by wife.   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  Initially, hospitalized in Sept 2019, responsive to IVIG and prednisone 30mg /d.  Since this time, he has been on a tapering dose prednisone and tolerated it well.  He has been on prednisone 20mg  since 2020 with no further exacerbations.  No evidence of disease manifestation.  He has been doing well on prednisone 7mg  for several months, so will continue slow taper.  - Reduce prednisone to 6mg  x 2 months, then reduce to 5mg  daily - If he has breakthrough weakness during taper, increase back to 10mg  daily - Continue mestinon 30mg  three times daily.    2.  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 Modesto Charon at [facility]  Return to clinic in 3 months  ---------------------------------------------------------------------- History of present illness: Patient was diagnosed with myasthenia gravis in August 2019 after presenting with left ptosis, facial weakness, double vision, and dysphagia. He went to the ER for these symptoms where stroke was excluded and AChR antibodies returned positive. He was started on mestinon 30mg  three times daily (8am, 4pm, and midnight).  He was started on prednisone 20mg , with no improvement.  He was hospitalized from 9/19 - 07/23/2018 with MG exacerbated and treated with IVIG with great response. His prednisone was increased to 30mg  daily and he was  started on IVIG, which helped control MG symptoms. During 2020, his prednisone was tapered and he has been on prednisone 10mg  daily.    He also complains of severe neck pain which is worse with prolonged standing and neck extension.  He usually has to sit back down and rest for relief. He does not have numbness/tingling of the hands or weakness.    He has previously been evaluated for neuropathy at [facility] and [facility] with NCS/EMG which showed peripheral sensorimotor axonal neuropathy.  CSF testing was normal and genetic testing was normal.    UPDATE 05/25/2021:   He was at the beach last week and did well in the hot temperatures. His wife states he has rare spells of difficulty swallowing, but also feels he may not be chewing his food.  He is compliant with prednisone 10mg  daily and mestinon 30mg  three times daily.  UPDATE 09/17/2022:  He is on prednisone 8mg  since the beginning of the month and denies any new weakness.  UPDATE 01/01/2023:  He is here for follow-up visit.  He remains on prednisone 8mg .  He is tolerating this well and denies weakness, droopy eyelids, or double vision.   Wife says that after eating his food, he tends to cough for some time, but there is no choking.  He has a few areas on his skin resected for precancerous lesions and is doing well.   UPDATE 04/16/2023:  He is here for follow-up visit.  He has been on prednisone 7mg  for the past several months and doing very well.  He denies weakness, vision change, or ptosis.    UPDATE 08/27/2023:  He is here for follow-up visit. He remains  on prednisone 7mg  and mestinon 30mg  three times daily and denies any issues with double vision, ptosis, difficulty with swallow/speech, or limb weakness. He continues to remain highly independent and sometimes gets tired easily.   Medications:  Current Outpatient Medications on File Prior to Visit  Medication Sig Dispense Refill   albuterol (VENTOLIN HFA) 108 (90 Base) MCG/ACT inhaler Inhale 1-2  puffs into the lungs every 6 (six) hours as needed for wheezing or shortness of breath. 18 g 5   atorvastatin (LIPITOR) 40 MG tablet TAKE 1 TABLET BY MOUTH EVERY DAY 90 tablet 3   calcium carbonate (TUMS - DOSED IN MG ELEMENTAL CALCIUM) 500 MG chewable tablet Chew 500 mg by mouth daily.     Cyanocobalamin (VITAMIN B-12 PO) Take 5,000 mcg by mouth daily.      doxazosin (CARDURA) 8 MG tablet Take 8 mg by mouth daily.     finasteride (PROSCAR) 5 MG tablet 5 mg daily.     fluticasone (FLONASE) 50 MCG/ACT nasal spray Place 2 sprays into both nostrils daily.     furosemide (LASIX) 20 MG tablet Take 20 mg by mouth daily. PRN     glucosamine-chondroitin 500-400 MG tablet Take 1 tablet by mouth daily.     ipratropium (ATROVENT) 0.03 % nasal spray PLACE 1 SPRAY INTO BOTH NOSTRILS DAILY AS NEEDED FOR RHINITIS. 90 mL 1   metoprolol succinate (TOPROL-XL) 50 MG 24 hr tablet Take 1 tablet (50 mg total) by mouth daily. 90 tablet 3   montelukast (SINGULAIR) 10 MG tablet Take 10 mg by mouth daily.     Multiple Vitamin (MULTIVITAMIN WITH MINERALS) TABS tablet Take 1 tablet by mouth daily.     Potassium Chloride ER 20 MEQ TBCR Take 1 tablet by mouth daily.     predniSONE (DELTASONE) 1 MG tablet Take 2mg  daily with 5mg  tablet = 7mg  x 2 months, then 6mg  daily. 270 tablet 1   predniSONE (DELTASONE) 5 MG tablet Take 1 tablet (5 mg total) by mouth daily with breakfast. 90 tablet 3   pyridostigmine (MESTINON) 60 MG tablet Take 0.5 tablets (30 mg total) by mouth 3 (three) times daily. Take half tablet at 8am, 1pm, and 6pm 135 tablet 3   warfarin (COUMADIN) 5 MG tablet TAKE 1 TABLET BY MOUTH DAILY OR AS DIRECTED BY COUMADIN CLINIC 90 tablet 1   Current Facility-Administered Medications on File Prior to Visit  Medication Dose Route Frequency Provider Last Rate Last Admin   triamcinolone acetonide (KENALOG) 10 MG/ML injection 10 mg  10 mg Intramuscular Once         Allergies:  Allergies  Allergen Reactions   Ambrosia  Trifida (Tall Ragweed) Allergy Skin Test Anaphylaxis   Bee Pollen Itching    Nasal congestion   Beta Adrenergic Blockers     Other reaction(s): MG   Ciprofloxacin     Other reaction(s): myasthenia gravis   Eliquis [Apixaban]     Throat tightening   Erythromycin     Other reaction(s): MG   Pollen Extract Other (See Comments)    Nasal congestion    Vital Signs:  BP 103/65   Pulse 70   Ht 5\' 10"  (1.778 m)   Wt 206 lb (93.4 kg)   SpO2 97%   BMI 29.56 kg/m   Neurological Exam: MENTAL STATUS including orientation to time, place, person, recent and remote memory, attention span and concentration, language, and fund of knowledge is normal.  Speech is not dysarthric.  CRANIAL NERVES:   Normal conjugate,  extra-ocular eye movements in all directions of gaze.  Mild ptosis (stable)  Face is symmetric.  Orbicularis oculi, buccinator, and orbicularis oris is 5/5.  Tongue strength is 5/5  MOTOR:  Motor strength is 5/5 in all extremities.  Neck flexion is 5/5.   COORDINATION/GAIT:    Gait narrow based, stooped, mildly antalgic appearing. He is able to stand up without using arms to push off. Unassisted, stable.   Data: Labs 06/15/2018:  AChR binding 1.69*, MUSK neg   MRI brain wo contrast 06/14/2018: Negative for acute infarct. 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   CTA head and neck 06/14/2018: 1. Negative for large vessel occlusion, dissection, or arterial stenosis in the head or neck. There is minimal atherosclerosis. 2. Positive for generalized arterial tortuosity. Ectatic distal aortic arch and proximal descending aorta. 3. A small 15 mm area of ground-glass opacity in the right upper lobe appears stable since 2015. 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   NCS/EMG of the legs 03/13/2018:  This is an abnormal study. There is electrophysiologic evidence of length-dependent, moderately-severe, axonal  sensory-motor polyneuropathy.   CT chest 07/11/2018:  Negative for thymoma  MRI cervical spine wo contrast 07/14/2018: 1. No acute finding. 2. Disc and facet degeneration causes foraminal impingement that is advanced on the left at c3-4 and on the left at C5-6. 3. Mild right foraminal narrowing at C5-6 and C6-7.    Thank you for allowing me to participate in patient's care.  If I can answer any additional questions, I would be pleased to do so.    Sincerely,    Talise Sligh K. Allena Katz, DO

## 2023-08-27 NOTE — Patient Instructions (Signed)
Reduce prednisone to 6mg  x 2 months, then reduce to 5mg  daily Continue mestinon 30mg  three times daily

## 2023-09-04 DIAGNOSIS — L570 Actinic keratosis: Secondary | ICD-10-CM | POA: Diagnosis not present

## 2023-09-04 DIAGNOSIS — R233 Spontaneous ecchymoses: Secondary | ICD-10-CM | POA: Diagnosis not present

## 2023-09-04 DIAGNOSIS — Z08 Encounter for follow-up examination after completed treatment for malignant neoplasm: Secondary | ICD-10-CM | POA: Diagnosis not present

## 2023-09-04 DIAGNOSIS — Z85828 Personal history of other malignant neoplasm of skin: Secondary | ICD-10-CM | POA: Diagnosis not present

## 2023-09-04 DIAGNOSIS — L821 Other seborrheic keratosis: Secondary | ICD-10-CM | POA: Diagnosis not present

## 2023-09-05 ENCOUNTER — Ambulatory Visit: Payer: Medicare Other | Attending: Cardiology | Admitting: *Deleted

## 2023-09-05 DIAGNOSIS — I4891 Unspecified atrial fibrillation: Secondary | ICD-10-CM | POA: Insufficient documentation

## 2023-09-05 DIAGNOSIS — Z7901 Long term (current) use of anticoagulants: Secondary | ICD-10-CM | POA: Diagnosis not present

## 2023-09-05 DIAGNOSIS — Z5181 Encounter for therapeutic drug level monitoring: Secondary | ICD-10-CM | POA: Diagnosis not present

## 2023-09-05 LAB — POCT INR: INR: 1.8 — AB (ref 2.0–3.0)

## 2023-09-05 NOTE — Patient Instructions (Signed)
Description   Today take 1.5 tablets of warfarin then START taking warfarin 1 tablet daily. Recheck INR in 4 weeks.  Call the Coumadin Clinic with any upcoming procedures or new medications.  Coumadin Clinic [phone]

## 2023-09-12 ENCOUNTER — Encounter: Payer: Self-pay | Admitting: Neurology

## 2023-09-16 ENCOUNTER — Encounter: Payer: Self-pay | Admitting: Pulmonary Disease

## 2023-09-16 ENCOUNTER — Telehealth: Payer: Self-pay

## 2023-09-16 ENCOUNTER — Ambulatory Visit (INDEPENDENT_AMBULATORY_CARE_PROVIDER_SITE_OTHER): Payer: Medicare Other | Admitting: Pulmonary Disease

## 2023-09-16 VITALS — BP 114/68 | HR 83 | Temp 97.7°F | Ht 72.0 in | Wt 210.6 lb

## 2023-09-16 DIAGNOSIS — J471 Bronchiectasis with (acute) exacerbation: Secondary | ICD-10-CM | POA: Diagnosis not present

## 2023-09-16 DIAGNOSIS — J449 Chronic obstructive pulmonary disease, unspecified: Secondary | ICD-10-CM | POA: Diagnosis not present

## 2023-09-16 MED ORDER — AMOXICILLIN-POT CLAVULANATE 875-125 MG PO TABS: 1.0000 | ORAL_TABLET | Freq: Two times a day (BID) | ORAL | 0 refills | Status: AC

## 2023-09-16 MED ORDER — PREDNISONE 1 MG PO TABS: ORAL_TABLET | ORAL | 1 refills | Status: DC

## 2023-09-16 NOTE — Telephone Encounter (Signed)
Patient requesting refill of prednisone. He [place] currently taking 6mg  daily.

## 2023-09-16 NOTE — Patient Instructions (Signed)
Nice to see you again  Take Augmentin 1 tablet twice a day for 10 days.  To help with the cough.  On the right lung you sounded junky a lot of mucus.  Hopefully this helps.  To help longer-term, increase the Aerobika flutter device, do it in the morning, midday, and evening.  Do 10 puffs at a time.  Do 10 puffs then wait until a minute and then do 10 puffs again.  10 puffs at a time, 5 times in the session.  3 sessions a day.  Morning, midday and evening.  No other changes to medications  Return to clinic in 3 months or sooner as needed with Dr. Judeth Horn

## 2023-09-16 NOTE — Progress Notes (Signed)
Synopsis: Referred for chronic cough by Irven Coe, MD  Subjective:   PATIENT ID: [name] GENDER: male DOB: 07/27/1941, MRN: [id number]  Chief Complaint  Patient presents with   Follow-up    Patient is having cough with mucous.   CC: cough  82 y.o. 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 Never has lived anywhere else other than [place], [place]. Pets: No pets. No birds. No feather pillows or down comforters.  Travel: none pertinent  Had ablation for some form of SVT vs AF about 6 years ago.   Interval HPI:  Relatively stable.  Cough remains a problem.  Rhonchorous on exam.  Recent trip, flu without issue.  Reports using Wixela 1 puff twice daily.  Rare use of albuterol.  Using flutter valve Aerobika just 10 puffs once a day.  Discussed escalating frequency.  Given exam, may be time for another round of Augmentin.  Prescribed today.   Past Medical History:  Diagnosis Date   Abscess of sigmoid colon due to diverticulitis 12/26/2018   Appendicitis 09/05/2014   Basal cell carcinoma 10/04/2015   left [id number]   BCC (basal cell carcinoma) 11/03/2019   left shin (txpbx)   Colon polyp    Complication of anesthesia    Difficult to arouse, Combative x 1   COVID    Diverticulitis    Diverticulosis    extensive 3'15.   Dysrhythmia    hx. 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  Many polyps.  rec f/u colonoscopy summer 2020   EUS N/A 11/17/2014   Procedure: ESOPHAGEAL ENDOSCOPIC ULTRASOUND (EUS) RADIAL;  Surgeon: Willis Modena, MD;  Location: [facility];  Service: Endoscopy;  Laterality: N/A;   FINE NEEDLE ASPIRATION N/A 11/17/2014   Procedure: FINE NEEDLE ASPIRATION (FNA) LINEAR;  Surgeon: Willis Modena, MD;  Location: [facility];  Service: Endoscopy;  Laterality: N/A;  + or- fna   FLEXIBLE SIGMOIDOSCOPY     HEMORRHOID SURGERY  banding    INGUINAL HERNIA REPAIR Bilateral 2005   '05 bilateral hernia   KNEE ARTHROSCOPY Left 2008   scope '08   LAPAROSCOPIC APPENDECTOMY N/A 09/05/2014   Procedure: APPENDECTOMY LAPAROSCOPIC;  Surgeon: Abigail Miyamoto, MD;  Location: [facility] OR;  Service: General;  Laterality: N/A;   PROCTOSCOPY N/A 12/26/2018   Procedure: RIGID PROCTOSCOPY;  Surgeon: Karie Soda, MD;  Location: [facility] ORS;  Service: General;  Laterality: N/A;   ROTATOR CUFF REPAIR Right 2016    Social History   Socioeconomic History   Marital status: Married    Spouse name: Not on file   Number of children: 3   Years of education: 14   Highest education level: Some college, no degree  Occupational History   Occupation: sales-Retired, works one day a week   Tobacco Use   Smoking status: Former    Current packs/day: 0.00    Average packs/day: 1 pack/day for 47.0 years (47.0 ttl pk-yrs)    Types: Cigarettes    Start date: 10/29/1953    Quit date: 10/29/2000    Years since quitting: 22.8   Smokeless tobacco: Never  Vaping Use   Vaping status: Never Used  Substance and Sexual Activity   Alcohol use: Yes    Alcohol/week: 1.0 standard drink of alcohol    Types: 1 Standard drinks or equivalent per week    Comment: socially- nightly 1 cocktail   Drug use: No   Sexual activity: Not on file  Other Topics Concern   Not on file  Social History Narrative   Lives with wife in a one story home with a basement.  Has 3 daughters.  Works one day a week at the [employer].  Retired from Airline pilot.  Education: some college.    Caffeine use: decaf   Right handed   One story home   Social Determinants of Health   Financial Resource Strain: Low Risk  (05/04/2020)   Received from [facility]   Overall Financial Resource Strain (CARDIA)    Difficulty of Paying Living Expenses: Not hard at all  Food Insecurity: No Food Insecurity (05/04/2020)   Received from [facility]   Hunger Vital Sign    Worried About Running Out of Food in the Last Year: Never true    Ran Out of Food in the Last Year: Never true  Transportation Needs: No Transportation Needs (05/04/2020)   Received from [facility]   [employer] - Administrator, Civil Service (Medical): No    Lack of Transportation (Non-Medical): No  Physical Activity: Not on file  Stress: Not on file  Social Connections: Moderately Isolated (05/04/2020)   [facility]   Social Connection and Isolation Panel [NHANES]    Frequency of Communication with Friends and Family: More than three times a week    Frequency of Social Gatherings with Friends  and Family: Twice a week    Attends Religious Services: Never    Database administrator or Organizations: No    Attends Engineer, structural: Never    Marital Status: Married  Catering manager Violence: Not on file     Allergies  Allergen Reactions   Ambrosia Trifida (Tall Ragweed) Allergy Skin Test Anaphylaxis   Bee Pollen Itching    Nasal congestion   Beta Adrenergic Blockers     Other reaction(s): MG   Ciprofloxacin  Other reaction(s): myasthenia gravis   Eliquis [Apixaban]     Throat tightening   Erythromycin     Other reaction(s): MG   Pollen Extract Other (See Comments)    Nasal congestion     Outpatient Medications Prior to Visit  Medication Sig Dispense Refill   albuterol (VENTOLIN HFA) 108 (90 Base) MCG/ACT inhaler Inhale 1-2 puffs into the lungs every 6 (six) hours as needed for wheezing or shortness of breath. 18 g 5   atorvastatin (LIPITOR) 40 MG tablet TAKE 1 TABLET BY MOUTH EVERY DAY 90 tablet 3   calcium carbonate (TUMS - DOSED IN MG ELEMENTAL CALCIUM) 500 MG chewable tablet Chew 500 mg by mouth daily.     Cyanocobalamin (VITAMIN B-12 PO) Take 5,000 mcg by mouth daily.      doxazosin (CARDURA) 8 MG tablet Take 8 mg by mouth daily.     finasteride (PROSCAR) 5 MG tablet 5 mg daily.     fluticasone (FLONASE) 50 MCG/ACT nasal spray Place 2 sprays into both nostrils daily.     furosemide (LASIX) 20 MG tablet Take 20 mg by mouth daily. PRN     glucosamine-chondroitin 500-400 MG tablet Take 1 tablet by mouth daily.     ipratropium (ATROVENT) 0.03 % nasal spray PLACE 1 SPRAY INTO BOTH NOSTRILS DAILY AS NEEDED FOR RHINITIS. 90 mL 1   metoprolol succinate (TOPROL-XL) 50 MG 24 hr tablet Take 1 tablet (50 mg total) by mouth daily. 90 tablet 3   montelukast (SINGULAIR) 10 MG tablet Take 10 mg by mouth daily.     Multiple Vitamin (MULTIVITAMIN WITH MINERALS) TABS tablet Take 1 tablet by mouth daily.     Potassium Chloride ER 20 MEQ TBCR Take 1 tablet by mouth  daily.     predniSONE (DELTASONE) 1 MG tablet Take 1 tablet along with 5mg  tablet daily 30 tablet 1   predniSONE (DELTASONE) 5 MG tablet Take 1 tablet (5 mg total) by mouth daily with breakfast. 90 tablet 3   pyridostigmine (MESTINON) 60 MG tablet Take 0.5 tablets (30 mg total) by mouth 3 (three) times daily. Take half tablet at 8am, 1pm, and 6pm 135 tablet 3   warfarin (COUMADIN) 5 MG tablet TAKE 1 TABLET BY MOUTH DAILY OR AS DIRECTED BY COUMADIN CLINIC 90 tablet 1   Facility-Administered Medications Prior to Visit  Medication Dose Route Frequency Provider Last Rate Last Admin   triamcinolone acetonide (KENALOG) 10 MG/ML injection 10 mg  10 mg Intramuscular Once            Objective:   Physical Exam:  General appearance: 82 y.o., male, NAD, conversant  Eyes: anicteric sclerae, moist conjunctivae; no lid-lag; PERRL, tracking appropriately HENT: NCAT; oropharynx, MMM, no mucosal ulcerations; normal hard and soft palate Neck: Trachea midline; no lymphadenopathy, no JVD Lungs: very rhonchorous on the right lower lung fields more so than upper, clear on left, with normal respiratory effort CV: RRR, no MRGs  Abdomen: Soft, non-tender; non-distended, BS present  Extremities: No peripheral edema, radial and DP pulses present bilaterally  Skin: Normal temperature, turgor and texture; no rash Psych: Appropriate affect Neuro: Alert and oriented to person and place, no focal deficit    Vitals:   09/16/23 1053  BP: 114/68  Pulse: 83  Temp: 97.7 F (36.5 C)  TempSrc: Oral  SpO2: 93%  Weight: 210 lb 9.6 oz (95.5 kg)  Height: 6' (1.829 m)      93% on RA BMI Readings from Last 3 Encounters:  09/16/23  28.56 kg/m  08/27/23 29.56 kg/m  06/27/23 29.59 kg/m   Wt Readings from Last 3 Encounters:  09/16/23 210 lb 9.6 oz (95.5 kg)  08/27/23 206 lb (93.4 kg)  06/27/23 206 lb 3.2 oz (93.5 kg)     CBC    Component Value Date/Time   WBC 6.8 11/05/2021 0501   RBC 4.03 (L)  11/05/2021 0501   HGB 12.0 (L) 11/05/2021 0501   HGB 15.2 03/08/2016 1133   HGB 15.0 11/02/2014 1104   HCT 37.5 (L) 11/05/2021 0501   HCT 46.7 06/14/2018 1605   HCT 45.6 11/02/2014 1104   PLT 189 11/05/2021 0501   PLT 249 03/08/2016 1133   MCV 93.1 11/05/2021 0501   MCV 88 03/08/2016 1133   MCV 89.1 11/02/2014 1104   MCH 29.8 11/05/2021 0501   MCHC 32.0 11/05/2021 0501   RDW 14.3 11/05/2021 0501   RDW 14.0 03/08/2016 1133   RDW 13.9 11/02/2014 1104   LYMPHSABS 0.8 11/03/2021 1023   LYMPHSABS 1.2 11/02/2014 1104   MONOABS 0.9 11/03/2021 1023   MONOABS 0.5 11/02/2014 1104   EOSABS 0.1 11/03/2021 1023   EOSABS 0.2 11/02/2014 1104   BASOSABS 0.0 11/03/2021 1023   BASOSABS 0.0 11/02/2014 1104    Low level peripheral eosinophilia historically  Timothy grass, ragweed aeroallergen +  Chest Imaging: CTA Chest 04/2020 OSH report reviewed by me: no PE, +bilateral atelectasis, multiple hepatic hypodensities, small PCE   CT Chest 02/10/21: personally reviewed by me and remarkable for stable RUL gg nodule, lower lobe bronchiolitis R>L, secretions in RMB, RLL. Emphysema.  CXR 03/09/23 unchanged  Pulmonary Functions Testing Results:    Latest Ref Rng & Units 07/18/2021   11:54 AM  PFT Results  FVC-Pre L 3.79   FVC-Predicted Pre % 89   FVC-Post L 4.04   FVC-Predicted Post % 95   Pre FEV1/FVC % % 62   Post FEV1/FCV % % 62   FEV1-Pre L 2.34   FEV1-Predicted Pre % 77   FEV1-Post L 2.50   DLCO uncorrected ml/min/mmHg 19.80   DLCO UNC% % 77   DLCO corrected ml/min/mmHg 19.80   DLCO COR %Predicted % 77   DLVA Predicted % 89   TLC L 7.18   TLC % Predicted % 97   RV % Predicted % 123    07/18/21 PFT Reviewed by me ratio 62 mild obstruction, lung volumes with air trapping, very mildly reduced diffusing capacitiy  Reviewed [facility] PFTs: 01/2020 FEV1/FVC 54, post BD FEV1 82.8% without BD response.     Echocardiogram:   05/03/20 WVP:XTGGY PCE, age appropriate diastolic parameters      Assessment & Plan:   Chronic cough: has had trials of ppi/H2B. Suspect multifactorial with COPD, UACS with persistent postnasal drip, possible component of LPR (despite negative trial of PPI) but would have expected more edematous appearing vocal cords/folds on laryngoscopy, mild bronchiectasis. RLL changes that appear consistent with recurrent aspiration and MEP low which together with his MG raises question of weak/ineffective cough. Has responded to antibiotics in pst. Overall, better than baseline.  Continue Wixela.. Per Dr. Allena Katz (neurology) would prefer to avoid LAMA if possible with MG.  Bronchiectasis with exacerbation: suspect aspiration as cause.  Rhonchorous throughout on the right but worse on the lower lobe on the right, clear on left. 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   MG: MEP was just below LLN. TLC, FVC WNL 02/04/20. Periodic surveillance spiro.  RUL GG pulmonary nodule: stable since 2015. No further need for surveillance.   RTC 3 months with Dr. Orland Mustard, MD [facility] 09/16/2023 11:18 AM

## 2023-09-17 ENCOUNTER — Encounter: Payer: Self-pay | Admitting: Podiatry

## 2023-09-17 ENCOUNTER — Ambulatory Visit (INDEPENDENT_AMBULATORY_CARE_PROVIDER_SITE_OTHER): Payer: Medicare Other | Admitting: Podiatry

## 2023-09-17 DIAGNOSIS — M7742 Metatarsalgia, left foot: Secondary | ICD-10-CM

## 2023-09-17 DIAGNOSIS — M7741 Metatarsalgia, right foot: Secondary | ICD-10-CM

## 2023-09-17 NOTE — Progress Notes (Signed)
Chief Complaint  Patient presents with   Foot Pain    Follow up capsulitis plantar forefoot bilateral (R>L)   "I have been still having that pain in the ball of my foot"    HPI: 82 y.o. male presents today with pain to the bilateral forefoot region.  Patient received injections to the left peroneal tendon, right PT tendon on 10/17 during previous visit, patient reported that this improved his pain significantly and he is doing well with this.  He has had difficulty using the compression anklets.  He does have history of supra ventricular tachycardia, dysrhythmia with history of ablation, significant GI history.  He denies any nausea, vomiting, fever, chills, chest pain, shortness of breath.  Past Medical History:  Diagnosis Date   Abscess of sigmoid colon due to diverticulitis 12/26/2018   Appendicitis 09/05/2014   Basal cell carcinoma 10/04/2015   left deltoid-[id number]   BCC (basal cell carcinoma) 11/03/2019   left shin (txpbx)   Colon polyp    Complication of anesthesia    Difficult to arouse, Combative x 1   COVID    Diverticulitis    Diverticulosis    extensive 3'15.   Dysrhythmia    hx. 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HCC)    Neuromuscular disorder (HCC)    "neuroma"    Osteoarthritis    Palpitation    with svt rate 150   Squamous cell carcinoma of skin 10/04/2015   left cheek (cx72fu)   SUPRAVENTRICULAR TACHYCARDIA 01/11/2011   Qualifier: Diagnosis of  By: Ladona Ridgel, MD, Jerrell Mylar    Tendinitis    achiles heel spur   Testicular atrophy    Tubular adenoma of colon     Past Surgical History:  Procedure Laterality Date   CARDIAC ELECTROPHYSIOLOGY MAPPING AND ABLATION  2010   cardiac    COLONOSCOPY  11/2018   Dr Bosie Clos.  Many polyps.  rec f/u colonoscopy  summer 2020   EUS N/A 11/17/2014   Procedure: ESOPHAGEAL ENDOSCOPIC ULTRASOUND (EUS) RADIAL;  Surgeon: Willis Modena, MD;  Location: [facility];  Service: Endoscopy;  Laterality: N/A;   FINE NEEDLE ASPIRATION N/A 11/17/2014   Procedure: FINE NEEDLE ASPIRATION (FNA) LINEAR;  Surgeon: Willis Modena, MD;  Location: [facility];  Service: Endoscopy;  Laterality: N/A;  + or- fna   FLEXIBLE SIGMOIDOSCOPY     HEMORRHOID SURGERY     banding    INGUINAL HERNIA REPAIR Bilateral 2005   '05 bilateral hernia   KNEE ARTHROSCOPY Left 2008   scope '08   LAPAROSCOPIC APPENDECTOMY N/A 09/05/2014   Procedure: APPENDECTOMY LAPAROSCOPIC;  Surgeon: Abigail Miyamoto, MD;  Location: [facility] OR;  Service: General;  Laterality: N/A;   PROCTOSCOPY N/A 12/26/2018   Procedure: RIGID PROCTOSCOPY;  Surgeon: Karie Soda, MD;  Location: [facility];  Service: General;  Laterality: N/A;   ROTATOR CUFF REPAIR Right 2016    Allergies  Allergen Reactions   Ambrosia Trifida (Tall Ragweed) Allergy Skin Test Anaphylaxis   Bee Pollen Itching    Nasal congestion   Beta Adrenergic Blockers     Other reaction(s): MG   Ciprofloxacin     Other reaction(s): myasthenia gravis   Eliquis [Apixaban]     Throat tightening  Erythromycin     Other reaction(s): MG   Pollen Extract Other (See Comments)    Nasal congestion    ROS negative except as stated in HPI   Physical Exam: There were no vitals filed for this visit.  General: The patient is alert and oriented x3 in no acute distress.  Dermatology: Skin is warm, dry and supple bilateral lower extremities.  Pedal skin somewhat atrophic.  Fat pad atrophy noted to the metatarsal heads which are prominent.  No bruising, wounds or open lesions appreciated.  Vascular: Palpable pedal pulses bilaterally. Capillary refill within normal limits.  Lower extremities mildly edematous, there is localized edema to the right anterior medial ankle.  No erythema or calor.  Neurological: Light  touch sensation grossly intact bilateral feet.   Musculoskeletal Exam: Muscle strength 5/5 for all major muscle groups.  Prominent metatarsal heads noted bilaterally due to fat pad atrophy.  These are tender on palpation.   Assessment/Plan of Care: 1. Metatarsalgia of both feet      No orders of the defined types were placed in this encounter.  None  Discussed clinical findings with patient today.  # Metatarsalgia -Metatarsal pads dispensed to patient in effort to offload the tender metatarsal heads -If this is effective he may benefit from accommodative orthotic device -Deferring oral steroid and oral NSAID use due to comorbidities -Did discuss conservative versus surgical options for the metatarsalgia, continue with conservative care at this time. -Will have patient resume follow-up with Dr. Lonn Georgia L. Marchia Bond, AACFAS [facility]     [address] [place], [place] [address]                Office [phone]  Fax [phone]

## 2023-09-21 ENCOUNTER — Other Ambulatory Visit: Payer: Self-pay | Admitting: Cardiology

## 2023-09-21 DIAGNOSIS — I4891 Unspecified atrial fibrillation: Secondary | ICD-10-CM

## 2023-10-03 ENCOUNTER — Ambulatory Visit: Payer: Medicare Other | Attending: Cardiology | Admitting: *Deleted

## 2023-10-03 DIAGNOSIS — Z5181 Encounter for therapeutic drug level monitoring: Secondary | ICD-10-CM | POA: Diagnosis not present

## 2023-10-03 DIAGNOSIS — I4891 Unspecified atrial fibrillation: Secondary | ICD-10-CM | POA: Diagnosis not present

## 2023-10-03 LAB — POCT INR: POC INR: 2

## 2023-10-03 NOTE — Patient Instructions (Signed)
Description   Continue taking warfarin 1 tablet daily. Recheck INR in 5 weeks.  Call the Coumadin Clinic with any upcoming procedures or new medications.  Coumadin Clinic [phone]

## 2023-10-31 ENCOUNTER — Encounter: Payer: Self-pay | Admitting: Podiatry

## 2023-10-31 ENCOUNTER — Ambulatory Visit (INDEPENDENT_AMBULATORY_CARE_PROVIDER_SITE_OTHER): Payer: Medicare Other | Admitting: Podiatry

## 2023-10-31 DIAGNOSIS — M19071 Primary osteoarthritis, right ankle and foot: Secondary | ICD-10-CM | POA: Diagnosis not present

## 2023-10-31 DIAGNOSIS — M19072 Primary osteoarthritis, left ankle and foot: Secondary | ICD-10-CM

## 2023-10-31 MED ORDER — TRIAMCINOLONE ACETONIDE 40 MG/ML IJ SUSP
40.0000 mg | Freq: Once | INTRAMUSCULAR | Status: AC
  Administered 2023-10-31: 40 mg

## 2023-10-31 NOTE — Progress Notes (Signed)
 He presents today for follow-up of his metatarsalgia forefoot right.  He states that the metatarsal pads that Dr. Conrad gave him last time really did help a lot.  He states that he still hurts right here those he points to the second metatarsophalangeal joint of the right foot states the left foot is doing pretty well states that he has some tenderness beneath the arch on the right foot as he points to the talonavicular joint plantarly.  Objective: Vital signs are stable he is alert and oriented x 3.  On physical exam I can find no breach in the in the midfoot but does demonstrate osteoarthritic change of the right midfoot.  He has pain on end range of motion of the second metatarsal phalangeal joints bilateral with hammertoe deformities.  Edema to the left lower extremity more so than that of the right.  Assessment: Capsulitis second metatarsal phalangeal joint bilaterally as well as osteoarthritis midfoot right.  Edema left foot.  Plan: Discussed compression hose which she has at home.  Discussed and insole which we provided him with today power step with a metatarsal pad attached.  He saw Sueanne for this.  I also injected him around the second metatarsal phalangeal joint bilaterally with 5 mg Kenalog  5 mg Marcaine  point maximal tenderness.  I will follow-up with him in a couple of months if necessary.

## 2023-11-07 ENCOUNTER — Ambulatory Visit: Payer: Medicare Other | Attending: Cardiology | Admitting: *Deleted

## 2023-11-07 DIAGNOSIS — Z7901 Long term (current) use of anticoagulants: Secondary | ICD-10-CM | POA: Diagnosis not present

## 2023-11-07 DIAGNOSIS — I4891 Unspecified atrial fibrillation: Secondary | ICD-10-CM | POA: Diagnosis not present

## 2023-11-07 DIAGNOSIS — Z5181 Encounter for therapeutic drug level monitoring: Secondary | ICD-10-CM | POA: Insufficient documentation

## 2023-11-07 LAB — POCT INR: INR: 1.8 — AB (ref 2.0–3.0)

## 2023-11-07 NOTE — Patient Instructions (Signed)
 Description   Start taking warfarin 1 tablet daily except 1.5 tablets tablets on Thursdays. Recheck INR in 4 weeks.  Call the Coumadin Clinic with any upcoming procedures or new medications.  Coumadin Clinic [phone]

## 2023-11-08 ENCOUNTER — Other Ambulatory Visit: Payer: Self-pay | Admitting: Neurology

## 2023-11-27 ENCOUNTER — Encounter: Payer: Self-pay | Admitting: Neurology

## 2023-11-27 ENCOUNTER — Ambulatory Visit (INDEPENDENT_AMBULATORY_CARE_PROVIDER_SITE_OTHER): Payer: Medicare Other | Admitting: Neurology

## 2023-11-27 VITALS — BP 132/70 | HR 62 | Ht 72.0 in | Wt 210.0 lb

## 2023-11-27 DIAGNOSIS — G7000 Myasthenia gravis without (acute) exacerbation: Secondary | ICD-10-CM

## 2023-11-27 NOTE — Progress Notes (Signed)
Follow-up Visit   Date: 11/27/23    [name] MRN: [id number] DOB: 03-29-1941   Interim History: [name] is a 83 y.o. right-handed Caucasian male with hyperlipidemia, COPD, peripheral neuropathy, SVT s/p ablation, afib on anticoagulation, BPH, former smoker, and diverticulitis returning to the clinic for follow-up of myasthenia gravis.  The patient was accompanied to the clinic by wife.   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  Initially, hospitalized in Sept 2019, responsive to IVIG and prednisone 30mg /d.  Since this time, he has been on a tapering dose prednisone and tolerated it well.  He has been on prednisone 20mg  since 2020 with no further exacerbations.  No evidence of disease manifestation.  He has been doing well on prednisone 7mg  for several months, so will continue slow taper.  - Continue prednisone 5mg  daily.  Plan to taper after the summer - Continue mestinon 30mg  three times daily.    2.  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 Modesto Charon at [facility]  Return to clinic in 4 months  ---------------------------------------------------------------------- History of present illness: Patient was diagnosed with myasthenia gravis in August 2019 after presenting with left ptosis, facial weakness, double vision, and dysphagia. He went to the ER for these symptoms where stroke was excluded and AChR antibodies returned positive. He was started on mestinon 30mg  three times daily (8am, 4pm, and midnight).  He was started on prednisone 20mg , with no improvement.  He was hospitalized from 9/19 - 07/23/2018 with MG exacerbated and treated with IVIG with great response. His prednisone was increased to 30mg  daily and he was started on IVIG, which helped control MG symptoms. During 2020, his  prednisone was tapered and he has been on prednisone 10mg  daily.    He also complains of severe neck pain which is worse with prolonged standing and neck extension.  He usually has to sit back down and rest for relief. He does not have numbness/tingling of the hands or weakness.    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  CSF testing was normal and genetic testing was normal.    UPDATE 05/25/2021:   He was at the beach last week and did well in the hot temperatures. His wife states he has rare spells of difficulty swallowing, but also feels he may not be chewing his food.  He is compliant with prednisone 10mg  daily and mestinon 30mg  three times daily.  UPDATE 09/17/2022:  He is on prednisone 8mg  since the beginning of the month and denies any new weakness.  UPDATE 01/01/2023:  He is here for follow-up visit.  He remains on prednisone 8mg .  He is tolerating this well and denies weakness, droopy eyelids, or double vision.   Wife says that after eating his food, he tends to cough for some time, but there is no choking.  He has a few areas on his skin resected for precancerous lesions and is doing well.   UPDATE 04/16/2023:  He is here for follow-up visit.  He has been on prednisone 7mg  for the past several months and doing very well.  He denies weakness, vision change, or ptosis.    UPDATE 08/27/2023:  He is here for follow-up visit. He remains on prednisone 7mg  and mestinon 30mg  three times daily and denies any issues with  double vision, ptosis, difficulty with swallow/speech, or limb weakness. He continues to remain highly independent and sometimes gets tired easily.   UPDATE 11/27/2023:  He is here for follow-up visit.  He has been on prednisone 6mg  and denies any new complaints.  No double vision, ptosis, speech/swallow difficulty or limb weakness.  He also takes mestinon 30mg  three times daily.  He  occasionally has problems with swallowing food.   Medications:  Current Outpatient Medications on File Prior to Visit  Medication Sig Dispense Refill   albuterol (VENTOLIN HFA) 108 (90 Base) MCG/ACT inhaler Inhale 1-2 puffs into the lungs every 6 (six) hours as needed for wheezing or shortness of breath. 18 g 5   atorvastatin (LIPITOR) 40 MG tablet TAKE 1 TABLET BY MOUTH EVERY DAY 90 tablet 3   calcium carbonate (TUMS - DOSED IN MG ELEMENTAL CALCIUM) 500 MG chewable tablet Chew 500 mg by mouth daily.     Cyanocobalamin (VITAMIN B-12 PO) Take 5,000 mcg by mouth daily.      doxazosin (CARDURA) 8 MG tablet Take 8 mg by mouth daily.     finasteride (PROSCAR) 5 MG tablet 5 mg daily.     fluticasone (FLONASE) 50 MCG/ACT nasal spray Place 2 sprays into both nostrils daily.     furosemide (LASIX) 20 MG tablet Take 20 mg by mouth daily. PRN     glucosamine-chondroitin 500-400 MG tablet Take 1 tablet by mouth daily.     ipratropium (ATROVENT) 0.03 % nasal spray PLACE 1 SPRAY INTO BOTH NOSTRILS DAILY AS NEEDED FOR RHINITIS. 90 mL 1   metoprolol succinate (TOPROL-XL) 50 MG 24 hr tablet Take 1 tablet (50 mg total) by mouth daily. 90 tablet 3   montelukast (SINGULAIR) 10 MG tablet Take 10 mg by mouth daily.     Multiple Vitamin (MULTIVITAMIN WITH MINERALS) TABS tablet Take 1 tablet by mouth daily.     Potassium Chloride ER 20 MEQ TBCR Take 1 tablet by mouth daily.     predniSONE (DELTASONE) 5 MG tablet Take 1 tablet (5 mg total) by mouth daily with breakfast. 90 tablet 3   pyridostigmine (MESTINON) 60 MG tablet Take 0.5 tablets (30 mg total) by mouth 3 (three) times daily. Take half tablet at 8am, 1pm, and 6pm 135 tablet 3   warfarin (COUMADIN) 5 MG tablet TAKE 1 TABLET BY MOUTH DAILY OR AS DIRECTED BY COUMADIN CLINIC 90 tablet 1   WIXELA INHUB 250-50 MCG/ACT AEPB Inhale 1 puff into the lungs 2 (two) times daily.     Current Facility-Administered Medications on File Prior to Visit  Medication Dose Route  Frequency Provider Last Rate Last Admin   triamcinolone acetonide (KENALOG) 10 MG/ML injection 10 mg  10 mg Intramuscular Once         Allergies:  Allergies  Allergen Reactions   Ambrosia Trifida (Tall Ragweed) Allergy Skin Test Anaphylaxis   Bee Pollen Itching    Nasal congestion   Beta Adrenergic Blockers     Other reaction(s): MG   Ciprofloxacin     Other reaction(s): myasthenia gravis   Eliquis [Apixaban]     Throat tightening   Erythromycin     Other reaction(s): MG   Pollen Extract Other (See Comments)    Nasal congestion    Vital Signs:  BP 132/70   Pulse 62   Ht 6' (1.829 m)   Wt 210 lb (95.3 kg)   SpO2 92%   BMI 28.48 kg/m   Neurological Exam: MENTAL STATUS including orientation to  time, place, person, recent and remote memory, attention span and concentration, language, and fund of knowledge is normal.  Speech is not dysarthric.  CRANIAL NERVES:   Normal conjugate, extra-ocular eye movements in all directions of gaze.  Mild ptosis (stable)  Face is symmetric.  Orbicularis oculi, buccinator, and orbicularis oris is 5/5.  Tongue strength is 5/5  MOTOR:  Motor strength is 5/5 in all extremities.  No fatigability.   COORDINATION/GAIT:    Gait narrow based, stooped, stable, unassisted.   Data: Labs 06/15/2018:  AChR binding 1.69*, MUSK neg   MRI brain wo contrast 06/14/2018: Negative for acute infarct. 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   CTA head and neck 06/14/2018: 1. Negative for large vessel occlusion, dissection, or arterial stenosis in the head or neck. There is minimal atherosclerosis. 2. Positive for generalized arterial tortuosity. Ectatic distal aortic arch and proximal descending aorta. 3. A small 15 mm area of ground-glass opacity in the right upper lobe appears stable since 2015. 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   NCS/EMG of the legs 03/13/2018:  This  is an abnormal study. 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   CT chest 07/11/2018:  Negative for thymoma  MRI cervical spine wo contrast 07/14/2018: 1. No acute finding. 2. Disc and facet degeneration causes foraminal impingement that is advanced on the left at c3-4 and on the left at C5-6. 3. Mild right foraminal narrowing at C5-6 and C6-7.  Total time spent reviewing records, interview, history/exam, documentation, and coordination of care on day of encounter:  20 min     Thank you for allowing me to participate in patient's care.  If I can answer any additional questions, I would be pleased to do so.    Sincerely,    Arielis Leonhart K. Allena Katz, DO

## 2023-11-27 NOTE — Patient Instructions (Signed)
Continue prednisone 5mg  daily  Continue mestinon 30mg  three times daily

## 2023-12-05 DIAGNOSIS — H43813 Vitreous degeneration, bilateral: Secondary | ICD-10-CM | POA: Diagnosis not present

## 2023-12-05 DIAGNOSIS — H0102A Squamous blepharitis right eye, upper and lower eyelids: Secondary | ICD-10-CM | POA: Diagnosis not present

## 2023-12-05 DIAGNOSIS — H35413 Lattice degeneration of retina, bilateral: Secondary | ICD-10-CM | POA: Diagnosis not present

## 2023-12-05 DIAGNOSIS — H04123 Dry eye syndrome of bilateral lacrimal glands: Secondary | ICD-10-CM | POA: Diagnosis not present

## 2023-12-05 DIAGNOSIS — H0102B Squamous blepharitis left eye, upper and lower eyelids: Secondary | ICD-10-CM | POA: Diagnosis not present

## 2023-12-06 ENCOUNTER — Ambulatory Visit: Payer: Medicare Other | Attending: Cardiology

## 2023-12-06 DIAGNOSIS — Z5181 Encounter for therapeutic drug level monitoring: Secondary | ICD-10-CM | POA: Insufficient documentation

## 2023-12-06 DIAGNOSIS — I4891 Unspecified atrial fibrillation: Secondary | ICD-10-CM | POA: Insufficient documentation

## 2023-12-06 LAB — POCT INR: INR: 2.6 (ref 2.0–3.0)

## 2023-12-06 NOTE — Patient Instructions (Signed)
 Description   Continue taking warfarin 1 tablet daily except 1.5 tablets tablets on Thursdays.  Recheck INR in 5 weeks.  Call the Coumadin  Clinic with any upcoming procedures or new medications.  Coumadin  Clinic [phone]

## 2023-12-12 ENCOUNTER — Ambulatory Visit: Payer: Medicare Other | Admitting: Podiatry

## 2023-12-17 ENCOUNTER — Encounter: Payer: Self-pay | Admitting: Podiatry

## 2023-12-17 ENCOUNTER — Ambulatory Visit (INDEPENDENT_AMBULATORY_CARE_PROVIDER_SITE_OTHER): Payer: Medicare Other | Admitting: Podiatry

## 2023-12-17 DIAGNOSIS — M19072 Primary osteoarthritis, left ankle and foot: Secondary | ICD-10-CM

## 2023-12-17 DIAGNOSIS — M19071 Primary osteoarthritis, right ankle and foot: Secondary | ICD-10-CM | POA: Diagnosis not present

## 2023-12-17 MED ORDER — TRIAMCINOLONE ACETONIDE 40 MG/ML IJ SUSP
40.0000 mg | Freq: Once | INTRAMUSCULAR | Status: AC
  Administered 2023-12-17: 40 mg

## 2023-12-17 NOTE — Progress Notes (Signed)
He presents today states that he did not purchase the insoles that we had recommended last visit with the metatarsal pads.  He states that is too expensive and that his insurance does not cover it.  So he states today that his primary concern is not the plantar aspect of the forefoot but the dorsal aspect of the forefoot.  He states that hurts right and here as he points to the TMT joints bilaterally.  Objective: Vital signs stable oriented x 3 pulses are palpable palpation isolates a deep peroneal nerve which is exquisitely tender on palpation with underlying TMT joint arthritis.  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  Plan: Injected both of these today with 4 mg of dexamethasone and local anesthetic at the maximum point of tenderness.  I will follow-up with him in 2 months

## 2023-12-23 ENCOUNTER — Encounter: Payer: Self-pay | Admitting: Pulmonary Disease

## 2023-12-23 ENCOUNTER — Ambulatory Visit (INDEPENDENT_AMBULATORY_CARE_PROVIDER_SITE_OTHER): Payer: Medicare Other | Admitting: Pulmonary Disease

## 2023-12-23 VITALS — BP 122/66 | HR 68 | Ht 72.0 in | Wt 212.0 lb

## 2023-12-23 DIAGNOSIS — J479 Bronchiectasis, uncomplicated: Secondary | ICD-10-CM | POA: Insufficient documentation

## 2023-12-23 DIAGNOSIS — J449 Chronic obstructive pulmonary disease, unspecified: Secondary | ICD-10-CM

## 2023-12-23 MED ORDER — AMOXICILLIN-POT CLAVULANATE 875-125 MG PO TABS: 1.0000 | ORAL_TABLET | Freq: Two times a day (BID) | ORAL | 0 refills | Status: AC

## 2023-12-23 NOTE — Patient Instructions (Signed)
 Nice to see you again  Continue the Wixela inhaler 1 puff twice a day, rinse your mouth with water after every use  Continue using the flutter valve as you are, I think this has had so much and keeping the mucus moving and not letting accumulate and build up.  I sent a prescription for Augmentin 1 tablet twice a day for 10 days.  This is the antibiotic.  Please take this if for 2 to 3 days in a row the cough is worse, more frequent, more sputum, change in sputum color.  Will need to use antibiotics every so often to help keep this at bay.  Please send me a message when you start the antibiotic.  No need to start it right now.  Return to clinic in 3 months or sooner as needed with Dr. Judeth Horn

## 2023-12-23 NOTE — Progress Notes (Signed)
 Synopsis: Referred for chronic cough by Irven Coe, MD  Subjective:   PATIENT ID: [name] GENDER: male DOB: Aug 08, 1941, MRN: [id number]  Chief Complaint  Patient presents with   Follow-up    Pt is having occasional coughing w/ phlegm (white/ beige), cold weather worsens coughing. Sob w/ wheezing borderline   CC: cough  83 y.o. 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 Never has lived anywhere else other than [place], . [place]. Pets: No pets. No birds. No feather pillows or down comforters.  Travel: none pertinent  Had ablation for some form of SVT vs AF about 6 years ago.   Interval HPI:  Relatively stable.  Cough seems improved.  Not waking up at night.  Increased airway clearance via flutter valve.  This seems to be the best thing.  Currently taking Augmentin last time.  This was prescribed given concern for exacerbation, rhonchorous lung exam.  We discussed having antibiotics on hand to use in the future if needed.  He expressed understanding.   Past Medical History:  Diagnosis Date   Abscess of sigmoid colon due to diverticulitis 12/26/2018   Appendicitis 09/05/2014   Basal cell carcinoma 10/04/2015   left deltoid-[id number]   BCC (basal cell carcinoma) 11/03/2019   left shin (txpbx)   Colon polyp    Complication of anesthesia    Difficult to arouse, Combative x 1   COVID    Diverticulitis    Diverticulosis    extensive 3'15.   Dysrhythmia    hx. 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  Many polyps.  rec f/u colonoscopy summer 2020   EUS N/A 11/17/2014   Procedure: ESOPHAGEAL ENDOSCOPIC ULTRASOUND (EUS) RADIAL;  Surgeon: Willis Modena, MD;  Location: [facility];  Service: Endoscopy;  Laterality: N/A;   FINE NEEDLE ASPIRATION N/A 11/17/2014   Procedure: FINE NEEDLE ASPIRATION (FNA) LINEAR;  Surgeon: Willis Modena, MD;  Location: [facility];  Service: Endoscopy;  Laterality: N/A;  + or- fna  FLEXIBLE SIGMOIDOSCOPY     HEMORRHOID SURGERY     banding    INGUINAL HERNIA REPAIR Bilateral 2005   '05 bilateral hernia   KNEE ARTHROSCOPY Left 2008   scope '08   LAPAROSCOPIC APPENDECTOMY N/A 09/05/2014   Procedure: APPENDECTOMY LAPAROSCOPIC;  Surgeon: Abigail Miyamoto, MD;  Location: [facility];  Service: General;  Laterality: N/A;   PROCTOSCOPY N/A 12/26/2018   Procedure: RIGID PROCTOSCOPY;  Surgeon: Karie Soda, MD;  Location: [facility];  Service: General;  Laterality: N/A;   ROTATOR CUFF REPAIR Right 2016    Social History   Socioeconomic History   Marital status: Married    Spouse name: Not on file   Number of children: 3   Years of education: 14   Highest  education level: Some college, no degree  Occupational History   Occupation: sales-Retired, works one day a week  Tobacco Use   Smoking status: Former    Current packs/day: 0.00    Average packs/day: 1 pack/day for 47.0 years (47.0 ttl pk-yrs)    Types: Cigarettes    Start date: 10/29/1953    Quit date: 10/29/2000    Years since quitting: 23.1   Smokeless tobacco: Never  Vaping Use   Vaping status: Never Used  Substance and Sexual Activity   Alcohol use: Not Currently    Alcohol/week: 1.0 standard drink of alcohol    Types: 1 Standard drinks or equivalent per week    Comment: socially- nightly 1 cocktail   Drug use: No   Sexual activity: Not on file  Other Topics Concern   Not on file  Social History Narrative   Lives with wife in a one story home with a basement.  Has 3 daughters.  Works one day a week at the [employer].  Retired from Airline pilot.  Education: some college.    Caffeine use: decaf   Right handed   One story home   Social Drivers of Health   Financial Resource Strain: Low Risk  (05/04/2020)   Received from [facility]   Overall Financial Resource Strain (CARDIA)    Difficulty of Paying Living Expenses: Not hard at all  Food Insecurity: No Food Insecurity (05/04/2020)   Received from [facility]   Hunger Vital Sign    Worried About Running Out of Food in the Last Year: Never true    Ran Out of Food in the Last Year: Never true  Transportation Needs: No Transportation Needs (05/04/2020)   Received from [facility]   [employer] - Administrator, Civil Service (Medical): No    Lack of Transportation (Non-Medical): No  Physical Activity: Not on file  Stress: Not on file  Social Connections: Moderately Isolated (05/04/2020)   [facility]   Social Connection and Isolation Panel [NHANES]    Frequency  of Communication with Friends and Family: More than three times a week    Frequency of Social Gatherings with Friends and Family: Twice a week    Attends Religious Services: Never    Database administrator or Organizations: No    Attends Banker Meetings: Never    Marital Status: Married  Catering manager Violence: Not on file     Allergies  Allergen Reactions   Ambrosia Trifida (Tall Ragweed) Allergy Skin Test Anaphylaxis   Bee Pollen Itching    Nasal congestion   Beta Adrenergic Blockers  Other reaction(s): MG   Ciprofloxacin     Other reaction(s): myasthenia gravis   Eliquis [Apixaban]     Throat tightening   Erythromycin     Other reaction(s): MG   Pollen Extract Other (See Comments)    Nasal congestion     Outpatient Medications Prior to Visit  Medication Sig Dispense Refill   atorvastatin (LIPITOR) 40 MG tablet TAKE 1 TABLET BY MOUTH EVERY DAY 90 tablet 3   Cyanocobalamin (VITAMIN B-12 PO) Take 5,000 mcg by mouth daily.      doxazosin (CARDURA) 8 MG tablet Take 8 mg by mouth daily.     finasteride (PROSCAR) 5 MG tablet 5 mg daily.     fluticasone (FLONASE) 50 MCG/ACT nasal spray Place 2 sprays into both nostrils daily.     furosemide (LASIX) 20 MG tablet Take 20 mg by mouth daily. PRN     glucosamine-chondroitin 500-400 MG tablet Take 1 tablet by mouth daily.     ipratropium (ATROVENT) 0.03 % nasal spray PLACE 1 SPRAY INTO BOTH NOSTRILS DAILY AS NEEDED FOR RHINITIS. 90 mL 1   metoprolol succinate (TOPROL-XL) 50 MG 24 hr tablet Take 1 tablet (50 mg total) by mouth daily. 90 tablet 3   montelukast (SINGULAIR) 10 MG tablet Take 10 mg by mouth daily.     Multiple Vitamin (MULTIVITAMIN WITH MINERALS) TABS tablet Take 1 tablet by mouth daily.     Potassium Chloride ER 20 MEQ TBCR Take 1 tablet by mouth daily.     predniSONE (DELTASONE) 5 MG tablet Take 1 tablet (5 mg total) by mouth daily with breakfast. 90 tablet 3   pyridostigmine (MESTINON) 60 MG tablet  Take 0.5 tablets (30 mg total) by mouth 3 (three) times daily. Take half tablet at 8am, 1pm, and 6pm 135 tablet 3   warfarin (COUMADIN) 5 MG tablet TAKE 1 TABLET BY MOUTH DAILY OR AS DIRECTED BY COUMADIN CLINIC 90 tablet 1   WIXELA INHUB 250-50 MCG/ACT AEPB Inhale 1 puff into the lungs 2 (two) times daily.     albuterol (VENTOLIN HFA) 108 (90 Base) MCG/ACT inhaler Inhale 1-2 puffs into the lungs every 6 (six) hours as needed for wheezing or shortness of breath. (Patient not taking: Reported on 12/23/2023) 18 g 5   Facility-Administered Medications Prior to Visit  Medication Dose Route Frequency Provider Last Rate Last Admin   triamcinolone acetonide (KENALOG) 10 MG/ML injection 10 mg  10 mg Intramuscular Once            Objective:   Physical Exam:  General appearance: 83 y.o., male, NAD, conversant  Eyes: anicteric sclerae, moist conjunctivae; no lid-lag; PERRL, tracking appropriately HENT: NCAT; oropharynx, MMM, no mucosal ulcerations; normal hard and soft palate Neck: Trachea midline; no lymphadenopathy, no JVD Lungs: Clear bilaterally, normal work of breathing CV: RRR, no MRGs  Abdomen: Soft, non-tender; non-distended, BS present  Extremities: No peripheral edema, radial and DP pulses present bilaterally  Skin: Normal temperature, turgor and texture; no rash Psych: Appropriate affect Neuro: Alert and oriented to person and place, no focal deficit    Vitals:   12/23/23 1017  BP: 122/66  Pulse: 68  SpO2: 95%  Weight: 212 lb (96.2 kg)  Height: 6' (1.829 m)      95% on RA BMI Readings from Last 3 Encounters:  12/23/23 28.75 kg/m  11/27/23 28.48 kg/m  09/16/23 28.56 kg/m   Wt Readings from Last 3 Encounters:  12/23/23 212 lb (96.2 kg)  11/27/23 210 lb (95.3 kg)  09/16/23 210 lb 9.6 oz (95.5 kg)     CBC    Component Value Date/Time   WBC 6.8 11/05/2021 0501   RBC 4.03 (L) 11/05/2021 0501   HGB 12.0 (L) 11/05/2021 0501   HGB 15.2 03/08/2016 1133   HGB 15.0  11/02/2014 1104   HCT 37.5 (L) 11/05/2021 0501   HCT 46.7 06/14/2018 1605   HCT 45.6 11/02/2014 1104   PLT 189 11/05/2021 0501   PLT 249 03/08/2016 1133   MCV 93.1 11/05/2021 0501   MCV 88 03/08/2016 1133   MCV 89.1 11/02/2014 1104   MCH 29.8 11/05/2021 0501   MCHC 32.0 11/05/2021 0501   RDW 14.3 11/05/2021 0501   RDW 14.0 03/08/2016 1133   RDW 13.9 11/02/2014 1104   LYMPHSABS 0.8 11/03/2021 1023   LYMPHSABS 1.2 11/02/2014 1104   MONOABS 0.9 11/03/2021 1023   MONOABS 0.5 11/02/2014 1104   EOSABS 0.1 11/03/2021 1023   EOSABS 0.2 11/02/2014 1104   BASOSABS 0.0 11/03/2021 1023   BASOSABS 0.0 11/02/2014 1104    Low level peripheral eosinophilia historically  Timothy grass, ragweed aeroallergen +  Chest Imaging: CTA Chest 04/2020 OSH report reviewed by me: no PE, +bilateral atelectasis, multiple hepatic hypodensities, small PCE   CT Chest 02/10/21: personally reviewed by me and remarkable for stable RUL gg nodule, lower lobe bronchiolitis R>L, secretions in RMB, RLL. Emphysema.  CXR 03/09/23 unchanged  Pulmonary Functions Testing Results:    Latest Ref Rng & Units 07/18/2021   11:54 AM  PFT Results  FVC-Pre L 3.79   FVC-Predicted Pre % 89   FVC-Post L 4.04   FVC-Predicted Post % 95   Pre FEV1/FVC % % 62   Post FEV1/FCV % % 62   FEV1-Pre L 2.34   FEV1-Predicted Pre % 77   FEV1-Post L 2.50   DLCO uncorrected ml/min/mmHg 19.80   DLCO UNC% % 77   DLCO corrected ml/min/mmHg 19.80   DLCO COR %Predicted % 77   DLVA Predicted % 89   TLC L 7.18   TLC % Predicted % 97   RV % Predicted % 123    07/18/21 PFT Reviewed by me ratio 62 mild obstruction, lung volumes with air trapping, very mildly reduced diffusing capacitiy  Reviewed [facility] PFTs: 01/2020 FEV1/FVC 54, post BD FEV1 82.8% without BD response.     Echocardiogram:   05/03/20 HYQ:MVHQI PCE, age appropriate diastolic parameters     Assessment & Plan:   Chronic cough: has had trials of ppi/H2B. Suspect multifactorial  with COPD, UACS with persistent postnasal drip, possible component of LPR (despite negative trial of PPI) but would have expected more edematous appearing vocal cords/folds on laryngoscopy, mild bronchiectasis. RLL changes that appear consistent with recurrent aspiration and MEP low which together with his MG raises question of weak/ineffective cough. Has responded to antibiotics in past. Overall, better than baseline.  Continue Wixela.. Per Dr. Allena Katz (neurology) would prefer to avoid LAMA if possible with MG.  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  Lung exam improved.  Improving with increased airway clearance via flutter valve. 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   MG: MEP was just below LLN. TLC,  FVC WNL 02/04/20. Periodic surveillance spiro.  RUL GG pulmonary nodule: stable since 2015. No further need for surveillance.   RTC 3 months with Dr. Orland Mustard, MD [facility] 12/23/2023 10:36 AM

## 2023-12-26 ENCOUNTER — Encounter: Payer: Self-pay | Admitting: Pulmonary Disease

## 2023-12-27 ENCOUNTER — Encounter: Payer: Self-pay | Admitting: Pulmonary Disease

## 2023-12-27 NOTE — Telephone Encounter (Signed)
 Noted.

## 2024-01-06 DIAGNOSIS — E782 Mixed hyperlipidemia: Secondary | ICD-10-CM | POA: Diagnosis not present

## 2024-01-06 DIAGNOSIS — I4891 Unspecified atrial fibrillation: Secondary | ICD-10-CM | POA: Diagnosis not present

## 2024-01-06 DIAGNOSIS — G609 Hereditary and idiopathic neuropathy, unspecified: Secondary | ICD-10-CM | POA: Diagnosis not present

## 2024-01-06 DIAGNOSIS — R7303 Prediabetes: Secondary | ICD-10-CM | POA: Diagnosis not present

## 2024-01-09 ENCOUNTER — Ambulatory Visit: Payer: Medicare Other | Attending: Cardiology | Admitting: *Deleted

## 2024-01-09 DIAGNOSIS — Z5181 Encounter for therapeutic drug level monitoring: Secondary | ICD-10-CM | POA: Diagnosis not present

## 2024-01-09 DIAGNOSIS — I4891 Unspecified atrial fibrillation: Secondary | ICD-10-CM | POA: Diagnosis not present

## 2024-01-09 LAB — POCT INR: POC INR: 2.2

## 2024-01-09 NOTE — Patient Instructions (Signed)
 Description   Continue taking warfarin 1 tablet daily except 1.5 tablets tablets on Thursdays.  Recheck INR in 6 weeks.  Call the Coumadin Clinic with any upcoming procedures or new medications.  [facility] [phone]

## 2024-01-10 ENCOUNTER — Other Ambulatory Visit: Payer: Self-pay | Admitting: Family Medicine

## 2024-01-10 DIAGNOSIS — J439 Emphysema, unspecified: Secondary | ICD-10-CM | POA: Diagnosis not present

## 2024-01-10 DIAGNOSIS — E785 Hyperlipidemia, unspecified: Secondary | ICD-10-CM | POA: Diagnosis not present

## 2024-01-10 DIAGNOSIS — G609 Hereditary and idiopathic neuropathy, unspecified: Secondary | ICD-10-CM | POA: Diagnosis not present

## 2024-01-10 DIAGNOSIS — E782 Mixed hyperlipidemia: Secondary | ICD-10-CM | POA: Diagnosis not present

## 2024-01-10 DIAGNOSIS — R7303 Prediabetes: Secondary | ICD-10-CM | POA: Diagnosis not present

## 2024-01-10 DIAGNOSIS — R252 Cramp and spasm: Secondary | ICD-10-CM

## 2024-01-10 DIAGNOSIS — G7000 Myasthenia gravis without (acute) exacerbation: Secondary | ICD-10-CM | POA: Diagnosis not present

## 2024-01-10 DIAGNOSIS — Z0000 Encounter for general adult medical examination without abnormal findings: Secondary | ICD-10-CM | POA: Diagnosis not present

## 2024-01-10 DIAGNOSIS — D3500 Benign neoplasm of unspecified adrenal gland: Secondary | ICD-10-CM | POA: Diagnosis not present

## 2024-01-10 DIAGNOSIS — I700 Atherosclerosis of aorta: Secondary | ICD-10-CM | POA: Diagnosis not present

## 2024-01-10 DIAGNOSIS — I4891 Unspecified atrial fibrillation: Secondary | ICD-10-CM | POA: Diagnosis not present

## 2024-01-10 DIAGNOSIS — J309 Allergic rhinitis, unspecified: Secondary | ICD-10-CM | POA: Diagnosis not present

## 2024-01-10 DIAGNOSIS — J449 Chronic obstructive pulmonary disease, unspecified: Secondary | ICD-10-CM | POA: Diagnosis not present

## 2024-01-23 ENCOUNTER — Ambulatory Visit
Admission: RE | Admit: 2024-01-23 | Discharge: 2024-01-23 | Disposition: A | Discharge status: Home / Self Care | Source: Ambulatory Visit | Attending: Family Medicine | Admitting: Family Medicine

## 2024-01-23 DIAGNOSIS — M79662 Pain in left lower leg: Secondary | ICD-10-CM | POA: Diagnosis not present

## 2024-01-23 DIAGNOSIS — M79661 Pain in right lower leg: Secondary | ICD-10-CM | POA: Diagnosis not present

## 2024-01-23 DIAGNOSIS — R252 Cramp and spasm: Secondary | ICD-10-CM

## 2024-01-27 DIAGNOSIS — M19071 Primary osteoarthritis, right ankle and foot: Secondary | ICD-10-CM | POA: Diagnosis not present

## 2024-01-27 DIAGNOSIS — G629 Polyneuropathy, unspecified: Secondary | ICD-10-CM | POA: Diagnosis not present

## 2024-01-27 DIAGNOSIS — R600 Localized edema: Secondary | ICD-10-CM | POA: Diagnosis not present

## 2024-01-27 DIAGNOSIS — M7732 Calcaneal spur, left foot: Secondary | ICD-10-CM | POA: Diagnosis not present

## 2024-01-27 DIAGNOSIS — M7731 Calcaneal spur, right foot: Secondary | ICD-10-CM | POA: Diagnosis not present

## 2024-01-27 DIAGNOSIS — M19072 Primary osteoarthritis, left ankle and foot: Secondary | ICD-10-CM | POA: Diagnosis not present

## 2024-01-30 ENCOUNTER — Encounter: Payer: Self-pay | Admitting: Neurology

## 2024-01-30 ENCOUNTER — Other Ambulatory Visit: Payer: Self-pay

## 2024-01-30 DIAGNOSIS — R202 Paresthesia of skin: Secondary | ICD-10-CM

## 2024-02-05 ENCOUNTER — Encounter: Payer: Self-pay | Admitting: Neurology

## 2024-02-05 DIAGNOSIS — M79671 Pain in right foot: Secondary | ICD-10-CM | POA: Diagnosis not present

## 2024-02-05 DIAGNOSIS — M79672 Pain in left foot: Secondary | ICD-10-CM | POA: Diagnosis not present

## 2024-02-05 DIAGNOSIS — M7731 Calcaneal spur, right foot: Secondary | ICD-10-CM | POA: Diagnosis not present

## 2024-02-05 DIAGNOSIS — M7732 Calcaneal spur, left foot: Secondary | ICD-10-CM | POA: Diagnosis not present

## 2024-02-13 DIAGNOSIS — I2089 Other forms of angina pectoris: Secondary | ICD-10-CM | POA: Diagnosis not present

## 2024-02-14 ENCOUNTER — Encounter: Payer: Self-pay | Admitting: Emergency Medicine

## 2024-02-14 ENCOUNTER — Ambulatory Visit (INDEPENDENT_AMBULATORY_CARE_PROVIDER_SITE_OTHER): Admitting: Pharmacist

## 2024-02-14 ENCOUNTER — Ambulatory Visit (INDEPENDENT_AMBULATORY_CARE_PROVIDER_SITE_OTHER)

## 2024-02-14 ENCOUNTER — Ambulatory Visit: Attending: Emergency Medicine | Admitting: Emergency Medicine

## 2024-02-14 VITALS — BP 110/62 | HR 78 | Ht 72.0 in | Wt 208.0 lb

## 2024-02-14 DIAGNOSIS — I480 Paroxysmal atrial fibrillation: Secondary | ICD-10-CM | POA: Diagnosis present

## 2024-02-14 DIAGNOSIS — I4891 Unspecified atrial fibrillation: Secondary | ICD-10-CM | POA: Diagnosis not present

## 2024-02-14 DIAGNOSIS — I493 Ventricular premature depolarization: Secondary | ICD-10-CM

## 2024-02-14 DIAGNOSIS — E785 Hyperlipidemia, unspecified: Secondary | ICD-10-CM | POA: Insufficient documentation

## 2024-02-14 DIAGNOSIS — Z8679 Personal history of other diseases of the circulatory system: Secondary | ICD-10-CM | POA: Diagnosis present

## 2024-02-14 DIAGNOSIS — I2510 Atherosclerotic heart disease of native coronary artery without angina pectoris: Secondary | ICD-10-CM | POA: Insufficient documentation

## 2024-02-14 DIAGNOSIS — Z7901 Long term (current) use of anticoagulants: Secondary | ICD-10-CM | POA: Insufficient documentation

## 2024-02-14 DIAGNOSIS — R42 Dizziness and giddiness: Secondary | ICD-10-CM | POA: Diagnosis present

## 2024-02-14 DIAGNOSIS — R600 Localized edema: Secondary | ICD-10-CM | POA: Insufficient documentation

## 2024-02-14 DIAGNOSIS — I471 Supraventricular tachycardia, unspecified: Secondary | ICD-10-CM | POA: Insufficient documentation

## 2024-02-14 LAB — POCT INR: INR: 2.9 (ref 2.0–3.0)

## 2024-02-14 LAB — LAB REPORT - SCANNED: EGFR: 82

## 2024-02-14 NOTE — Progress Notes (Signed)
 Cardiology Office Note:    Date:  02/14/2024  ID:  [name], DOB [birth year], MRN [id number] PCP: Leonel Cole, MD  [facility] Providers Cardiologist:  Oneil Parchment, MD       Patient Profile:      Chief Complaint: 1 year follow-up for atrial fibrillation History of Present Illness:  [name] is a 83 y.o. male with visit-pertinent history of persistent atrial fibrillation, prior pericarditis, myasthenia gravis, COPD, hyperlipidemia, PSVT s/p ablation in 2012  Diagnosed with atrial fibrillation incidentally on 05/10/2020.  Unfortunately he was unable to tolerate Eliquis  or Xarelto , currently on Coumadin .  He was diagnosed with acute pericarditis in July 2021 while in [place].  He had subtle ST segment changes noted on his EKG.  CRP was negative.  He did have a small pericardial effusion.  Did not take colchicine because of side effects however completed course of ibuprofen .  He established with cardiology service on 10/14/2018 with Dr. Parchment for preoperative cardiovascular risk assessment.  In 2012 he was seen by Dr. Waddell with EP and underwent catheter ablation of PSVT.  He was last seen in office on 03/08/2023 by Dr. Parchment.  He was currently residing in sinus rhythm.  His atorvastatin  was increased from 10 mg to 20 mg.  Overall he was doing well from a cardiac standpoint.  He was to follow-up in 1 year.   Discussed the use of AI scribe software for clinical note transcription with the patient, who gave verbal consent to proceed.  History of Present Illness Patient presents today for his 1 year follow-up however does have acute complaints.  He presents with recent episodes of lightheadedness and fatigue. The patient reports an incident during a recent trip to [place] where he felt lightheaded and had to kneel down. After resting and hydrating, the patient felt better. However, the patient's wife reports that he gets tired easily, even after short walks such as  going out to get the mail. The patient also reports increased leg swelling in the last 2-3 months. The patient is currently on warfarin for anticoagulation, prednisone  for myasthenia gravis, and Lasix as needed for leg swelling. The patient takes Lasix when he notices the swelling, which helps reduce it slightly. The patient has been on prednisone  for a long time, and the dose has been gradually reduced to 5mg  daily.   He denies chest pain, shortness of breath, palpitations, melena, hematuria, hemoptysis, diaphoresis, weakness, presyncope, syncope, orthopnea, and PND.  Review of systems:  Please see the history of present illness. All other systems are reviewed and otherwise negative.     Home Medications:    Current Meds  Medication Sig   albuterol  (VENTOLIN  HFA) 108 (90 Base) MCG/ACT inhaler Inhale 1-2 puffs into the lungs every 6 (six) hours as needed for wheezing or shortness of breath.   atorvastatin  (LIPITOR) 40 MG tablet TAKE 1 TABLET BY MOUTH EVERY DAY   Cyanocobalamin  (VITAMIN B-12 PO) Take 5,000 mcg by mouth daily.    doxazosin  (CARDURA ) 8 MG tablet Take 8 mg by mouth daily.   finasteride  (PROSCAR ) 5 MG tablet 5 mg daily.   fluticasone  (FLONASE ) 50 MCG/ACT nasal spray Place 2 sprays into both nostrils daily.   furosemide (LASIX) 20 MG tablet Take 20 mg by mouth daily.   glucosamine-chondroitin 500-400 MG tablet Take 1 tablet by mouth daily.   ipratropium (ATROVENT ) 0.03 % nasal spray PLACE 1 SPRAY INTO BOTH NOSTRILS DAILY AS NEEDED FOR RHINITIS.   metoprolol  succinate (TOPROL -XL)  50 MG 24 hr tablet Take 1 tablet (50 mg total) by mouth daily.   montelukast  (SINGULAIR ) 10 MG tablet Take 10 mg by mouth daily.   Multiple Vitamin (MULTIVITAMIN WITH MINERALS) TABS tablet Take 1 tablet by mouth daily.   Potassium Chloride  ER 20 MEQ TBCR Take 1 tablet by mouth daily.   predniSONE  (DELTASONE ) 5 MG tablet Take 1 tablet (5 mg total) by mouth daily with breakfast.   pyridostigmine  (MESTINON )  60 MG tablet Take 0.5 tablets (30 mg total) by mouth 3 (three) times daily. Take half tablet at 8am, 1pm, and 6pm   warfarin (COUMADIN ) 5 MG tablet TAKE 1 TABLET BY MOUTH DAILY OR AS DIRECTED BY COUMADIN  CLINIC   WIXELA INHUB  250-50 MCG/ACT AEPB Inhale 1 puff into the lungs 2 (two) times daily.   Current Facility-Administered Medications for the 02/14/24 encounter (Office Visit) with Rana Lum CROME, NP  Medication   triamcinolone  acetonide (KENALOG ) 10 MG/ML injection 10 mg   Studies Reviewed:   EKG Interpretation Date/Time:  Friday February 14 2024 10:45:25 EDT Ventricular Rate:  65 PR Interval:  208 QRS Duration:  112 QT Interval:  384 QTC Calculation: 399 R Axis:   -21  Text Interpretation: Sinus rhythm with Premature supraventricular complexes When compared with ECG of 16-Aug-2018 09:28, PREVIOUS ECG IS PRESENT Confirmed by Rana Lum [phone]) on 02/14/2024 10:49:40 AM    Echocardiogram 11/09/2020 1. Left ventricular ejection fraction, by estimation, is 60 to 65%. The  left ventricle has normal function. The left ventricle has no regional  wall motion abnormalities. There is mild concentric and modrate  basal-septal left ventricular hypertrophy.  Left ventricular diastolic parameters are consistent with Grade I  diastolic dysfunction (impaired relaxation).   2. Right ventricular systolic function is normal. The right ventricular  size is normal.   3. The mitral valve is normal in structure. No evidence of mitral valve  regurgitation.   4. The aortic valve is tricuspid. Aortic valve regurgitation is not  visualized. No aortic stenosis is present.   5. Aortic dilatation noted. There is mild dilatation of the aortic root,  measuring 39 mm. There is mild dilatation of the ascending aorta,  measuring 40 mm.  Risk Assessment/Calculations:             Physical Exam:   VS:  BP 110/62 (BP Location: Right Arm, Patient Position: Sitting, Cuff Size: Normal)   Pulse 78   Ht 6'  (1.829 m)   Wt 208 lb (94.3 kg)   SpO2 91%   BMI 28.21 kg/m    Wt Readings from Last 3 Encounters:  02/14/24 208 lb (94.3 kg)  12/23/23 212 lb (96.2 kg)  11/27/23 210 lb (95.3 kg)    GEN: Well nourished, well developed in no acute distress NECK: No JVD; No carotid bruits CARDIAC: RRR, no murmurs, rubs, gallops RESPIRATORY:  Clear to auscultation without rales, wheezing or rhonchi  ABDOMEN: Soft, non-tender, non-distended EXTREMITIES:  No edema; No acute deformity     Assessment and Plan:  Paroxysmal atrial fibrillation Diagnosed 05/10/2020 incidentally Denies symptoms suggestive of recurrent arrhythmia Unable to tolerate Eliquis  or Xarelto , currently on Coumadin  - Continue metoprolol  XL 50 mg daily - Continue Coumadin  per Coumadin  clinic  CHA2DS2-VASc Score = 4 [CHF History: 0, HTN History: 1, Diabetes History: 0, Stroke History: 0, Vascular Disease History: 1, Age Score: 2, Gender Score: 0].  Therefore, the patient's annual risk of stroke is 4.8 %.      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  He also brings in his EKG from PCP visit earlier this week that showed frequent PVCs - Plan for 7-day ZIO monitor to assess PVC burden - Will consider beta-blocker therapy based on ZIO results  H/o pericarditis Diagnosed July 2021.  Did have subtle ST segment changes, CRP negative, did have small pericardial effusion, did not take colchicine, completed course of ibuprofen  Follow-up echocardiogram 10/2020 showed normal LV function without pericardial effusion - Remains stable without complaints of angina  PSVT S/p ablation in 2012 by Dr. Waddell - Today he denies symptoms of recurrent SVT and remains stable - Continue metoprolol  XL 50 mg daily  Coronary artery calcification / Hyperlipidemia LDL 66, HDL 53, TC 138, TG 110 on 03/7973 Coronary artery calcification noted on CT scan for lung cancer screening - LDL currently under excellent control and under goal less than 70 - Today he  denies any anginal symptoms, no indication for further ischemic evaluation at this time - Continue atorvastatin  40 mg daily  Lightheadedness / Fatigue / Leg swelling Recent episode of lightheadedness that occurred while on vacation earlier this week.  Lightheadedness resolved after patient rested and drank fluids.  Also notes to have increased leg swelling and pedal edema over the last 2 to 3 months.  He also endorses chronic fatigue which seems to have worsened over the past several weeks.  He denies any anginal symptoms such as chest pains, dyspnea on exertion, syncope - There is a possibility daily prednisone  could be exacerbating his pedal edema and his myasthenia gravis likely contributing to his chronic fatigue - Plan for echocardiogram to reevaluate LV function - Zio patch for PVC burden and other arrhythmias as noted above - Will change Lasix from as needed to 20 mg daily and repeat BMET at follow-up visit  Myasthenia gravis - Stable on prednisone , managed by Dr. Tobie       Dispo:  Return in about 8 weeks (around 04/10/2024).  Signed, Lum LITTIE Louis, NP

## 2024-02-14 NOTE — Patient Instructions (Addendum)
 Medication Instructions:  INCREASE LASIX TO 20 MG DAILY.   Lab Work: NONE   Testing/Procedures: Your physician has requested that you have an echocardiogram. Echocardiography is a painless test that uses sound waves to create images of your heart. It provides your doctor with information about the size and shape of your heart and how well your heart's chambers and valves are working. This procedure takes approximately one hour. There are no restrictions for this procedure. Please do NOT wear cologne, perfume, aftershave, or lotions (deodorant is allowed). Please arrive 15 minutes prior to your appointment time.  Please note: We ask at that you not bring children with you during ultrasound (echo/ vascular) testing. Due to room size and safety concerns, children are not allowed in the ultrasound rooms during exams. Our front office staff cannot provide observation of children in our lobby area while testing is being conducted. An adult accompanying a patient to their appointment will only be allowed in the ultrasound room at the discretion of the ultrasound technician under special circumstances. We apologize for any inconvenience.  ZIO XT- Long Term Monitor Instructions  Your physician has requested you wear a ZIO patch monitor for 7 days.  This is a single patch monitor. [employer] supplies one patch monitor per enrollment. Additional stickers are not available. Please do not apply patch if you will be having a Nuclear Stress Test,  Echocardiogram, Cardiac CT, MRI, or Chest Xray during the period you would be wearing the  monitor. The patch cannot be worn during these tests. You cannot remove and re-apply the  ZIO XT patch monitor.  Your ZIO patch monitor will be mailed 3 day USPS to your address on file. It may take 3-5 days  to receive your monitor after you have been enrolled.  Once you have received your monitor, please review the enclosed instructions. Your monitor  has already been  registered assigning a specific monitor serial # to you.  Billing and Patient Assistance Program Information  We have supplied [employer] with any of your insurance information on file for billing purposes. [employer] offers a sliding scale Patient Assistance Program for patients that do not have  insurance, or whose insurance does not completely cover the cost of the ZIO monitor.  You must apply for the Patient Assistance Program to qualify for this discounted rate.  To apply, please call [employer] at [phone], select option 4, select option 2, ask to apply for  Patient Assistance Program. [name] will ask your household income, and how many people  are in your household. They will quote your out-of-pocket cost based on that information.  [employer] will also be able to set up a 35-month, interest-free payment plan if needed.  Applying the monitor   Shave hair from upper left chest.  Hold abrader disc by orange tab. Rub abrader in 40 strokes over the upper left chest as  indicated in your monitor instructions.  Clean area with 4 enclosed alcohol pads. Let dry.  Apply patch as indicated in monitor instructions. Patch will be placed under collarbone on left  side of chest with arrow pointing upward.  Rub patch adhesive wings for 2 minutes. Remove white label marked "1". Remove the white  label marked "2". Rub patch adhesive wings for 2 additional minutes.  While looking in a mirror, press and release button in center of patch. A small green light will  flash 3-4 times. This will be your only indicator that the monitor has been turned on.  Do not  shower for the first 24 hours. You may shower after the first 24 hours.  Press the button if you feel a symptom. You will hear a small click. Record Date, Time and  Symptom in the Patient Logbook.  When you are ready to remove the patch, follow instructions on the last 2 pages of Patient  Logbook. Stick patch monitor onto the last page of Patient  Logbook.  Place Patient Logbook in the blue and white box. Use locking tab on box and tape box closed  securely. The blue and white box has prepaid postage on it. Please place it in the mailbox as  soon as possible. Your physician should have your test results approximately 7 days after the  monitor has been mailed back to [facility]  Call [facility] Customer Care at [phone] if you have questions regarding  your ZIO XT patch monitor. Call them immediately if you see an orange light blinking on your  monitor.  If your monitor falls off in less than 4 days, contact our Monitor department at [phone].  If your monitor becomes loose or falls off after 4 days call [employer] at [phone] for  suggestions on securing your monitor   Follow-Up: At [facility], you and your health needs are our priority.  As part of our continuing mission to provide you with exceptional heart care, our providers are all part of one team.  This team includes your primary Cardiologist (physician) and Advanced Practice Providers or APPs (Physician Assistants and Nurse Practitioners) who all work together to provide you with the care you need, when you need it.  Your next appointment:   8 week(s)  Provider:   Palmer Bobo, DNP   Other Instructions:    1st Floor: - Lobby - Registration  - Pharmacy  - Lab - Cafe  2nd Floor: - PV Lab - Diagnostic Testing (echo, CT, nuclear med)  3rd Floor: - Vacant  4th Floor: - TCTS (cardiothoracic surgery) - AFib Clinic - Structural Heart Clinic - Vascular Surgery  - Vascular Ultrasound  5th Floor: - HeartCare Cardiology (general and EP) - Clinical Pharmacy for coumadin , hypertension, lipid, weight-loss medications, and med management appointments    Valet parking services will be available as well.

## 2024-02-14 NOTE — Patient Instructions (Signed)
 Description   Continue taking warfarin 1 tablet daily except 1.5 tablets tablets on Thursdays.  Recheck INR in 6 weeks.  Call the Coumadin Clinic with any upcoming procedures or new medications.  [facility] [phone]

## 2024-02-14 NOTE — Progress Notes (Unsigned)
 Enrolled patient for a 7 day Zio XT monitor to be mailed to patients home   Skains to read

## 2024-02-15 ENCOUNTER — Encounter: Payer: Self-pay | Admitting: Emergency Medicine

## 2024-02-20 ENCOUNTER — Ambulatory Visit: Payer: Medicare Other | Admitting: Podiatry

## 2024-02-20 ENCOUNTER — Ambulatory Visit

## 2024-02-27 ENCOUNTER — Ambulatory Visit (HOSPITAL_COMMUNITY): Attending: Emergency Medicine

## 2024-02-27 DIAGNOSIS — R600 Localized edema: Secondary | ICD-10-CM | POA: Insufficient documentation

## 2024-02-27 LAB — ECHOCARDIOGRAM COMPLETE
AR max vel: 3.74 cm2
AV Area VTI: 3.61 cm2
AV Area mean vel: 3.22 cm2
AV Mean grad: 3 mmHg
AV Peak grad: 4.8 mmHg
Ao pk vel: 1.1 m/s
Area-P 1/2: 2.6 cm2
Calc EF: 57.2 %
S' Lateral: 5.1 cm
Single Plane A2C EF: 60.5 %
Single Plane A4C EF: 51.7 %

## 2024-03-03 ENCOUNTER — Ambulatory Visit: Admitting: Podiatry

## 2024-03-04 DIAGNOSIS — L570 Actinic keratosis: Secondary | ICD-10-CM | POA: Diagnosis not present

## 2024-03-04 DIAGNOSIS — D225 Melanocytic nevi of trunk: Secondary | ICD-10-CM | POA: Diagnosis not present

## 2024-03-04 DIAGNOSIS — Z85828 Personal history of other malignant neoplasm of skin: Secondary | ICD-10-CM | POA: Diagnosis not present

## 2024-03-04 DIAGNOSIS — D492 Neoplasm of unspecified behavior of bone, soft tissue, and skin: Secondary | ICD-10-CM | POA: Diagnosis not present

## 2024-03-04 DIAGNOSIS — L218 Other seborrheic dermatitis: Secondary | ICD-10-CM | POA: Diagnosis not present

## 2024-03-04 DIAGNOSIS — L814 Other melanin hyperpigmentation: Secondary | ICD-10-CM | POA: Diagnosis not present

## 2024-03-04 DIAGNOSIS — Z08 Encounter for follow-up examination after completed treatment for malignant neoplasm: Secondary | ICD-10-CM | POA: Diagnosis not present

## 2024-03-04 DIAGNOSIS — L821 Other seborrheic keratosis: Secondary | ICD-10-CM | POA: Diagnosis not present

## 2024-03-04 DIAGNOSIS — D044 Carcinoma in situ of skin of scalp and neck: Secondary | ICD-10-CM | POA: Diagnosis not present

## 2024-03-05 ENCOUNTER — Ambulatory Visit (INDEPENDENT_AMBULATORY_CARE_PROVIDER_SITE_OTHER): Admitting: Neurology

## 2024-03-05 DIAGNOSIS — G609 Hereditary and idiopathic neuropathy, unspecified: Secondary | ICD-10-CM

## 2024-03-05 DIAGNOSIS — R202 Paresthesia of skin: Secondary | ICD-10-CM

## 2024-03-05 NOTE — Procedures (Signed)
 [facility]  [address] [place], [address]  [place], [place] [address] Tel: [phone] Fax: [phone] Test Date:  03/05/2024  Patient: [name] DOB: 10/26/1941 Physician: Reyna Cava, DO  Sex: Male Height: 6\' 0"  Ref Phys: Benedetto Brady, MD  ID#: [id number]   Technician:    History: This is a 83 year old man with history of neuropathy referred for evaluation of leg cramps.  NCV & EMG Findings: Extensive electrodiagnostic testing of the right lower extremity and additional studies of the left shows:  Bilateral sural and superficial peroneal sensory responses are absent. Bilateral peroneal (EBD) and tibial motor responses are absent.  Bilateral peroneal motor responses at the tibialis anterior are within normal limits. Bilateral tibial H reflex studies are absent. Chronic motor axonal loss changes are seen affecting bilateral anterior tibialis, gastrocnemius, and rectus femoris muscles, without accompanying active denervation.  Proximal and deep muscles were not tested as the patient is on anticoagulation therapy.  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 Superimposed multilevel lumbosacral radiculopathy cannot be excluded.  Correlate clinically.   ___________________________ Reyna Cava, DO    Nerve Conduction Studies   Stim Site NR Peak (ms) Norm Peak (ms) O-P Amp (V) Norm O-P Amp  Left Sup Peroneal Anti Sensory (Ant Lat Mall)  32 C  12 cm *NR  <4.6  >3  Right Sup Peroneal Anti Sensory (Ant Lat Mall)  32 C  12 cm *NR  <4.6  >3  Left Sural Anti Sensory (Lat Mall)  32 C  Calf *NR  <4.6  >3  Right Sural Anti Sensory (Lat Mall)  32 C  Calf *NR  <4.6  >3     Stim Site NR Onset (ms) Norm Onset (ms) O-P Amp (mV) Norm O-P Amp Site1 Site2 Delta-0 (ms) Dist (cm) Vel (m/s) Norm Vel (m/s)  Left Peroneal Motor (Ext Dig Brev)  32 C  Ankle *NR  <6.0  >2.5 B Fib Ankle  0.0  >40   B Fib *NR     Poplt B Fib  0.0  >40  Poplt *NR            Right Peroneal Motor (Ext Dig Brev)  32 C  Ankle *NR  <6.0  >2.5 B Fib Ankle  0.0  >40  B Fib *NR     Poplt B Fib  0.0  >40  Poplt *NR            Left Peroneal TA Motor (Tib Ant)  32 C  Fib Head    3.4 <4.5 3.5 >3 Poplit Fib Head 1.8 10.0 56 >40  Poplit    5.2 <5.7 3.0         Right Peroneal TA Motor (Tib Ant)  32 C  Fib Head    3.0 <4.5 4.1 >3 Poplit Fib Head 1.8 10.0 56 >40  Poplit    4.8 <5.7 3.9         Left Tibial Motor (Abd Hall Brev)  32 C  Ankle *NR  <6.0  >4 Knee Ankle  0.0  >40  Knee *NR            Right Tibial Motor (Abd Hall Brev)  32 C  Ankle *NR  <6.0  >4 Knee Ankle  0.0  >40  Knee *NR             Electromyography   Side Muscle Ins.Act Fibs Fasc Recrt Amp Dur Poly Activation Comment  Right AntTibialis Nml Nml  Nml *2- *1+ *1+ *1+ Nml N/A  Right Gastroc Nml Nml Nml *2- *1+ *1+ *1+ Nml N/A  Right RectFemoris Nml Nml Nml *1- *1+ *1+ *1+ Nml N/A  Right BicepsFemS Nml Nml Nml Nml Nml Nml Nml Nml N/A  Left AntTibialis Nml Nml Nml *2- *1+ *1+ *1+ Nml N/A  Left Gastroc Nml Nml Nml *2- *1+ *1+ *1+ Nml N/A  Left RectFemoris Nml Nml Nml *1- *1+ *1+ *1+ Nml N/A  Left BicepsFemS Nml Nml Nml Nml Nml Nml Nml Nml N/A      Waveforms:

## 2024-03-09 ENCOUNTER — Encounter: Payer: Self-pay | Admitting: Cardiology

## 2024-03-10 ENCOUNTER — Other Ambulatory Visit: Payer: Self-pay | Admitting: *Deleted

## 2024-03-10 MED ORDER — METOPROLOL SUCCINATE ER 50 MG PO TB24: 50.0000 mg | ORAL_TABLET | Freq: Every day | ORAL | 3 refills | Status: AC

## 2024-03-11 ENCOUNTER — Ambulatory Visit: Admitting: Podiatry

## 2024-03-21 ENCOUNTER — Other Ambulatory Visit: Payer: Self-pay | Admitting: Neurology

## 2024-03-25 ENCOUNTER — Encounter: Payer: Self-pay | Admitting: Neurology

## 2024-03-25 ENCOUNTER — Ambulatory Visit (INDEPENDENT_AMBULATORY_CARE_PROVIDER_SITE_OTHER): Payer: Medicare Other | Admitting: Neurology

## 2024-03-25 VITALS — BP 96/70 | HR 86 | Ht 72.0 in | Wt 210.0 lb

## 2024-03-25 DIAGNOSIS — G609 Hereditary and idiopathic neuropathy, unspecified: Secondary | ICD-10-CM | POA: Diagnosis not present

## 2024-03-25 DIAGNOSIS — G7000 Myasthenia gravis without (acute) exacerbation: Secondary | ICD-10-CM

## 2024-03-25 MED ORDER — PREDNISONE 5 MG PO TABS: 5.0000 mg | ORAL_TABLET | Freq: Every day | ORAL | 3 refills | Status: DC

## 2024-03-25 NOTE — Progress Notes (Signed)
 Follow-up Visit   Date: 03/25/24    [name] MRN: [id number] DOB: 12/17/1940   Interim History: [name] is a 83 y.o. right-handed Caucasian male with hyperlipidemia, COPD, peripheral neuropathy, SVT s/p ablation, afib on anticoagulation, BPH, former smoker, and diverticulitis returning to the clinic for follow-up of myasthenia gravis.  The patient was accompanied to the clinic by wife.   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  Initially, hospitalized in Sept 2019, responsive to IVIG and prednisone  30mg /d.  Since this time, he has been on a tapering dose prednisone  and tolerated it well.  There has been no breakthrough weakness.  His exam shows no evidence of disease manifestation.    - Continue prednisone  5mg  daily.  Plan to taper slowly in the fall. - Continue mestinon  30mg  three times daily  2.  Idiopathic peripheral neuropathy affecting the feet manifesting with numbness, stable.   3.  Multilevel lumbar spondylosis.  Stable.  He is followed by [facility] and has received ESI in the past.   4.  Low blood pressure, asymptomatic.  Patient encouraged to monitor at home and follow-up with cardiologist, if he becomes lightheaded.   Return to clinic in 4 months  ---------------------------------------------------------------------- History of present illness: Patient was diagnosed with myasthenia gravis in August 2019 after presenting with left ptosis, facial weakness, double vision, and dysphagia. He went to the ER for these symptoms where stroke was excluded and AChR antibodies returned positive. He was started on mestinon  30mg  three times daily (8am, 4pm, and midnight).  He was started on prednisone  20mg , with no improvement.  He was hospitalized from 9/19 - 07/23/2018 with MG exacerbated and treated with IVIG with great response. His prednisone  was increased to 30mg  daily and he was started  on IVIG, which helped control MG symptoms. During 2020, his prednisone  was tapered and he has been on prednisone  10mg  daily.    He also complains of severe neck pain which is worse with prolonged standing and neck extension.  He usually has to sit back down and rest for relief. He does not have numbness/tingling of the hands or weakness.    He has previously been evaluated for neuropathy at [facility] and [facility] with NCS/EMG which showed peripheral sensorimotor axonal neuropathy.  CSF testing was normal and genetic testing was normal.    UPDATE 05/25/2021:   He was at the beach last week and did well in the hot temperatures. His wife states he has rare spells of difficulty swallowing, but also feels he may not be chewing his food.  He is compliant with prednisone  10mg  daily and mestinon  30mg  three times daily.  UPDATE 09/17/2022:  He is on prednisone  8mg  since the beginning of the month and denies any new weakness.  UPDATE 01/01/2023:  He is here for follow-up visit.  He remains on prednisone  8mg .  He is tolerating this well and denies weakness, droopy eyelids, or double vision.   Wife says that after eating his food, he tends to cough for some time, but there is no choking.  He has a few areas on his skin resected for precancerous lesions and is doing well.   UPDATE 04/16/2023:  He is here for follow-up visit.  He has been on prednisone  7mg  for the past several months and doing very well.  He denies weakness, vision change, or ptosis.    UPDATE 08/27/2023:  He is here for follow-up visit. He remains on prednisone  7mg  and  mestinon  30mg  three times daily and denies any issues with double vision, ptosis, difficulty with swallow/speech, or limb weakness. He continues to remain highly independent and sometimes gets tired easily.   UPDATE 11/27/2023:  He is here for follow-up visit.  He has been on prednisone  6mg  and denies any new complaints.  No double vision, ptosis, speech/swallow difficulty or limb  weakness.  He also takes mestinon  30mg  three times daily.  He occasionally has problems with swallowing food.   UPDATE 03/25/2024:  He is here for follow-up visit and doing well.  He has been on prednisone  5mg  and tolerating this well.  No double vision, ptosis, speech/swallow difficulty or limb weakness.  He denies significant back pain.  He has numbness in the feet, which is unchanged.   Medications:  Current Outpatient Medications on File Prior to Visit  Medication Sig Dispense Refill   albuterol  (VENTOLIN  HFA) 108 (90 Base) MCG/ACT inhaler Inhale 1-2 puffs into the lungs every 6 (six) hours as needed for wheezing or shortness of breath. 18 g 5   atorvastatin  (LIPITOR) 40 MG tablet TAKE 1 TABLET BY MOUTH EVERY DAY 90 tablet 3   Cyanocobalamin  (VITAMIN B-12 PO) Take 5,000 mcg by mouth daily.      doxazosin  (CARDURA ) 8 MG tablet Take 8 mg by mouth daily.     finasteride  (PROSCAR ) 5 MG tablet 5 mg daily.     fluticasone  (FLONASE ) 50 MCG/ACT nasal spray Place 2 sprays into both nostrils daily.     furosemide (LASIX) 20 MG tablet Take 20 mg by mouth daily.     glucosamine-chondroitin 500-400 MG tablet Take 1 tablet by mouth daily.     ipratropium (ATROVENT ) 0.03 % nasal spray PLACE 1 SPRAY INTO BOTH NOSTRILS DAILY AS NEEDED FOR RHINITIS. 90 mL 1   metoprolol  succinate (TOPROL -XL) 50 MG 24 hr tablet Take 1 tablet (50 mg total) by mouth daily. 90 tablet 3   montelukast  (SINGULAIR ) 10 MG tablet Take 10 mg by mouth daily.     Multiple Vitamin (MULTIVITAMIN WITH MINERALS) TABS tablet Take 1 tablet by mouth daily.     Potassium Chloride  ER 20 MEQ TBCR Take 1 tablet by mouth daily.     predniSONE  (DELTASONE ) 5 MG tablet Take 1 tablet (5 mg total) by mouth daily with breakfast. 90 tablet 3   pyridostigmine  (MESTINON ) 60 MG tablet Take 0.5 tablets (30 mg total) by mouth 3 (three) times daily. Take half tablet at 8am, 1pm, and 6pm 135 tablet 3   warfarin (COUMADIN ) 5 MG tablet TAKE 1 TABLET BY MOUTH DAILY  OR AS DIRECTED BY COUMADIN  CLINIC 90 tablet 1   WIXELA INHUB 250-50 MCG/ACT AEPB Inhale 1 puff into the lungs 2 (two) times daily.     Current Facility-Administered Medications on File Prior to Visit  Medication Dose Route Frequency Provider Last Rate Last Admin   triamcinolone  acetonide (KENALOG ) 10 MG/ML injection 10 mg  10 mg Intramuscular Once         Allergies:  Allergies  Allergen Reactions   Ambrosia Trifida (Tall Ragweed) Allergy  Skin Test Anaphylaxis   Bee Pollen Itching    Nasal congestion   Beta Adrenergic Blockers     Other reaction(s): MG   Ciprofloxacin      Other reaction(s): myasthenia gravis   Eliquis  [Apixaban ]     Throat tightening   Erythromycin     Other reaction(s): MG   Pollen Extract Other (See Comments)    Nasal congestion    Vital Signs:  BP 96/70 (BP Location: Right Arm, Patient Position: Sitting)   Pulse 86   Ht 6' (1.829 m)   Wt 210 lb (95.3 kg)   SpO2 93%   BMI 28.48 kg/m   Neurological Exam: MENTAL STATUS including orientation to time, place, person, recent and remote memory, attention span and concentration, language, and fund of knowledge is normal.  Speech is not dysarthric.  CRANIAL NERVES:   Normal conjugate, extra-ocular eye movements in all directions of gaze.  Mild ptosis (stable)  Face is symmetric.  Orbicularis oculi, buccinator, and orbicularis oris is 5/5.  Tongue strength is 5/5  MOTOR:  Motor strength is 5/5 in all extremities.  No fatigability.   REFLEXES:  Reflexes are 2+/4 throughout, except absent at the ankles.  SENSATION:  Vibration intact throughout except absent at the ankles.   COORDINATION/GAIT:    Gait narrow based, stooped, stable, unassisted.   Data: Labs 06/15/2018:  AChR binding 1.69*, MUSK neg   MRI brain wo contrast 06/14/2018: Negative for acute infarct. 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   CTA head and neck 06/14/2018: 1. Negative  for large vessel occlusion, dissection, or arterial stenosis in the head or neck. There is minimal atherosclerosis. 2. Positive for generalized arterial tortuosity. Ectatic distal aortic arch and proximal descending aorta. 3. A small 15 mm area of ground-glass opacity in the right upper lobe appears stable since 2015. 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   NCS/EMG of the legs 03/13/2018:  This is an abnormal study. 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   CT chest 07/11/2018:  Negative for thymoma  MRI cervical spine wo contrast 07/14/2018: 1. No acute finding. 2. Disc and facet degeneration causes foraminal impingement that is advanced on the left at c3-4 and on the left at C5-6. 3. Mild right foraminal narrowing at C5-6 and C6-7.  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 Superimposed multilevel lumbosacral radiculopathy cannot be excluded.  Correlate clinically.     Thank you for allowing me to participate in patient's care.  If I can answer any additional questions, I would be pleased to do so.    Sincerely,    Marshall Kampf K. Lydia Sams, DO

## 2024-03-25 NOTE — Patient Instructions (Signed)
 Continue prednisone  5mg  daily and mestinon  30mg  three times daily  Monitor blood pressure at home and if it stays low or you develop lightheadedness, follow-up with your cardiologist

## 2024-03-26 ENCOUNTER — Encounter

## 2024-03-26 ENCOUNTER — Ambulatory Visit (INDEPENDENT_AMBULATORY_CARE_PROVIDER_SITE_OTHER): Payer: Medicare Other | Admitting: Pulmonary Disease

## 2024-03-26 ENCOUNTER — Encounter: Payer: Self-pay | Admitting: Pulmonary Disease

## 2024-03-26 VITALS — BP 133/81 | HR 70 | Ht 72.0 in | Wt 209.2 lb

## 2024-03-26 DIAGNOSIS — J479 Bronchiectasis, uncomplicated: Secondary | ICD-10-CM

## 2024-03-26 DIAGNOSIS — J449 Chronic obstructive pulmonary disease, unspecified: Secondary | ICD-10-CM

## 2024-03-26 DIAGNOSIS — J301 Allergic rhinitis due to pollen: Secondary | ICD-10-CM

## 2024-03-26 NOTE — Patient Instructions (Signed)
 No changes  Cough consistently worsens for a few days let me know and I can call in the antibiotic again  Return to clinic in 6 months or sooner as needed with Dr. Marygrace Snellen

## 2024-03-26 NOTE — Progress Notes (Signed)
 Synopsis: Referred for chronic cough by Benedetto Brady, MD  Subjective:   PATIENT ID: [name] GENDER: male DOB: 1941/08/14, MRN: [id number]  Chief Complaint  Patient presents with   Follow-up    No improvement on cough  CC: cough  83 y.o. 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 Never has lived anywhere else other than [place], [place]. Pets: No pets. No birds. No feather pillows or down comforters.  Travel: none pertinent  Had ablation for some form of SVT vs AF about 6 years ago.   Interval HPI:  Relatively stable.  Cough seems stable.  He reports no better or no worse.  Tried the Augmentin  interim without really improvement.  He notes a lot of increased sneezing seasonal allergy  symptoms.  Likely seasonal allergies contribute to postnasal drip etc. right recent antibiotic course did improve things.  We discussed trying antibiotics again if cough were to consistently worsen for 2 to 3 days.   Past Medical History:  Diagnosis Date   Abscess of sigmoid colon due to diverticulitis 12/26/2018   Appendicitis 09/05/2014   Basal cell carcinoma 10/04/2015   left deltoid-[id number]   BCC (basal cell carcinoma) 11/03/2019   left shin (txpbx)   Colon polyp    Complication of anesthesia    Difficult to arouse, Combative x 1   COVID    Diverticulitis    Diverticulosis    extensive 3'15.   Dysrhythmia    hx. 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  Many polyps.  rec f/u colonoscopy summer 2020   EUS N/A 11/17/2014   Procedure: ESOPHAGEAL ENDOSCOPIC ULTRASOUND (EUS) RADIAL;  Surgeon: Evangeline Hilts, MD;  Location: [facility];  Service: Endoscopy;  Laterality: N/A;   FINE NEEDLE ASPIRATION N/A 11/17/2014   Procedure: FINE NEEDLE ASPIRATION (FNA) LINEAR;  Surgeon: Evangeline Hilts, MD;  Location: [facility];  Service: Endoscopy;  Laterality: N/A;  + or- fna   FLEXIBLE SIGMOIDOSCOPY     HEMORRHOID SURGERY  banding    INGUINAL HERNIA REPAIR Bilateral 2005   '05 bilateral hernia   KNEE ARTHROSCOPY Left 2008   scope '08   LAPAROSCOPIC APPENDECTOMY N/A 09/05/2014   Procedure: APPENDECTOMY LAPAROSCOPIC;  Surgeon: Oza Blumenthal, MD;  Location: [facility];  Service: General;  Laterality: N/A;   PROCTOSCOPY N/A 12/26/2018   Procedure: RIGID PROCTOSCOPY;  Surgeon: Candyce Champagne, MD;  Location: [facility];  Service: General;  Laterality: N/A;   ROTATOR CUFF REPAIR Right 2016    Social History   Socioeconomic History   Marital status: Married    Spouse name: Not on file   Number of children: 3   Years of education: 14   Highest education level: Some college, no degree  Occupational History    Occupation: sales-Retired, works one day a week  Tobacco Use   Smoking status: Former    Current packs/day: 0.00    Average packs/day: 1 pack/day for 47.0 years (47.0 ttl pk-yrs)    Types: Cigarettes    Start date: 10/29/1953    Quit date: 10/29/2000    Years since quitting: 23.4   Smokeless tobacco: Never  Vaping Use   Vaping status: Never Used  Substance and Sexual Activity   Alcohol use: Not Currently    Alcohol/week: 1.0 standard drink of alcohol    Types: 1 Standard drinks or equivalent per week    Comment: socially- nightly 1 cocktail   Drug use: No   Sexual activity: Not on file  Other Topics Concern   Not on file  Social History Narrative   Lives with wife in a one story home with a basement.  Has 3 daughters.  Works one day a week at the [employer].  Retired from Airline pilot.  Education: some college.    Caffeine use: decaf   Right handed   One story home   Social Drivers of Health   Financial Resource Strain: Low Risk  (05/04/2020)   Received from [facility]   Overall Financial Resource Strain (CARDIA)    Difficulty of Paying Living Expenses: Not hard at all  Food Insecurity: No Food Insecurity (05/04/2020)   Received from [facility]   Hunger Vital Sign    Worried About Running Out of Food in the Last Year: Never true    Ran Out of Food in the Last Year: Never true  Transportation Needs: No Transportation Needs (05/04/2020)   Received from [facility]   [employer] - Administrator, Civil Service (Medical): No    Lack of Transportation (Non-Medical): No  Physical Activity: Not on file  Stress: Not on file  Social Connections: Moderately Isolated (05/04/2020)   [facility]   Social Connection and Isolation Panel [NHANES]    Frequency of Communication with Friends and Family: More than three times  a week    Frequency of Social Gatherings with Friends and Family: Twice a week    Attends Religious Services: Never    Database administrator or Organizations: No    Attends Engineer, structural: Never    Marital Status: Married  Catering manager Violence: Not on file     Allergies  Allergen Reactions   Ambrosia Trifida (Tall Ragweed) Allergy  Skin Test Anaphylaxis   Bee Pollen Itching    Nasal congestion   Beta Adrenergic Blockers     Other reaction(s): MG   Ciprofloxacin   Other reaction(s): myasthenia gravis   Eliquis  [Apixaban ]     Throat tightening   Erythromycin     Other reaction(s): MG   Pollen Extract Other (See Comments)    Nasal congestion     Outpatient Medications Prior to Visit  Medication Sig Dispense Refill   albuterol  (VENTOLIN  HFA) 108 (90 Base) MCG/ACT inhaler Inhale 1-2 puffs into the lungs every 6 (six) hours as needed for wheezing or shortness of breath. 18 g 5   atorvastatin  (LIPITOR) 40 MG tablet TAKE 1 TABLET BY MOUTH EVERY DAY 90 tablet 3   Cyanocobalamin  (VITAMIN B-12 PO) Take 5,000 mcg by mouth daily.      doxazosin  (CARDURA ) 8 MG tablet Take 8 mg by mouth daily.     finasteride  (PROSCAR ) 5 MG tablet 5 mg daily.     fluticasone  (FLONASE ) 50 MCG/ACT nasal spray Place 2 sprays into both nostrils daily.     furosemide (LASIX) 20 MG tablet Take 20 mg by mouth daily.     glucosamine-chondroitin 500-400 MG tablet Take 1 tablet by mouth daily.     ipratropium (ATROVENT ) 0.03 % nasal spray PLACE 1 SPRAY INTO BOTH NOSTRILS DAILY AS NEEDED FOR RHINITIS. 90 mL 1   metoprolol  succinate (TOPROL -XL) 50 MG 24 hr tablet Take 1 tablet (50 mg total) by mouth daily. 90 tablet 3   montelukast  (SINGULAIR ) 10 MG tablet Take 10 mg by mouth daily.     Multiple Vitamin (MULTIVITAMIN WITH MINERALS) TABS tablet Take 1 tablet by mouth daily.     Potassium Chloride  ER 20 MEQ TBCR Take 1 tablet by mouth daily.     predniSONE  (DELTASONE ) 5 MG tablet Take 1 tablet (5 mg  total) by mouth daily with breakfast. 90 tablet 3   pyridostigmine  (MESTINON ) 60 MG tablet TAKE 0.5 TABLETS (30 MG TOTAL) BY MOUTH 3 (THREE) TIMES DAILY. TAKE HALF TABLET AT 8AM, 1PM, AND 6PM 135 tablet 3   warfarin (COUMADIN ) 5 MG tablet TAKE 1 TABLET BY MOUTH DAILY OR AS DIRECTED BY COUMADIN  CLINIC 90 tablet 1   WIXELA INHUB 250-50 MCG/ACT AEPB Inhale 1 puff into the lungs 2 (two) times daily.     Facility-Administered Medications Prior to Visit  Medication Dose Route Frequency Provider Last Rate Last Admin   triamcinolone  acetonide (KENALOG ) 10 MG/ML injection 10 mg  10 mg Intramuscular Once          Objective:  BP 133/81 (BP Location: Right Leg, Patient Position: Sitting, Cuff Size: Normal)   Pulse 70   Ht 6' (1.829 m)   Wt 209 lb 3.2 oz (94.9 kg)   SpO2 93%   BMI 28.37 kg/m  Physical Exam:  General appearance: 83 y.o., male, NAD, conversant  Eyes: anicteric sclerae, moist conjunctivae; no lid-lag; PERRL, tracking appropriately HENT: NCAT no icterus Neck: Trachea midline; no lymphadenopathy, no JVD Lungs: Clear bilaterally, normal work of breathing CV: RRR, no MRGs  Abdomen: Soft, non-tender; non-distended, BS present  Extremities: No peripheral edema, radial and DP pulses present bilaterally  Skin: Normal temperature, turgor and texture; no rash Psych: Appropriate affect Neuro: Alert and oriented to person and place, no focal deficit   BMI Readings from Last 3 Encounters:  03/26/24 28.37 kg/m  03/25/24 28.48 kg/m  02/14/24 28.21 kg/m   Wt Readings from Last 3 Encounters:  03/26/24 209 lb 3.2 oz (94.9 kg)  03/25/24 210 lb (95.3 kg)  02/14/24 208 lb (94.3 kg)    Labs personally reviewed CBC    Component Value Date/Time  WBC 6.8 11/05/2021 0501   RBC 4.03 (L) 11/05/2021 0501   HGB 12.0 (L) 11/05/2021 0501   HGB 15.2 03/08/2016 1133   HGB 15.0 11/02/2014 1104   HCT 37.5 (L) 11/05/2021 0501   HCT 46.7 06/14/2018 1605   HCT 45.6 11/02/2014 1104   PLT 189  11/05/2021 0501   PLT 249 03/08/2016 1133   MCV 93.1 11/05/2021 0501   MCV 88 03/08/2016 1133   MCV 89.1 11/02/2014 1104   MCH 29.8 11/05/2021 0501   MCHC 32.0 11/05/2021 0501   RDW 14.3 11/05/2021 0501   RDW 14.0 03/08/2016 1133   RDW 13.9 11/02/2014 1104   LYMPHSABS 0.8 11/03/2021 1023   LYMPHSABS 1.2 11/02/2014 1104   MONOABS 0.9 11/03/2021 1023   MONOABS 0.5 11/02/2014 1104   EOSABS 0.1 11/03/2021 1023   EOSABS 0.2 11/02/2014 1104   BASOSABS 0.0 11/03/2021 1023   BASOSABS 0.0 11/02/2014 1104    Low level peripheral eosinophilia historically  Timothy grass, ragweed aeroallergen +  Chest Imaging: CTA Chest 04/2020 OSH report reviewed by me: no PE, +bilateral atelectasis, multiple hepatic hypodensities, small PCE   CT Chest 02/10/21: personally reviewed by me and remarkable for stable RUL gg nodule, lower lobe bronchiolitis R>L, secretions in RMB, RLL. Emphysema.  CXR 03/09/23 unchanged  Pulmonary Functions Testing Results:    Latest Ref Rng & Units 07/18/2021   11:54 AM  PFT Results  FVC-Pre L 3.79   FVC-Predicted Pre % 89   FVC-Post L 4.04   FVC-Predicted Post % 95   Pre FEV1/FVC % % 62   Post FEV1/FCV % % 62   FEV1-Pre L 2.34   FEV1-Predicted Pre % 77   FEV1-Post L 2.50   DLCO uncorrected ml/min/mmHg 19.80   DLCO UNC% % 77   DLCO corrected ml/min/mmHg 19.80   DLCO COR %Predicted % 77   DLVA Predicted % 89   TLC L 7.18   TLC % Predicted % 97   RV % Predicted % 123    07/18/21 PFT Reviewed by me ratio 62 mild obstruction, lung volumes with air trapping, very mildly reduced diffusing capacitiy  Reviewed [facility] PFTs: 01/2020 FEV1/FVC 54, post BD FEV1 82.8% without BD response.     Echocardiogram:   05/03/20 JYN:WGNFA PCE, age appropriate diastolic parameters     Assessment & Plan:   Chronic cough: has had trials of ppi/H2B. Suspect multifactorial with COPD, UACS with persistent postnasal drip, possible component of LPR (despite negative trial of PPI) but would  have expected more edematous appearing vocal cords/folds on laryngoscopy, mild bronchiectasis. RLL changes that appear consistent with recurrent aspiration and MEP low which together with his MG raises question of weak/ineffective cough. Has responded to antibiotics in past. Overall, better than baseline.  Continue Wixela.. Per Dr. Lydia Sams (neurology) would prefer to avoid LAMA if possible with MG.  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  Lung exam improved.  Improving with increased airway clearance via flutter valve. 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 Wixela as above, avoiding LAMA as above.    MG: MEP was just below LLN. TLC, FVC WNL 02/04/20. Periodic surveillance spiro.  RUL GG pulmonary nodule: stable since 2015. No further need for surveillance.   RTC 6 months with Dr. Drury Geralds, MD [facility] 03/26/2024 9:50 AM

## 2024-03-27 ENCOUNTER — Ambulatory Visit: Attending: Cardiology

## 2024-03-27 DIAGNOSIS — Z7901 Long term (current) use of anticoagulants: Secondary | ICD-10-CM | POA: Insufficient documentation

## 2024-03-27 DIAGNOSIS — Z5181 Encounter for therapeutic drug level monitoring: Secondary | ICD-10-CM | POA: Insufficient documentation

## 2024-03-27 DIAGNOSIS — I4891 Unspecified atrial fibrillation: Secondary | ICD-10-CM | POA: Insufficient documentation

## 2024-03-27 LAB — POCT INR: INR: 2.6 (ref 2.0–3.0)

## 2024-03-27 NOTE — Patient Instructions (Addendum)
 Continue taking warfarin 1 tablet daily except 1.5 tablets tablets on Thursdays.  Recheck INR in 6 weeks.  Call the Coumadin  Clinic with any upcoming procedures or new medications.  Coumadin  Clinic [phone]

## 2024-03-31 ENCOUNTER — Other Ambulatory Visit: Payer: Self-pay | Admitting: Family Medicine

## 2024-03-31 ENCOUNTER — Ambulatory Visit (INDEPENDENT_AMBULATORY_CARE_PROVIDER_SITE_OTHER): Admitting: Podiatry

## 2024-03-31 ENCOUNTER — Encounter: Payer: Self-pay | Admitting: Podiatry

## 2024-03-31 DIAGNOSIS — M48061 Spinal stenosis, lumbar region without neurogenic claudication: Secondary | ICD-10-CM

## 2024-03-31 DIAGNOSIS — M25572 Pain in left ankle and joints of left foot: Secondary | ICD-10-CM | POA: Diagnosis not present

## 2024-03-31 MED ORDER — TRIAMCINOLONE ACETONIDE 10 MG/ML IJ SUSP
10.0000 mg | Freq: Once | INTRAMUSCULAR | Status: AC
  Administered 2024-03-31: 10 mg via INTRA_ARTICULAR

## 2024-03-31 NOTE — Progress Notes (Signed)
 Presents with complaint of pain in the feet bilaterally most from the dorsal aspect of the foot and into the metatarsal heads.  This is especially painful left show it indicates over the second tarsal metatarsal phalangeal joint.  Also complains of edema and swelling in the feet and legs.  Noticed any redness or ecchymosis.  Has a prednisone  12-day taper of prednisone  did not help  PE:  General appearance: Pleasant, and in no acute distress. AOx3.  Vascular: Pedal pulses: DP 2/4, PT 2/4.  Moderate edema lower legs bilaterally. Capillary fill time immediate.  Neurological: Light touch intact feet bilaterally.  Normal Achilles reflex bilaterally.  No clonus or spasticity noted.  No complaint of paresthesias or burning.  Negative Tinel's sign tarsal tunnel porta pedis and a deep peroneal nerve left  Dermatologic:   Skin normal temperature bilaterally.  Skin normal color, tone, and texture bilaterally.   Musculoskeletal: Tenderness to palpation and range of motion of second tarsal metatarsal joint left foot.  Some of the metatarsal heads with atrophy of soft tissue.  Radiographs: None  Diagnosis: Arthralgia second tarsal metatarsal joint left.  Plan: -Discussed with him the joint pain and pain in the foot.  Recommend he wear some kind of padding under the metatarsals.  He says  his insurance will not cover them.  Also again recommended compression hose BK 20 to 30 mmHg.  Told him this will control the edema he says he has tried them in the past but the edema came right back.  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 -Injected 3cc 2:1 mixture 0.5 cc Marcaine :Kenolog 10mg /66ml at second tarsometatarsal joint left.  .  Return 2 weeks follow-up injection left

## 2024-04-09 DIAGNOSIS — I4891 Unspecified atrial fibrillation: Secondary | ICD-10-CM | POA: Diagnosis not present

## 2024-04-09 DIAGNOSIS — I493 Ventricular premature depolarization: Secondary | ICD-10-CM | POA: Diagnosis not present

## 2024-04-10 ENCOUNTER — Encounter: Payer: Self-pay | Admitting: Cardiology

## 2024-04-10 DIAGNOSIS — I4891 Unspecified atrial fibrillation: Secondary | ICD-10-CM

## 2024-04-13 MED ORDER — WARFARIN SODIUM 5 MG PO TABS: ORAL_TABLET | ORAL | 0 refills | Status: DC

## 2024-04-13 NOTE — Telephone Encounter (Signed)
 Prescription refill request received for warfarin Lov: 02/14/24 [name])  Next INR check: 05/08/24 Warfarin tablet strength: 5mg   Appropriate dose. Refill sent.

## 2024-04-14 ENCOUNTER — Ambulatory Visit
Admission: RE | Admit: 2024-04-14 | Discharge: 2024-04-14 | Disposition: A | Discharge status: Home / Self Care | Source: Ambulatory Visit | Attending: Family Medicine | Admitting: Family Medicine

## 2024-04-14 DIAGNOSIS — M4722 Other spondylosis with radiculopathy, cervical region: Secondary | ICD-10-CM | POA: Diagnosis not present

## 2024-04-14 DIAGNOSIS — M5126 Other intervertebral disc displacement, lumbar region: Secondary | ICD-10-CM | POA: Diagnosis not present

## 2024-04-14 DIAGNOSIS — M4316 Spondylolisthesis, lumbar region: Secondary | ICD-10-CM | POA: Diagnosis not present

## 2024-04-14 DIAGNOSIS — M48061 Spinal stenosis, lumbar region without neurogenic claudication: Secondary | ICD-10-CM

## 2024-04-16 ENCOUNTER — Encounter: Payer: Self-pay | Admitting: Emergency Medicine

## 2024-04-16 ENCOUNTER — Ambulatory Visit: Attending: Emergency Medicine | Admitting: Emergency Medicine

## 2024-04-16 ENCOUNTER — Ambulatory Visit: Admitting: Cardiology

## 2024-04-16 VITALS — BP 90/60 | HR 81 | Ht 71.0 in | Wt 207.0 lb

## 2024-04-16 DIAGNOSIS — R600 Localized edema: Secondary | ICD-10-CM | POA: Insufficient documentation

## 2024-04-16 DIAGNOSIS — E785 Hyperlipidemia, unspecified: Secondary | ICD-10-CM

## 2024-04-16 DIAGNOSIS — I471 Supraventricular tachycardia, unspecified: Secondary | ICD-10-CM | POA: Diagnosis not present

## 2024-04-16 DIAGNOSIS — I480 Paroxysmal atrial fibrillation: Secondary | ICD-10-CM | POA: Insufficient documentation

## 2024-04-16 DIAGNOSIS — I2510 Atherosclerotic heart disease of native coronary artery without angina pectoris: Secondary | ICD-10-CM | POA: Insufficient documentation

## 2024-04-16 DIAGNOSIS — I491 Atrial premature depolarization: Secondary | ICD-10-CM

## 2024-04-16 NOTE — Patient Instructions (Signed)
 Medication Instructions:  NO CHANGES   Lab Work: NONE   Testing/Procedures: NONE  Follow-Up: At [facility], you and your health needs are our priority.  As part of our continuing mission to provide you with exceptional heart care, our providers are all part of one team.  This team includes your primary Cardiologist (physician) and Advanced Practice Providers or APPs (Physician Assistants and Nurse Practitioners) who all work together to provide you with the care you need, when you need it.  Your next appointment:   6 month(s)  Provider:   Dorothye Gathers, MD     Other Instructions A REFERRAL TO EP HAS BEEN PUT IN TO SEE DR. Carolynne Citron.

## 2024-04-16 NOTE — Progress Notes (Unsigned)
 Cardiology Office Note:    Date:  04/16/2024  ID:  [name], DOB [birth year], MRN [id number] PCP: Benedetto Brady, MD  [facility] Providers Cardiologist:  Dorothye Gathers, MD { Click to update primary MD,subspecialty MD or APP then REFRESH:1}    {Click to Open Review  :1}   Patient Profile:       Chief Complaint: 1 month follow-up for leg swelling and lightheadedness History of Present Illness:  [name] is a 83 y.o. male with visit-pertinent history of persistent atrial fibrillation, prior pericarditis, myasthenia gravis, COPD, hyperlipidemia, PSVT s/p ablation in 2012   Diagnosed with atrial fibrillation incidentally on 05/10/2020.  Unfortunately he was unable to tolerate Eliquis  or Xarelto , currently on Coumadin .  He was diagnosed with acute pericarditis in July 2021 while in [place].  He had subtle ST segment changes noted on his EKG.  CRP was negative.  He did have a small pericardial effusion.  Did not take colchicine because of side effects however completed course of ibuprofen .   He established with cardiology service on 10/14/2018 with Dr. Renna Cary for preoperative cardiovascular risk assessment.  In 2012 he was seen by Dr. Carolynne Citron with EP and underwent catheter ablation of PSVT.   He was seen in office on 03/08/2023 by Dr. Renna Cary.  He was currently residing in sinus rhythm.  His atorvastatin  was increased from 10 mg to 20 mg.  Overall he was doing well from a cardiac standpoint.  He was to follow-up in 1 year.  He was last seen in office on 02/14/2024.  He presented with recurrent episodes of lightheadedness and fatigue.  He reported that he gets tired easily even after short walks.  He is also experiencing increased leg swelling over the last 2 to 3 months.  His EKG showed NSR with PVCs.  Echocardiogram was ordered and completed on 02/27/2024 showing LVEF 55 to 60%, no RWMA, mild LVH, RV function and size normal, normal PASP, mild mitral valve regurgitation.  ZIO  monitor was worn and completed on 02/14/2024 showing 187 supraventricular tachycardia runs with the run with the fastest interval lasting 28.6 seconds with a max rate of 174 bpm and longest lasting 56.9 seconds with an average rate of 144 bpm.  There was a 4% PAC burden and 1.4% PVC burden.   Discussed the use of AI scribe software for clinical note transcription with the patient, who gave verbal consent to proceed.  History of Present Illness CLIFORD [name] is an 83 year old male who follows up today for lightheadedness and leg swelling.  He is accompanied by his wife today.  Leg swelling has now been intermittent for several months.  The swelling typically occurs in the feet and ankles and varies in intensity. Lasix provides temporary relief when used for three days.  He will take his Lasix only as needed for about 3 days in a row which helps resolve his lower extremity swelling.  Today he denies any further lightheadedness or dizziness.  He is without any syncope or presyncope.  He felt as if his lightheadedness in the past was likely due to allergies and dehydration.  He further denies any palpitations or tachycardia.  He frequently uses a salt shaker and does not watch his sodium intake, which may contribute to symptoms. Previously active, he now finds walking short distances challenging. No current fatigue, chest pain, dyspnea, orthopnea, PND.   Review of systems:  Please see the history of present illness. All other systems are reviewed  and otherwise negative.      Studies Reviewed:    EKG Interpretation Date/Time:  Thursday April 16 2024 11:30:12 EDT Ventricular Rate:  81 PR Interval:  242 QRS Duration:  98 QT Interval:  374 QTC Calculation: 434 R Axis:   -29  Text Interpretation: Sinus rhythm with 1st degree A-V block with Premature supraventricular complexes Incomplete right bundle branch block Minimal voltage criteria for LVH, may be normal variant ( R in aVL ) ST & T wave  abnormality, consider lateral ischemia Confirmed by Palmer Bobo [phone]) on 04/16/2024 12:04:52 PM    ZIO 02/14/2024   No episodes of atrial fibrillation detected   Sinus rhtyhm with frequent PAC's and occasional PVC's   Runs of atrial tachycardia were noted - benign   Baseline wander artifact noted     Patch Wear Time:  8 days and 15 hours (2025-05-23T17:44:43-0400 to 2025-06-01T09:12:01-399)   Patient had a min HR of 27 bpm, max HR of 174 bpm, and avg HR of 66 bpm. Predominant underlying rhythm was Sinus Rhythm. First Degree AV Block was present. 187 Supraventricular Tachycardia runs occurred, the run with the fastest interval lasting 28.6  secs with a max rate of 174 bpm, the longest lasting 56.9 secs with an avg rate of 144 bpm. Second Degree AV Block-Mobitz I (Wenckebach) was present. Isolated SVEs were occasional (4.0%, 31970), SVE Couplets were rare (<1.0%, 1648), and SVE Triplets were  rare (<1.0%, 407). Isolated VEs were occasional (1.4%, 10899), VE Couplets were rare (<1.0%, 75), and VE Triplets were rare (<1.0%, 5). Ventricular Bigeminy and Trigeminy were present. Difficulty discerning atrial activity making definitive diagnosis  difficult to ascertain.  Echocardiogram 02/27/2024  1. Left ventricular ejection fraction, by estimation, is 55 to 60%. The  left ventricle has normal function. The left ventricle has no regional  wall motion abnormalities. There is mild left ventricular hypertrophy.  Left ventricular diastolic parameters  are indeterminate.   2. Right ventricular systolic function is normal. The right ventricular  size is normal. There is normal pulmonary artery systolic pressure.   3. The mitral valve is normal in structure. Mild mitral valve  regurgitation. No evidence of mitral stenosis.   4. The aortic valve is tricuspid. Aortic valve regurgitation is trivial.  No aortic stenosis is present.   5. The inferior vena cava is normal in size with greater than 50%   respiratory variability, suggesting right atrial pressure of 3 mmHg.   6. Ascending aorta measurements are within normal limits for age when  indexed to body surface area.   Risk Assessment/Calculations:    CHA2DS2-VASc Score = 4  {Confirm score is correct.  If not, click here to update score.  REFRESH note.  :1} This indicates a 4.8% annual risk of stroke. The patient's score is based upon: CHF History: 0 HTN History: 1 Diabetes History: 0 Stroke History: 0 Vascular Disease History: 1 Age Score: 2 Gender Score: 0   {This patient has a significant risk of stroke if diagnosed with atrial fibrillation.  Please consider VKA or DOAC agent for anticoagulation if the bleeding risk is acceptable.   You can also use the SmartPhrase .[email or web address] for documentation.   :[id number]}          Physical Exam:   VS:  BP 90/60 (BP Location: Left Arm)   Pulse 81   Ht 5' 11 (1.803 m)   Wt 207 lb (93.9 kg)   SpO2 97%   BMI 28.87 kg/m    Wt Readings from  Last 3 Encounters:  04/16/24 207 lb (93.9 kg)  03/26/24 209 lb 3.2 oz (94.9 kg)  03/25/24 210 lb (95.3 kg)    GEN: Well nourished, well developed in no acute distress NECK: No JVD; No carotid bruits CARDIAC: RRR, no murmurs, rubs, gallops RESPIRATORY:  Clear to auscultation without rales, wheezing or rhonchi  ABDOMEN: Soft, non-tender, non-distended EXTREMITIES: +1 bilateral lower extremity edema; No acute deformity      Assessment and Plan:  Paroxysmal atrial fibrillation Diagnosed 05/10/2020 incidentally Denies symptoms suggestive of recurrent arrhythmia Unable to tolerate Eliquis  or Xarelto , currently on Coumadin  - Continue metoprolol  XL 50 mg daily - Continue Coumadin  per Coumadin  clinic    PAC/PVC ZIO 04/09/2024 showed 4% PAC burden and 1.4% PVC burden - He remains asymptomatic today.  Well-controlled - Continue metoprolol  Excel 50 mg daily   H/o pericarditis Diagnosed July 2021.  Did have subtle ST segment changes, CRP  negative, did have small pericardial effusion, did not take colchicine, completed course of ibuprofen  Follow-up echocardiogram 10/2020 showed normal LV function without pericardial effusion - Remains stable without complaints of angina   PSVT S/p ablation in 2012 by Dr. Ardell Beauvais 03/2024 showed 187 SVT runs occurred, the run with the fastest interval lasting 28.6 seconds with max rate of 174 bpm, the longest lasting 56.9 seconds with average rate of 144 bpm -He continues to have frequent episodes of SVT.  However he is entirely asymptomatic.  Did note some lightheadedness and fatigue last month and may have been contributive however this is resolved.  Will refer back to Dr. Carolynne Citron for further recommendations.  Unable to further titrate metoprolol  given low normal BP. - Referral back to Dr. Carolynne Citron   Coronary artery calcification / Hyperlipidemia LDL 66, HDL 53, TC 138, TG 110 on 0/9811 Coronary artery calcification noted on CT scan for lung cancer screening - LDL currently under excellent control and under goal less than 70 - Today he denies any anginal symptoms, no indication for further ischemic evaluation at this time - Continue atorvastatin  40 mg daily   Leg swelling Echocardiogram 02/2024 with LVEF 55 to 60%, no RWMA, mild LVH, indeterminate diastolic parameters - Has intermittent episodes of lower extremity swelling.  He is currently taking Lasix as needed.  Tells me and he notices swelling he will take his Lasix for 3 days in a row which typically resolves all the swelling.  On PE today he had 1+ bilateral pedal edema - There is a possibility daily prednisone  for his myasthenia gravis could be exacerbating his pedal edema  - His diet is high in salt and he is unable to exercise much given his myasthenia gravis - This is likely combination of diet, deconditioning, diastolic dysfunction, and venous insufficiency.  I encouraged him to lower sodium in his diet, elevate feet, walk frequently,  and lower extremity stockings. - Will change Lasix from as needed to 20 mg daily  Myasthenia gravis - Stable on prednisone , managed by Dr. Lydia Sams       Dispo:  Return in about 6 months (around 10/16/2024).  Signed, Ava Boatman, NP

## 2024-04-20 ENCOUNTER — Encounter: Payer: Self-pay | Admitting: Emergency Medicine

## 2024-04-20 ENCOUNTER — Ambulatory Visit: Payer: Self-pay | Admitting: Emergency Medicine

## 2024-04-28 ENCOUNTER — Encounter: Payer: Self-pay | Admitting: Podiatry

## 2024-04-28 ENCOUNTER — Ambulatory Visit (INDEPENDENT_AMBULATORY_CARE_PROVIDER_SITE_OTHER): Admitting: Podiatry

## 2024-04-28 DIAGNOSIS — I872 Venous insufficiency (chronic) (peripheral): Secondary | ICD-10-CM | POA: Diagnosis not present

## 2024-04-28 DIAGNOSIS — M7751 Other enthesopathy of right foot: Secondary | ICD-10-CM | POA: Diagnosis not present

## 2024-04-28 DIAGNOSIS — I890 Lymphedema, not elsewhere classified: Secondary | ICD-10-CM | POA: Diagnosis not present

## 2024-04-28 DIAGNOSIS — M7752 Other enthesopathy of left foot: Secondary | ICD-10-CM | POA: Diagnosis not present

## 2024-04-28 NOTE — Progress Notes (Unsigned)
 If present follow-up injection second tarsometatarsal joint.  Doing much better no pain in the area.  Does have pain on the lateral plantar aspect of the foot at the base of the fifth metatarsal.  Says this bothers him intermittently.  Generally he is on his feet more which starts bothering him.  Has not noticed any redness or swelling.  Also complains of swelling in the lower legs normally.   Physical exam:  General appearance: Pleasant, and in no acute distress. AOx3.  Vascular: Pedal pulses: DP 2/4 bilaterally, PT 2/4 bilaterally.  Moderate edema lower legs bilaterally. Capillary fill time immediate.  Neurological: Light touch intact feet bilaterally.  Normal Achilles reflex bilaterally.    Dermatologic:   Skin thin and atrophic with no hair growth lower extremity.  Areas of hyperpigmentation.  Musculoskeletal: Tenderness plantar lateral aspect fifth metatarsal base.  Prominence of fifth metatarsal base due to the met adductus bilaterally.  No tenderness along the peroneal tendons.  No tenderness at the fourth fifth met cuboid joints bilaterally.    Diagnosis: 1.  Bursitis feet bilaterally. 2.  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  Plan: -Established office visit for evaluation and management.  Level 3. - Discussed with him and recommended compression hose recommended BKA 20 to 30 mm removed 3.  Open or close toed.  Discussed exercises he can do to minimize lymphedema. -Discussed the pain at the base of the fifth metatarsal on the bursitis from the prominence of the bone.  Recommended Voltaren gel as needed.  Icing as needed. -Stand good comfortable supportive and well cushioned shoes.  Return as needed

## 2024-05-08 ENCOUNTER — Ambulatory Visit: Attending: Cardiology

## 2024-05-08 DIAGNOSIS — I4891 Unspecified atrial fibrillation: Secondary | ICD-10-CM | POA: Diagnosis not present

## 2024-05-08 DIAGNOSIS — Z5181 Encounter for therapeutic drug level monitoring: Secondary | ICD-10-CM | POA: Insufficient documentation

## 2024-05-08 LAB — POCT INR: INR: 3 (ref 2.0–3.0)

## 2024-05-08 NOTE — Progress Notes (Signed)
Please see anticoagulation encounter.

## 2024-05-08 NOTE — Patient Instructions (Signed)
 Description   Continue taking warfarin 1 tablet daily except 1.5 tablets tablets on Thursdays.  Recheck INR in 6 weeks.  Call the Coumadin Clinic with any upcoming procedures or new medications.  [facility] [phone]

## 2024-05-26 ENCOUNTER — Ambulatory Visit: Attending: Internal Medicine | Admitting: Internal Medicine

## 2024-05-26 ENCOUNTER — Encounter: Payer: Self-pay | Admitting: Internal Medicine

## 2024-05-26 VITALS — BP 114/58 | HR 77 | Ht 72.0 in | Wt 208.7 lb

## 2024-05-26 DIAGNOSIS — I480 Paroxysmal atrial fibrillation: Secondary | ICD-10-CM | POA: Insufficient documentation

## 2024-05-26 MED ORDER — FLECAINIDE ACETATE 50 MG PO TABS: 50.0000 mg | ORAL_TABLET | Freq: Two times a day (BID) | ORAL | 3 refills | Status: AC

## 2024-05-26 NOTE — Progress Notes (Signed)
 HPI [name] today for followup. 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 Today he complains that he has had some dizzy/weak spells. He wore a cardiac monitor which showed brief episodes of atrial fib, not SVT as was read.he is on anti-coag. It stops within a few deep breaths and has not returned. He has had no daytime symptoms. He denies chest pain or shortness of breath. No syncope.  Allergies  Allergen Reactions   Ambrosia Trifida (Tall Ragweed) Allergy  Skin Test Anaphylaxis   Bee Pollen Itching    Nasal congestion   Beta Adrenergic Blockers     Other reaction(s): MG   Ciprofloxacin      Other reaction(s): myasthenia gravis   Eliquis  [Apixaban ]     Throat tightening   Erythromycin     Other reaction(s): MG   Pollen Extract Other (See Comments)    Nasal congestion     Current Outpatient Medications  Medication Sig Dispense Refill   albuterol  (VENTOLIN  HFA) 108 (90 Base) MCG/ACT inhaler Inhale 1-2 puffs into the lungs every 6 (six) hours as needed for wheezing or shortness of breath. 18 g 5   atorvastatin  (LIPITOR) 40 MG tablet TAKE 1 TABLET BY MOUTH EVERY DAY 90 tablet 3   Calcium  Carb-Cholecalciferol (CALCIUM  500 + D) 500-5 MG-MCG TABS Take by mouth daily at 6 (six) AM.     Cyanocobalamin  (VITAMIN B-12 PO) Take 5,000 mcg by mouth daily.      doxazosin  (CARDURA ) 8 MG tablet Take 8 mg by mouth daily.     finasteride  (PROSCAR ) 5 MG tablet 5 mg daily.     fluticasone  (FLONASE ) 50 MCG/ACT nasal spray Place 2 sprays into both nostrils daily.     furosemide (LASIX) 20 MG tablet Take 20 mg by mouth daily.     glucosamine-chondroitin 500-400 MG tablet Take 1 tablet by mouth daily.     ipratropium (ATROVENT ) 0.03 % nasal spray PLACE 1 SPRAY INTO BOTH NOSTRILS DAILY AS NEEDED FOR RHINITIS. 90 mL 1   metoprolol  succinate (TOPROL -XL) 50 MG 24 hr tablet Take 1 tablet (50 mg total) by mouth daily. 90 tablet 3    montelukast  (SINGULAIR ) 10 MG tablet Take 10 mg by mouth daily.     Multiple Vitamin (MULTIVITAMIN WITH MINERALS) TABS tablet Take 1 tablet by mouth daily.     Potassium Chloride  ER 20 MEQ TBCR Take 1 tablet by mouth daily.     predniSONE  (DELTASONE ) 5 MG tablet Take 1 tablet (5 mg total) by mouth daily with breakfast. 90 tablet 3   pyridostigmine  (MESTINON ) 60 MG tablet TAKE 0.5 TABLETS (30 MG TOTAL) BY MOUTH 3 (THREE) TIMES DAILY. TAKE HALF TABLET AT 8AM, 1PM, AND 6PM 135 tablet 3   warfarin (COUMADIN ) 5 MG tablet TAKE 1 TABLET TO 1 1/2 TABLETS BY MOUTH DAILY AS DIRECTED BY COUMADIN  CLINIC 100 tablet 0   WIXELA INHUB 250-50 MCG/ACT AEPB Inhale 1 puff into the lungs 2 (two) times daily.     Current Facility-Administered Medications  Medication Dose Route Frequency Provider Last Rate Last Admin   triamcinolone  acetonide (KENALOG ) 10 MG/ML injection 10 mg  10 mg Intramuscular Once          Past Medical History:  Diagnosis Date   Abscess of sigmoid colon due to diverticulitis 12/26/2018   Appendicitis 09/05/2014   Basal cell carcinoma 10/04/2015   left deltoid-[id number]   BCC (basal cell carcinoma)  11/03/2019   left shin (txpbx)   Colon polyp    Complication of anesthesia    Difficult to arouse, Combative x 1   COVID    Diverticulitis    Diverticulosis    extensive 3'15.   Dysrhythmia    hx. 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   Past Surgical History:  Procedure Laterality Date   CARDIAC ELECTROPHYSIOLOGY MAPPING AND ABLATION  2010   cardiac    COLONOSCOPY  11/2018   Dr Dianna.  Many polyps.  rec f/u colonoscopy summer 2020   EUS N/A 11/17/2014   Procedure: ESOPHAGEAL ENDOSCOPIC ULTRASOUND (EUS) RADIAL;  Surgeon: Fallow Cree, MD;  Location: [facility];  Service: Endoscopy;  Laterality: N/A;   FINE NEEDLE ASPIRATION N/A 11/17/2014   Procedure: FINE NEEDLE ASPIRATION (FNA) LINEAR;  Surgeon: Fallow Cree, MD;  Location: [facility];  Service: Endoscopy;  Laterality: N/A;  + or- fna   FLEXIBLE SIGMOIDOSCOPY     HEMORRHOID SURGERY     banding    INGUINAL HERNIA REPAIR Bilateral 2005   '05 bilateral hernia   KNEE ARTHROSCOPY Left 2008   scope '08   LAPAROSCOPIC APPENDECTOMY N/A 09/05/2014   Procedure: APPENDECTOMY LAPAROSCOPIC;  Surgeon: Vicenta Poli, MD;  Location: [facility] OR;  Service: General;  Laterality: N/A;   PROCTOSCOPY N/A 12/26/2018   Procedure: RIGID PROCTOSCOPY;  Surgeon: Sheldon Standing, MD;  Location: [facility];  Service: General;  Laterality: N/A;   ROTATOR CUFF REPAIR Right 2016     Family History  Problem Relation Age of Onset   Pancreatic cancer Father    Stroke Mother    Aneurysm Brother    Allergies Brother    Neuropathy Neg Hx      Social History   Socioeconomic History   Marital status: Married    Spouse name: Not on file   Number of children: 3   Years of education: 14   Highest education level: Some college, no degree  Occupational History   Occupation: sales-Retired, works one day a week  Tobacco Use   Smoking status: Former    Current packs/day: 0.00    Average packs/day: 1 pack/day for 47.0 years (47.0 ttl pk-yrs)    Types: Cigarettes    Start date: 10/29/1953    Quit date: 10/29/2000    Years since quitting: 23.5   Smokeless tobacco: Never  Vaping Use   Vaping status: Never Used   Substance and Sexual Activity   Alcohol use: Not Currently    Alcohol/week: 1.0 standard drink of alcohol    Types: 1 Standard drinks or equivalent per week    Comment: socially- nightly 1 cocktail   Drug use: No   Sexual activity: Not  on file  Other Topics Concern   Not on file  Social History Narrative   Lives with wife in a one story home with a basement.  Has 3 daughters.  Works one day a week at the [employer].  Retired from Airline pilot.  Education: some college.    Caffeine use: decaf   Right handed   One story home   Social Drivers of Health   Financial Resource Strain: Low Risk  (05/04/2020)   Received from [facility]   Overall Financial Resource Strain (CARDIA)    Difficulty of Paying Living Expenses: Not hard at all  Food Insecurity: No Food Insecurity (05/04/2020)   Received from [facility]   Hunger Vital Sign    Within the past 12 months, you worried that your food would run out before you got the money to buy more.: Never true    Within the past 12 months, the food you bought just didn't last and you didn't have money to get more.: Never true  Transportation Needs: No Transportation Needs (05/04/2020)   Received from [facility]   PRAPARE - Transportation    Lack of Transportation (Medical): No    Lack of Transportation (Non-Medical): No  Physical Activity: Not on file  Stress: Not on file  Social Connections: Moderately Isolated (05/04/2020)   Received [facility]   Social Connection and Isolation Panel    In a typical week, how many times do you talk on the phone with family, friends, or neighbors?: More than three times a week    How often do you get together with friends or relatives?: Twice a week    How often do you attend church or religious services?: Never    Do you belong to any clubs or organizations such as church groups, unions, fraternal or athletic groups, or school groups?: No    How often do you  attend meetings of the clubs or organizations you belong to?: Never    Are you married, widowed, divorced, separated, never married, or living with a partner?: Married  Intimate Partner Violence: Not on file     BP (!) 114/58   Pulse 77   Ht 6' (1.829 m)   Wt 208 lb 11.2 oz (94.7 kg)   SpO2 92%   BMI 28.30 kg/m   Physical Exam:  Well appearing NAD HEENT: Unremarkable Neck:  No JVD, no thyromegally Lymphatics:  No adenopathy Back:  No CVA tenderness Lungs:  Clear with no wheezes HEART:  Regular rate rhythm, no murmurs, no rubs, no clicks Abd:  soft, positive bowel sounds, no organomegally, no rebound, no guarding Ext:  2 plus pulses, no edema, no cyanosis, no clubbing Skin:  No rashes no nodules Neuro:  CN II through XII intact, motor grossly intact   Assess/Plan: PAF - he will continue warfarin. Did not like the price of OAC. I recommended a low dose of flecainide . Additional adjustments in his meds will depend on how he does with 50 mg bid. 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 His heart monitor shows atrial fib.   Danelle Tait Balistreri,MD

## 2024-05-26 NOTE — Patient Instructions (Signed)
 Medication Instructions:  Your physician has recommended you make the following change in your medication:  Start Flecainide  50 mg twice daily.  Lab Work: None ordered.  You may go to any [facility] Location for your lab work:  [facility] - [address] ([facility]) - [address] [phone] [address]  [place] - [address]   [place]  - [address]   [place] - [address] - [address] [facility]  - [address] [place] [address] ([facility]   If you have labs (blood work) drawn today and your tests are completely normal, you will receive your results only by: [employer] (if you have MyChart)  If you have any lab test that is abnormal or we need to change your treatment, we will call you or send a MyChart message to review the results.  Testing/Procedures: None ordered.  Follow-Up: At [facility], you and your health needs are our priority.  As part of our continuing mission to provide you with exceptional heart care, we have created designated Provider Care Teams.  These Care Teams include your primary Cardiologist (physician) and Advanced Practice Providers (APPs -  Physician Assistants and Nurse Practitioners) who all work together to provide you with the care you need, when you need it.   Your next appointment:   EKG nurse visit in 3 weeks  The format for your next appointment:   In Person  Provider:   Danelle Birmingham, MD or one of the following Advanced Practice Providers on your designated Care Team:   Charlies Arthur, [place] Ozell Jodie Passey, [place] Leotis Barrack, NP  Note: Remote monitoring is used to monitor your Pacemaker/ ICD from home. This monitoring reduces the number of office visits required to check your device to one time per year. It allows us  to keep an eye on the functioning of your device to ensure it is working  properly.

## 2024-06-19 ENCOUNTER — Ambulatory Visit: Attending: Cardiology

## 2024-06-19 ENCOUNTER — Ambulatory Visit (INDEPENDENT_AMBULATORY_CARE_PROVIDER_SITE_OTHER)

## 2024-06-19 VITALS — BP 122/64 | HR 68 | Ht 72.0 in

## 2024-06-19 DIAGNOSIS — I4891 Unspecified atrial fibrillation: Secondary | ICD-10-CM | POA: Diagnosis not present

## 2024-06-19 DIAGNOSIS — Z5181 Encounter for therapeutic drug level monitoring: Secondary | ICD-10-CM | POA: Insufficient documentation

## 2024-06-19 DIAGNOSIS — Z7901 Long term (current) use of anticoagulants: Secondary | ICD-10-CM | POA: Diagnosis not present

## 2024-06-19 LAB — POCT INR: INR: 1.8 — AB (ref 2.0–3.0)

## 2024-06-19 NOTE — Progress Notes (Signed)
 Patient here for an EKG per Dr. Waddell. Patient started Flecainide  per last office visit note on 05/26/2024. Patient denied feeling symptoms or noted any changes after starting Flecainide .  DOD Dr. Mona. Signed EKG.  Josie RN

## 2024-06-19 NOTE — Addendum Note (Signed)
 Addended by: ANDREZ PRAIRIE on: 06/19/2024 04:26 PM   Modules accepted: Level of Service

## 2024-06-19 NOTE — Patient Instructions (Signed)
 Increase to 1 tablet daily except 1.5 tablets tablets on Tuesdays and Thursdays.  Recheck INR in 4 weeks. Ensure started recently 1 per day. Call the Coumadin  Clinic with any upcoming procedures or new medications.  Coumadin  Clinic [phone]

## 2024-06-19 NOTE — Progress Notes (Signed)
 INR 1.8 Please see anticoagulation encounter   Increase to 1 tablet daily except 1.5 tablets tablets on Tuesdays and Thursdays.  Recheck INR in 4 weeks. Ensure started recently 1 per day. Call the Coumadin  Clinic with any upcoming procedures or new medications.  Coumadin  Clinic [phone]

## 2024-06-19 NOTE — Patient Instructions (Signed)
 Medication Instructions:  Your physician recommends that you continue on your current medications as directed. Please refer to the Current Medication list given to you today.  *If you need a refill on your cardiac medications before your next appointment, please call your pharmacy*  Lab Work: N/A If you have labs (blood work) drawn today and your tests are completely normal, you will receive your results only by: MyChart Message (if you have MyChart) OR A paper copy in the mail If you have any lab test that is abnormal or we need to change your treatment, we will call you to review the results.  Testing/Procedures: N/A  Patient seen for follow up EKG after starting Flecainide 

## 2024-06-26 ENCOUNTER — Ambulatory Visit: Admitting: Podiatry

## 2024-06-27 ENCOUNTER — Other Ambulatory Visit: Payer: Self-pay | Admitting: Cardiology

## 2024-06-27 DIAGNOSIS — I4891 Unspecified atrial fibrillation: Secondary | ICD-10-CM

## 2024-07-07 DIAGNOSIS — L814 Other melanin hyperpigmentation: Secondary | ICD-10-CM | POA: Diagnosis not present

## 2024-07-07 DIAGNOSIS — L570 Actinic keratosis: Secondary | ICD-10-CM | POA: Diagnosis not present

## 2024-07-07 DIAGNOSIS — D225 Melanocytic nevi of trunk: Secondary | ICD-10-CM | POA: Diagnosis not present

## 2024-07-07 DIAGNOSIS — L578 Other skin changes due to chronic exposure to nonionizing radiation: Secondary | ICD-10-CM | POA: Diagnosis not present

## 2024-07-07 DIAGNOSIS — L821 Other seborrheic keratosis: Secondary | ICD-10-CM | POA: Diagnosis not present

## 2024-07-09 ENCOUNTER — Encounter: Payer: Self-pay | Admitting: Podiatry

## 2024-07-09 ENCOUNTER — Ambulatory Visit (INDEPENDENT_AMBULATORY_CARE_PROVIDER_SITE_OTHER): Admitting: Podiatry

## 2024-07-09 VITALS — Ht 72.0 in | Wt 200.0 lb

## 2024-07-09 DIAGNOSIS — I890 Lymphedema, not elsewhere classified: Secondary | ICD-10-CM

## 2024-07-09 DIAGNOSIS — I872 Venous insufficiency (chronic) (peripheral): Secondary | ICD-10-CM | POA: Diagnosis not present

## 2024-07-09 NOTE — Progress Notes (Signed)
 Chief Complaint  Patient presents with   Foot Pain    Bilateral foot pain and swelling lateral sides are worse.  Pain 8 when walking barefoot.  Ongoing 1 year.  Non diabetic Warfarin    HPI: 83 y.o. male presents today for ongoing leg swelling.  Reports pain to the foot and ankle is diffusely.  He has tried compression stockings some but states that these can be irritating.  Does report some pain to the bony prominences on the outside of his feet mostly worsened when walking barefoot.  Past Medical History:  Diagnosis Date   Abscess of sigmoid colon due to diverticulitis 12/26/2018   Appendicitis 09/05/2014   Basal cell carcinoma 10/04/2015   left [id number]   BCC (basal cell carcinoma) 11/03/2019   left shin (txpbx)   Colon polyp    Complication of anesthesia    Difficult to arouse, Combative x 1   COVID    Diverticulitis    Diverticulosis    extensive 3'15.   Dysrhythmia    hx. 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  Many polyps.  rec f/u colonoscopy summer 2020   EUS N/A 11/17/2014   Procedure: ESOPHAGEAL ENDOSCOPIC ULTRASOUND (EUS) RADIAL;  Surgeon: Fallow Cree, MD;  Location: [facility];  Service: Endoscopy;  Laterality: N/A;   FINE NEEDLE ASPIRATION  N/A 11/17/2014   Procedure: FINE NEEDLE ASPIRATION (FNA) LINEAR;  Surgeon: Fallow Cree, MD;  Location: [facility];  Service: Endoscopy;  Laterality: N/A;  + or- fna   FLEXIBLE SIGMOIDOSCOPY     HEMORRHOID SURGERY     banding    INGUINAL HERNIA REPAIR Bilateral 2005   '05 bilateral hernia   KNEE ARTHROSCOPY Left 2008   scope '08   LAPAROSCOPIC APPENDECTOMY N/A 09/05/2014   Procedure: APPENDECTOMY LAPAROSCOPIC;  Surgeon: Vicenta Poli, MD;  Location: [facility] OR;  Service: General;  Laterality: N/A;   PROCTOSCOPY N/A 12/26/2018   Procedure: RIGID PROCTOSCOPY;  Surgeon: Sheldon Standing, MD;  Location: [facility];  Service: General;  Laterality: N/A;   ROTATOR CUFF REPAIR Right 2016    Allergies  Allergen Reactions   Ambrosia Trifida (Tall Ragweed) Allergy  Skin Test Anaphylaxis   Bee Pollen Itching    Nasal congestion   Beta Adrenergic Blockers     Other reaction(s): MG   Ciprofloxacin      Other reaction(s): myasthenia gravis   Eliquis  [Apixaban ]     Throat tightening   Erythromycin     Other reaction(s): MG   Pollen Extract Other (See Comments)    Nasal congestion    ROS    Physical Exam:  There were no vitals filed for this visit.  General: The patient is alert and oriented x3 in no acute distress.  Dermatology: Pedal skin atrophic.  No open wounds noted.  No areas of hyperkeratosis.  Vascular: Palpable pedal pulses bilaterally. Capillary refill within normal limits.  Decreased pedal hair growth.  +2 to +3 pitting edema present bilaterally pretibial region.  Neurological: Light touch sensation grossly intact bilateral feet.   Musculoskeletal Exam: Muscle strength 5/5 for all major muscle groups.  No symptomatic limitations in pedal range of motion.  Fat pad atrophy.  Prominent metatarsal heads plantarly and prominent fifth metatarsal bases.  Assessment/Plan of Care: 1. 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  None  Discussed clinical findings with patient today.  Discussed etiology and mechanism of leg swelling from venous insufficiency.  Discussed that unfortunately there is no much else that we can currently offer besides continuing with below-knee compression stockings.  Recommend 20 to 30 mmHg as previously discussed.  He does endorse taking a fluid pill from his PCP.  Discussed importance of keeping legs elevated above chest level.  Also encouraged patient to ambulate for 15 to 20 minutes at least 3-4 times a week.  Did discuss that we could offer for all to vein specialist he would like to defer this.  Regarding the pain associated with the bony prominences, avoid ambulating barefoot on hard surfaces.  Continue use of felt accommodative padding such as metatarsal pads and dancers pads.  Follow-up as needed if symptoms worsen.   Weylin Plagge L. Lamount MAUL, AACFAS [facility]     [address] [place], [place] [address]                Office [phone]  Fax [phone]

## 2024-07-10 DIAGNOSIS — E782 Mixed hyperlipidemia: Secondary | ICD-10-CM | POA: Diagnosis not present

## 2024-07-10 DIAGNOSIS — G609 Hereditary and idiopathic neuropathy, unspecified: Secondary | ICD-10-CM | POA: Diagnosis not present

## 2024-07-10 DIAGNOSIS — R7303 Prediabetes: Secondary | ICD-10-CM | POA: Diagnosis not present

## 2024-07-10 DIAGNOSIS — I2089 Other forms of angina pectoris: Secondary | ICD-10-CM | POA: Diagnosis not present

## 2024-07-14 DIAGNOSIS — J449 Chronic obstructive pulmonary disease, unspecified: Secondary | ICD-10-CM | POA: Diagnosis not present

## 2024-07-14 DIAGNOSIS — E782 Mixed hyperlipidemia: Secondary | ICD-10-CM | POA: Diagnosis not present

## 2024-07-14 DIAGNOSIS — G7000 Myasthenia gravis without (acute) exacerbation: Secondary | ICD-10-CM | POA: Diagnosis not present

## 2024-07-14 DIAGNOSIS — I2089 Other forms of angina pectoris: Secondary | ICD-10-CM | POA: Diagnosis not present

## 2024-07-14 DIAGNOSIS — J309 Allergic rhinitis, unspecified: Secondary | ICD-10-CM | POA: Diagnosis not present

## 2024-07-14 DIAGNOSIS — N400 Enlarged prostate without lower urinary tract symptoms: Secondary | ICD-10-CM | POA: Diagnosis not present

## 2024-07-14 DIAGNOSIS — R7303 Prediabetes: Secondary | ICD-10-CM | POA: Diagnosis not present

## 2024-07-14 DIAGNOSIS — Z23 Encounter for immunization: Secondary | ICD-10-CM | POA: Diagnosis not present

## 2024-07-14 DIAGNOSIS — G609 Hereditary and idiopathic neuropathy, unspecified: Secondary | ICD-10-CM | POA: Diagnosis not present

## 2024-07-14 DIAGNOSIS — R609 Edema, unspecified: Secondary | ICD-10-CM | POA: Diagnosis not present

## 2024-07-14 DIAGNOSIS — R29898 Other symptoms and signs involving the musculoskeletal system: Secondary | ICD-10-CM | POA: Diagnosis not present

## 2024-07-14 DIAGNOSIS — I4891 Unspecified atrial fibrillation: Secondary | ICD-10-CM | POA: Diagnosis not present

## 2024-07-17 ENCOUNTER — Ambulatory Visit

## 2024-07-20 DIAGNOSIS — R3915 Urgency of urination: Secondary | ICD-10-CM | POA: Diagnosis not present

## 2024-07-20 DIAGNOSIS — R350 Frequency of micturition: Secondary | ICD-10-CM | POA: Diagnosis not present

## 2024-07-20 DIAGNOSIS — N401 Enlarged prostate with lower urinary tract symptoms: Secondary | ICD-10-CM | POA: Diagnosis not present

## 2024-07-20 DIAGNOSIS — R351 Nocturia: Secondary | ICD-10-CM | POA: Diagnosis not present

## 2024-07-21 DIAGNOSIS — Z23 Encounter for immunization: Secondary | ICD-10-CM | POA: Diagnosis not present

## 2024-07-24 ENCOUNTER — Ambulatory Visit: Attending: Cardiology | Admitting: *Deleted

## 2024-07-24 DIAGNOSIS — Z5181 Encounter for therapeutic drug level monitoring: Secondary | ICD-10-CM | POA: Insufficient documentation

## 2024-07-24 DIAGNOSIS — I4891 Unspecified atrial fibrillation: Secondary | ICD-10-CM | POA: Insufficient documentation

## 2024-07-24 DIAGNOSIS — Z7901 Long term (current) use of anticoagulants: Secondary | ICD-10-CM | POA: Diagnosis not present

## 2024-07-24 LAB — POCT INR: INR: 1.7 — AB (ref 2.0–3.0)

## 2024-07-24 NOTE — Progress Notes (Signed)
 Description   INR-1.7; Today take 1.5 tablets of warfarin then increase warfarin dose to 1 tablet daily except 1.5 tablets tablets on Sundays, Tuesdays and Thursdays.  Recheck INR in 3 weeks. Protein drink/Ensure started recently 1 per day. Call the Coumadin  Clinic with any upcoming procedures or new medications.  Coumadin  Clinic [phone]

## 2024-07-24 NOTE — Patient Instructions (Signed)
 Description   INR-1.7; Today take 1.5 tablets of warfarin then increase warfarin dose to 1 tablet daily except 1.5 tablets tablets on Sundays, Tuesdays and Thursdays.  Recheck INR in 3 weeks. Protein drink/Ensure started recently 1 per day. Call the Coumadin  Clinic with any upcoming procedures or new medications.  Coumadin  Clinic [phone]

## 2024-07-27 ENCOUNTER — Encounter: Payer: Self-pay | Admitting: Neurology

## 2024-07-27 ENCOUNTER — Ambulatory Visit (INDEPENDENT_AMBULATORY_CARE_PROVIDER_SITE_OTHER): Admitting: Neurology

## 2024-07-27 VITALS — BP 102/66 | HR 73 | Ht 72.0 in | Wt 206.0 lb

## 2024-07-27 DIAGNOSIS — G7000 Myasthenia gravis without (acute) exacerbation: Secondary | ICD-10-CM | POA: Diagnosis not present

## 2024-07-27 DIAGNOSIS — G609 Hereditary and idiopathic neuropathy, unspecified: Secondary | ICD-10-CM

## 2024-07-27 MED ORDER — PREDNISONE 1 MG PO TABS: 4.0000 mg | ORAL_TABLET | Freq: Every day | ORAL | 3 refills | Status: DC

## 2024-07-27 NOTE — Progress Notes (Signed)
 Follow-up Visit   Date: 07/27/24    [name] MRN: [id number] DOB: 1941/07/18   Interim History: [name] is a 83 y.o. right-handed Caucasian male with hyperlipidemia, COPD, peripheral neuropathy, SVT s/p ablation, afib on anticoagulation, BPH, former smoker, and diverticulitis returning to the clinic for follow-up of myasthenia gravis.  The patient was accompanied to the clinic by wife.   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  Initially, hospitalized in Sept 2019, responsive to IVIG and prednisone  30mg /d.  Highest dose of prednisone  is 30mg /d and he has been tolerating slow taper.  Clinically, there is no evidence of disease manifestion or symptomology.   - Reduce prednisone  to 4mg  daily x 1 month, then taper by 1mg  every month - Continue mestinon  30mg  three times daily  2.  Idiopathic peripheral neuropathy affecting the feet manifesting with numbness, stable.   3.  Low blood pressure, asymptomatic.  Rechecked and slightly improed 102/66.  Patient encouraged to monitor at home and follow-up with cardiologist, if he becomes lightheaded.    Return to clinic in 3 months  ---------------------------------------------------------------------- History of present illness: Patient was diagnosed with myasthenia gravis in August 2019 after presenting with left ptosis, facial weakness, double vision, and dysphagia. He went to the ER for these symptoms where stroke was excluded and AChR antibodies returned positive. He was started on mestinon  30mg  three times daily (8am, 4pm, and midnight).  He was started on prednisone  20mg , with no improvement.  He was hospitalized from 9/19 - 07/23/2018 with MG exacerbated and treated with IVIG with great response. His prednisone  was increased to 30mg  daily and he was started on IVIG, which helped control MG symptoms. During 2020, his prednisone  was tapered and he has been on  prednisone  10mg  daily.    He also complains of severe neck pain which is worse with prolonged standing and neck extension.  He usually has to sit back down and rest for relief. He does not have numbness/tingling of the hands or weakness.    He has previously been evaluated for neuropathy at [facility] and [facility] with NCS/EMG which showed peripheral sensorimotor axonal neuropathy.  CSF testing was normal and genetic testing was normal.    UPDATE 05/25/2021:   He was at the beach last week and did well in the hot temperatures. His wife states he has rare spells of difficulty swallowing, but also feels he may not be chewing his food.  He is compliant with prednisone  10mg  daily and mestinon  30mg  three times daily.  UPDATE 09/17/2022:  He is on prednisone  8mg  since the beginning of the month and denies any new weakness.  UPDATE 01/01/2023:  He is here for follow-up visit.  He remains on prednisone  8mg .  He is tolerating this well and denies weakness, droopy eyelids, or double vision.   Wife says that after eating his food, he tends to cough for some time, but there is no choking.  He has a few areas on his skin resected for precancerous lesions and is doing well.   UPDATE 04/16/2023:  He is here for follow-up visit.  He has been on prednisone  7mg  for the past several months and doing very well.  He denies weakness, vision change, or ptosis.    UPDATE 08/27/2023:  He is here for follow-up visit. He remains on prednisone  7mg  and mestinon  30mg  three times daily and denies any issues with double vision, ptosis, difficulty with swallow/speech, or limb weakness. He continues to  remain highly independent and sometimes gets tired easily.   UPDATE 11/27/2023:  He is here for follow-up visit.  He has been on prednisone  6mg  and denies any new complaints.  No double vision, ptosis, speech/swallow difficulty or limb weakness.  He also takes mestinon  30mg  three times daily.  He occasionally has problems with swallowing  food.   UPDATE 07/27/2024:  He is here for follow-up visit.  He remains on prednisone  5mg  daily and doing well.  No double vision, ptosis, speech/swallow difficulty, or limb weakness.  Numbness in the feet is unchanged.  He is seeing spine specialist for low back pain.   Medications:  Current Outpatient Medications on File Prior to Visit  Medication Sig Dispense Refill   albuterol  (VENTOLIN  HFA) 108 (90 Base) MCG/ACT inhaler Inhale 1-2 puffs into the lungs every 6 (six) hours as needed for wheezing or shortness of breath. 18 g 5   atorvastatin  (LIPITOR) 40 MG tablet TAKE 1 TABLET BY MOUTH EVERY DAY 90 tablet 3   Calcium  Carb-Cholecalciferol (CALCIUM  500 + D) 500-5 MG-MCG TABS Take by mouth daily at 6 (six) AM.     Cyanocobalamin  (VITAMIN B-12 PO) Take 5,000 mcg by mouth daily.      doxazosin  (CARDURA ) 8 MG tablet Take 8 mg by mouth daily.     finasteride  (PROSCAR ) 5 MG tablet 5 mg daily.     flecainide  (TAMBOCOR ) 50 MG tablet Take 1 tablet (50 mg total) by mouth 2 (two) times daily. 180 tablet 3   fluticasone  (FLONASE ) 50 MCG/ACT nasal spray Place 2 sprays into both nostrils daily.     furosemide (LASIX) 20 MG tablet Take 20 mg by mouth daily.     glucosamine-chondroitin 500-400 MG tablet Take 1 tablet by mouth daily.     ipratropium (ATROVENT ) 0.03 % nasal spray PLACE 1 SPRAY INTO BOTH NOSTRILS DAILY AS NEEDED FOR RHINITIS. 90 mL 1   metoprolol  succinate (TOPROL -XL) 50 MG 24 hr tablet Take 1 tablet (50 mg total) by mouth daily. 90 tablet 3   montelukast  (SINGULAIR ) 10 MG tablet Take 10 mg by mouth daily.     Multiple Vitamin (MULTIVITAMIN WITH MINERALS) TABS tablet Take 1 tablet by mouth daily.     Potassium Chloride  ER 20 MEQ TBCR Take 1 tablet by mouth daily.     predniSONE  (DELTASONE ) 5 MG tablet Take 1 tablet (5 mg total) by mouth daily with breakfast. 90 tablet 3   pyridostigmine  (MESTINON ) 60 MG tablet TAKE 0.5 TABLETS (30 MG TOTAL) BY MOUTH 3 (THREE) TIMES DAILY. TAKE HALF TABLET AT  8AM, 1PM, AND 6PM 135 tablet 3   warfarin (COUMADIN ) 5 MG tablet TAKE 1 TABLET TO 1 1/2 TABLETS BY MOUTH DAILY AS DIRECTED BY COUMADIN  CLINIC 100 tablet 0   WIXELA INHUB 250-50 MCG/ACT AEPB Inhale 1 puff into the lungs 2 (two) times daily.     Current Facility-Administered Medications on File Prior to Visit  Medication Dose Route Frequency Provider Last Rate Last Admin   triamcinolone  acetonide (KENALOG ) 10 MG/ML injection 10 mg  10 mg Intramuscular Once         Allergies:  Allergies  Allergen Reactions   Ambrosia Trifida (Tall Ragweed) Allergy  Skin Test Anaphylaxis   Bee Pollen Itching    Nasal congestion   Beta Adrenergic Blockers     Other reaction(s): MG   Ciprofloxacin      Other reaction(s): myasthenia gravis   Eliquis  [Apixaban ]     Throat tightening   Erythromycin  Other reaction(s): MG   Pollen Extract Other (See Comments)    Nasal congestion    Vital Signs:  BP (!) 94/59   Pulse 73   Ht 6' (1.829 m)   Wt 206 lb (93.4 kg)   SpO2 100%   BMI 27.94 kg/m   Neurological Exam: MENTAL STATUS including orientation to time, place, person, recent and remote memory, attention span and concentration, language, and fund of knowledge is normal.  Speech is not dysarthric.  CRANIAL NERVES:   Normal conjugate, extra-ocular eye movements in all directions of gaze.  Mild ptosis (stable)  Face is symmetric.  Orbicularis oculi, buccinator, and orbicularis oris is 5/5.  Tongue strength is 5/5  MOTOR:  Motor strength is 5/5 in all extremities.  No fatigability.   REFLEXES:  Reflexes are 2+/4 throughout, except absent at the ankles.  SENSATION:  Vibration intact throughout except absent at the ankles.   COORDINATION/GAIT:    Gait narrow based, stooped, stable, unassisted.   Data: Labs 06/15/2018:  AChR binding 1.69*, MUSK neg   MRI brain wo contrast 06/14/2018: Negative for acute infarct. Chronic microhemorrhage in the left frontal and left occipital parietal lobe most likely  due hypertension.   CTA head and neck 06/14/2018: 1. Negative for large vessel occlusion, dissection, or arterial stenosis in the head or neck. There is minimal atherosclerosis. 2. Positive for generalized arterial tortuosity. Ectatic distal aortic arch and proximal descending aorta. 3. A small 15 mm area of ground-glass opacity in the right upper lobe appears stable since 2015. 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   NCS/EMG of the legs 03/13/2018:  This is an abnormal study. 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   CT chest 07/11/2018:  Negative for thymoma  MRI cervical spine wo contrast 07/14/2018: 1. No acute finding. 2. Disc and facet degeneration causes foraminal impingement that is advanced on the left at c3-4 and on the left at C5-6. 3. Mild right foraminal narrowing at C5-6 and C6-7.  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 Superimposed multilevel lumbosacral radiculopathy cannot be excluded.  Correlate clinically.     Thank you for allowing me to participate in patient's care.  If I can answer any additional questions, I would be pleased to do so.    Sincerely,    Yvonnia Tango K. Tobie, DO

## 2024-07-27 NOTE — Patient Instructions (Signed)
 Take prednisone  as follows: October:  4mg  daily November:  3mg  daily December:  2mg  daily

## 2024-07-29 ENCOUNTER — Other Ambulatory Visit: Payer: Self-pay

## 2024-07-29 DIAGNOSIS — M5416 Radiculopathy, lumbar region: Secondary | ICD-10-CM | POA: Diagnosis not present

## 2024-07-29 DIAGNOSIS — Z6829 Body mass index (BMI) 29.0-29.9, adult: Secondary | ICD-10-CM | POA: Diagnosis not present

## 2024-07-29 DIAGNOSIS — M542 Cervicalgia: Secondary | ICD-10-CM | POA: Diagnosis not present

## 2024-08-04 ENCOUNTER — Ambulatory Visit
Admission: RE | Admit: 2024-08-04 | Discharge: 2024-08-04 | Disposition: A | Discharge status: Home / Self Care | Source: Ambulatory Visit

## 2024-08-04 ENCOUNTER — Other Ambulatory Visit

## 2024-08-04 DIAGNOSIS — M542 Cervicalgia: Secondary | ICD-10-CM

## 2024-08-04 DIAGNOSIS — M47812 Spondylosis without myelopathy or radiculopathy, cervical region: Secondary | ICD-10-CM | POA: Diagnosis not present

## 2024-08-04 DIAGNOSIS — M4802 Spinal stenosis, cervical region: Secondary | ICD-10-CM | POA: Diagnosis not present

## 2024-08-13 ENCOUNTER — Ambulatory Visit: Attending: Cardiology

## 2024-08-13 DIAGNOSIS — I4891 Unspecified atrial fibrillation: Secondary | ICD-10-CM | POA: Insufficient documentation

## 2024-08-13 DIAGNOSIS — Z7901 Long term (current) use of anticoagulants: Secondary | ICD-10-CM | POA: Diagnosis not present

## 2024-08-13 DIAGNOSIS — Z5181 Encounter for therapeutic drug level monitoring: Secondary | ICD-10-CM | POA: Diagnosis not present

## 2024-08-13 LAB — POCT INR: INR: 2.9 (ref 2.0–3.0)

## 2024-08-13 NOTE — Progress Notes (Signed)
 INR 2.9 Please see anticoagulation encounter Continue 1 tablet daily except 1.5 tablets tablets on Sundays, Tuesdays and Thursdays.  Recheck INR in 5 weeks. Protein drink/Ensure started recently 1 per day. Call the Coumadin  Clinic with any upcoming procedures or new medications.  Coumadin  Clinic [phone]

## 2024-08-13 NOTE — Patient Instructions (Signed)
 Continue 1 tablet daily except 1.5 tablets tablets on Sundays, Tuesdays and Thursdays.  Recheck INR in 5 weeks. Protein drink/Ensure started recently 1 per day. Call the Coumadin  Clinic with any upcoming procedures or new medications.  Coumadin  Clinic [phone]

## 2024-08-14 DIAGNOSIS — Z6829 Body mass index (BMI) 29.0-29.9, adult: Secondary | ICD-10-CM | POA: Diagnosis not present

## 2024-08-14 DIAGNOSIS — M438X2 Other specified deforming dorsopathies, cervical region: Secondary | ICD-10-CM | POA: Diagnosis not present

## 2024-08-24 DIAGNOSIS — M542 Cervicalgia: Secondary | ICD-10-CM | POA: Diagnosis not present

## 2024-08-26 ENCOUNTER — Encounter: Payer: Self-pay | Admitting: Cardiology

## 2024-08-26 DIAGNOSIS — M542 Cervicalgia: Secondary | ICD-10-CM | POA: Diagnosis not present

## 2024-08-27 MED ORDER — ATORVASTATIN CALCIUM 40 MG PO TABS: 40.0000 mg | ORAL_TABLET | Freq: Every day | ORAL | 3 refills | Status: AC

## 2024-08-28 DIAGNOSIS — M542 Cervicalgia: Secondary | ICD-10-CM | POA: Diagnosis not present

## 2024-08-31 DIAGNOSIS — M542 Cervicalgia: Secondary | ICD-10-CM | POA: Diagnosis not present

## 2024-09-04 DIAGNOSIS — M542 Cervicalgia: Secondary | ICD-10-CM | POA: Diagnosis not present

## 2024-09-07 DIAGNOSIS — L570 Actinic keratosis: Secondary | ICD-10-CM | POA: Diagnosis not present

## 2024-09-16 DIAGNOSIS — D0439 Carcinoma in situ of skin of other parts of face: Secondary | ICD-10-CM | POA: Diagnosis not present

## 2024-09-16 DIAGNOSIS — D492 Neoplasm of unspecified behavior of bone, soft tissue, and skin: Secondary | ICD-10-CM | POA: Diagnosis not present

## 2024-09-17 ENCOUNTER — Ambulatory Visit: Attending: Cardiology | Admitting: *Deleted

## 2024-09-17 DIAGNOSIS — I4891 Unspecified atrial fibrillation: Secondary | ICD-10-CM | POA: Insufficient documentation

## 2024-09-17 DIAGNOSIS — Z5181 Encounter for therapeutic drug level monitoring: Secondary | ICD-10-CM | POA: Insufficient documentation

## 2024-09-17 LAB — POCT INR: POC INR: 3.3

## 2024-09-17 NOTE — Progress Notes (Signed)
 Lab Results  Component Value Date   INR 3.3 09/17/2024   INR 2.9 08/13/2024   INR 1.7 (A) 07/24/2024    Description   INR 3.3; Hold warfarin today and then continue 1 tablet daily except 1.5 tablets tablets on Sundays, Tuesdays and Thursdays.  Recheck INR in 4 weeks. Protein drink/Ensure started recently 1 per day. Call the Coumadin  Clinic with any upcoming procedures or new medications.  Coumadin  Clinic [phone]

## 2024-09-17 NOTE — Patient Instructions (Signed)
 Description   INR 3.3; Hold warfarin today and then continue 1 tablet daily except 1.5 tablets tablets on Sundays, Tuesdays and Thursdays.  Recheck INR in 4 weeks. Protein drink/Ensure started recently 1 per day. Call the Coumadin  Clinic with any upcoming procedures or new medications.  Coumadin  Clinic [phone]

## 2024-09-23 ENCOUNTER — Encounter: Payer: Self-pay | Admitting: Pulmonary Disease

## 2024-09-23 ENCOUNTER — Ambulatory Visit (INDEPENDENT_AMBULATORY_CARE_PROVIDER_SITE_OTHER): Admitting: Pulmonary Disease

## 2024-09-23 VITALS — BP 124/68 | HR 71 | Temp 97.6°F | Ht 72.0 in | Wt 207.0 lb

## 2024-09-23 DIAGNOSIS — J479 Bronchiectasis, uncomplicated: Secondary | ICD-10-CM | POA: Diagnosis not present

## 2024-09-23 DIAGNOSIS — J449 Chronic obstructive pulmonary disease, unspecified: Secondary | ICD-10-CM | POA: Diagnosis not present

## 2024-09-23 MED ORDER — WIXELA INHUB 250-50 MCG/ACT IN AEPB: 1.0000 | INHALATION_SPRAY | Freq: Two times a day (BID) | RESPIRATORY_TRACT | 12 refills | Status: AC

## 2024-09-23 NOTE — Progress Notes (Signed)
 Synopsis: Referred for chronic cough by Leonel Cole, MD  Subjective:   PATIENT ID: [name] GENDER: male DOB: [birth year], MRN: [id number]  Chief Complaint  Patient presents with   COPD    SOB with exertion and some coughing spells at times.  CC: cough  83 y.o. 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 Never has lived anywhere else other than [place], . [place]. Pets: No pets. No birds. No feather pillows or down comforters.  Travel: none pertinent  Had ablation for some form of SVT vs AF about many years ago  Interval HPI:  Relatively stable.  Cough seems stable.  He reports no better or no worse.  Baseline dyspnea largely unchanged.  No real issues.  He has coughing fits that last a minute or 2.  But the relatively infrequent.  No concern for exacerbation in interim or today.  No worsening cough with persistent mucus production etc. in terms of increased frequency or severity or change in sputum quality.  He reports good adherence to his flutter valve regimen.  For airway clearance.   Past Medical History:  Diagnosis Date   Abscess of sigmoid colon due to diverticulitis 12/26/2018   Appendicitis 09/05/2014   Basal cell carcinoma 10/04/2015   left [id number]   BCC (basal cell carcinoma) 11/03/2019   left shin (txpbx)   Colon polyp    Complication of anesthesia    Difficult to arouse, Combative x 1   COVID    Diverticulitis    Diverticulosis    extensive 3'15.   Dysrhythmia    hx. 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  Many polyps.  rec f/u colonoscopy summer 2020   EUS N/A 11/17/2014   Procedure: ESOPHAGEAL ENDOSCOPIC ULTRASOUND (EUS) RADIAL;  Surgeon: Fallow Cree, MD;  Location: [facility];  Service: Endoscopy;  Laterality: N/A;   FINE NEEDLE ASPIRATION N/A 11/17/2014   Procedure: FINE NEEDLE ASPIRATION (FNA) LINEAR;  Surgeon: Fallow Cree, MD;  Location: [facility];  Service: Endoscopy;  Laterality: N/A;  + or- fna   FLEXIBLE SIGMOIDOSCOPY     HEMORRHOID SURGERY     banding    INGUINAL HERNIA REPAIR Bilateral 2005   '05 bilateral hernia   KNEE ARTHROSCOPY Left 2008   scope '08   LAPAROSCOPIC APPENDECTOMY N/A 09/05/2014   Procedure: APPENDECTOMY LAPAROSCOPIC;  Surgeon: Vicenta Poli, MD;  Location: [facility];  Service: General;  Laterality: N/A;   PROCTOSCOPY N/A 12/26/2018   Procedure: RIGID PROCTOSCOPY;  Surgeon: Sheldon Standing, MD;  Location: [facility];  Service: General;  Laterality: N/A;   ROTATOR CUFF REPAIR Right 2016    Social History   Socioeconomic History   Marital status: Married    Spouse name: Not on file   Number of children: 3   Years  of education: 14   Highest education level: Some college, no degree  Occupational History   Occupation: sales-Retired, works one day a week  Tobacco Use   Smoking status: Former    Current packs/day: 0.00    Average packs/day: 1 pack/day for 47.0 years (47.0 ttl pk-yrs)    Types: Cigarettes    Start date: 10/29/1953    Quit date: 10/29/2000    Years since quitting: 23.9   Smokeless tobacco: Never  Vaping Use   Vaping status: Never Used  Substance and Sexual Activity   Alcohol use: Not Currently    Alcohol/week: 1.0 standard drink of alcohol    Types: 1 Standard drinks or equivalent per week    Comment: socially- nightly 1 cocktail   Drug use: No   Sexual activity: Not on file  Other Topics Concern   Not on file  Social History Narrative   Lives with wife in a one story home with a basement.  Has 3 daughters.  Works one day a week at the [employer].  Retired from airline pilot.  Education: some college.    Caffeine use: decaf   Right handed   One story home   Social Drivers of Health   Financial Resource Strain: Low Risk (05/04/2020)   Received from [facility]   Overall Financial Resource Strain (CARDIA)    Difficulty of Paying Living Expenses: Not hard at all  Food Insecurity: No Food Insecurity (05/04/2020)   Received from [facility]   Hunger Vital Sign    Within the past 12 months, you worried that your food would run out before you got the money to buy more.: Never true    Within the past 12 months, the food you bought just didn't last and you didn't have money to get more.: Never true  Transportation Needs: No Transportation Needs (05/04/2020)   Received from [facility]   PRAPARE - Transportation    Lack of Transportation (Medical): No    Lack of Transportation (Non-Medical): No  Physical Activity: Not on file  Stress: Not on file  Social Connections: Moderately Isolated (05/04/2020)   [facility]   Social  Connection and Isolation Panel    In a typical week, how many times do you talk on the phone with family, friends, or neighbors?: More than three times a week    How often do you get together with friends or relatives?: Twice a week    How often do you attend church or religious services?: Never    Do you belong to any clubs or organizations such as church groups, unions, fraternal or athletic groups, or school groups?: No  How often do you attend meetings of the clubs or organizations you belong to?: Never    Are you married, widowed, divorced, separated, never married, or living with a partner?: Married  Intimate Partner Violence: Not on file     Allergies  Allergen Reactions   Ambrosia Trifida (Tall Ragweed) Allergy  Skin Test Anaphylaxis   Bee Pollen Itching    Nasal congestion   Beta Adrenergic Blockers     Other reaction(s): MG   Ciprofloxacin      Other reaction(s): myasthenia gravis   Eliquis  [Apixaban ]     Throat tightening   Erythromycin     Other reaction(s): MG   Pollen Extract Other (See Comments)    Nasal congestion     Outpatient Medications Prior to Visit  Medication Sig Dispense Refill   atorvastatin  (LIPITOR) 40 MG tablet Take 1 tablet (40 mg total) by mouth daily. 90 tablet 3   Calcium  Carb-Cholecalciferol (CALCIUM  500 + D) 500-5 MG-MCG TABS Take by mouth daily at 6 (six) AM.     Cyanocobalamin  (VITAMIN B-12 PO) Take 5,000 mcg by mouth daily.      doxazosin  (CARDURA ) 8 MG tablet Take 8 mg by mouth daily.     finasteride  (PROSCAR ) 5 MG tablet 5 mg daily.     flecainide  (TAMBOCOR ) 50 MG tablet Take 1 tablet (50 mg total) by mouth 2 (two) times daily. 180 tablet 3   fluticasone  (FLONASE ) 50 MCG/ACT nasal spray Place 2 sprays into both nostrils daily.     furosemide (LASIX) 20 MG tablet Take 20 mg by mouth daily.     glucosamine-chondroitin 500-400 MG tablet Take 1 tablet by mouth daily.     ipratropium (ATROVENT ) 0.03 % nasal spray PLACE 1 SPRAY INTO BOTH  NOSTRILS DAILY AS NEEDED FOR RHINITIS. 90 mL 1   metoprolol  succinate (TOPROL -XL) 50 MG 24 hr tablet Take 1 tablet (50 mg total) by mouth daily. 90 tablet 3   montelukast  (SINGULAIR ) 10 MG tablet Take 10 mg by mouth daily.     Multiple Vitamin (MULTIVITAMIN WITH MINERALS) TABS tablet Take 1 tablet by mouth daily.     predniSONE  (DELTASONE ) 1 MG tablet Take 4 tablets (4 mg total) by mouth daily with breakfast. 120 tablet 3   pyridostigmine  (MESTINON ) 60 MG tablet TAKE 0.5 TABLETS (30 MG TOTAL) BY MOUTH 3 (THREE) TIMES DAILY. TAKE HALF TABLET AT 8AM, 1PM, AND 6PM 135 tablet 3   warfarin (COUMADIN ) 5 MG tablet TAKE 1 TABLET TO 1 1/2 TABLETS BY MOUTH DAILY AS DIRECTED BY COUMADIN  CLINIC 100 tablet 0   albuterol  (VENTOLIN  HFA) 108 (90 Base) MCG/ACT inhaler Inhale 1-2 puffs into the lungs every 6 (six) hours as needed for wheezing or shortness of breath. 18 g 5   WIXELA INHUB  250-50 MCG/ACT AEPB Inhale 1 puff into the lungs 2 (two) times daily.     Potassium Chloride  ER 20 MEQ TBCR Take 1 tablet by mouth daily.     Facility-Administered Medications Prior to Visit  Medication Dose Route Frequency Provider Last Rate Last Admin   triamcinolone  acetonide (KENALOG ) 10 MG/ML injection 10 mg  10 mg Intramuscular Once          Objective:  BP 124/68   Pulse 71   Temp 97.6 F (36.4 C) (Oral)   Ht 6' (1.829 m)   Wt 207 lb (93.9 kg)   SpO2 94% Comment: room air  BMI 28.07 kg/m  Physical Exam:  General appearance: 83 y.o., male, NAD, conversant  Eyes: anicteric  sclerae, moist conjunctivae; no lid-lag; PERRL, tracking appropriately HENT: NCAT no icterus Neck: Trachea midline; no lymphadenopathy, no JVD Lungs: Clear bilaterally, normal work of breathing CV: RRR, no MRGs  Abdomen: Soft, non-tender; non-distended, BS present  Extremities: No peripheral edema, radial and DP pulses present bilaterally  Skin: Normal temperature, turgor and texture; no rash Psych: Appropriate affect Neuro: Alert and  oriented to person and place, no focal deficit   BMI Readings from Last 3 Encounters:  09/23/24 28.07 kg/m  07/27/24 27.94 kg/m  07/09/24 27.12 kg/m   Wt Readings from Last 3 Encounters:  09/23/24 207 lb (93.9 kg)  07/27/24 206 lb (93.4 kg)  07/09/24 200 lb (90.7 kg)    Labs personally reviewed CBC    Component Value Date/Time   WBC 6.8 11/05/2021 0501   RBC 4.03 (L) 11/05/2021 0501   HGB 12.0 (L) 11/05/2021 0501   HGB 15.2 03/08/2016 1133   HGB 15.0 11/02/2014 1104   HCT 37.5 (L) 11/05/2021 0501   HCT 46.7 06/14/2018 1605   HCT 45.6 11/02/2014 1104   PLT 189 11/05/2021 0501   PLT 249 03/08/2016 1133   MCV 93.1 11/05/2021 0501   MCV 88 03/08/2016 1133   MCV 89.1 11/02/2014 1104   MCH 29.8 11/05/2021 0501   MCHC 32.0 11/05/2021 0501   RDW 14.3 11/05/2021 0501   RDW 14.0 03/08/2016 1133   RDW 13.9 11/02/2014 1104   LYMPHSABS 0.8 11/03/2021 1023   LYMPHSABS 1.2 11/02/2014 1104   MONOABS 0.9 11/03/2021 1023   MONOABS 0.5 11/02/2014 1104   EOSABS 0.1 11/03/2021 1023   EOSABS 0.2 11/02/2014 1104   BASOSABS 0.0 11/03/2021 1023   BASOSABS 0.0 11/02/2014 1104    Low level peripheral eosinophilia historically  Timothy grass, ragweed aeroallergen +  Chest Imaging: CTA Chest 04/2020 OSH report reviewed by me: no PE, +bilateral atelectasis, multiple hepatic hypodensities, small PCE   CT Chest 02/10/21: personally reviewed by me and remarkable for stable RUL gg nodule, lower lobe bronchiolitis R>L, secretions in RMB, RLL. Emphysema.  CXR 03/09/23 unchanged  Pulmonary Functions Testing Results:    Latest Ref Rng & Units 07/18/2021   11:54 AM  PFT Results  FVC-Pre L 3.79   FVC-Predicted Pre % 89   FVC-Post L 4.04   FVC-Predicted Post % 95   Pre FEV1/FVC % % 62   Post FEV1/FCV % % 62   FEV1-Pre L 2.34   FEV1-Predicted Pre % 77   FEV1-Post L 2.50   DLCO uncorrected ml/min/mmHg 19.80   DLCO UNC% % 77   DLCO corrected ml/min/mmHg 19.80   DLCO COR %Predicted % 77    DLVA Predicted % 89   TLC L 7.18   TLC % Predicted % 97   RV % Predicted % 123    07/18/21 PFT Reviewed by me ratio 62 mild obstruction, lung volumes with air trapping, very mildly reduced diffusing capacitiy  Reviewed [facility] PFTs: 01/2020 FEV1/FVC 54, post BD FEV1 82.8% without BD response.     Echocardiogram:   05/03/20 UUZ:dfjoo PCE, age appropriate diastolic parameters     Assessment & Plan:   Chronic cough: has had trials of ppi/H2B. Suspect multifactorial with COPD, UACS with persistent postnasal drip, possible component of LPR (despite negative trial of PPI) but would have expected more edematous appearing vocal cords/folds on laryngoscopy, mild bronchiectasis. RLL changes that appear consistent with recurrent aspiration and MEP low which together with his MG raises question of weak/ineffective cough. Has responded to antibiotics in past.  Overall, better than baseline with regular airway clearance.  Continue Wixela, refilled today. Per Dr. Tobie (neurology) would prefer to avoid LAMA if possible with MG.  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  Lung exam is clear, less exacerbations with regular flutter valve use.  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 Refilled wixela mid dose.  MG: MEP was just below LLN. TLC, FVC WNL 02/04/20. Periodic surveillance PFT if symptom of DOE worsen.   RTC 6 months with Dr. Annella Donnice JONELLE Annella, MD [facility] 09/23/2024 11:11 AM

## 2024-09-23 NOTE — Patient Instructions (Addendum)
 Nice to see you again  No changes to medication  I refilled your Wixela, 1 puff in the morning, 1 puff in the evening.  Rinse your mouth out with water after every use.  I recommend getting the RSV vaccine.  You can schedule an appointment or likely can do a walk-in at your local pharmacy to get this.  Return to clinic in 6 months or sooner as needed with Dr. Annella

## 2024-09-28 ENCOUNTER — Encounter: Payer: Self-pay | Admitting: Cardiology

## 2024-09-29 ENCOUNTER — Other Ambulatory Visit: Payer: Self-pay

## 2024-09-29 DIAGNOSIS — I4891 Unspecified atrial fibrillation: Secondary | ICD-10-CM

## 2024-09-29 MED ORDER — WARFARIN SODIUM 5 MG PO TABS: ORAL_TABLET | ORAL | 0 refills | Status: AC

## 2024-09-30 ENCOUNTER — Encounter: Payer: Self-pay | Admitting: Neurology

## 2024-09-30 ENCOUNTER — Telehealth: Payer: Self-pay | Admitting: Neurology

## 2024-09-30 ENCOUNTER — Ambulatory Visit: Admitting: Neurology

## 2024-09-30 NOTE — Telephone Encounter (Signed)
 Pt's wife Reena called in this morning and she is concern with pt falling a sleep  all the time during the day. Please call. Thanks

## 2024-10-01 NOTE — Telephone Encounter (Signed)
 Called patients wife and unable to leave a message as mailbox is full.

## 2024-10-02 NOTE — Telephone Encounter (Signed)
 Called patients wife back and she informed me that patient has been falling asleep during the day more than usual. Patients wife has noticed this more than usual in the last month.   Patients wife also stated that patient had an episode for one night where he was peeing every 20 minutes. I asked patients wife if they have contacted patients PCP about this and she stated no because it has stopped and only occurred one night.  Patients wife also stated that they tried lowering patient prednisone  from 4 mg to 3 mg for a bit and patient didn't tolerate that well and thought maybe the sleepiness contributed to that. They have put patient back on 4mg .

## 2024-10-12 ENCOUNTER — Ambulatory Visit: Attending: Emergency Medicine | Admitting: Emergency Medicine

## 2024-10-12 ENCOUNTER — Encounter: Payer: Self-pay | Admitting: Emergency Medicine

## 2024-10-12 ENCOUNTER — Ambulatory Visit: Admitting: Emergency Medicine

## 2024-10-12 VITALS — BP 96/64 | HR 80 | Ht 72.0 in | Wt 203.0 lb

## 2024-10-12 DIAGNOSIS — I340 Nonrheumatic mitral (valve) insufficiency: Secondary | ICD-10-CM | POA: Diagnosis present

## 2024-10-12 DIAGNOSIS — I493 Ventricular premature depolarization: Secondary | ICD-10-CM | POA: Diagnosis present

## 2024-10-12 DIAGNOSIS — I2510 Atherosclerotic heart disease of native coronary artery without angina pectoris: Secondary | ICD-10-CM | POA: Diagnosis not present

## 2024-10-12 DIAGNOSIS — I471 Supraventricular tachycardia, unspecified: Secondary | ICD-10-CM | POA: Diagnosis not present

## 2024-10-12 DIAGNOSIS — E785 Hyperlipidemia, unspecified: Secondary | ICD-10-CM

## 2024-10-12 DIAGNOSIS — Z8679 Personal history of other diseases of the circulatory system: Secondary | ICD-10-CM | POA: Diagnosis not present

## 2024-10-12 DIAGNOSIS — I480 Paroxysmal atrial fibrillation: Secondary | ICD-10-CM

## 2024-10-12 DIAGNOSIS — R600 Localized edema: Secondary | ICD-10-CM | POA: Diagnosis not present

## 2024-10-12 NOTE — Progress Notes (Signed)
 Cardiology Office Note:    Date:  10/12/2024  ID:  [name], DOB 1941-04-09, MRN [id number] PCP: Leonel Cole, MD  [facility] Providers Cardiologist:  Oneil Parchment, MD       Patient Profile:       Chief Complaint: 72-month follow-up History of Present Illness:  [name] is a 83 y.o. male with visit-pertinent history of  persistent atrial fibrillation, prior pericarditis, myasthenia gravis, COPD, hyperlipidemia, PSVT s/p ablation in 2012   Diagnosed with atrial fibrillation incidentally on 05/10/2020.  Unfortunately he was unable to tolerate Eliquis  or Xarelto , currently on Coumadin .  He was diagnosed with acute pericarditis in July 2021 while in [place].  He had subtle ST segment changes noted on his EKG.  CRP was negative.  He did have a small pericardial effusion.  Did not take colchicine because of side effects however completed course of ibuprofen .   He established with cardiology service on 10/14/2018 with Dr. Parchment for preoperative cardiovascular risk assessment.  In 2012 he was seen by Dr. Waddell with EP and underwent catheter ablation of PSVT.   He was seen in office on 03/08/2023 by Dr. Parchment.  He was currently residing in sinus rhythm.  His atorvastatin  was increased from 10 mg to 20 mg.  Overall he was doing well from a cardiac standpoint.  He was to follow-up in 1 year.   Seen on 02/14/2024.  He presented with recurrent episodes of lightheadedness and fatigue.  He reported that he gets tired easily even after short walks.  He is also experiencing increased leg swelling over the last 2 to 3 months.  His EKG showed NSR with PVCs.  Echocardiogram was ordered and completed on 02/27/2024 showing LVEF 55 to 60%, no RWMA, mild LVH, RV function and size normal, normal PASP, mild mitral valve regurgitation.  ZIO monitor was worn and completed on 02/14/2024 showing 187 supraventricular tachycardia runs with the run with the fastest interval lasting 28.6 seconds with a  max rate of 174 bpm and longest lasting 56.9 seconds with an average rate of 144 bpm.  There was a 4% PAC burden and 1.4% PVC burden.  Last seen in clinic on 04/16/2024.  He was without any further episodes of lightheadedness or dizziness.  Reports leg swelling well-controlled on as needed Lasix.  Seen by EP on 05/26/2024.  It was reported his heart monitor showed brief episodes of atrial fibrillation and not SVT as it was read out.  It was reported he had no recurrent AVNRT.  He was started on flecainide  50 mg twice daily for his PAF.   Discussed the use of AI scribe software for clinical note transcription with the patient, who gave verbal consent to proceed.  History of Present Illness [name] is an 83 year old male with atrial fibrillation who presents for follow-up of his heart condition and medication management.  Today patient presents to clinic with his wife.  He is doing well without acute cardiovascular concerns today.  He denies palpitations, heart racing, lightheadedness, dizziness, or chest pain and feels well on this regimen.   He monitors blood pressure at home and notes occasional low readings, including today, without associated lightheadedness or dizziness.  He denies any syncope, presyncope.  He has intermittent bilateral swelling in the feet without significant weight gain or swelling above the ankles. He is on a tapering dose of prednisone  at 4 mg daily and takes Lasix for fluid control.  He has COPD with associated shortness of  breath and fatigue, that is unchanged and stable.   Review of systems:  Please see the history of present illness. All other systems are reviewed and otherwise negative.      Studies Reviewed:    EKG Interpretation Date/Time:  Monday October 12 2024 10:46:02 EST Ventricular Rate:  80 PR Interval:  242 QRS Duration:  108 QT Interval:  390 QTC Calculation: 449 R Axis:   -22  Text Interpretation: Sinus rhythm with 1st degree A-V  block Nonspecific ST and T wave abnormality When compared with ECG of 19-Jun-2024 10:59, Abberant conduction is no longer Present Nonspecific T wave abnormality has replaced inverted T waves in Anterolateral leads Confirmed by Rana Dixon [id number]) on 10/12/2024 12:18:23 PM    ZIO 02/14/2024   No episodes of atrial fibrillation detected   Sinus rhtyhm with frequent PAC's and occasional PVC's   Runs of atrial tachycardia were noted - benign   Baseline wander artifact noted     Patch Wear Time:  8 days and 15 hours (2025-05-23T17:44:43-0400 to 2025-06-01T09:12:01-399)   Patient had a min HR of 27 bpm, max HR of 174 bpm, and avg HR of 66 bpm. Predominant underlying rhythm was Sinus Rhythm. First Degree AV Block was present. 187 Supraventricular Tachycardia runs occurred, the run with the fastest interval lasting 28.6  secs with a max rate of 174 bpm, the longest lasting 56.9 secs with an avg rate of 144 bpm. Second Degree AV Block-Mobitz I (Wenckebach) was present. Isolated SVEs were occasional (4.0%, 31970), SVE Couplets were rare (<1.0%, 1648), and SVE Triplets were  rare (<1.0%, 407). Isolated VEs were occasional (1.4%, 10899), VE Couplets were rare (<1.0%, 75), and VE Triplets were rare (<1.0%, 5). Ventricular Bigeminy and Trigeminy were present. Difficulty discerning atrial activity making definitive diagnosis  difficult to ascertain.   Echocardiogram 02/27/2024  1. Left ventricular ejection fraction, by estimation, is 55 to 60%. The  left ventricle has normal function. The left ventricle has no regional  wall motion abnormalities. There is mild left ventricular hypertrophy.  Left ventricular diastolic parameters  are indeterminate.   2. Right ventricular systolic function is normal. The right ventricular  size is normal. There is normal pulmonary artery systolic pressure.   3. The mitral valve is normal in structure. Mild mitral valve  regurgitation. No evidence of mitral stenosis.    4. The aortic valve is tricuspid. Aortic valve regurgitation is trivial.  No aortic stenosis is present.   5. The inferior vena cava is normal in size with greater than 50%  respiratory variability, suggesting right atrial pressure of 3 mmHg.   6. Ascending aorta measurements are within normal limits for age when  indexed to body surface area.   Risk Assessment/Calculations:    CHA2DS2-VASc Score = 4   This indicates a 4.8% annual risk of stroke. The patient's score is based upon: CHF History: 0 HTN History: 1 Diabetes History: 0 Stroke History: 0 Vascular Disease History: 1 Age Score: 2 Gender Score: 0             Physical Exam:   VS:  BP 96/64   Pulse 80   Ht 6' (1.829 m)   Wt 203 lb (92.1 kg)   BMI 27.53 kg/m    Wt Readings from Last 3 Encounters:  10/12/24 203 lb (92.1 kg)  09/23/24 207 lb (93.9 kg)  07/27/24 206 lb (93.4 kg)    GEN: Well nourished, well developed in no acute distress NECK: No JVD; No carotid bruits  CARDIAC: RRR, no murmurs, rubs, gallops RESPIRATORY:  Clear to auscultation without rales, wheezing or rhonchi  ABDOMEN: Soft, non-tender, non-distended EXTREMITIES:  No edema; No acute deformity      Assessment and Plan:  Paroxysmal atrial fibrillation Diagnosed 05/10/2020 incidentally ZIO 03/2024 read out as no episodes of atrial fibrillation detected however per Dr. Waddell who saw patient on 04/2024 reported that the recent cardiac monitor showed brief episodes of atrial fibrillation and not SVT and he was subsequently started on flecainide  - EKG today shows patient is maintaining NSR - Unable to tolerate Eliquis  or Xarelto , currently on Coumadin  - Continue metoprolol  XL 50 mg daily - Continue flecainide  50 mg twice daily - Continue Coumadin  per Coumadin  clinic  - I will have him schedule an appointment with an EP APP for close follow-up - Management per EP   PAC/PVC ZIO 04/09/2024 showed 4% PAC burden and 1.4% PVC burden - He remains  asymptomatic today - Continue metoprolol  XL 50 mg daily   H/o pericarditis Diagnosed July 2021.  Did have subtle ST segment changes, CRP negative, did have small pericardial effusion, did not take colchicine, completed course of ibuprofen  Follow-up echocardiogram 10/2020 showed normal LV function without pericardial effusion - Remains stable without complaints of angina   PSVT S/p ablation in 2012 by Dr. Waddell CROFTS 03/2024 read out as 187 SVT runs occurring, however after further evaluation by Dr. Waddell on 04/2024 these episodes were not SVT and in fact atrial fibrillation - No recurrent AVNRT - Continue metoprolol  XL 50 mg daily - Management per EP   Coronary artery calcification / Hyperlipidemia LDL 59 on 06/2024 and well-controlled Coronary artery calcification noted on CT scan for lung cancer screening - Today he is stable without anginal symptoms.  No indication for further ischemic evaluation at this time - Continue atorvastatin  40 mg daily   Leg swelling Echocardiogram 02/2024 with LVEF 55 to 60%, no RWMA, mild LVH, indeterminate diastolic parameters - Currently well-controlled.  He appears well compensated on exam - There is a possibility daily prednisone  for his myasthenia gravis could be exacerbating his pedal edema  - Likely a combination of diet, deconditioning, diastolic dysfunction, and venous insufficiency.   - Encouraged low-sodium diet, leg elevation, and lower extremity stockings - Continue Lasix  20 mg as needed    Myasthenia gravis - Stable on prednisone , managed by Dr. Tobie   Mild mitral valve regurgitation Echocardiogram 02/2024 showed mild mitral valve regurgitation - No indication for further intervention at this time      Dispo:  Return in about 6 months (around 04/12/2025).  Signed, Lum LITTIE Louis, NP

## 2024-10-12 NOTE — Patient Instructions (Signed)
 Medication Instructions:  NO CHANGES  Lab Work: NONE TO BE DONE TODAY.  Testing/Procedures: NONE  Follow-Up: At [facility], you and your health needs are our priority.  As part of our continuing mission to provide you with exceptional heart care, our providers are all part of one team.  This team includes your primary Cardiologist (physician) and Advanced Practice Providers or APPs (Physician Assistants and Nurse Practitioners) who all work together to provide you with the care you need, when you need it.  Your next appointment:   2 WEEKS WITH ANY EP APP 6 MONTHS WITH DR. JEFFRIE  Provider:   Oneil Jeffrie, MD

## 2024-10-15 ENCOUNTER — Ambulatory Visit

## 2024-10-15 DIAGNOSIS — Z7901 Long term (current) use of anticoagulants: Secondary | ICD-10-CM | POA: Insufficient documentation

## 2024-10-15 DIAGNOSIS — Z5181 Encounter for therapeutic drug level monitoring: Secondary | ICD-10-CM | POA: Diagnosis present

## 2024-10-15 DIAGNOSIS — I4891 Unspecified atrial fibrillation: Secondary | ICD-10-CM | POA: Insufficient documentation

## 2024-10-15 LAB — POCT INR: INR: 3 (ref 2.0–3.0)

## 2024-10-15 NOTE — Progress Notes (Signed)
 INR 3.0 Please see anticoagulation encounter continue 1 tablet daily except 1.5 tablets  on Sundays, Tuesdays and Thursdays.  Recheck INR in 6 weeks. Protein drink/Ensure started recently 1 per day. Call the Coumadin  Clinic with any upcoming procedures or new medications.  Coumadin  Clinic [phone]

## 2024-10-15 NOTE — Patient Instructions (Signed)
 continue 1 tablet daily except 1.5 tablets  on Sundays, Tuesdays and Thursdays.  Recheck INR in 6 weeks. Protein drink/Ensure started recently 1 per day. Call the Coumadin  Clinic with any upcoming procedures or new medications.  Coumadin  Clinic [phone]

## 2024-10-19 ENCOUNTER — Ambulatory Visit: Admitting: Neurology

## 2024-10-19 ENCOUNTER — Encounter: Payer: Self-pay | Admitting: Neurology

## 2024-10-19 VITALS — BP 102/63 | HR 72 | Ht 72.0 in | Wt 204.0 lb

## 2024-10-19 DIAGNOSIS — G7000 Myasthenia gravis without (acute) exacerbation: Secondary | ICD-10-CM | POA: Diagnosis not present

## 2024-10-19 DIAGNOSIS — M47812 Spondylosis without myelopathy or radiculopathy, cervical region: Secondary | ICD-10-CM

## 2024-10-19 MED ORDER — PREDNISONE 1 MG PO TABS: ORAL_TABLET | ORAL | 1 refills | Status: DC

## 2024-10-19 NOTE — Patient Instructions (Signed)
 Reduce prednisone  to 3mg  daily x 2 weeks, then 2mg  x 1 week  Send MyChart update in 1 month to determine further dose lowering

## 2024-10-19 NOTE — Progress Notes (Signed)
 "   Follow-up Visit   Date: 10/19/2024    [name] MRN: [id number] DOB: 05-Jun-1941   Interim History: [name] is a 83 y.o. right-handed Caucasian male with hyperlipidemia, COPD, peripheral neuropathy, SVT s/p ablation, afib on anticoagulation, BPH, former smoker, and diverticulitis returning to the clinic for follow-up of myasthenia gravis.  The patient was accompanied to the clinic by wife.   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  Highest dose was prednisone  30mg /d.  Myasthenia gravis well-controlled and there is no evidence of disease manifestation on exam. Fatigue and malaise on prednisone  reduction not attributed to myasthenia. Current prednisone  dose is subtherapeutic and unlikely effective for myasthenia.  He has been on a very slow taper and I explained that with his MG being so well-controlled, we should be able to completely taper him off prednisone .  I tried to reassure patient as there is some hesitation in coming off this.   - Reduce prednisone  to 3 mg daily for 2 weeks, then decrease to 2 mg daily for 2 weeks. - Mychart update in 1 month, if he is doing well, continue to taper prednisone  - Continue pyridostigmine  30 mg three times daily.  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  No benefit with PT.  He is interested in steroid injections, so will refer to PM&R.   Return to clinic in 2 months  ---------------------------------------------------------------------- History of present illness: Patient was diagnosed with myasthenia gravis in August 2019 after presenting with left ptosis, facial weakness, double vision, and dysphagia. He went to the ER for these symptoms where stroke was excluded and AChR antibodies returned  positive. He was started on mestinon  30mg  three times daily (8am, 4pm, and midnight).  He was started on prednisone  20mg , with no improvement.  He was hospitalized from 9/19 - 07/23/2018 with MG exacerbated and treated with IVIG with great response. His prednisone  was increased to 30mg  daily and he was started on IVIG, which helped control MG symptoms. During 2020, his prednisone  was tapered and he has been on prednisone  10mg  daily.    He also complains of severe neck pain which is worse with prolonged standing and neck extension.  He usually has to sit back down and rest for relief. He does not have numbness/tingling of the hands or weakness.    He has previously been evaluated for neuropathy at [facility] and [facility] with NCS/EMG which showed peripheral sensorimotor axonal neuropathy.  CSF testing was normal and genetic testing was normal.    UPDATE 01/01/2023:  He is here for follow-up visit.  He remains on prednisone  8mg .  He is tolerating this well and denies weakness, droopy eyelids, or double vision.   Wife says that after eating his food, he tends to cough for some time, but there is no choking.  He has a few areas on his skin resected for precancerous lesions and is doing well.   UPDATE 04/16/2023:  He is here for follow-up visit.  He has been on prednisone  7mg  for the past several months and doing very well.  He denies weakness, vision change, or ptosis.    UPDATE 11/27/2023:  He is here for follow-up visit.  He has been on prednisone  6mg  and denies any new complaints.  No double vision, ptosis, speech/swallow difficulty or limb weakness.  He also takes mestinon  30mg  three times daily.  He occasionally has problems with swallowing food.   UPDATE 07/27/2024:  He is here for follow-up visit.  He remains on prednisone  5mg  daily and doing well.  No double vision, ptosis, speech/swallow difficulty, or limb weakness.  Numbness in the feet is unchanged.  He is seeing spine specialist for low back pain.    UPDATE 10/19/2024:   Discussed the use of AI scribe software for clinical note transcription with the patient, who gave verbal consent to proceed.  He has been experiencing fatigue and malaise after attempting to reduce his prednisone  dose from 4mg  to 3mg . He had no droopy eyelids, difficulty swallowing, double vision, or other myasthenic symptoms. He returned to a 4mg  dose after these symptoms appeared.  He experiences intermittent neck pain located along the neck, sometimes extending to the shoulders. Physical therapy, including neck stretching exercises, was unhelpful. Aspirin  cream provides effective relief. An MRI was performed and he consulted with a spine specialist, who did not recommend surgery; physical therapy was suggested instead.    Medications:  Current Outpatient Medications on File Prior to Visit  Medication Sig Dispense Refill   atorvastatin  (LIPITOR) 40 MG tablet Take 1 tablet (40 mg total) by mouth daily. 90 tablet 3   Calcium  Carb-Cholecalciferol (CALCIUM  500 + D) 500-5 MG-MCG TABS Take by mouth daily at 6 (six) AM.     Cyanocobalamin  (VITAMIN B-12 PO) Take 5,000 mcg by mouth daily.      doxazosin  (CARDURA ) 8 MG tablet Take 8 mg by mouth daily.     finasteride  (PROSCAR ) 5 MG tablet 5 mg daily.     flecainide  (TAMBOCOR ) 50 MG tablet Take 1 tablet (50 mg total) by mouth 2 (two) times daily. 180 tablet 3   fluticasone  (FLONASE ) 50 MCG/ACT nasal spray Place 2 sprays into both nostrils daily.     furosemide (LASIX) 20 MG tablet Take 20 mg by mouth daily.     glucosamine-chondroitin 500-400 MG tablet Take 1 tablet by mouth daily.     ipratropium (ATROVENT ) 0.03 % nasal spray PLACE 1 SPRAY INTO BOTH NOSTRILS DAILY AS NEEDED FOR RHINITIS. 90 mL 1   metoprolol  succinate (TOPROL -XL) 50 MG 24 hr tablet Take 1 tablet (50 mg total) by mouth daily. 90 tablet 3   montelukast  (SINGULAIR ) 10 MG tablet Take 10 mg by mouth daily.     Multiple Vitamin (MULTIVITAMIN WITH MINERALS) TABS  tablet Take 1 tablet by mouth daily.     predniSONE  (DELTASONE ) 1 MG tablet Take 4 tablets (4 mg total) by mouth daily with breakfast. 120 tablet 3   pyridostigmine  (MESTINON ) 60 MG tablet TAKE 0.5 TABLETS (30 MG TOTAL) BY MOUTH 3 (THREE) TIMES DAILY. TAKE HALF TABLET AT 8AM, 1PM, AND 6PM 135 tablet 3   warfarin (COUMADIN ) 5 MG tablet TAKE 1 TABLET TO 1 1/2 TABLETS BY MOUTH DAILY AS DIRECTED BY COUMADIN  CLINIC 100 tablet 0   WIXELA INHUB  250-50 MCG/ACT AEPB Inhale 1 puff into the lungs 2 (two) times daily. 60 each 12   Current Facility-Administered Medications on File Prior to Visit  Medication Dose Route Frequency Provider Last Rate Last Admin   triamcinolone  acetonide (KENALOG ) 10 MG/ML injection 10 mg  10 mg Intramuscular Once         Allergies:  Allergies  Allergen Reactions   Ambrosia Trifida (Tall Ragweed) Allergy  Skin Test Anaphylaxis   Bee Pollen Itching    Nasal congestion   Beta Adrenergic Blockers  Other reaction(s): MG   Ciprofloxacin      Other reaction(s): myasthenia gravis   Eliquis  [Apixaban ]     Throat tightening   Erythromycin     Other reaction(s): MG   Pollen Extract Other (See Comments)    Nasal congestion    Vital Signs:  BP 102/63   Pulse 72   Ht 6' (1.829 m)   Wt 204 lb (92.5 kg)   SpO2 92%   BMI 27.67 kg/m   Neurological Exam: MENTAL STATUS including orientation to time, place, person, recent and remote memory, attention span and concentration, language, and fund of knowledge is normal.  Speech is not dysarthric.  CRANIAL NERVES:   Normal conjugate, extra-ocular eye movements in all directions of gaze.  Mild bilateral ptosis (stable), no worsening with sustained upgaze.  Face is symmetric.  Orbicularis oculi, buccinator, and orbicularis oris is 5/5.  Tongue strength is 5/5  MOTOR:  Motor strength is 5/5 in all extremities.  No fatigability.   REFLEXES:  Reflexes are 2+/4 throughout, except absent at the ankles.  COORDINATION/GAIT:    Gait  narrow based, stooped, stable, unassisted.   Data: Labs 06/15/2018:  AChR binding 1.69*, MUSK neg   MRI brain wo contrast 06/14/2018: Negative for acute infarct. 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   CTA head and neck 06/14/2018: 1. Negative for large vessel occlusion, dissection, or arterial stenosis in the head or neck. There is minimal atherosclerosis. 2. Positive for generalized arterial tortuosity. Ectatic distal aortic arch and proximal descending aorta. 3. A small 15 mm area of ground-glass opacity in the right upper lobe appears stable since 2015. 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   NCS/EMG of the legs 03/13/2018:  This is an abnormal study. 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   CT chest 07/11/2018:  Negative for thymoma  MRI cervical spine wo contrast 07/14/2018: 1. No acute finding. 2. Disc and facet degeneration causes foraminal impingement that is advanced on the left at c3-4 and on the left at C5-6. 3. Mild right foraminal narrowing at C5-6 and C6-7.  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 Superimposed multilevel lumbosacral radiculopathy cannot be excluded.  Correlate clinically.  MRI cervical spine 08/04/2024: 1. Cervical spine degeneration superimposed on chronic C2-C3 ankylosis, and probable developing ankylosis now at C4-C5. 2. Degenerative anterior endplate marrow edema at C5-C6, and mildly affecting chronically degenerated bilateral C3-C4 facets. 3. Mild spinal stenosis at C3-C4 and C5 C6 but no convincing spinal cord mass effect. No cord signal abnormality. Caudal disc herniation at C6-C7 without significant spinal stenosis. 4. Multilevel moderate and severe degenerative neural foraminal  stenosis, worst at the left C4, left C6 nerve levels    Thank you for allowing me to participate in patient's care.  If I can answer any additional questions, I would be pleased to do so.    Sincerely,    Nusaiba Guallpa K. Tobie, DO "

## 2024-10-28 NOTE — Progress Notes (Unsigned)
" °  Electrophysiology Office Note:   Date:  10/28/2024  ID:  [name], [name] [birth year], MRN [id number]  Primary Cardiologist: Oneil Parchment, MD Primary Heart Failure: None Electrophysiologist: None  {Click to update primary MD,subspecialty MD or APP then REFRESH:1}    History of Present Illness:   [name] is a 83 y.o. male with h/o SVT / AVNRT, AF seen today for routine electrophysiology followup.   Since last being seen in our clinic the patient reports doing ***.   He*** denies chest pain, palpitations, dyspnea, PND, orthopnea, nausea, vomiting, dizziness, syncope, edema, weight gain, or early satiety.   Review of systems complete and found to be negative unless listed in HPI.   EP Information / Studies Reviewed:    EKG is ordered today. Personal review as below.       Arrhythmia / AAD / Pertinent EP Studies AVNRT s/p ablation 2012 AF  > initial Dx 04/2020    Risk Assessment/Calculations:    CHA2DS2-VASc Score = 4  {Confirm score is correct.  If not, click here to update score.  REFRESH note.  :1} This indicates a 4.8% annual risk of stroke. The patient's score is based upon: CHF History: 0 HTN History: 1 Diabetes History: 0 Stroke History: 0 Vascular Disease History: 1 Age Score: 2 Gender Score: 0   {This patient has a significant risk of stroke if diagnosed with atrial fibrillation.  Please consider VKA or DOAC agent for anticoagulation if the bleeding risk is acceptable.   You can also use the SmartPhrase .[email or web address] for documentation.   :[id number]} No BP recorded.  {Refresh Note OR Click here to enter BP  :1}***        Physical Exam:   VS:  There were no vitals taken for this visit.   Wt Readings from Last 3 Encounters:  10/19/24 204 lb (92.5 kg)  10/12/24 203 lb (92.1 kg)  09/23/24 207 lb (93.9 kg)     GEN: Well nourished, well developed in no acute distress NECK: No JVD; No carotid bruits CARDIAC: {EPRHYTHM:[id number]}, no murmurs, rubs,  gallops RESPIRATORY:  Clear to auscultation without rales, wheezing or rhonchi  ABDOMEN: Soft, non-tender, non-distended EXTREMITIES:  No edema; No deformity   ASSESSMENT AND PLAN:    Paroxysmal Atrial Fibrillation  High Risk Medication Monitoring: Flecainide   CHA2DS2-VASc 5, mild aortic atherosclerosis on CT chest imaging 2022, ECHO LVEF 55-60% in 02/2024 -OAC for stroke prophylaxis  -EKG with ***NSR, stable intervals  -no further AF episodes ***  -monitors with ***  -dizziness? Visual changes ? ***  -ETT ? ***   Secondary Hypercoagulable State  -continue Coumadin , follows with Coumadin  Clinic  -INR 3 on 10/15/24   SVT / AVNRT s/p Ablation -no further symptoms post ablation    Follow up with Dr. Almetta {EPFOLLOW LE:[id number]} > transition to new EP MD reviewed with patient ***  Signed, Daphne Barrack, NP-C, AGACNP-BC [facility] - Electrophysiology  10/28/2024, 2:33 PM  "

## 2024-10-30 ENCOUNTER — Ambulatory Visit: Attending: Pulmonary Disease | Admitting: Pulmonary Disease

## 2024-10-30 ENCOUNTER — Encounter: Payer: Self-pay | Admitting: Pulmonary Disease

## 2024-10-30 VITALS — BP 112/56 | HR 70 | Ht 72.0 in | Wt 203.0 lb

## 2024-10-30 DIAGNOSIS — I480 Paroxysmal atrial fibrillation: Secondary | ICD-10-CM | POA: Diagnosis not present

## 2024-10-30 DIAGNOSIS — Z5181 Encounter for therapeutic drug level monitoring: Secondary | ICD-10-CM | POA: Insufficient documentation

## 2024-10-30 DIAGNOSIS — Z7901 Long term (current) use of anticoagulants: Secondary | ICD-10-CM | POA: Insufficient documentation

## 2024-10-30 NOTE — Patient Instructions (Signed)
 Medication Instructions:  No medications changes today   *If you need a refill on your cardiac medications before your next appointment, please call your pharmacy*  Lab Work: No lab work today If you have labs (blood work) drawn today and your tests are completely normal, you will receive your results only by: MyChart Message (if you have MyChart) OR A paper copy in the mail If you have any lab test that is abnormal or we need to change your treatment, we will call you to review the results.  Testing/Procedures: No testing/procedures were scheduled today  Follow-Up: At [facility], you and your health needs are our priority.  As part of our continuing mission to provide you with exceptional heart care, our providers are all part of one team.  This team includes your primary Cardiologist (physician) and Advanced Practice Providers or APPs (Physician Assistants and Nurse Practitioners) who all work together to provide you with the care you need, when you need it.  Your next appointment:   3 month(s)  Provider:   You may see Donnice DELENA Primus, MD or one of the following Advanced Practice Providers on your designated Care Team:    Daphne Barrack, NP    We recommend signing up for the patient portal called MyChart.  Sign up information is provided on this After Visit Summary.  MyChart is used to connect with patients for Virtual Visits (Telemedicine).  Patients are able to view lab/test results, encounter notes, upcoming appointments, etc.  Non-urgent messages can be sent to your provider as well.   To learn more about what you can do with MyChart, go to [email or web address].

## 2024-11-19 ENCOUNTER — Telehealth (HOSPITAL_BASED_OUTPATIENT_CLINIC_OR_DEPARTMENT_OTHER): Payer: Self-pay | Admitting: *Deleted

## 2024-11-19 NOTE — Telephone Encounter (Signed)
"  ° °  Pre-operative Risk Assessment    Patient Name: [name]  DOB: 1941/01/09 MRN: [id number]    Date of last office visit: 10/30/24 DAPHNE BARRACK, NP Date of next office visit: 01/28/25 DAPHNE BARRACK, NP 3 MONTH F/U   Request for Surgical Clearance    Procedure:  CERVICAL EPIDURAL INJECTION  Date of Surgery:  Clearance TBD (STAT PER FORM)                                Surgeon:  DR. JOANIE Surgeon's Group or Practice Name:  [facility] Phone number:  [phone] Fax number:  [phone]   Type of Clearance Requested:   - Medical  - Pharmacy:  Hold Warfarin (Coumadin ) x 5 DAYS PRIOR AND RESUME THE NEXT DAY   Type of Anesthesia:  Not Indicated   Additional requests/questions:    Bonney Niels Jest   11/19/2024, 3:49 PM  "

## 2024-11-19 NOTE — Telephone Encounter (Signed)
 [place]  You saw this patient on 10/12/24. Per office protocol, will you please comment on medical clearance for cervical epidural injection?  Please route your response to P CV DIV Preop. I will communicate with requesting office once you have given recommendations.   Thank you!  Mardy Pizza, NP

## 2024-11-19 NOTE — Telephone Encounter (Signed)
 Please advise holding Warfarin prior to cervical epidural injection. Last labs 09/2024.  Thank you!  AW

## 2024-11-22 ENCOUNTER — Encounter: Payer: Self-pay | Admitting: Neurology

## 2024-11-23 NOTE — Telephone Encounter (Signed)
 Patient with diagnosis of atrial fibrillation on warfarin for anticoagulation.    Procedure:  CERVICAL EPIDURAL INJECTION   Date of Surgery:  Clearance TBD (STAT PER FORM)    CHA2DS2-VASc Score = 4   This indicates a 4.8% annual risk of stroke. The patient's score is based upon: CHF History: 0 HTN History: 1 Diabetes History: 0 Stroke History: 0 Vascular Disease History: 1 Age Score: 2 Gender Score: 0    CrCl 76 Platelet count 234  Patient has not had an Afib/aflutter ablation in the last 3 months, DCCV within the last 4 weeks or a watchman implanted in the last 45 days   Per office protocol, patient can hold warfarin for 5 days prior to procedure.   Patient will not need bridging with Lovenox  (enoxaparin ) around procedure.  **This guidance is not considered finalized until pre-operative APP has relayed final recommendations.**

## 2024-11-24 NOTE — Telephone Encounter (Signed)
"  ° °  Patient Name: [name]  DOB: [birth year] MRN: [id number]  Primary Cardiologist: Oneil Parchment, MD  Chart reviewed as part of pre-operative protocol coverage. Patient was recently see in the office by Lum Louis, NP, on 10/12/2024. Per [name]: At that time, he was doing well without anginal symptoms and able to complete greater than 4 METS.  He can proceed with the procedure without further cardiovascular testing.  Per Pharmacy and office protocol, Per office protocol, patient can hold warfarin for 5 days prior to procedure.  Patient will not need bridging with Lovenox  (enoxaparin ) around procedure.  I will route this recommendation to the requesting party via Epic fax function and remove from pre-op pool.  Please call with questions.  Douglas Norwood E [name], PA-C 11/24/2024, 7:08 PM  "

## 2024-11-26 ENCOUNTER — Ambulatory Visit: Attending: Cardiology | Admitting: *Deleted

## 2024-11-26 DIAGNOSIS — Z5181 Encounter for therapeutic drug level monitoring: Secondary | ICD-10-CM | POA: Diagnosis present

## 2024-11-26 DIAGNOSIS — I4891 Unspecified atrial fibrillation: Secondary | ICD-10-CM | POA: Diagnosis present

## 2024-11-26 LAB — POCT INR: POC INR: 2.5

## 2024-11-26 NOTE — Progress Notes (Signed)
 Lab Results  Component Value Date   INR 2.5 11/26/2024   INR 3.0 10/15/2024   INR 3.3 09/17/2024    Description   INR 2.5;   -Continue 1 tablet daily except 1.5 tablets  on Sundays, Tuesdays and Thursdays.  -Recheck INR in 6 weeks.  -Call the Coumadin  Clinic with any upcoming procedures or new medications.  Coumadin  Clinic [phone]

## 2024-11-26 NOTE — Patient Instructions (Signed)
 Description   INR 2.5;   -Continue 1 tablet daily except 1.5 tablets  on Sundays, Tuesdays and Thursdays.  -Recheck INR in 6 weeks.  -Call the Coumadin  Clinic with any upcoming procedures or new medications.  Coumadin  Clinic [phone]

## 2024-11-30 ENCOUNTER — Other Ambulatory Visit: Payer: Self-pay | Admitting: Neurology

## 2024-12-23 ENCOUNTER — Ambulatory Visit: Payer: Self-pay | Admitting: Neurology

## 2025-01-07 ENCOUNTER — Ambulatory Visit

## 2025-01-28 ENCOUNTER — Ambulatory Visit: Admitting: Pulmonary Disease
